# Patient Record
Sex: Female | Born: 1935 | Race: White | Hispanic: No | Smoking: Never smoker
Health system: Southern US, Community
[De-identification: ages and names within clinical notes are randomized; demographics above are authoritative.]

## PROBLEM LIST (undated history)

## (undated) DIAGNOSIS — E785 Hyperlipidemia, unspecified: Secondary | ICD-10-CM

## (undated) DIAGNOSIS — F319 Bipolar disorder, unspecified: Secondary | ICD-10-CM

## (undated) DIAGNOSIS — C50911 Malignant neoplasm of unspecified site of right female breast: Secondary | ICD-10-CM

## (undated) DIAGNOSIS — I480 Paroxysmal atrial fibrillation: Secondary | ICD-10-CM

## (undated) DIAGNOSIS — M545 Low back pain, unspecified: Secondary | ICD-10-CM

## (undated) DIAGNOSIS — G8929 Other chronic pain: Secondary | ICD-10-CM

## (undated) DIAGNOSIS — M1990 Unspecified osteoarthritis, unspecified site: Secondary | ICD-10-CM

## (undated) DIAGNOSIS — I10 Essential (primary) hypertension: Secondary | ICD-10-CM

## (undated) DIAGNOSIS — I509 Heart failure, unspecified: Secondary | ICD-10-CM

## (undated) HISTORY — PX: AUGMENTATION MAMMAPLASTY: SUR837

## (undated) HISTORY — PX: VAGINAL HYSTERECTOMY: SUR661

## (undated) HISTORY — PX: BREAST RECONSTRUCTION: SHX9

## (undated) HISTORY — PX: EXCISIONAL HEMORRHOIDECTOMY: SHX1541

## (undated) HISTORY — PX: BACK SURGERY: SHX140

## (undated) HISTORY — PX: CATARACT EXTRACTION W/ INTRAOCULAR LENS  IMPLANT, BILATERAL: SHX1307

## (undated) HISTORY — PX: MASTECTOMY: SHX3

## (undated) HISTORY — PX: BREAST BIOPSY: SHX20

## (undated) HISTORY — PX: LUMBAR DISC SURGERY: SHX700

## (undated) HISTORY — PX: TONSILLECTOMY: SUR1361

## (undated) HISTORY — PX: PLACEMENT OF BREAST IMPLANTS: SHX6334

---

## 2011-03-14 DIAGNOSIS — G319 Degenerative disease of nervous system, unspecified: Secondary | ICD-10-CM | POA: Diagnosis not present

## 2011-03-14 DIAGNOSIS — G4459 Other complicated headache syndrome: Secondary | ICD-10-CM | POA: Diagnosis not present

## 2011-03-18 DIAGNOSIS — I10 Essential (primary) hypertension: Secondary | ICD-10-CM | POA: Diagnosis not present

## 2011-03-18 DIAGNOSIS — G44009 Cluster headache syndrome, unspecified, not intractable: Secondary | ICD-10-CM | POA: Diagnosis not present

## 2011-03-18 DIAGNOSIS — G509 Disorder of trigeminal nerve, unspecified: Secondary | ICD-10-CM | POA: Diagnosis not present

## 2011-04-03 DIAGNOSIS — N3000 Acute cystitis without hematuria: Secondary | ICD-10-CM | POA: Diagnosis not present

## 2011-04-03 DIAGNOSIS — R1031 Right lower quadrant pain: Secondary | ICD-10-CM | POA: Diagnosis not present

## 2011-04-03 DIAGNOSIS — E0590 Thyrotoxicosis, unspecified without thyrotoxic crisis or storm: Secondary | ICD-10-CM | POA: Diagnosis not present

## 2011-04-03 DIAGNOSIS — N181 Chronic kidney disease, stage 1: Secondary | ICD-10-CM | POA: Diagnosis not present

## 2011-04-24 DIAGNOSIS — G509 Disorder of trigeminal nerve, unspecified: Secondary | ICD-10-CM | POA: Diagnosis not present

## 2011-04-24 DIAGNOSIS — G894 Chronic pain syndrome: Secondary | ICD-10-CM | POA: Diagnosis not present

## 2011-04-24 DIAGNOSIS — Z5181 Encounter for therapeutic drug level monitoring: Secondary | ICD-10-CM | POA: Diagnosis not present

## 2011-04-24 DIAGNOSIS — I10 Essential (primary) hypertension: Secondary | ICD-10-CM | POA: Diagnosis not present

## 2011-04-24 DIAGNOSIS — G44009 Cluster headache syndrome, unspecified, not intractable: Secondary | ICD-10-CM | POA: Diagnosis not present

## 2011-05-06 DIAGNOSIS — M48061 Spinal stenosis, lumbar region without neurogenic claudication: Secondary | ICD-10-CM | POA: Diagnosis not present

## 2011-05-06 DIAGNOSIS — M47817 Spondylosis without myelopathy or radiculopathy, lumbosacral region: Secondary | ICD-10-CM | POA: Diagnosis not present

## 2011-05-06 DIAGNOSIS — M4600 Spinal enthesopathy, site unspecified: Secondary | ICD-10-CM | POA: Diagnosis not present

## 2011-05-06 DIAGNOSIS — M533 Sacrococcygeal disorders, not elsewhere classified: Secondary | ICD-10-CM | POA: Diagnosis not present

## 2011-05-06 DIAGNOSIS — IMO0002 Reserved for concepts with insufficient information to code with codable children: Secondary | ICD-10-CM | POA: Diagnosis not present

## 2011-05-06 DIAGNOSIS — M961 Postlaminectomy syndrome, not elsewhere classified: Secondary | ICD-10-CM | POA: Diagnosis not present

## 2011-05-06 DIAGNOSIS — IMO0001 Reserved for inherently not codable concepts without codable children: Secondary | ICD-10-CM | POA: Diagnosis not present

## 2011-05-06 DIAGNOSIS — M62838 Other muscle spasm: Secondary | ICD-10-CM | POA: Diagnosis not present

## 2011-05-06 DIAGNOSIS — M259 Joint disorder, unspecified: Secondary | ICD-10-CM | POA: Diagnosis not present

## 2011-05-10 DIAGNOSIS — M5380 Other specified dorsopathies, site unspecified: Secondary | ICD-10-CM | POA: Diagnosis not present

## 2011-05-10 DIAGNOSIS — M5382 Other specified dorsopathies, cervical region: Secondary | ICD-10-CM | POA: Diagnosis not present

## 2011-05-10 DIAGNOSIS — M62838 Other muscle spasm: Secondary | ICD-10-CM | POA: Diagnosis not present

## 2011-05-10 DIAGNOSIS — M4600 Spinal enthesopathy, site unspecified: Secondary | ICD-10-CM | POA: Diagnosis not present

## 2011-05-10 DIAGNOSIS — M48061 Spinal stenosis, lumbar region without neurogenic claudication: Secondary | ICD-10-CM | POA: Diagnosis not present

## 2011-05-10 DIAGNOSIS — IMO0001 Reserved for inherently not codable concepts without codable children: Secondary | ICD-10-CM | POA: Diagnosis not present

## 2011-05-10 DIAGNOSIS — M47817 Spondylosis without myelopathy or radiculopathy, lumbosacral region: Secondary | ICD-10-CM | POA: Diagnosis not present

## 2011-05-10 DIAGNOSIS — M961 Postlaminectomy syndrome, not elsewhere classified: Secondary | ICD-10-CM | POA: Diagnosis not present

## 2011-05-22 DIAGNOSIS — M5137 Other intervertebral disc degeneration, lumbosacral region: Secondary | ICD-10-CM | POA: Diagnosis not present

## 2011-05-22 DIAGNOSIS — M519 Unspecified thoracic, thoracolumbar and lumbosacral intervertebral disc disorder: Secondary | ICD-10-CM | POA: Diagnosis not present

## 2011-05-22 DIAGNOSIS — M51379 Other intervertebral disc degeneration, lumbosacral region without mention of lumbar back pain or lower extremity pain: Secondary | ICD-10-CM | POA: Diagnosis not present

## 2011-05-22 DIAGNOSIS — IMO0002 Reserved for concepts with insufficient information to code with codable children: Secondary | ICD-10-CM | POA: Diagnosis not present

## 2011-05-22 DIAGNOSIS — M5430 Sciatica, unspecified side: Secondary | ICD-10-CM | POA: Diagnosis not present

## 2011-05-22 DIAGNOSIS — M47817 Spondylosis without myelopathy or radiculopathy, lumbosacral region: Secondary | ICD-10-CM | POA: Diagnosis not present

## 2011-05-22 DIAGNOSIS — M47814 Spondylosis without myelopathy or radiculopathy, thoracic region: Secondary | ICD-10-CM | POA: Diagnosis not present

## 2011-05-24 DIAGNOSIS — H35379 Puckering of macula, unspecified eye: Secondary | ICD-10-CM | POA: Diagnosis not present

## 2011-05-30 DIAGNOSIS — M5382 Other specified dorsopathies, cervical region: Secondary | ICD-10-CM | POA: Diagnosis not present

## 2011-05-30 DIAGNOSIS — M4802 Spinal stenosis, cervical region: Secondary | ICD-10-CM | POA: Diagnosis not present

## 2011-05-30 DIAGNOSIS — M961 Postlaminectomy syndrome, not elsewhere classified: Secondary | ICD-10-CM | POA: Diagnosis not present

## 2011-05-30 DIAGNOSIS — M47817 Spondylosis without myelopathy or radiculopathy, lumbosacral region: Secondary | ICD-10-CM | POA: Diagnosis not present

## 2011-06-01 DIAGNOSIS — J019 Acute sinusitis, unspecified: Secondary | ICD-10-CM | POA: Diagnosis not present

## 2011-06-12 DIAGNOSIS — M461 Sacroiliitis, not elsewhere classified: Secondary | ICD-10-CM | POA: Diagnosis not present

## 2011-06-12 DIAGNOSIS — M533 Sacrococcygeal disorders, not elsewhere classified: Secondary | ICD-10-CM | POA: Diagnosis not present

## 2011-06-12 DIAGNOSIS — M259 Joint disorder, unspecified: Secondary | ICD-10-CM | POA: Diagnosis not present

## 2011-06-12 DIAGNOSIS — M1610 Unilateral primary osteoarthritis, unspecified hip: Secondary | ICD-10-CM | POA: Diagnosis not present

## 2011-06-20 DIAGNOSIS — G519 Disorder of facial nerve, unspecified: Secondary | ICD-10-CM | POA: Diagnosis not present

## 2011-06-26 DIAGNOSIS — M259 Joint disorder, unspecified: Secondary | ICD-10-CM | POA: Diagnosis not present

## 2011-06-26 DIAGNOSIS — M48061 Spinal stenosis, lumbar region without neurogenic claudication: Secondary | ICD-10-CM | POA: Diagnosis not present

## 2011-06-26 DIAGNOSIS — M4802 Spinal stenosis, cervical region: Secondary | ICD-10-CM | POA: Diagnosis not present

## 2011-06-26 DIAGNOSIS — M4804 Spinal stenosis, thoracic region: Secondary | ICD-10-CM | POA: Diagnosis not present

## 2011-07-09 DIAGNOSIS — F331 Major depressive disorder, recurrent, moderate: Secondary | ICD-10-CM | POA: Diagnosis not present

## 2011-07-12 DIAGNOSIS — H526 Other disorders of refraction: Secondary | ICD-10-CM | POA: Diagnosis not present

## 2011-07-12 DIAGNOSIS — H532 Diplopia: Secondary | ICD-10-CM | POA: Diagnosis not present

## 2011-07-15 DIAGNOSIS — E782 Mixed hyperlipidemia: Secondary | ICD-10-CM | POA: Diagnosis not present

## 2011-07-15 DIAGNOSIS — J189 Pneumonia, unspecified organism: Secondary | ICD-10-CM | POA: Diagnosis not present

## 2011-07-15 DIAGNOSIS — I10 Essential (primary) hypertension: Secondary | ICD-10-CM | POA: Diagnosis not present

## 2011-07-15 DIAGNOSIS — F3011 Manic episode without psychotic symptoms, mild: Secondary | ICD-10-CM | POA: Diagnosis not present

## 2011-07-23 DIAGNOSIS — L03039 Cellulitis of unspecified toe: Secondary | ICD-10-CM | POA: Diagnosis not present

## 2011-08-13 DIAGNOSIS — H532 Diplopia: Secondary | ICD-10-CM | POA: Diagnosis not present

## 2011-08-14 DIAGNOSIS — J189 Pneumonia, unspecified organism: Secondary | ICD-10-CM | POA: Diagnosis not present

## 2011-08-14 DIAGNOSIS — I951 Orthostatic hypotension: Secondary | ICD-10-CM | POA: Diagnosis not present

## 2011-08-14 DIAGNOSIS — I10 Essential (primary) hypertension: Secondary | ICD-10-CM | POA: Diagnosis not present

## 2011-08-14 DIAGNOSIS — F319 Bipolar disorder, unspecified: Secondary | ICD-10-CM | POA: Diagnosis not present

## 2011-09-09 DIAGNOSIS — G509 Disorder of trigeminal nerve, unspecified: Secondary | ICD-10-CM | POA: Diagnosis not present

## 2011-09-09 DIAGNOSIS — G44009 Cluster headache syndrome, unspecified, not intractable: Secondary | ICD-10-CM | POA: Diagnosis not present

## 2011-09-09 DIAGNOSIS — R609 Edema, unspecified: Secondary | ICD-10-CM | POA: Diagnosis not present

## 2011-10-08 DIAGNOSIS — M47814 Spondylosis without myelopathy or radiculopathy, thoracic region: Secondary | ICD-10-CM | POA: Diagnosis not present

## 2011-10-08 DIAGNOSIS — T887XXA Unspecified adverse effect of drug or medicament, initial encounter: Secondary | ICD-10-CM | POA: Diagnosis not present

## 2011-10-08 DIAGNOSIS — R609 Edema, unspecified: Secondary | ICD-10-CM | POA: Diagnosis not present

## 2011-10-08 DIAGNOSIS — G20 Parkinson's disease: Secondary | ICD-10-CM | POA: Diagnosis not present

## 2011-10-29 DIAGNOSIS — L570 Actinic keratosis: Secondary | ICD-10-CM | POA: Diagnosis not present

## 2011-10-29 DIAGNOSIS — L821 Other seborrheic keratosis: Secondary | ICD-10-CM | POA: Diagnosis not present

## 2011-10-29 DIAGNOSIS — D485 Neoplasm of uncertain behavior of skin: Secondary | ICD-10-CM | POA: Diagnosis not present

## 2011-10-29 DIAGNOSIS — I10 Essential (primary) hypertension: Secondary | ICD-10-CM | POA: Diagnosis not present

## 2011-10-29 DIAGNOSIS — M47814 Spondylosis without myelopathy or radiculopathy, thoracic region: Secondary | ICD-10-CM | POA: Diagnosis not present

## 2011-10-29 DIAGNOSIS — G44009 Cluster headache syndrome, unspecified, not intractable: Secondary | ICD-10-CM | POA: Diagnosis not present

## 2011-11-08 DIAGNOSIS — I10 Essential (primary) hypertension: Secondary | ICD-10-CM | POA: Diagnosis not present

## 2011-11-08 DIAGNOSIS — I951 Orthostatic hypotension: Secondary | ICD-10-CM | POA: Diagnosis not present

## 2011-12-10 DIAGNOSIS — R269 Unspecified abnormalities of gait and mobility: Secondary | ICD-10-CM | POA: Diagnosis not present

## 2011-12-16 DIAGNOSIS — G44009 Cluster headache syndrome, unspecified, not intractable: Secondary | ICD-10-CM | POA: Diagnosis not present

## 2011-12-16 DIAGNOSIS — E782 Mixed hyperlipidemia: Secondary | ICD-10-CM | POA: Diagnosis not present

## 2011-12-16 DIAGNOSIS — I951 Orthostatic hypotension: Secondary | ICD-10-CM | POA: Diagnosis not present

## 2011-12-16 DIAGNOSIS — Z23 Encounter for immunization: Secondary | ICD-10-CM | POA: Diagnosis not present

## 2011-12-16 DIAGNOSIS — G509 Disorder of trigeminal nerve, unspecified: Secondary | ICD-10-CM | POA: Diagnosis not present

## 2011-12-20 DIAGNOSIS — G44009 Cluster headache syndrome, unspecified, not intractable: Secondary | ICD-10-CM | POA: Diagnosis not present

## 2011-12-20 DIAGNOSIS — R269 Unspecified abnormalities of gait and mobility: Secondary | ICD-10-CM | POA: Diagnosis not present

## 2011-12-20 DIAGNOSIS — M47814 Spondylosis without myelopathy or radiculopathy, thoracic region: Secondary | ICD-10-CM | POA: Diagnosis not present

## 2011-12-20 DIAGNOSIS — J309 Allergic rhinitis, unspecified: Secondary | ICD-10-CM | POA: Diagnosis not present

## 2011-12-20 DIAGNOSIS — M5126 Other intervertebral disc displacement, lumbar region: Secondary | ICD-10-CM | POA: Diagnosis not present

## 2011-12-25 DIAGNOSIS — Z87891 Personal history of nicotine dependence: Secondary | ICD-10-CM | POA: Diagnosis not present

## 2011-12-25 DIAGNOSIS — E876 Hypokalemia: Secondary | ICD-10-CM | POA: Diagnosis not present

## 2011-12-25 DIAGNOSIS — M4716 Other spondylosis with myelopathy, lumbar region: Secondary | ICD-10-CM | POA: Diagnosis not present

## 2011-12-25 DIAGNOSIS — IMO0002 Reserved for concepts with insufficient information to code with codable children: Secondary | ICD-10-CM | POA: Diagnosis not present

## 2011-12-25 DIAGNOSIS — I674 Hypertensive encephalopathy: Secondary | ICD-10-CM | POA: Diagnosis not present

## 2011-12-25 DIAGNOSIS — E785 Hyperlipidemia, unspecified: Secondary | ICD-10-CM | POA: Diagnosis present

## 2011-12-25 DIAGNOSIS — M810 Age-related osteoporosis without current pathological fracture: Secondary | ICD-10-CM | POA: Diagnosis not present

## 2011-12-25 DIAGNOSIS — G319 Degenerative disease of nervous system, unspecified: Secondary | ICD-10-CM | POA: Diagnosis not present

## 2011-12-25 DIAGNOSIS — Z853 Personal history of malignant neoplasm of breast: Secondary | ICD-10-CM | POA: Diagnosis not present

## 2011-12-25 DIAGNOSIS — F29 Unspecified psychosis not due to a substance or known physiological condition: Secondary | ICD-10-CM | POA: Diagnosis not present

## 2011-12-25 DIAGNOSIS — I10 Essential (primary) hypertension: Secondary | ICD-10-CM | POA: Diagnosis not present

## 2011-12-25 DIAGNOSIS — F0390 Unspecified dementia without behavioral disturbance: Secondary | ICD-10-CM | POA: Diagnosis not present

## 2011-12-25 DIAGNOSIS — F411 Generalized anxiety disorder: Secondary | ICD-10-CM | POA: Diagnosis present

## 2011-12-25 DIAGNOSIS — F319 Bipolar disorder, unspecified: Secondary | ICD-10-CM | POA: Diagnosis not present

## 2011-12-25 DIAGNOSIS — I129 Hypertensive chronic kidney disease with stage 1 through stage 4 chronic kidney disease, or unspecified chronic kidney disease: Secondary | ICD-10-CM | POA: Diagnosis not present

## 2011-12-29 DIAGNOSIS — I129 Hypertensive chronic kidney disease with stage 1 through stage 4 chronic kidney disease, or unspecified chronic kidney disease: Secondary | ICD-10-CM | POA: Diagnosis not present

## 2011-12-29 DIAGNOSIS — E876 Hypokalemia: Secondary | ICD-10-CM | POA: Diagnosis not present

## 2011-12-29 DIAGNOSIS — F0390 Unspecified dementia without behavioral disturbance: Secondary | ICD-10-CM | POA: Diagnosis not present

## 2011-12-29 DIAGNOSIS — M6281 Muscle weakness (generalized): Secondary | ICD-10-CM | POA: Diagnosis not present

## 2011-12-29 DIAGNOSIS — M4716 Other spondylosis with myelopathy, lumbar region: Secondary | ICD-10-CM | POA: Diagnosis not present

## 2011-12-30 DIAGNOSIS — M4716 Other spondylosis with myelopathy, lumbar region: Secondary | ICD-10-CM | POA: Diagnosis not present

## 2011-12-30 DIAGNOSIS — M6281 Muscle weakness (generalized): Secondary | ICD-10-CM | POA: Diagnosis not present

## 2011-12-30 DIAGNOSIS — I129 Hypertensive chronic kidney disease with stage 1 through stage 4 chronic kidney disease, or unspecified chronic kidney disease: Secondary | ICD-10-CM | POA: Diagnosis not present

## 2011-12-30 DIAGNOSIS — F0390 Unspecified dementia without behavioral disturbance: Secondary | ICD-10-CM | POA: Diagnosis not present

## 2011-12-30 DIAGNOSIS — E876 Hypokalemia: Secondary | ICD-10-CM | POA: Diagnosis not present

## 2011-12-31 DIAGNOSIS — M4716 Other spondylosis with myelopathy, lumbar region: Secondary | ICD-10-CM | POA: Diagnosis not present

## 2011-12-31 DIAGNOSIS — I129 Hypertensive chronic kidney disease with stage 1 through stage 4 chronic kidney disease, or unspecified chronic kidney disease: Secondary | ICD-10-CM | POA: Diagnosis not present

## 2011-12-31 DIAGNOSIS — M6281 Muscle weakness (generalized): Secondary | ICD-10-CM | POA: Diagnosis not present

## 2011-12-31 DIAGNOSIS — E876 Hypokalemia: Secondary | ICD-10-CM | POA: Diagnosis not present

## 2011-12-31 DIAGNOSIS — F0390 Unspecified dementia without behavioral disturbance: Secondary | ICD-10-CM | POA: Diagnosis not present

## 2012-01-01 DIAGNOSIS — M6281 Muscle weakness (generalized): Secondary | ICD-10-CM | POA: Diagnosis not present

## 2012-01-01 DIAGNOSIS — F0390 Unspecified dementia without behavioral disturbance: Secondary | ICD-10-CM | POA: Diagnosis not present

## 2012-01-01 DIAGNOSIS — I129 Hypertensive chronic kidney disease with stage 1 through stage 4 chronic kidney disease, or unspecified chronic kidney disease: Secondary | ICD-10-CM | POA: Diagnosis not present

## 2012-01-01 DIAGNOSIS — E876 Hypokalemia: Secondary | ICD-10-CM | POA: Diagnosis not present

## 2012-01-01 DIAGNOSIS — M4716 Other spondylosis with myelopathy, lumbar region: Secondary | ICD-10-CM | POA: Diagnosis not present

## 2012-01-02 DIAGNOSIS — M4716 Other spondylosis with myelopathy, lumbar region: Secondary | ICD-10-CM | POA: Diagnosis not present

## 2012-01-02 DIAGNOSIS — I129 Hypertensive chronic kidney disease with stage 1 through stage 4 chronic kidney disease, or unspecified chronic kidney disease: Secondary | ICD-10-CM | POA: Diagnosis not present

## 2012-01-02 DIAGNOSIS — F0390 Unspecified dementia without behavioral disturbance: Secondary | ICD-10-CM | POA: Diagnosis not present

## 2012-01-02 DIAGNOSIS — M6281 Muscle weakness (generalized): Secondary | ICD-10-CM | POA: Diagnosis not present

## 2012-01-02 DIAGNOSIS — E876 Hypokalemia: Secondary | ICD-10-CM | POA: Diagnosis not present

## 2012-01-06 DIAGNOSIS — I129 Hypertensive chronic kidney disease with stage 1 through stage 4 chronic kidney disease, or unspecified chronic kidney disease: Secondary | ICD-10-CM | POA: Diagnosis not present

## 2012-01-06 DIAGNOSIS — M6281 Muscle weakness (generalized): Secondary | ICD-10-CM | POA: Diagnosis not present

## 2012-01-06 DIAGNOSIS — F0390 Unspecified dementia without behavioral disturbance: Secondary | ICD-10-CM | POA: Diagnosis not present

## 2012-01-06 DIAGNOSIS — E876 Hypokalemia: Secondary | ICD-10-CM | POA: Diagnosis not present

## 2012-01-06 DIAGNOSIS — M4716 Other spondylosis with myelopathy, lumbar region: Secondary | ICD-10-CM | POA: Diagnosis not present

## 2012-01-07 DIAGNOSIS — E876 Hypokalemia: Secondary | ICD-10-CM | POA: Diagnosis not present

## 2012-01-07 DIAGNOSIS — M4716 Other spondylosis with myelopathy, lumbar region: Secondary | ICD-10-CM | POA: Diagnosis not present

## 2012-01-07 DIAGNOSIS — I10 Essential (primary) hypertension: Secondary | ICD-10-CM | POA: Diagnosis not present

## 2012-01-07 DIAGNOSIS — M6281 Muscle weakness (generalized): Secondary | ICD-10-CM | POA: Diagnosis not present

## 2012-01-07 DIAGNOSIS — R609 Edema, unspecified: Secondary | ICD-10-CM | POA: Diagnosis not present

## 2012-01-07 DIAGNOSIS — M47814 Spondylosis without myelopathy or radiculopathy, thoracic region: Secondary | ICD-10-CM | POA: Diagnosis not present

## 2012-01-07 DIAGNOSIS — G44009 Cluster headache syndrome, unspecified, not intractable: Secondary | ICD-10-CM | POA: Diagnosis not present

## 2012-01-07 DIAGNOSIS — M064 Inflammatory polyarthropathy: Secondary | ICD-10-CM | POA: Diagnosis not present

## 2012-01-07 DIAGNOSIS — F0390 Unspecified dementia without behavioral disturbance: Secondary | ICD-10-CM | POA: Diagnosis not present

## 2012-01-07 DIAGNOSIS — I129 Hypertensive chronic kidney disease with stage 1 through stage 4 chronic kidney disease, or unspecified chronic kidney disease: Secondary | ICD-10-CM | POA: Diagnosis not present

## 2012-01-08 DIAGNOSIS — E876 Hypokalemia: Secondary | ICD-10-CM | POA: Diagnosis not present

## 2012-01-08 DIAGNOSIS — M6281 Muscle weakness (generalized): Secondary | ICD-10-CM | POA: Diagnosis not present

## 2012-01-08 DIAGNOSIS — M4716 Other spondylosis with myelopathy, lumbar region: Secondary | ICD-10-CM | POA: Diagnosis not present

## 2012-01-08 DIAGNOSIS — I129 Hypertensive chronic kidney disease with stage 1 through stage 4 chronic kidney disease, or unspecified chronic kidney disease: Secondary | ICD-10-CM | POA: Diagnosis not present

## 2012-01-08 DIAGNOSIS — F0390 Unspecified dementia without behavioral disturbance: Secondary | ICD-10-CM | POA: Diagnosis not present

## 2012-01-09 DIAGNOSIS — M4716 Other spondylosis with myelopathy, lumbar region: Secondary | ICD-10-CM | POA: Diagnosis not present

## 2012-01-09 DIAGNOSIS — I129 Hypertensive chronic kidney disease with stage 1 through stage 4 chronic kidney disease, or unspecified chronic kidney disease: Secondary | ICD-10-CM | POA: Diagnosis not present

## 2012-01-09 DIAGNOSIS — E876 Hypokalemia: Secondary | ICD-10-CM | POA: Diagnosis not present

## 2012-01-09 DIAGNOSIS — M6281 Muscle weakness (generalized): Secondary | ICD-10-CM | POA: Diagnosis not present

## 2012-01-09 DIAGNOSIS — F0390 Unspecified dementia without behavioral disturbance: Secondary | ICD-10-CM | POA: Diagnosis not present

## 2012-01-10 DIAGNOSIS — E876 Hypokalemia: Secondary | ICD-10-CM | POA: Diagnosis not present

## 2012-01-10 DIAGNOSIS — M4716 Other spondylosis with myelopathy, lumbar region: Secondary | ICD-10-CM | POA: Diagnosis not present

## 2012-01-10 DIAGNOSIS — F0390 Unspecified dementia without behavioral disturbance: Secondary | ICD-10-CM | POA: Diagnosis not present

## 2012-01-10 DIAGNOSIS — I129 Hypertensive chronic kidney disease with stage 1 through stage 4 chronic kidney disease, or unspecified chronic kidney disease: Secondary | ICD-10-CM | POA: Diagnosis not present

## 2012-01-10 DIAGNOSIS — M6281 Muscle weakness (generalized): Secondary | ICD-10-CM | POA: Diagnosis not present

## 2012-01-13 DIAGNOSIS — E876 Hypokalemia: Secondary | ICD-10-CM | POA: Diagnosis not present

## 2012-01-13 DIAGNOSIS — M4716 Other spondylosis with myelopathy, lumbar region: Secondary | ICD-10-CM | POA: Diagnosis not present

## 2012-01-13 DIAGNOSIS — I129 Hypertensive chronic kidney disease with stage 1 through stage 4 chronic kidney disease, or unspecified chronic kidney disease: Secondary | ICD-10-CM | POA: Diagnosis not present

## 2012-01-13 DIAGNOSIS — M6281 Muscle weakness (generalized): Secondary | ICD-10-CM | POA: Diagnosis not present

## 2012-01-13 DIAGNOSIS — F0390 Unspecified dementia without behavioral disturbance: Secondary | ICD-10-CM | POA: Diagnosis not present

## 2012-01-17 DIAGNOSIS — M6281 Muscle weakness (generalized): Secondary | ICD-10-CM | POA: Diagnosis not present

## 2012-01-17 DIAGNOSIS — E876 Hypokalemia: Secondary | ICD-10-CM | POA: Diagnosis not present

## 2012-01-17 DIAGNOSIS — I129 Hypertensive chronic kidney disease with stage 1 through stage 4 chronic kidney disease, or unspecified chronic kidney disease: Secondary | ICD-10-CM | POA: Diagnosis not present

## 2012-01-17 DIAGNOSIS — M4716 Other spondylosis with myelopathy, lumbar region: Secondary | ICD-10-CM | POA: Diagnosis not present

## 2012-01-17 DIAGNOSIS — F0390 Unspecified dementia without behavioral disturbance: Secondary | ICD-10-CM | POA: Diagnosis not present

## 2012-01-20 DIAGNOSIS — M6281 Muscle weakness (generalized): Secondary | ICD-10-CM | POA: Diagnosis not present

## 2012-01-20 DIAGNOSIS — F0390 Unspecified dementia without behavioral disturbance: Secondary | ICD-10-CM | POA: Diagnosis not present

## 2012-01-20 DIAGNOSIS — E876 Hypokalemia: Secondary | ICD-10-CM | POA: Diagnosis not present

## 2012-01-20 DIAGNOSIS — M4716 Other spondylosis with myelopathy, lumbar region: Secondary | ICD-10-CM | POA: Diagnosis not present

## 2012-01-20 DIAGNOSIS — I129 Hypertensive chronic kidney disease with stage 1 through stage 4 chronic kidney disease, or unspecified chronic kidney disease: Secondary | ICD-10-CM | POA: Diagnosis not present

## 2012-01-23 DIAGNOSIS — I129 Hypertensive chronic kidney disease with stage 1 through stage 4 chronic kidney disease, or unspecified chronic kidney disease: Secondary | ICD-10-CM | POA: Diagnosis not present

## 2012-01-23 DIAGNOSIS — M6281 Muscle weakness (generalized): Secondary | ICD-10-CM | POA: Diagnosis not present

## 2012-01-23 DIAGNOSIS — M4716 Other spondylosis with myelopathy, lumbar region: Secondary | ICD-10-CM | POA: Diagnosis not present

## 2012-01-23 DIAGNOSIS — E876 Hypokalemia: Secondary | ICD-10-CM | POA: Diagnosis not present

## 2012-01-23 DIAGNOSIS — F0390 Unspecified dementia without behavioral disturbance: Secondary | ICD-10-CM | POA: Diagnosis not present

## 2012-04-10 DIAGNOSIS — M47814 Spondylosis without myelopathy or radiculopathy, thoracic region: Secondary | ICD-10-CM | POA: Diagnosis not present

## 2012-04-10 DIAGNOSIS — I10 Essential (primary) hypertension: Secondary | ICD-10-CM | POA: Diagnosis not present

## 2012-04-10 DIAGNOSIS — G509 Disorder of trigeminal nerve, unspecified: Secondary | ICD-10-CM | POA: Diagnosis not present

## 2012-04-10 DIAGNOSIS — M1710 Unilateral primary osteoarthritis, unspecified knee: Secondary | ICD-10-CM | POA: Diagnosis not present

## 2012-04-10 DIAGNOSIS — I951 Orthostatic hypotension: Secondary | ICD-10-CM | POA: Diagnosis not present

## 2012-04-21 DIAGNOSIS — R609 Edema, unspecified: Secondary | ICD-10-CM | POA: Diagnosis not present

## 2012-04-21 DIAGNOSIS — M47814 Spondylosis without myelopathy or radiculopathy, thoracic region: Secondary | ICD-10-CM | POA: Diagnosis not present

## 2012-04-21 DIAGNOSIS — R269 Unspecified abnormalities of gait and mobility: Secondary | ICD-10-CM | POA: Diagnosis not present

## 2012-04-21 DIAGNOSIS — M1710 Unilateral primary osteoarthritis, unspecified knee: Secondary | ICD-10-CM | POA: Diagnosis not present

## 2012-04-21 DIAGNOSIS — I951 Orthostatic hypotension: Secondary | ICD-10-CM | POA: Diagnosis not present

## 2012-06-03 DIAGNOSIS — M5126 Other intervertebral disc displacement, lumbar region: Secondary | ICD-10-CM | POA: Diagnosis not present

## 2012-06-03 DIAGNOSIS — Z111 Encounter for screening for respiratory tuberculosis: Secondary | ICD-10-CM | POA: Diagnosis not present

## 2012-06-03 DIAGNOSIS — M810 Age-related osteoporosis without current pathological fracture: Secondary | ICD-10-CM | POA: Diagnosis not present

## 2012-06-03 DIAGNOSIS — I10 Essential (primary) hypertension: Secondary | ICD-10-CM | POA: Diagnosis not present

## 2012-06-03 DIAGNOSIS — G44009 Cluster headache syndrome, unspecified, not intractable: Secondary | ICD-10-CM | POA: Diagnosis not present

## 2012-06-03 DIAGNOSIS — R609 Edema, unspecified: Secondary | ICD-10-CM | POA: Diagnosis not present

## 2012-06-03 DIAGNOSIS — R269 Unspecified abnormalities of gait and mobility: Secondary | ICD-10-CM | POA: Diagnosis not present

## 2012-06-05 DIAGNOSIS — H6120 Impacted cerumen, unspecified ear: Secondary | ICD-10-CM | POA: Diagnosis not present

## 2012-07-29 DIAGNOSIS — M7750 Other enthesopathy of unspecified foot: Secondary | ICD-10-CM | POA: Diagnosis not present

## 2012-07-29 DIAGNOSIS — M722 Plantar fascial fibromatosis: Secondary | ICD-10-CM | POA: Diagnosis not present

## 2012-08-05 DIAGNOSIS — M722 Plantar fascial fibromatosis: Secondary | ICD-10-CM | POA: Diagnosis not present

## 2012-08-05 DIAGNOSIS — L600 Ingrowing nail: Secondary | ICD-10-CM | POA: Diagnosis not present

## 2012-08-18 ENCOUNTER — Telehealth: Payer: Self-pay | Admitting: Internal Medicine

## 2012-08-18 NOTE — Telephone Encounter (Signed)
Ok but again would refer to [facility] or Dr Selena Batten if they have availability/interest

## 2012-08-18 NOTE — Telephone Encounter (Signed)
Pt is new to area, from [place], [place]. Just moved here 2 weeks ago.  Would like to know if you will accept as a new patient? Pt has [employer], supplemental [employer].

## 2012-08-28 ENCOUNTER — Ambulatory Visit: Payer: Medicare Other

## 2012-08-31 DIAGNOSIS — M722 Plantar fascial fibromatosis: Secondary | ICD-10-CM | POA: Diagnosis not present

## 2012-09-14 ENCOUNTER — Encounter: Payer: Self-pay | Admitting: Internal Medicine

## 2012-09-14 ENCOUNTER — Ambulatory Visit (INDEPENDENT_AMBULATORY_CARE_PROVIDER_SITE_OTHER): Payer: Medicare Other | Admitting: Internal Medicine

## 2012-09-14 VITALS — BP 130/70 | HR 61 | Temp 97.4°F | Resp 18 | Ht 65.5 in | Wt 156.0 lb

## 2012-09-14 DIAGNOSIS — Z0000 Encounter for general adult medical examination without abnormal findings: Secondary | ICD-10-CM | POA: Diagnosis not present

## 2012-09-14 DIAGNOSIS — F319 Bipolar disorder, unspecified: Secondary | ICD-10-CM

## 2012-09-14 DIAGNOSIS — M545 Low back pain, unspecified: Secondary | ICD-10-CM

## 2012-09-14 DIAGNOSIS — G8929 Other chronic pain: Secondary | ICD-10-CM | POA: Diagnosis not present

## 2012-09-14 DIAGNOSIS — M549 Dorsalgia, unspecified: Secondary | ICD-10-CM

## 2012-09-14 MED ORDER — FUROSEMIDE 40 MG PO TABS: 40.0000 mg | ORAL_TABLET | Freq: Every day | ORAL | Status: DC

## 2012-09-14 MED ORDER — GABAPENTIN 300 MG PO CAPS: 300.0000 mg | ORAL_CAPSULE | Freq: Two times a day (BID) | ORAL | Status: DC

## 2012-09-14 MED ORDER — ESCITALOPRAM OXALATE 10 MG PO TABS: 10.0000 mg | ORAL_TABLET | Freq: Every day | ORAL | Status: DC

## 2012-09-14 MED ORDER — NABUMETONE 750 MG PO TABS: 750.0000 mg | ORAL_TABLET | Freq: Two times a day (BID) | ORAL | Status: DC

## 2012-09-14 MED ORDER — TRAMADOL HCL 50 MG PO TABS: 100.0000 mg | ORAL_TABLET | Freq: Three times a day (TID) | ORAL | Status: DC | PRN

## 2012-09-14 MED ORDER — QUETIAPINE FUMARATE 25 MG PO TABS: 25.0000 mg | ORAL_TABLET | Freq: Three times a day (TID) | ORAL | Status: DC

## 2012-09-14 MED ORDER — TRAZODONE HCL 150 MG PO TABS: 150.0000 mg | ORAL_TABLET | Freq: Every day | ORAL | Status: DC

## 2012-09-14 MED ORDER — ESTRADIOL 1 MG PO TABS: 1.0000 mg | ORAL_TABLET | Freq: Every day | ORAL | Status: DC

## 2012-09-14 NOTE — Progress Notes (Signed)
Subjective:    Patient ID: Tracy Rodriguez, female    DOB: 12/06/1931, 79 y.o.   MRN: [id number]  HPI   79 year old patient who is seen today to set [place] with our practice. She is recently widowed about 4 months ago and has relocated to [place]  from [facility].  Past medical history  Remote breast cancer status post partial mastectomy 1981 Hysterectomy 1971 Cataract extraction surgery Chronic low back pain status post decompression 1972 1973, 1985  allergic rhinitis Bipolar disorder History of plantar fasciitis  Allergies sulfa and codeine  Social history widowed retired worked with the [employer] 2 daughters one grandson Relocated from [place] Nonsmoker  Family history father died at 47 [place] Mother died age 7 status post CVA history of breast cancer at 65 One brother died at 30 of leukemia One sister is well  1. Risk factors, based on past  M,S,F history- no significant cardiovascular risk factors except age   2.  Physical activities: Chronic low back pain but no activity restrictions. Walks unassisted  3.  Depression/mood: History bipolar depression  4.  Hearing: No deficits  5.  ADL's: Requires assistance in aspects of daily living. Presently resides at a rest home  6.  Fall risk: Low  7.  Home safety: No problems identified  8.  Height weight, and visual acuity; height and weight stable no change in visual acuity has had bilateral cataract extraction surgery  9.  Counseling: Heart healthy diet rectus eyes encouraged  10. Lab orders based on risk factors: Will review of prior medical records  11. Referral : Not appropriate at this time  12. Care plan: Continue present regimen reviewed the past medical records  13. Cognitive assessment: Alert in order with normal affect. No cognitive dysfunction     Review of Systems  Constitutional: Negative for fever, appetite change, fatigue and unexpected weight change.  HENT: Negative for  hearing loss, ear pain, nosebleeds, congestion, sore throat, mouth sores, trouble swallowing, neck stiffness, dental problem, voice change, sinus pressure and tinnitus.   Eyes: Negative for photophobia, pain, redness and visual disturbance.  Respiratory: Negative for cough, chest tightness and shortness of breath.   Cardiovascular: Negative for chest pain, palpitations and leg swelling.  Gastrointestinal: Negative for nausea, vomiting, abdominal pain, diarrhea, constipation, blood in stool, abdominal distention and rectal pain.  Genitourinary: Negative for dysuria, urgency, frequency, hematuria, flank pain, vaginal bleeding, vaginal discharge, difficulty urinating, genital sores, vaginal pain, menstrual problem and pelvic pain.  Musculoskeletal: Positive for back pain, arthralgias and gait problem.       Right plantar fasciitis  Skin: Negative for rash.  Neurological: Negative for dizziness, syncope, speech difficulty, weakness, light-headedness, numbness and headaches.  Hematological: Negative for adenopathy. Does not bruise/bleed easily.  Psychiatric/Behavioral: Negative for suicidal ideas, behavioral problems, self-injury, dysphoric mood and agitation. The patient is not nervous/anxious.        Objective:   Physical Exam  Constitutional: She is oriented to person, place, and time. She appears well-developed and well-nourished.  HENT:  Head: Normocephalic.  Right Ear: External ear normal.  Left Ear: External ear normal.  Mouth/Throat: Oropharynx is clear and moist.  Eyes: Conjunctivae and EOM are normal. Pupils are equal, round, and reactive to light.  Neck: Normal range of motion. Neck supple. No thyromegaly present.  Cardiovascular: Normal rate, regular rhythm, normal heart sounds and intact distal pulses.   Pulmonary/Chest: Effort normal and breath sounds normal.  Abdominal: Soft. Bowel sounds are normal. She exhibits no  mass. There is no tenderness.  Musculoskeletal: Normal range of  motion.  Lymphadenopathy:    She has no cervical adenopathy.  Neurological: She is alert and oriented to person, place, and time.  Skin: Skin is warm and dry. No rash noted.  Psychiatric: She has a normal mood and affect. Her behavior is normal.          Assessment & Plan:  Preventive health exam Bipolar disorder History menopausal syndrome. We'll attempt to taper and discontinue hormone replacement therapy Chronic low back pain. Medications refilled

## 2012-09-14 NOTE — Patient Instructions (Addendum)
Limit your sodium (Salt) intake  Taper and discontinue Estrace  It is important that you exercise regularly, at least 20 minutes 3 to 4 times per week.  If you develop chest pain or shortness of breath seek  medical attention.  Return in 6 months for follow-up

## 2012-09-24 DIAGNOSIS — M722 Plantar fascial fibromatosis: Secondary | ICD-10-CM | POA: Diagnosis not present

## 2012-10-23 ENCOUNTER — Ambulatory Visit (INDEPENDENT_AMBULATORY_CARE_PROVIDER_SITE_OTHER): Payer: Medicare Other | Admitting: Internal Medicine

## 2012-10-23 ENCOUNTER — Encounter: Payer: Self-pay | Admitting: Internal Medicine

## 2012-10-23 VITALS — BP 140/80 | HR 61 | Temp 97.7°F | Resp 20 | Wt 160.0 lb

## 2012-10-23 DIAGNOSIS — R197 Diarrhea, unspecified: Secondary | ICD-10-CM

## 2012-10-23 MED ORDER — DIPHENOXYLATE-ATROPINE 2.5-0.025 MG PO TABS: 1.0000 | ORAL_TABLET | Freq: Four times a day (QID) | ORAL | Status: DC | PRN

## 2012-10-23 NOTE — Progress Notes (Signed)
Subjective:    Patient ID: Tracy Rodriguez, female    DOB: 14-Jun-1931, 79 y.o.   MRN: [id number]  HPI  79 year old patient who is seen today with a chief complaint of diarrhea. The past 2 weeks she has had loose stools that occur about 4 times per day. No nausea vomiting abdominal pain anorexia fever or other constitutional complaints. There has been no weight loss. She describes the diarrhea as being a nuisance.   History reviewed. No pertinent past medical history.  History   Social History  . Marital Status: Widowed    Spouse Name: N/A    Number of Children: N/A  . Years of Education: N/A   Occupational History  . Not on file.   Social History Main Topics  . Smoking status: Never Smoker   . Smokeless tobacco: Never Used  . Alcohol Use: No  . Drug Use: No  . Sexual Activity: Not on file   Other Topics Concern  . Not on file   Social History Narrative  . No narrative on file    History reviewed. No pertinent past surgical history.  No family history on file.  Allergies  Allergen Reactions  . Codeine Anaphylaxis  . Sulfa Antibiotics Rash    Current Outpatient Prescriptions on File Prior to Visit  Medication Sig Dispense Refill  . escitalopram (LEXAPRO) 10 MG tablet Take 1 tablet (10 mg total) by mouth daily.  90 tablet  1  . estradiol (ESTRACE) 1 MG tablet Take 1 tablet (1 mg total) by mouth daily.  90 tablet  3  . furosemide (LASIX) 40 MG tablet Take 1 tablet (40 mg total) by mouth daily.  90 tablet  3  . gabapentin (NEURONTIN) 300 MG capsule Take 1 capsule (300 mg total) by mouth 2 (two) times daily.  180 capsule  3  . nabumetone (RELAFEN) 750 MG tablet Take 1 tablet (750 mg total) by mouth 2 (two) times daily.  180 tablet  3  . pseudoephedrine (SUDAFED) 30 MG tablet Take 30 mg by mouth as needed for congestion.      . QUEtiapine (SEROQUEL) 25 MG tablet Take 1 tablet (25 mg total) by mouth 3 (three) times daily.  270 tablet  3  . traMADol (ULTRAM) 50 MG tablet Take  2 tablets (100 mg total) by mouth every 8 (eight) hours as needed.  90 tablet  1  . traZODone (DESYREL) 150 MG tablet Take 1 tablet (150 mg total) by mouth at bedtime.  90 tablet  1   No current facility-administered medications on file prior to visit.    BP 140/80  Pulse 61  Temp(Src) 97.7 F (36.5 C) (Oral)  Resp 20  Wt 160 lb (72.576 kg)  BMI 26.21 kg/m2  SpO2 95%  LMP 10/10/1969     Wt Readings from Last 3 Encounters:  10/23/12 160 lb (72.576 kg)  09/14/12 156 lb (70.761 kg)    Review of Systems  Constitutional: Negative.   HENT: Negative for hearing loss, congestion, sore throat, rhinorrhea, dental problem, sinus pressure and tinnitus.   Eyes: Negative for pain, discharge and visual disturbance.  Respiratory: Negative for cough and shortness of breath.   Cardiovascular: Negative for chest pain, palpitations and leg swelling.  Gastrointestinal: Positive for diarrhea. Negative for nausea, vomiting, abdominal pain, constipation, blood in stool and abdominal distention.  Genitourinary: Negative for dysuria, urgency, frequency, hematuria, flank pain, vaginal bleeding, vaginal discharge, difficulty urinating, vaginal pain and pelvic pain.  Musculoskeletal: Negative for joint swelling, arthralgias  and gait problem.  Skin: Negative for rash.  Neurological: Negative for dizziness, syncope, speech difficulty, weakness, numbness and headaches.  Hematological: Negative for adenopathy.  Psychiatric/Behavioral: Negative for behavioral problems, dysphoric mood and agitation. The patient is not nervous/anxious.        Objective:   Physical Exam  Constitutional: She is oriented to person, place, and time. She appears well-developed and well-nourished.  HENT:  Head: Normocephalic.  Right Ear: External ear normal.  Left Ear: External ear normal.  Mouth/Throat: Oropharynx is clear and moist.  Eyes: Conjunctivae and EOM are normal. Pupils are equal, round, and reactive to light.   Neck: Normal range of motion. Neck supple. No thyromegaly present.  Cardiovascular: Normal rate, regular rhythm, normal heart sounds and intact distal pulses.   Pulmonary/Chest: Effort normal and breath sounds normal.  Abdominal: Soft. Bowel sounds are normal. She exhibits no distension and no mass. There is no tenderness. There is no rebound and no guarding.  Musculoskeletal: Normal range of motion.  Lymphadenopathy:    She has no cervical adenopathy.  Neurological: She is alert and oriented to person, place, and time.  Skin: Skin is warm and dry. No rash noted.  Psychiatric: She has a normal mood and affect. Her behavior is normal.          Assessment & Plan:  Diarrhea Normal clinical exam  We'll treat symptomatically with Lomotil. Will increase fiber in her diet and add a probiotic for one week. She'll call if unimproved

## 2012-10-23 NOTE — Patient Instructions (Signed)
Metamucil supplement twice daily  Align  one daily  Antidiarrheal as directed

## 2012-11-05 ENCOUNTER — Other Ambulatory Visit: Payer: Self-pay | Admitting: Internal Medicine

## 2012-11-20 ENCOUNTER — Ambulatory Visit (INDEPENDENT_AMBULATORY_CARE_PROVIDER_SITE_OTHER): Payer: Medicare Other | Admitting: Internal Medicine

## 2012-11-20 ENCOUNTER — Encounter: Payer: Self-pay | Admitting: Internal Medicine

## 2012-11-20 ENCOUNTER — Ambulatory Visit (INDEPENDENT_AMBULATORY_CARE_PROVIDER_SITE_OTHER)
Admission: RE | Admit: 2012-11-20 | Discharge: 2012-11-20 | Disposition: A | Discharge status: Home / Self Care | Payer: Medicare Other | Source: Ambulatory Visit | Attending: Internal Medicine | Admitting: Internal Medicine

## 2012-11-20 VITALS — BP 154/80 | HR 65 | Temp 97.3°F | Resp 20 | Wt 164.0 lb

## 2012-11-20 DIAGNOSIS — R609 Edema, unspecified: Secondary | ICD-10-CM

## 2012-11-20 DIAGNOSIS — R0602 Shortness of breath: Secondary | ICD-10-CM

## 2012-11-20 DIAGNOSIS — G8929 Other chronic pain: Secondary | ICD-10-CM | POA: Diagnosis not present

## 2012-11-20 DIAGNOSIS — R0989 Other specified symptoms and signs involving the circulatory and respiratory systems: Secondary | ICD-10-CM | POA: Diagnosis not present

## 2012-11-20 DIAGNOSIS — M549 Dorsalgia, unspecified: Secondary | ICD-10-CM | POA: Diagnosis not present

## 2012-11-20 LAB — CBC WITH DIFFERENTIAL/PLATELET
Basophils Absolute: 0 10*3/uL (ref 0.0–0.1)
Basophils Relative: 0.4 % (ref 0.0–3.0)
Eosinophils Absolute: 0.2 10*3/uL (ref 0.0–0.7)
Eosinophils Relative: 3.2 % (ref 0.0–5.0)
HCT: 34.7 % — ABNORMAL LOW (ref 36.0–46.0)
Hemoglobin: 11.5 g/dL — ABNORMAL LOW (ref 12.0–15.0)
Lymphocytes Relative: 18.1 % (ref 12.0–46.0)
Lymphs Abs: 1.1 10*3/uL (ref 0.7–4.0)
MCHC: 33.2 g/dL (ref 30.0–36.0)
MCV: 88.7 fl (ref 78.0–100.0)
Monocytes Absolute: 0.6 10*3/uL (ref 0.1–1.0)
Monocytes Relative: 9.7 % (ref 3.0–12.0)
Neutro Abs: 4.1 10*3/uL (ref 1.4–7.7)
Neutrophils Relative %: 68.6 % (ref 43.0–77.0)
Platelets: 185 10*3/uL (ref 150.0–400.0)
RBC: 3.91 Mil/uL (ref 3.87–5.11)
RDW: 14.6 % (ref 11.5–14.6)
WBC: 6 10*3/uL (ref 4.5–10.5)

## 2012-11-20 LAB — COMPREHENSIVE METABOLIC PANEL
ALT: 12 U/L (ref 0–35)
AST: 21 U/L (ref 0–37)
Albumin: 3.4 g/dL — ABNORMAL LOW (ref 3.5–5.2)
Alkaline Phosphatase: 72 U/L (ref 39–117)
BUN: 25 mg/dL — ABNORMAL HIGH (ref 6–23)
CO2: 29 mEq/L (ref 19–32)
Calcium: 9.1 mg/dL (ref 8.4–10.5)
Chloride: 106 mEq/L (ref 96–112)
Creatinine, Ser: 1.4 mg/dL — ABNORMAL HIGH (ref 0.4–1.2)
GFR: 37.87 mL/min — ABNORMAL LOW (ref >60.00)
Glucose, Bld: 73 mg/dL (ref 70–99)
Potassium: 3.6 mEq/L (ref 3.5–5.1)
Sodium: 140 mEq/L (ref 135–145)
Total Bilirubin: 1.2 mg/dL (ref 0.3–1.2)
Total Protein: 6.8 g/dL (ref 6.0–8.3)

## 2012-11-20 LAB — TSH: TSH: 2.55 u[IU]/mL (ref 0.35–5.50)

## 2012-11-20 LAB — BRAIN NATRIURETIC PEPTIDE: Pro B Natriuretic peptide (BNP): 416 pg/mL — ABNORMAL HIGH (ref 0.0–100.0)

## 2012-11-20 NOTE — Patient Instructions (Addendum)
Limit your sodium (Salt) intake  Call or return to clinic prn if these symptoms worsen or fail to improve as anticipated.   

## 2012-11-20 NOTE — Progress Notes (Signed)
Subjective:    Patient ID: Tracy Rodriguez, female    DOB: 04-29-1931, 79 y.o.   MRN: [id number]  HPI  79 year old patient who is seen today for followup. Past 4 weeks she has had some increasing intermittent pedal edema. She has been on chronic Lasix and this has also been a chronic problem. For the past 3 weeks there has been more shortness of breath. She states this occurs usually when she takes a nap and lies flat following lunch. She has a history chronic low back pain and no history of cardiac disease.  Wt Readings from Last 3 Encounters:  11/20/12 164 lb (74.39 kg)  10/23/12 160 lb (72.576 kg)  09/14/12 156 lb (70.761 kg)    History reviewed. No pertinent past medical history.  History   Social History  . Marital Status: Widowed    Spouse Name: N/A    Number of Children: N/A  . Years of Education: N/A   Occupational History  . Not on file.   Social History Main Topics  . Smoking status: Never Smoker   . Smokeless tobacco: Never Used  . Alcohol Use: No  . Drug Use: No  . Sexual Activity: Not on file   Other Topics Concern  . Not on file   Social History Narrative  . No narrative on file    History reviewed. No pertinent past surgical history.  No family history on file.  Allergies  Allergen Reactions  . Codeine Anaphylaxis  . Sulfa Antibiotics Rash    Current Outpatient Prescriptions on File Prior to Visit  Medication Sig Dispense Refill  . diphenoxylate-atropine (LOMOTIL) 2.5-0.025 MG per tablet Take 1 tablet by mouth 4 (four) times daily as needed for diarrhea or loose stools.  30 tablet  0  . escitalopram (LEXAPRO) 10 MG tablet Take 1 tablet (10 mg total) by mouth daily.  90 tablet  1  . furosemide (LASIX) 40 MG tablet Take 1 tablet (40 mg total) by mouth daily.  90 tablet  3  . gabapentin (NEURONTIN) 300 MG capsule Take 1 capsule (300 mg total) by mouth 2 (two) times daily.  180 capsule  3  . nabumetone (RELAFEN) 750 MG tablet Take 1 tablet (750 mg  total) by mouth 2 (two) times daily.  180 tablet  3  . pseudoephedrine (SUDAFED) 30 MG tablet Take 30 mg by mouth as needed for congestion.      . QUEtiapine (SEROQUEL) 25 MG tablet Take 1 tablet (25 mg total) by mouth 3 (three) times daily.  270 tablet  3  . traMADol (ULTRAM) 50 MG tablet TAKE 2 TABLETS BY MOUTH EVERY 8 HOURS AS NEEDED  90 tablet  1  . traZODone (DESYREL) 150 MG tablet Take 1 tablet (150 mg total) by mouth at bedtime.  90 tablet  1   No current facility-administered medications on file prior to visit.    BP 154/80  Pulse 65  Temp(Src) 97.3 F (36.3 C) (Oral)  Resp 20  Wt 164 lb (74.39 kg)  BMI 26.87 kg/m2  SpO2 97%  LMP 10/10/1969     Review of Systems  Constitutional: Negative.   HENT: Negative for hearing loss, congestion, sore throat, rhinorrhea, dental problem, sinus pressure and tinnitus.   Eyes: Negative for pain, discharge and visual disturbance.  Respiratory: Positive for shortness of breath. Negative for cough.   Cardiovascular: Positive for leg swelling. Negative for chest pain and palpitations.  Gastrointestinal: Negative for nausea, vomiting, abdominal pain, diarrhea, constipation, blood in stool  and abdominal distention.  Genitourinary: Negative for dysuria, urgency, frequency, hematuria, flank pain, vaginal bleeding, vaginal discharge, difficulty urinating, vaginal pain and pelvic pain.  Musculoskeletal: Negative for joint swelling, arthralgias and gait problem.  Skin: Negative for rash.  Neurological: Negative for dizziness, syncope, speech difficulty, weakness, numbness and headaches.  Hematological: Negative for adenopathy.  Psychiatric/Behavioral: Negative for behavioral problems, dysphoric mood and agitation. The patient is not nervous/anxious.        Objective:   Physical Exam  Constitutional: She is oriented to person, place, and time. She appears well-developed and well-nourished.  HENT:  Head: Normocephalic.  Right Ear: External ear  normal.  Left Ear: External ear normal.  Mouth/Throat: Oropharynx is clear and moist.  Eyes: Conjunctivae and EOM are normal. Pupils are equal, round, and reactive to light.  Neck: Normal range of motion. Neck supple. No thyromegaly present.  Cardiovascular: Normal rate, regular rhythm, normal heart sounds and intact distal pulses.   Pulse slow and regular No murmur or gallop  Pulmonary/Chest: Effort normal and breath sounds normal.  O2 saturation 97  Abdominal: Soft. Bowel sounds are normal. She exhibits no mass. There is no tenderness.  Musculoskeletal: Normal range of motion. She exhibits no edema.  No significant pedal edema at this late morning appointment  Lymphadenopathy:    She has no cervical adenopathy.  Neurological: She is alert and oriented to person, place, and time.  Skin: Skin is warm and dry. No rash noted.  Psychiatric: She has a normal mood and affect. Her behavior is normal.          Assessment & Plan:   History shortness of breath and worsening pedal edema Weight gain Nothing  to support a diagnosis of heart failure on clinical exam. We'll check screening labs chest x-ray and BNP. Low-salt diet recommended Chronic low back pain

## 2012-11-24 DIAGNOSIS — R279 Unspecified lack of coordination: Secondary | ICD-10-CM | POA: Diagnosis not present

## 2012-11-24 DIAGNOSIS — M6281 Muscle weakness (generalized): Secondary | ICD-10-CM | POA: Diagnosis not present

## 2012-11-26 DIAGNOSIS — R279 Unspecified lack of coordination: Secondary | ICD-10-CM | POA: Diagnosis not present

## 2012-11-26 DIAGNOSIS — M6281 Muscle weakness (generalized): Secondary | ICD-10-CM | POA: Diagnosis not present

## 2012-11-27 ENCOUNTER — Other Ambulatory Visit: Payer: Self-pay | Admitting: Internal Medicine

## 2012-11-30 DIAGNOSIS — M6281 Muscle weakness (generalized): Secondary | ICD-10-CM | POA: Diagnosis not present

## 2012-11-30 DIAGNOSIS — R279 Unspecified lack of coordination: Secondary | ICD-10-CM | POA: Diagnosis not present

## 2012-12-02 DIAGNOSIS — R279 Unspecified lack of coordination: Secondary | ICD-10-CM | POA: Diagnosis not present

## 2012-12-02 DIAGNOSIS — M6281 Muscle weakness (generalized): Secondary | ICD-10-CM | POA: Diagnosis not present

## 2012-12-04 DIAGNOSIS — R279 Unspecified lack of coordination: Secondary | ICD-10-CM | POA: Diagnosis not present

## 2012-12-04 DIAGNOSIS — M6281 Muscle weakness (generalized): Secondary | ICD-10-CM | POA: Diagnosis not present

## 2012-12-07 DIAGNOSIS — R279 Unspecified lack of coordination: Secondary | ICD-10-CM | POA: Diagnosis not present

## 2012-12-07 DIAGNOSIS — M6281 Muscle weakness (generalized): Secondary | ICD-10-CM | POA: Diagnosis not present

## 2012-12-08 DIAGNOSIS — R279 Unspecified lack of coordination: Secondary | ICD-10-CM | POA: Diagnosis not present

## 2012-12-08 DIAGNOSIS — M6281 Muscle weakness (generalized): Secondary | ICD-10-CM | POA: Diagnosis not present

## 2012-12-11 DIAGNOSIS — M6281 Muscle weakness (generalized): Secondary | ICD-10-CM | POA: Diagnosis not present

## 2012-12-11 DIAGNOSIS — Z23 Encounter for immunization: Secondary | ICD-10-CM | POA: Diagnosis not present

## 2012-12-11 DIAGNOSIS — R279 Unspecified lack of coordination: Secondary | ICD-10-CM | POA: Diagnosis not present

## 2012-12-15 DIAGNOSIS — M6281 Muscle weakness (generalized): Secondary | ICD-10-CM | POA: Diagnosis not present

## 2012-12-15 DIAGNOSIS — R279 Unspecified lack of coordination: Secondary | ICD-10-CM | POA: Diagnosis not present

## 2012-12-17 DIAGNOSIS — R279 Unspecified lack of coordination: Secondary | ICD-10-CM | POA: Diagnosis not present

## 2012-12-17 DIAGNOSIS — M6281 Muscle weakness (generalized): Secondary | ICD-10-CM | POA: Diagnosis not present

## 2012-12-18 DIAGNOSIS — M6281 Muscle weakness (generalized): Secondary | ICD-10-CM | POA: Diagnosis not present

## 2012-12-18 DIAGNOSIS — R279 Unspecified lack of coordination: Secondary | ICD-10-CM | POA: Diagnosis not present

## 2012-12-30 DIAGNOSIS — M6281 Muscle weakness (generalized): Secondary | ICD-10-CM | POA: Diagnosis not present

## 2012-12-30 DIAGNOSIS — R279 Unspecified lack of coordination: Secondary | ICD-10-CM | POA: Diagnosis not present

## 2013-01-01 DIAGNOSIS — R279 Unspecified lack of coordination: Secondary | ICD-10-CM | POA: Diagnosis not present

## 2013-01-01 DIAGNOSIS — M6281 Muscle weakness (generalized): Secondary | ICD-10-CM | POA: Diagnosis not present

## 2013-01-05 DIAGNOSIS — M6281 Muscle weakness (generalized): Secondary | ICD-10-CM | POA: Diagnosis not present

## 2013-01-05 DIAGNOSIS — R279 Unspecified lack of coordination: Secondary | ICD-10-CM | POA: Diagnosis not present

## 2013-01-07 DIAGNOSIS — R279 Unspecified lack of coordination: Secondary | ICD-10-CM | POA: Diagnosis not present

## 2013-01-07 DIAGNOSIS — M6281 Muscle weakness (generalized): Secondary | ICD-10-CM | POA: Diagnosis not present

## 2013-01-11 DIAGNOSIS — M6281 Muscle weakness (generalized): Secondary | ICD-10-CM | POA: Diagnosis not present

## 2013-01-11 DIAGNOSIS — R279 Unspecified lack of coordination: Secondary | ICD-10-CM | POA: Diagnosis not present

## 2013-01-11 DIAGNOSIS — Z1379 Encounter for other screening for genetic and chromosomal anomalies: Secondary | ICD-10-CM | POA: Diagnosis not present

## 2013-01-18 DIAGNOSIS — H1044 Vernal conjunctivitis: Secondary | ICD-10-CM | POA: Diagnosis not present

## 2013-02-09 DIAGNOSIS — M6281 Muscle weakness (generalized): Secondary | ICD-10-CM | POA: Diagnosis not present

## 2013-02-09 DIAGNOSIS — R279 Unspecified lack of coordination: Secondary | ICD-10-CM | POA: Diagnosis not present

## 2013-02-11 DIAGNOSIS — R279 Unspecified lack of coordination: Secondary | ICD-10-CM | POA: Diagnosis not present

## 2013-02-11 DIAGNOSIS — M6281 Muscle weakness (generalized): Secondary | ICD-10-CM | POA: Diagnosis not present

## 2013-03-17 ENCOUNTER — Encounter: Payer: Self-pay | Admitting: *Deleted

## 2013-03-18 ENCOUNTER — Ambulatory Visit: Payer: Medicare Other | Admitting: Internal Medicine

## 2013-03-29 ENCOUNTER — Other Ambulatory Visit: Payer: Self-pay | Admitting: Internal Medicine

## 2013-03-29 ENCOUNTER — Encounter: Payer: Self-pay | Admitting: Internal Medicine

## 2013-03-29 ENCOUNTER — Ambulatory Visit (INDEPENDENT_AMBULATORY_CARE_PROVIDER_SITE_OTHER): Payer: Medicare Other | Admitting: Internal Medicine

## 2013-03-29 VITALS — BP 140/80 | HR 73 | Temp 97.6°F | Resp 20 | Ht 65.5 in | Wt 171.0 lb

## 2013-03-29 DIAGNOSIS — Z23 Encounter for immunization: Secondary | ICD-10-CM

## 2013-03-29 DIAGNOSIS — R600 Localized edema: Secondary | ICD-10-CM

## 2013-03-29 DIAGNOSIS — R609 Edema, unspecified: Secondary | ICD-10-CM

## 2013-03-29 MED ORDER — TRAZODONE HCL 150 MG PO TABS: 150.0000 mg | ORAL_TABLET | Freq: Every day | ORAL | Status: DC

## 2013-03-29 NOTE — Patient Instructions (Addendum)
Limit your sodium (Salt) intake  Keep your legs elevated as much as possible  Increase furosemide to twice daily for the next 3 days and then resume once daily in the morning2 Gram Low Sodium Diet A 2 gram sodium diet restricts the amount of sodium in the diet to no more than 2 g or 2000 mg daily. Limiting the amount of sodium is often used to help lower blood pressure. It is important if you have heart, liver, or kidney problems. Many foods contain sodium for flavor and sometimes as a preservative. When the amount of sodium in a diet needs to be low, it is important to know what to look for when choosing foods and drinks. The following includes some information and guidelines to help make it easier for you to adapt to a low sodium diet. QUICK TIPS  Do not add salt to food.  Avoid convenience items and fast food.  Choose unsalted snack foods.  Buy lower sodium products, often labeled as "lower sodium" or "no salt added."  Check food labels to learn how much sodium is in 1 serving.  When eating at a restaurant, ask that your food be prepared with less salt or none, if possible. READING FOOD LABELS FOR SODIUM INFORMATION The nutrition facts label is a good place to find how much sodium is in foods. Look for products with no more than 500 to 600 mg of sodium per meal and no more than 150 mg per serving. Remember that 2 g = 2000 mg. The food label may also list foods as:  Sodium-free: Less than 5 mg in a serving.  Very low sodium: 35 mg or less in a serving.  Low-sodium: 140 mg or less in a serving.  Light in sodium: 50% less sodium in a serving. For example, if a food that usually has 300 mg of sodium is changed to become light in sodium, it will have 150 mg of sodium.  Reduced sodium: 25% less sodium in a serving. For example, if a food that usually has 400 mg of sodium is changed to reduced sodium, it will have 300 mg of sodium. CHOOSING FOODS Grains  Avoid: Salted crackers and  snack items. Some cereals, including instant hot cereals. Bread stuffing and biscuit mixes. Seasoned rice or pasta mixes.  Choose: Unsalted snack items. Low-sodium cereals, oats, puffed wheat and rice, shredded wheat. English muffins and bread. Pasta. Meats  Avoid: Salted, canned, smoked, spiced, pickled meats, including fish and poultry. Bacon, ham, sausage, cold cuts, hot dogs, anchovies.  Choose: Low-sodium canned tuna and salmon. Fresh or frozen meat, poultry, and fish. Dairy  Avoid: Processed cheese and spreads. Cottage cheese. Buttermilk and condensed milk. Regular cheese.  Choose: Milk. Low-sodium cottage cheese. Yogurt. Sour cream. Low-sodium cheese. Fruits and Vegetables  Avoid: Regular canned vegetables. Regular canned tomato sauce and paste. Frozen vegetables in sauces. Olives. Tracy Rodriguez. Relishes. Sauerkraut.  Choose: Low-sodium canned vegetables. Low-sodium tomato sauce and paste. Frozen or fresh vegetables. Fresh and frozen fruit. Condiments  Avoid: Canned and packaged gravies. Worcestershire sauce. Tartar sauce. Barbecue sauce. Soy sauce. Steak sauce. Ketchup. Onion, garlic, and table salt. Meat flavorings and tenderizers.  Choose: Fresh and dried herbs and spices. Low-sodium varieties of mustard and ketchup. Lemon juice. Tabasco sauce. Horseradish. SAMPLE 2 GRAM SODIUM MEAL PLAN Breakfast / Sodium (mg)  1 cup low-fat milk / [id number] mg  2 slices whole-wheat toast / 270 mg  1 tbs heart-healthy margarine / 153 mg  1 hard-boiled egg / 139  mg  1 small orange / 0 mg Lunch / Sodium (mg)  1 cup raw carrots / 76 mg   cup hummus / 298 mg  1 cup low-fat milk / 143 mg   cup red grapes / 2 mg  1 whole-wheat pita bread / 356 mg Dinner / Sodium (mg)  1 cup whole-wheat pasta / 2 mg  1 cup low-sodium tomato sauce / 73 mg  3 oz lean ground beef / 57 mg  1 small side salad (1 cup raw spinach leaves,  cup cucumber,  cup yellow bell pepper) with 1 tsp olive oil and 1  tsp red wine vinegar / 25 mg Snack / Sodium (mg)  1 container low-fat vanilla yogurt / 107 mg  3 graham cracker squares / 127 mg Nutrient Analysis  Calories: 2033  Protein: 77 g  Carbohydrate: 282 g  Fat: 72 g  Sodium: 1971 mg Document Released: 02/25/2005 Document Revised: 05/20/2011 Document Reviewed: 05/29/2009 [employer] Patient Information 2014 [place]. Seasoning Without Salt Seasoning without salt decreases the amount of sodium in the diet. This can be done by using less salt or no salt, and eating foods with less sodium. Lowering the amount of sodium is often used to help lower blood pressure, and is important for heart failure, liver and kidney problems. Many foods contain sodium for flavor and sometimes, it is added as a preservative. When the amount of sodium in a diet needs to be low, it is important to know what to look for when choosing foods and drinks.  TIPS TO LOWER THE SODIUM IN YOUR DIET  Do not add salt to food.  Check food labels to learn how much sodium is in one serving. Look for products with no more than 150 mg of sodium in one serving.  Avoid convenience items and fast food that are often high in sodium.  Choose unsalted snack foods.  Buy lower sodium products often labeled as "lower sodium" or "no salt added."  When eating at a restaurant, ask that your food be prepared with less salt, or none if possible.  Check medicines such as antacids for sodium content. Ask your pharmacist if you are not sure.  Use herbs, seasonings without salt, and spices as substitutes for salt in foods. SALT ALTERNATIVES Listed below are herbs, seasonings and spices that can be used as alternatives to salt for giving taste to foods. Next to their names are foods they can be used to flavor.  Herbs  Bay - Soups, meat and vegetable dishes and spaghetti sauce.  Basil - [employer], soups, pasta and fish dishes.  Cilantro - Meat, poultry and vegetable  dishes.  Chili Powder - Marinades and Mexican dishes.  Cumin - Mexican dishes, couscous and meat dishes.  Dill - Fish dishes, sauces, and salads.  Fennel - Meat and vegetable dishes, breads, cookies.  Marjoram - Soups, potato dishes and meat dishes.  Oregano - Pizza and spaghetti sauce.  Parsley - Salads, soups, pasta and meat dishes.  Rosemary - [place] dishes, salad dressings, soups, and red meats.  Saffron - Fish dishes, pasta, some poultry dishes.  Sage - Stuffings and sauces.  Tarragon - Fish and [employer].  Thyme - Stuffing, meat and fish dishes. Seasonings  Chives - Salad dressings and potato dishes.  Garlic (NOT garlic salt) - [place] dishes, meat dishes, salad dressings, and sauces.  Lemon juice - Fish dishes, poultry dishes, and salads.  Vinegar - Salad dressings, vegetables, and fish dishes. Spices  Cinnamon - Used in sweet dishes; cakes, cookies, and puddings.  Cloves - Gingerbread, puddings, and marinades for meats.  Curry - Vegetable dishes, fish and poultry dishes, and stir fry dishes.  Ginger - Vegetables dishes, fish dishes, and stir fry dishes.  Nutmeg - Pasta, vegetables, poultry, fish dishes, and custard. AVOIDING FOODS HIGH IN SODIUM Avoid the following foods and condiments, as they are high in sodium.  Barbecue sauce.  Horseradish.  Soy sauce.  Teriyaki sauce.  Steak sauce.  Chili sauce.  Cocktail sauce.  Tartar sauce.  Salsa.  Packaged gravies.  Tracy Rodriguez.  Olives.  Sauerkraut.  Salted nuts.  Canned soups. Document Released: 02/25/2005 Document Revised: 05/20/2011 Document Reviewed: 07/11/2008 [facility] Patient Information 2014 [place].

## 2013-03-29 NOTE — Progress Notes (Signed)
Subjective:    Patient ID: Tracy Rodriguez, female    DOB: 02-24-1932, 80 y.o.   MRN: [id number]  HPI  80 year old patient who is seen today for her by annual followup. She has a history of chronic low back pain but has no concerns today. She also has a history of intermittent lower extremity edema and has been on chronic furosemide 40 mg every morning. This was her chief complaints 6 months ago. Evaluation at that time revealed no clinical edema and laboratory screen fairly unremarkable. She states that she has had increase in swelling involving the feet and ankles for the past 4 weeks. She also complains of a weight gain since a resident of [place].  History reviewed. No pertinent past medical history.  History   Social History  . Marital Status: Widowed    Spouse Name: N/A    Number of Children: N/A  . Years of Education: N/A   Occupational History  . Not on file.   Social History Main Topics  . Smoking status: Never Smoker   . Smokeless tobacco: Never Used  . Alcohol Use: No  . Drug Use: No  . Sexual Activity: Not on file   Other Topics Concern  . Not on file   Social History Narrative  . No narrative on file    History reviewed. No pertinent past surgical history.  No family history on file.  Allergies  Allergen Reactions  . Codeine Anaphylaxis  . Sulfa Antibiotics Rash    Current Outpatient Prescriptions on File Prior to Visit  Medication Sig Dispense Refill  . escitalopram (LEXAPRO) 10 MG tablet Take 1 tablet (10 mg total) by mouth daily.  90 tablet  1  . furosemide (LASIX) 40 MG tablet Take 1 tablet (40 mg total) by mouth daily.  90 tablet  3  . gabapentin (NEURONTIN) 300 MG capsule Take 1 capsule (300 mg total) by mouth 2 (two) times daily.  180 capsule  3  . nabumetone (RELAFEN) 750 MG tablet Take 1 tablet (750 mg total) by mouth 2 (two) times daily.  180 tablet  3  . pseudoephedrine (SUDAFED) 30 MG tablet Take 30 mg by mouth as needed for congestion.       . QUEtiapine (SEROQUEL) 25 MG tablet Take 1 tablet (25 mg total) by mouth 3 (three) times daily.  270 tablet  3  . traMADol (ULTRAM) 50 MG tablet TAKE 2 TABLETS BY MOUTH EVERY 8 HOURS  180 tablet  2  . diphenoxylate-atropine (LOMOTIL) 2.5-0.025 MG per tablet Take 1 tablet by mouth 4 (four) times daily as needed for diarrhea or loose stools.  30 tablet  0   No current facility-administered medications on file prior to visit.    BP 140/80  Pulse 73  Temp(Src) 97.6 F (36.4 C) (Oral)  Resp 20  Ht 5' 5.5" (1.664 m)  Wt 171 lb (77.565 kg)  BMI 28.01 kg/m2  SpO2 97%  LMP 10/10/1969     Review of Systems  Constitutional: Negative.   HENT: Negative for congestion, dental problem, hearing loss, rhinorrhea, sinus pressure, sore throat and tinnitus.   Eyes: Negative for pain, discharge and visual disturbance.  Respiratory: Negative for cough and shortness of breath.   Cardiovascular: Positive for leg swelling. Negative for chest pain and palpitations.  Gastrointestinal: Negative for nausea, vomiting, abdominal pain, diarrhea, constipation, blood in stool and abdominal distention.  Genitourinary: Negative for dysuria, urgency, frequency, hematuria, flank pain, vaginal bleeding, vaginal discharge, difficulty urinating, vaginal pain and  pelvic pain.  Musculoskeletal: Negative for arthralgias, gait problem and joint swelling.  Skin: Negative for rash.  Neurological: Negative for dizziness, syncope, speech difficulty, weakness, numbness and headaches.  Hematological: Negative for adenopathy.  Psychiatric/Behavioral: Negative for behavioral problems, dysphoric mood and agitation. The patient is not nervous/anxious.        Objective:   Physical Exam  Constitutional: She is oriented to person, place, and time. She appears well-developed and well-nourished.  HENT:  Head: Normocephalic.  Right Ear: External ear normal.  Left Ear: External ear normal.  Mouth/Throat: Oropharynx is clear and  moist.  Eyes: Conjunctivae and EOM are normal. Pupils are equal, round, and reactive to light.  Neck: Normal range of motion. Neck supple. No thyromegaly present.  Cardiovascular: Normal rate, regular rhythm, normal heart sounds and intact distal pulses.   Pulmonary/Chest: Effort normal and breath sounds normal.  Abdominal: Soft. Bowel sounds are normal. She exhibits no mass. There is no tenderness.  Musculoskeletal: Normal range of motion.  Bilateral ankle and pedal edema  Lymphadenopathy:    She has no cervical adenopathy.  Neurological: She is alert and oriented to person, place, and time.  Skin: Skin is warm and dry. No rash noted.  Psychiatric: She has a normal mood and affect. Her behavior is normal.          Assessment & Plan:   Recurrent pedal edema. We'll attempt to tighten up on her salt intake. Information was dispensed. She is a resident at [facility] and probably has early control over her salt intake. We'll increase [place] to 40 mg twice a day for 3 days  CPX 6 months  History chronic low back pain. Asymptomatic today

## 2013-03-29 NOTE — Progress Notes (Signed)
Pre-visit discussion using our clinic review tool. No additional management support is needed unless otherwise documented below in the visit note.  

## 2013-04-02 ENCOUNTER — Other Ambulatory Visit: Payer: Self-pay | Admitting: Internal Medicine

## 2013-05-06 ENCOUNTER — Other Ambulatory Visit: Payer: Self-pay | Admitting: Internal Medicine

## 2013-05-07 ENCOUNTER — Telehealth: Payer: Self-pay | Admitting: Internal Medicine

## 2013-05-07 MED ORDER — TRAMADOL HCL 50 MG PO TABS: ORAL_TABLET | ORAL | Status: DC

## 2013-05-07 NOTE — Telephone Encounter (Signed)
Pt needs re-fill of traMADol (ULTRAM) 50 MG tablet

## 2013-05-07 NOTE — Telephone Encounter (Signed)
Rx called in 

## 2013-07-19 ENCOUNTER — Other Ambulatory Visit: Payer: Self-pay | Admitting: Internal Medicine

## 2013-07-20 ENCOUNTER — Telehealth: Payer: Self-pay | Admitting: Internal Medicine

## 2013-07-20 NOTE — Telephone Encounter (Signed)
Patient Information:  Caller Name: Tracy Rodriguez  Phone: [phone]  Patient: Tracy Rodriguez, Tracy Rodriguez  Gender: Female  DOB: 23-Apr-1931  Age: 80 Years  PCP: Bluford Kaufmann (Family Practice > 45yrs old)  Office Follow Up:  Does the office need to follow up with this patient?: No  Instructions For The Office: N/A  RN Note:  Patient was scheduled for appt tomorrow at 14:00 , 07/21/13.   Encouraged her to take afternoon Lasix dose as advised by physician, mointor salt intake.  Caller given home care advice and call back parameters. Understanding expressed.  Symptoms  Reason For Call & Symptoms: Patient states her legs and feet are swollen , ongoing x2 weeks.  Area from toes to knees but reports due to being overweight it is difficult tell.  Hands are slightly swollen.  She does NOT weigh.  She is currently on Lasix 40mg   for chronic Edema . Her lasix was increased to 40mg  bid  in January 2015 at her annual visit due to Edema. she states she sometimes forgets to take the afternoon dose. Last time she took prescribed dosage was "a few days ago">    The edema does go away at night and returns during the day.  Today, she has walked the hallway , to dining hall and rest of time she is reading with legs elevated on sofa.   She is a resident [facility] History In EMR: Yes  Reviewed Medications In EMR: Yes  Reviewed Allergies In EMR: Yes  Reviewed Surgeries / Procedures: Yes  Date of Onset of Symptoms: 07/06/2013  Guideline(s) Used:  Leg Swelling and Edema  Disposition Per Guideline:   See Today in Office  Reason For Disposition Reached:   Patient wants to be seen  Advice Given:  Varicose Veins - Treatment:  Try to rest and elevate your legs above your heart a couple times each day for 15 minutes.  Walking is good for your blood flow (Reason: helps pump the blood out of the veins).  Avoid prolonged standing in one place.  Support hose may be helpful. Put them on first thing in the  morning when the swelling is least.  Call Back If:  Swelling becomes worse  Swelling becomes red or painful to the touch  Calf pain occurs and becomes constant  You become worse.  Patient Will Follow Care Advice:  YES  Appointment Scheduled:  07/21/2013 14:00:00 Appointment Scheduled Provider:  Other

## 2013-07-21 ENCOUNTER — Ambulatory Visit (INDEPENDENT_AMBULATORY_CARE_PROVIDER_SITE_OTHER): Payer: Medicare Other | Admitting: Internal Medicine

## 2013-07-21 ENCOUNTER — Encounter: Payer: Self-pay | Admitting: Internal Medicine

## 2013-07-21 VITALS — BP 138/80 | HR 81 | Temp 97.9°F | Resp 20 | Ht 65.5 in | Wt 180.0 lb

## 2013-07-21 DIAGNOSIS — R609 Edema, unspecified: Secondary | ICD-10-CM | POA: Diagnosis not present

## 2013-07-21 DIAGNOSIS — R600 Localized edema: Secondary | ICD-10-CM

## 2013-07-21 NOTE — Progress Notes (Signed)
Pre-visit discussion using our clinic review tool. No additional management support is needed unless otherwise documented below in the visit note.  

## 2013-07-21 NOTE — Patient Instructions (Signed)
Limit your sodium (Salt) intake    It is important that you exercise regularly, at least 20 minutes 3 to 4 times per week.  If you develop chest pain or shortness of breath seek  medical attention.  You need to lose weight.  Consider a lower calorie diet and regular exercise.  Return in 6 months for follow-up   

## 2013-07-21 NOTE — Progress Notes (Signed)
Subjective:    Patient ID: Tracy Rodriguez, female    DOB: 1931/08/15, 80 y.o.   MRN: [id number]  HPI  80 year old patient who is seen today with a chief complaint of lower extremity edema of 2 weeks duration.  This has been a chronic intermittent compliance since she established with this office.  She is on daily, furosemide.  Since relocating to [place] she has had a steady, progressive weight gain and now at 180 pounds.  She states when she first arrived to [place].  Her weight was 130 pounds.  She consumes ice cream daily  History reviewed. No pertinent past medical history.  History   Social History  . Marital Status: Widowed    Spouse Name: N/A    Number of Children: N/A  . Years of Education: N/A   Occupational History  . Not on file.   Social History Main Topics  . Smoking status: Never Smoker   . Smokeless tobacco: Never Used  . Alcohol Use: No  . Drug Use: No  . Sexual Activity: Not on file   Other Topics Concern  . Not on file   Social History Narrative  . No narrative on file    History reviewed. No pertinent past surgical history.  No family history on file.  Allergies  Allergen Reactions  . Codeine Anaphylaxis  . Sulfa Antibiotics Rash    Current Outpatient Prescriptions on File Prior to Visit  Medication Sig Dispense Refill  . escitalopram (LEXAPRO) 10 MG tablet TAKE 1 TABLET BY MOUTH ONCE DAILY  90 tablet  0  . furosemide (LASIX) 40 MG tablet Take 1 tablet (40 mg total) by mouth daily.  90 tablet  3  . gabapentin (NEURONTIN) 300 MG capsule Take 1 capsule (300 mg total) by mouth 2 (two) times daily.  180 capsule  3  . nabumetone (RELAFEN) 750 MG tablet Take 1 tablet (750 mg total) by mouth 2 (two) times daily.  180 tablet  3  . pseudoephedrine (SUDAFED) 30 MG tablet Take 30 mg by mouth as needed for congestion.      . QUEtiapine (SEROQUEL) 25 MG tablet Take 1 tablet (25 mg total) by mouth 3 (three) times daily.  270 tablet  3  . traMADol  (ULTRAM) 50 MG tablet TAKE 2 TABLETS BY MOUTH EVERY 8 HOURS  180 tablet  0  . traZODone (DESYREL) 150 MG tablet Take 1 tablet (150 mg total) by mouth at bedtime.  90 tablet  1   No current facility-administered medications on file prior to visit.    BP 138/80  Pulse 81  Temp(Src) 97.9 F (36.6 C) (Oral)  Resp 20  Ht 5' 5.5" (1.664 m)  Wt 180 lb (81.647 kg)  BMI 29.49 kg/m2  SpO2 95%  LMP 10/10/1969     Wt Readings from Last 3 Encounters:  07/21/13 180 lb (81.647 kg)  03/29/13 171 lb (77.565 kg)  11/20/12 164 lb (74.39 kg)    Review of Systems  Constitutional: Negative.   HENT: Negative for congestion, dental problem, hearing loss, rhinorrhea, sinus pressure, sore throat and tinnitus.   Eyes: Negative for pain, discharge and visual disturbance.  Respiratory: Negative for cough and shortness of breath.   Cardiovascular: Positive for leg swelling. Negative for chest pain and palpitations.  Gastrointestinal: Negative for nausea, vomiting, abdominal pain, diarrhea, constipation, blood in stool and abdominal distention.  Genitourinary: Negative for dysuria, urgency, frequency, hematuria, flank pain, vaginal bleeding, vaginal discharge, difficulty urinating, vaginal pain and pelvic pain.  Musculoskeletal: Negative for arthralgias, gait problem and joint swelling.  Skin: Negative for rash.  Neurological: Negative for dizziness, syncope, speech difficulty, weakness, numbness and headaches.  Hematological: Negative for adenopathy.  Psychiatric/Behavioral: Negative for behavioral problems, dysphoric mood and agitation. The patient is not nervous/anxious.        Objective:   Physical Exam  Constitutional: She is oriented to person, place, and time. She appears well-developed and well-nourished.  HENT:  Head: Normocephalic.  Right Ear: External ear normal.  Left Ear: External ear normal.  Mouth/Throat: Oropharynx is clear and moist.  Eyes: Conjunctivae and EOM are normal. Pupils  are equal, round, and reactive to light.  Neck: Normal range of motion. Neck supple. No thyromegaly present.  Cardiovascular: Normal rate, regular rhythm, normal heart sounds and intact distal pulses.   Pulmonary/Chest: Effort normal and breath sounds normal.  Abdominal: Soft. Bowel sounds are normal. She exhibits no mass. There is no tenderness.  Musculoskeletal: Normal range of motion. She exhibits no edema.  No obvious lower extremity edema  Lymphadenopathy:    She has no cervical adenopathy.  Neurological: She is alert and oriented to person, place, and time.  Skin: Skin is warm and dry. No rash noted.  Psychiatric: She has a normal mood and affect. Her behavior is normal.          Assessment & Plan:   Weight gain History of pedal edema  Weight loss with more vigorous activity and walking, as well as calorie restriction discussed and encouraged Low-salt diet recommended

## 2013-08-02 ENCOUNTER — Other Ambulatory Visit: Payer: Self-pay | Admitting: Internal Medicine

## 2013-08-03 ENCOUNTER — Other Ambulatory Visit: Payer: Self-pay

## 2013-08-03 MED ORDER — TRAMADOL HCL 50 MG PO TABS: ORAL_TABLET | ORAL | Status: DC

## 2013-08-04 ENCOUNTER — Telehealth: Payer: Self-pay | Admitting: Internal Medicine

## 2013-08-04 MED ORDER — TRAMADOL HCL 50 MG PO TABS: 50.0000 mg | ORAL_TABLET | Freq: Three times a day (TID) | ORAL | Status: DC | PRN

## 2013-08-04 NOTE — Telephone Encounter (Signed)
Pt req rx on traMADol (ULTRAM) 50 MG tablet

## 2013-08-04 NOTE — Telephone Encounter (Signed)
Spoke to pt told her Rx called into pharmacy. Pt verbalized understanding.

## 2013-09-19 ENCOUNTER — Other Ambulatory Visit: Payer: Self-pay | Admitting: Internal Medicine

## 2013-09-24 ENCOUNTER — Telehealth: Payer: Self-pay | Admitting: Internal Medicine

## 2013-09-24 MED ORDER — TRAMADOL HCL 50 MG PO TABS: 50.0000 mg | ORAL_TABLET | Freq: Three times a day (TID) | ORAL | Status: DC | PRN

## 2013-09-24 NOTE — Telephone Encounter (Signed)
Spoke to pt told her will call Rx into pharmacy. Rx done

## 2013-09-24 NOTE — Telephone Encounter (Signed)
Pt requesting refill of traMADol (ULTRAM) 50 MG tablet # 30.  Pharmacy:  [facility]

## 2013-09-27 ENCOUNTER — Telehealth: Payer: Self-pay | Admitting: Internal Medicine

## 2013-09-27 ENCOUNTER — Encounter: Payer: Self-pay | Admitting: Internal Medicine

## 2013-09-27 ENCOUNTER — Ambulatory Visit (INDEPENDENT_AMBULATORY_CARE_PROVIDER_SITE_OTHER): Payer: Medicare Other | Admitting: Internal Medicine

## 2013-09-27 VITALS — BP 170/90 | HR 89 | Temp 97.4°F | Resp 20 | Ht 65.5 in | Wt 180.0 lb

## 2013-09-27 DIAGNOSIS — E669 Obesity, unspecified: Secondary | ICD-10-CM

## 2013-09-27 MED ORDER — TRAMADOL HCL 50 MG PO TABS: 50.0000 mg | ORAL_TABLET | Freq: Four times a day (QID) | ORAL | Status: DC

## 2013-09-27 NOTE — Telephone Encounter (Signed)
Pt has an appointment today will discuss at appointment.

## 2013-09-27 NOTE — Progress Notes (Signed)
Pre visit review using our clinic review tool, if applicable. No additional management support is needed unless otherwise documented below in the visit note. 

## 2013-09-27 NOTE — Patient Instructions (Signed)
Limit your sodium (Salt) intake    It is important that you exercise regularly, at least 20 minutes 3 to 4 times per week.  If you develop chest pain or shortness of breath seek  medical attention.  You need to lose weight.  Consider a lower calorie diet and regular exercise.  Return in 6 months for follow-up   

## 2013-09-27 NOTE — Progress Notes (Signed)
Subjective:    Patient ID: Tracy Rodriguez, female    DOB: 1931-12-14, 80 y.o.   MRN: [id number]  HPI  80 year old patient who is in today for followup.  She has a history of bipolar disorder, as well as chronic low back pain.  She has been out of tramadol since she has been taking 2 twice a day.  Her prescription has been written for one every 8 hours when necessary. Bipolar depression has been stable. Her weight also has been stable although still at 200 pounds.  She still consumes ice cream frequently, but has cut back a bit;  when she arrived at [facility].  Her weight was 130 pounds  History reviewed. No pertinent past medical history.  History   Social History  . Marital Status: Widowed    Spouse Name: N/A    Number of Children: N/A  . Years of Education: N/A   Occupational History  . Not on file.   Social History Main Topics  . Smoking status: Never Smoker   . Smokeless tobacco: Never Used  . Alcohol Use: No  . Drug Use: No  . Sexual Activity: Not on file   Other Topics Concern  . Not on file   Social History Narrative  . No narrative on file    History reviewed. No pertinent past surgical history.  No family history on file.  Allergies  Allergen Reactions  . Codeine Anaphylaxis  . Sulfa Antibiotics Rash    Current Outpatient Prescriptions on File Prior to Visit  Medication Sig Dispense Refill  . escitalopram (LEXAPRO) 10 MG tablet TAKE 1 TABLET BY MOUTH ONCE DAILY  90 tablet  0  . furosemide (LASIX) 40 MG tablet Take 1 tablet (40 mg total) by mouth daily.  90 tablet  3  . gabapentin (NEURONTIN) 300 MG capsule TAKE ONE CAPSULE BY MOUTH TWICE DAILY  180 capsule  3  . nabumetone (RELAFEN) 750 MG tablet Take 1 tablet (750 mg total) by mouth 2 (two) times daily.  180 tablet  3  . pseudoephedrine (SUDAFED) 30 MG tablet Take 30 mg by mouth as needed for congestion.      . QUEtiapine (SEROQUEL) 25 MG tablet Take 1 tablet (25 mg total) by mouth 3 (three) times  daily.  270 tablet  3  . traMADol (ULTRAM) 50 MG tablet Take 1 tablet (50 mg total) by mouth every 8 (eight) hours as needed for moderate pain.  90 tablet  2  . traZODone (DESYREL) 150 MG tablet Take 1 tablet (150 mg total) by mouth at bedtime.  90 tablet  1   No current facility-administered medications on file prior to visit.    BP 170/90  Pulse 89  Temp(Src) 97.4 F (36.3 C) (Oral)  Resp 20  Ht 5' 5.5" (1.664 m)  Wt 180 lb (81.647 kg)  BMI 29.49 kg/m2  SpO2 98%  LMP 10/10/1969    Review of Systems  Constitutional: Negative.   HENT: Negative for congestion, dental problem, hearing loss, rhinorrhea, sinus pressure, sore throat and tinnitus.   Eyes: Negative for pain, discharge and visual disturbance.  Respiratory: Negative for cough and shortness of breath.   Cardiovascular: Negative for chest pain, palpitations and leg swelling.  Gastrointestinal: Negative for nausea, vomiting, abdominal pain, diarrhea, constipation, blood in stool and abdominal distention.  Genitourinary: Negative for dysuria, urgency, frequency, hematuria, flank pain, vaginal bleeding, vaginal discharge, difficulty urinating, vaginal pain and pelvic pain.  Musculoskeletal: Positive for back pain. Negative for arthralgias, gait  problem and joint swelling.  Skin: Negative for rash.  Neurological: Negative for dizziness, syncope, speech difficulty, weakness, numbness and headaches.  Hematological: Negative for adenopathy.  Psychiatric/Behavioral: Negative for behavioral problems, dysphoric mood and agitation. The patient is not nervous/anxious.        Objective:   Physical Exam  Constitutional: She is oriented to person, place, and time. She appears well-developed and well-nourished.  Repeat blood pressure 150/84  HENT:  Head: Normocephalic.  Right Ear: External ear normal.  Left Ear: External ear normal.  Mouth/Throat: Oropharynx is clear and moist.  Eyes: Conjunctivae and EOM are normal. Pupils are  equal, round, and reactive to light.  Neck: Normal range of motion. Neck supple. No thyromegaly present.  Cardiovascular: Normal rate, regular rhythm, normal heart sounds and intact distal pulses.   Pulmonary/Chest: Effort normal and breath sounds normal.  Abdominal: Soft. Bowel sounds are normal. She exhibits no mass. There is no tenderness.  Musculoskeletal: Normal range of motion.  Lymphadenopathy:    She has no cervical adenopathy.  Neurological: She is alert and oriented to person, place, and time.  Skin: Skin is warm and dry. No rash noted.  Psychiatric: She has a normal mood and affect. Her behavior is normal.          Assessment & Plan:   Chronic low back pain.  Tramadol refilled Bipolar depression Obesity.  Weight loss encouraged  Recheck 6 months

## 2013-09-27 NOTE — Telephone Encounter (Signed)
Pt daughter called and said her Mom takes 4 traMADol (ULTRAM) 50 MG tablet every 24 hours instead of  3 and she need to make sure that this is how the rx is wrritten  . She has been doing this for years according to her daughter. Daughter sad pharmacy [facility] will need a written explanation about why the dosage is changing. Daughter said she is going to need to pick up this medicine once she leaves this office visit.

## 2013-10-09 ENCOUNTER — Other Ambulatory Visit: Payer: Self-pay | Admitting: Internal Medicine

## 2013-10-21 ENCOUNTER — Other Ambulatory Visit: Payer: Self-pay | Admitting: Internal Medicine

## 2013-10-22 ENCOUNTER — Other Ambulatory Visit: Payer: Self-pay | Admitting: Internal Medicine

## 2013-11-01 ENCOUNTER — Encounter: Payer: Self-pay | Admitting: Internal Medicine

## 2013-11-01 ENCOUNTER — Ambulatory Visit (INDEPENDENT_AMBULATORY_CARE_PROVIDER_SITE_OTHER): Payer: Medicare Other | Admitting: Internal Medicine

## 2013-11-01 VITALS — BP 142/90 | HR 91 | Temp 97.9°F | Resp 20 | Ht 65.5 in | Wt 184.0 lb

## 2013-11-01 DIAGNOSIS — B9789 Other viral agents as the cause of diseases classified elsewhere: Principal | ICD-10-CM

## 2013-11-01 DIAGNOSIS — J069 Acute upper respiratory infection, unspecified: Secondary | ICD-10-CM | POA: Diagnosis not present

## 2013-11-01 MED ORDER — BENZONATATE 100 MG PO CAPS: 100.0000 mg | ORAL_CAPSULE | Freq: Two times a day (BID) | ORAL | Status: DC | PRN

## 2013-11-01 NOTE — Progress Notes (Signed)
Subjective:    Patient ID: Tracy Rodriguez, female    DOB: [birth year], 80 y.o.   MRN: [id number]  HPI   80 year old patient who presents with a one-week history of cough, congestion, malaise.  No fever.  Cough has been minimally productive of yellow sputum.  No chest pain, wheezing, or shortness of breath.   History reviewed. No pertinent past medical history.  History   Social History  . Marital Status: Widowed    Spouse Name: N/A    Number of Children: N/A  . Years of Education: N/A   Occupational History  . Not on file.   Social History Main Topics  . Smoking status: Never Smoker   . Smokeless tobacco: Never Used  . Alcohol Use: No  . Drug Use: No  . Sexual Activity: Not on file   Other Topics Concern  . Not on file   Social History Narrative  . No narrative on file    History reviewed. No pertinent past surgical history.  No family history on file.  Allergies  Allergen Reactions  . Codeine Anaphylaxis  . Sulfa Antibiotics Rash    Current Outpatient Prescriptions on File Prior to Visit  Medication Sig Dispense Refill  . escitalopram (LEXAPRO) 10 MG tablet TAKE 1 TABLET BY MOUTH ONCE A DAY  90 tablet  0  . furosemide (LASIX) 40 MG tablet Take 1 tablet (40 mg total) by mouth daily.  90 tablet  3  . gabapentin (NEURONTIN) 300 MG capsule TAKE ONE CAPSULE BY MOUTH TWICE DAILY  180 capsule  3  . nabumetone (RELAFEN) 750 MG tablet Take 1 tablet (750 mg total) by mouth 2 (two) times daily.  180 tablet  3  . pseudoephedrine (SUDAFED) 30 MG tablet Take 30 mg by mouth as needed for congestion.      . QUEtiapine (SEROQUEL) 25 MG tablet TAKE 1 TABLET BY MOUTH THREE TIMES DAILY  270 tablet  0  . traMADol (ULTRAM) 50 MG tablet Take 1 tablet (50 mg total) by mouth 4 (four) times daily.  120 tablet  0  . traZODone (DESYREL) 150 MG tablet Take 1 tablet (150 mg total) by mouth at bedtime.  90 tablet  1   No current facility-administered medications on file prior to visit.     BP 142/90  Pulse 91  Temp(Src) 97.9 F (36.6 C) (Oral)  Resp 20  Ht 5' 5.5" (1.664 m)  Wt 184 lb (83.462 kg)  BMI 30.14 kg/m2  SpO2 95%  LMP 10/10/1969    Review of Systems  Constitutional: Positive for activity change, appetite change and fatigue.  HENT: Positive for congestion, postnasal drip, rhinorrhea and sinus pressure. Negative for dental problem, hearing loss, sore throat and tinnitus.   Eyes: Negative for pain, discharge and visual disturbance.  Respiratory: Positive for cough. Negative for shortness of breath.   Cardiovascular: Negative for chest pain, palpitations and leg swelling.  Gastrointestinal: Negative for nausea, vomiting, abdominal pain, diarrhea, constipation, blood in stool and abdominal distention.  Genitourinary: Negative for dysuria, urgency, frequency, hematuria, flank pain, vaginal bleeding, vaginal discharge, difficulty urinating, vaginal pain and pelvic pain.  Musculoskeletal: Negative for arthralgias, gait problem and joint swelling.  Skin: Negative for rash.  Neurological: Negative for dizziness, syncope, speech difficulty, weakness, numbness and headaches.  Hematological: Negative for adenopathy.  Psychiatric/Behavioral: Negative for behavioral problems, dysphoric mood and agitation. The patient is not nervous/anxious.        Objective:   Physical Exam  Constitutional: She is oriented  to person, place, and time. She appears well-developed and well-nourished.  HENT:  Head: Normocephalic.  Right Ear: External ear normal.  Left Ear: External ear normal.  Mouth/Throat: Oropharynx is clear and moist.  Eyes: Conjunctivae and EOM are normal. Pupils are equal, round, and reactive to light.  Neck: Normal range of motion. Neck supple. No thyromegaly present.  Cardiovascular: Normal rate, regular rhythm, normal heart sounds and intact distal pulses.   Pulmonary/Chest: Effort normal and breath sounds normal. No respiratory distress. She has no  wheezes. She has no rales.  Abdominal: Soft. Bowel sounds are normal. She exhibits no mass. There is no tenderness.  Musculoskeletal: Normal range of motion.  Lymphadenopathy:    She has no cervical adenopathy.  Neurological: She is alert and oriented to person, place, and time.  Skin: Skin is warm and dry. No rash noted.  Psychiatric: She has a normal mood and affect. Her behavior is normal.          Assessment & Plan:   Viral URI with cough.  Will treat symptomatically Chronic back pain stable

## 2013-11-01 NOTE — Patient Instructions (Addendum)
Acute bronchitis symptoms for less than 10 days are generally not helped by antibiotics.  Take over-the-counter expectorants and cough medications such as  Mucinex DM.  Call if there is no improvement in 5 to 7 days or if  you develop worsening cough, fever, or new symptoms, such as shortness of breath or chest pain.  Return in 6 months for follow-up

## 2013-11-01 NOTE — Progress Notes (Signed)
Pre visit review using our clinic review tool, if applicable. No additional management support is needed unless otherwise documented below in the visit note. 

## 2013-11-18 ENCOUNTER — Telehealth: Payer: Self-pay | Admitting: Internal Medicine

## 2013-11-18 MED ORDER — TRAMADOL HCL 50 MG PO TABS: 50.0000 mg | ORAL_TABLET | Freq: Four times a day (QID) | ORAL | Status: DC

## 2013-11-18 NOTE — Telephone Encounter (Signed)
Pt requesting refill of traMADol (ULTRAM) 50 MG tablet. States she is out of her medication, pt informed pcp is out of the office for the remainder of the week.

## 2013-11-18 NOTE — Telephone Encounter (Signed)
Pt notified Rx called into pharmacy 

## 2013-11-19 NOTE — Telephone Encounter (Signed)
Called pharmacy, they found Rx will fill right away. Called pt back told her pharmacy has the Rx and will have ready in 15-20 minutes for her. Pt verbalized understanding.

## 2013-11-19 NOTE — Telephone Encounter (Signed)
Pt states [facility] does not have the rx. Could you resend?

## 2013-11-24 ENCOUNTER — Other Ambulatory Visit: Payer: Self-pay | Admitting: Internal Medicine

## 2013-12-06 ENCOUNTER — Other Ambulatory Visit: Payer: Self-pay | Admitting: Internal Medicine

## 2013-12-14 DIAGNOSIS — Z23 Encounter for immunization: Secondary | ICD-10-CM | POA: Diagnosis not present

## 2013-12-18 ENCOUNTER — Other Ambulatory Visit: Payer: Self-pay | Admitting: Internal Medicine

## 2014-01-10 ENCOUNTER — Telehealth: Payer: Self-pay | Admitting: Internal Medicine

## 2014-01-11 ENCOUNTER — Other Ambulatory Visit: Payer: Self-pay | Admitting: *Deleted

## 2014-01-11 MED ORDER — NABUMETONE 750 MG PO TABS: ORAL_TABLET | ORAL | Status: DC

## 2014-01-11 NOTE — Telephone Encounter (Signed)
Please add nabumetone (RELAFEN) 750 MG tablet to the below re-fill request

## 2014-01-11 NOTE — Telephone Encounter (Signed)
Rx sent to pharmacy   

## 2014-01-20 ENCOUNTER — Other Ambulatory Visit: Payer: Self-pay | Admitting: *Deleted

## 2014-01-20 ENCOUNTER — Encounter: Payer: Self-pay | Admitting: Internal Medicine

## 2014-01-20 ENCOUNTER — Ambulatory Visit (INDEPENDENT_AMBULATORY_CARE_PROVIDER_SITE_OTHER): Payer: Medicare Other | Admitting: Internal Medicine

## 2014-01-20 ENCOUNTER — Encounter: Payer: Self-pay | Admitting: *Deleted

## 2014-01-20 VITALS — BP 140/76 | HR 72 | Temp 98.0°F | Resp 20 | Ht 65.5 in | Wt 186.0 lb

## 2014-01-20 DIAGNOSIS — Z0000 Encounter for general adult medical examination without abnormal findings: Secondary | ICD-10-CM

## 2014-01-20 DIAGNOSIS — F3110 Bipolar disorder, current episode manic without psychotic features, unspecified: Secondary | ICD-10-CM

## 2014-01-20 DIAGNOSIS — E669 Obesity, unspecified: Secondary | ICD-10-CM | POA: Diagnosis not present

## 2014-01-20 DIAGNOSIS — R635 Abnormal weight gain: Secondary | ICD-10-CM | POA: Diagnosis not present

## 2014-01-20 DIAGNOSIS — G8929 Other chronic pain: Secondary | ICD-10-CM

## 2014-01-20 DIAGNOSIS — E785 Hyperlipidemia, unspecified: Secondary | ICD-10-CM | POA: Diagnosis not present

## 2014-01-20 DIAGNOSIS — M549 Dorsalgia, unspecified: Secondary | ICD-10-CM

## 2014-01-20 LAB — CBC WITH DIFFERENTIAL/PLATELET
Basophils Absolute: 0 10*3/uL (ref 0.0–0.1)
Basophils Relative: 0.5 % (ref 0.0–3.0)
Eosinophils Absolute: 0.2 10*3/uL (ref 0.0–0.7)
Eosinophils Relative: 2.8 % (ref 0.0–5.0)
HCT: 39.4 % (ref 36.0–46.0)
Hemoglobin: 12.7 g/dL (ref 12.0–15.0)
Lymphocytes Relative: 21.8 % (ref 12.0–46.0)
Lymphs Abs: 1.2 10*3/uL (ref 0.7–4.0)
MCHC: 32.2 g/dL (ref 30.0–36.0)
MCV: 87.9 fl (ref 78.0–100.0)
Monocytes Absolute: 0.5 10*3/uL (ref 0.1–1.0)
Monocytes Relative: 9.3 % (ref 3.0–12.0)
Neutro Abs: 3.6 10*3/uL (ref 1.4–7.7)
Neutrophils Relative %: 65.6 % (ref 43.0–77.0)
Platelets: 216 10*3/uL (ref 150.0–400.0)
RBC: 4.49 Mil/uL (ref 3.87–5.11)
RDW: 15 % (ref 11.5–15.5)
WBC: 5.6 10*3/uL (ref 4.0–10.5)

## 2014-01-20 LAB — COMPREHENSIVE METABOLIC PANEL
ALT: 13 U/L (ref 0–35)
AST: 24 U/L (ref 0–37)
Albumin: 3.1 g/dL — ABNORMAL LOW (ref 3.5–5.2)
Alkaline Phosphatase: 90 U/L (ref 39–117)
BUN: 23 mg/dL (ref 6–23)
CO2: 28 mEq/L (ref 19–32)
Calcium: 9.3 mg/dL (ref 8.4–10.5)
Chloride: 104 mEq/L (ref 96–112)
Creatinine, Ser: 1.4 mg/dL — ABNORMAL HIGH (ref 0.4–1.2)
GFR: 38.07 mL/min — ABNORMAL LOW (ref >60.00)
Glucose, Bld: 86 mg/dL (ref 70–99)
Potassium: 3.2 mEq/L — ABNORMAL LOW (ref 3.5–5.1)
Sodium: 140 mEq/L (ref 135–145)
Total Bilirubin: 0.5 mg/dL (ref 0.2–1.2)
Total Protein: 7.3 g/dL (ref 6.0–8.3)

## 2014-01-20 LAB — LIPID PANEL
Cholesterol: 299 mg/dL — ABNORMAL HIGH (ref 0–200)
HDL: 51.7 mg/dL (ref >39.00)
LDL Cholesterol: 229 mg/dL — ABNORMAL HIGH (ref 0–99)
NonHDL: 247.3
Total CHOL/HDL Ratio: 6
Triglycerides: 92 mg/dL (ref 0.0–149.0)
VLDL: 18.4 mg/dL (ref 0.0–40.0)

## 2014-01-20 LAB — TSH: TSH: 2.92 u[IU]/mL (ref 0.35–4.50)

## 2014-01-20 MED ORDER — ATORVASTATIN CALCIUM 20 MG PO TABS: 20.0000 mg | ORAL_TABLET | Freq: Every day | ORAL | Status: DC

## 2014-01-20 MED ORDER — TRAMADOL HCL 50 MG PO TABS: 50.0000 mg | ORAL_TABLET | Freq: Four times a day (QID) | ORAL | Status: DC

## 2014-01-20 MED ORDER — ESCITALOPRAM OXALATE 10 MG PO TABS: ORAL_TABLET | ORAL | Status: DC

## 2014-01-20 MED ORDER — TRAZODONE HCL 150 MG PO TABS: ORAL_TABLET | ORAL | Status: DC

## 2014-01-20 NOTE — Patient Instructions (Signed)
Limit your sodium (Salt) intake  You need to lose weight.  Consider a lower calorie diet and regular exercise.    It is important that you exercise regularly, at least 20 minutes 3 to 4 times per week.  If you develop chest pain or shortness of breath seek  medical attention.  Health Maintenance Adopting a healthy lifestyle and getting preventive care can go a long way to promote health and wellness. Talk with your health care provider about what schedule of regular examinations is right for you. This is a good chance for you to check in with your provider about disease prevention and staying healthy. In between checkups, there are plenty of things you can do on your own. Experts have done a lot of research about which lifestyle changes and preventive measures are most likely to keep you healthy. Ask your health care provider for more information. WEIGHT AND DIET  Eat a healthy diet  Be sure to include plenty of vegetables, fruits, low-fat dairy products, and lean protein.  Do not eat a lot of foods high in solid fats, added sugars, or salt.  Get regular exercise. This is one of the most important things you can do for your health.  Most adults should exercise for at least 150 minutes each week. The exercise should increase your heart rate and make you sweat (moderate-intensity exercise).  Most adults should also do strengthening exercises at least twice a week. This is in addition to the moderate-intensity exercise.  Maintain a healthy weight  Body mass index (BMI) is a measurement that can be used to identify possible weight problems. It estimates body fat based on height and weight. Your health care provider can help determine your BMI and help you achieve or maintain a healthy weight.  For females 11 years of age and older:   A BMI below 18.5 is considered underweight.  A BMI of 18.5 to 24.9 is normal.  A BMI of 25 to 29.9 is considered overweight.  A BMI of 30 and above is  considered obese.  Watch levels of cholesterol and blood lipids  You should start having your blood tested for lipids and cholesterol at 78 years of age, then have this test every 5 years.  You may need to have your cholesterol levels checked more often if:  Your lipid or cholesterol levels are high.  You are older than 78 years of age.  You are at high risk for heart disease.  CANCER SCREENING   Lung Cancer  Lung cancer screening is recommended for adults 79-51 years old who are at high risk for lung cancer because of a history of smoking.  A yearly low-dose CT scan of the lungs is recommended for people who:  Currently smoke.  Have quit within the past 15 years.  Have at least a 30-pack-year history of smoking. A pack year is smoking an average of one pack of cigarettes a day for 1 year.  Yearly screening should continue until it has been 15 years since you quit.  Yearly screening should stop if you develop a health problem that would prevent you from having lung cancer treatment.  Breast Cancer  Practice breast self-awareness. This means understanding how your breasts normally appear and feel.  It also means doing regular breast self-exams. Let your health care provider know about any changes, no matter how small.  If you are in your 20s or 30s, you should have a clinical breast exam (CBE) by a health care  provider every 1-3 years as part of a regular health exam.  If you are 57 or older, have a CBE every year. Also consider having a breast X-ray (mammogram) every year.  If you have a family history of breast cancer, talk to your health care provider about genetic screening.  If you are at high risk for breast cancer, talk to your health care provider about having an MRI and a mammogram every year.  Breast cancer gene (BRCA) assessment is recommended for women who have family members with BRCA-related cancers. BRCA-related cancers  include:  Breast.  Ovarian.  Tubal.  Peritoneal cancers.  Results of the assessment will determine the need for genetic counseling and BRCA1 and BRCA2 testing. Cervical Cancer Routine pelvic examinations to screen for cervical cancer are no longer recommended for nonpregnant women who are considered low risk for cancer of the pelvic organs (ovaries, uterus, and vagina) and who do not have symptoms. A pelvic examination may be necessary if you have symptoms including those associated with pelvic infections. Ask your health care provider if a screening pelvic exam is right for you.   The Pap test is the screening test for cervical cancer for women who are considered at risk.  If you had a hysterectomy for a problem that was not cancer or a condition that could lead to cancer, then you no longer need Pap tests.  If you are older than 65 years, and you have had normal Pap tests for the past 10 years, you no longer need to have Pap tests.  If you have had past treatment for cervical cancer or a condition that could lead to cancer, you need Pap tests and screening for cancer for at least 20 years after your treatment.  If you no longer get a Pap test, assess your risk factors if they change (such as having a new sexual partner). This can affect whether you should start being screened again.  Some women have medical problems that increase their chance of getting cervical cancer. If this is the case for you, your health care provider may recommend more frequent screening and Pap tests.  The human papillomavirus (HPV) test is another test that may be used for cervical cancer screening. The HPV test looks for the virus that can cause cell changes in the cervix. The cells collected during the Pap test can be tested for HPV.  The HPV test can be used to screen women 24 years of age and older. Getting tested for HPV can extend the interval between normal Pap tests from three to five years.  An HPV  test also should be used to screen women of any age who have unclear Pap test results.  After 78 years of age, women should have HPV testing as often as Pap tests.  Colorectal Cancer  This type of cancer can be detected and often prevented.  Routine colorectal cancer screening usually begins at 78 years of age and continues through 78 years of age.  Your health care provider may recommend screening at an earlier age if you have risk factors for colon cancer.  Your health care provider may also recommend using home test kits to check for hidden blood in the stool.  A small camera at the end of a tube can be used to examine your colon directly (sigmoidoscopy or colonoscopy). This is done to check for the earliest forms of colorectal cancer.  Routine screening usually begins at age 22.  Direct examination of the colon  should be repeated every 5-10 years through 78 years of age. However, you may need to be screened more often if early forms of precancerous polyps or small growths are found. Skin Cancer  Check your skin from head to toe regularly.  Tell your health care provider about any new moles or changes in moles, especially if there is a change in a mole's shape or color.  Also tell your health care provider if you have a mole that is larger than the size of a pencil eraser.  Always use sunscreen. Apply sunscreen liberally and repeatedly throughout the day.  Protect yourself by wearing long sleeves, pants, a wide-brimmed hat, and sunglasses whenever you are outside. HEART DISEASE, DIABETES, AND HIGH BLOOD PRESSURE   Have your blood pressure checked at least every 1-2 years. High blood pressure causes heart disease and increases the risk of stroke.  If you are between 55 years and 79 years old, ask your health care provider if you should take aspirin to prevent strokes.  Have regular diabetes screenings. This involves taking a blood sample to check your fasting blood sugar  level.  If you are at a normal weight and have a low risk for diabetes, have this test once every three years after 78 years of age.  If you are overweight and have a high risk for diabetes, consider being tested at a younger age or more often. PREVENTING INFECTION  Hepatitis B  If you have a higher risk for hepatitis B, you should be screened for this virus. You are considered at high risk for hepatitis B if:  You were born in a country where hepatitis B is common. Ask your health care provider which countries are considered high risk.  Your parents were born in a high-risk country, and you have not been immunized against hepatitis B (hepatitis B vaccine).  You have HIV or AIDS.  You use needles to inject street drugs.  You live with someone who has hepatitis B.  You have had sex with someone who has hepatitis B.  You get hemodialysis treatment.  You take certain medicines for conditions, including cancer, organ transplantation, and autoimmune conditions. Hepatitis C  Blood testing is recommended for:  Everyone born from 1945 through 1965.  Anyone with known risk factors for hepatitis C. Sexually transmitted infections (STIs)  You should be screened for sexually transmitted infections (STIs) including gonorrhea and chlamydia if:  You are sexually active and are younger than 78 years of age.  You are older than 78 years of age and your health care provider tells you that you are at risk for this type of infection.  Your sexual activity has changed since you were last screened and you are at an increased risk for chlamydia or gonorrhea. Ask your health care provider if you are at risk.  If you do not have HIV, but are at risk, it may be recommended that you take a prescription medicine daily to prevent HIV infection. This is called pre-exposure prophylaxis (PrEP). You are considered at risk if:  You are sexually active and do not regularly use condoms or know the HIV status  of your partner(s).  You take drugs by injection.  You are sexually active with a partner who has HIV. Talk with your health care provider about whether you are at high risk of being infected with HIV. If you choose to begin PrEP, you should first be tested for HIV. You should then be tested every 3 months for   as long as you are taking PrEP.  PREGNANCY   If you are premenopausal and you may become pregnant, ask your health care provider about preconception counseling.  If you may become pregnant, take 400 to 800 micrograms (mcg) of folic acid every day.  If you want to prevent pregnancy, talk to your health care provider about birth control (contraception). OSTEOPOROSIS AND MENOPAUSE   Osteoporosis is a disease in which the bones lose minerals and strength with aging. This can result in serious bone fractures. Your risk for osteoporosis can be identified using a bone density scan.  If you are 65 years of age or older, or if you are at risk for osteoporosis and fractures, ask your health care provider if you should be screened.  Ask your health care provider whether you should take a calcium or vitamin D supplement to lower your risk for osteoporosis.  Menopause may have certain physical symptoms and risks.  Hormone replacement therapy may reduce some of these symptoms and risks. Talk to your health care provider about whether hormone replacement therapy is right for you.  HOME CARE INSTRUCTIONS   Schedule regular health, dental, and eye exams.  Stay current with your immunizations.   Do not use any tobacco products including cigarettes, chewing tobacco, or electronic cigarettes.  If you are pregnant, do not drink alcohol.  If you are breastfeeding, limit how much and how often you drink alcohol.  Limit alcohol intake to no more than 1 drink per day for nonpregnant women. One drink equals 12 ounces of beer, 5 ounces of wine, or 1 ounces of hard liquor.  Do not use street  drugs.  Do not share needles.  Ask your health care provider for help if you need support or information about quitting drugs.  Tell your health care provider if you often feel depressed.  Tell your health care provider if you have ever been abused or do not feel safe at home. Document Released: 09/10/2010 Document Revised: 07/12/2013 Document Reviewed: 01/27/2013 Exit[employer] Patient Information 2015 [employer]. This information is not intended to replace advice given to you by your health care provider. Make sure you discuss any questions you have with your health care provider.  

## 2014-01-20 NOTE — Progress Notes (Signed)
   Subjective:    Patient ID: Tracy Rodriguez, female    DOB: [birth year], 80 y.o.   MRN: [id number]  HPI    Review of Systems     Objective:   Physical Exam        Assessment & Plan:

## 2014-01-20 NOTE — Progress Notes (Signed)
Pre visit review using our clinic review tool, if applicable. No additional management support is needed unless otherwise documented below in the visit note. 

## 2014-01-20 NOTE — Progress Notes (Signed)
Subjective:    Patient ID: Tracy Rodriguez, female    DOB: 08/16/1931, 80 y.o.   MRN: [id number]  HPI 80 year-old patient who is seen today for an annual health assessment.  She is recently widowed about 24 months ago and has relocated to [place]  from [facility]. Her only complaint is weight gain  Wt Readings from Last 3 Encounters:  01/20/14 186 lb (84.369 kg)  11/01/13 184 lb (83.462 kg)  09/27/13 180 lb (81.647 kg)   Past medical history  Remote breast cancer status post partial mastectomy 1981 Hysterectomy 1971 Cataract extraction surgery Chronic low back pain status post decompression 1972 1973, 1985  allergic rhinitis Bipolar disorder History of plantar fasciitis  Allergies sulfa and codeine  Social history widowed retired worked with the [employer] 2 daughters one grandson Relocated from [place] Nonsmoker  Family history father died at 62 MI Mother died age 30 status post CVA history of breast cancer at 70 One brother died at 58 of leukemia One sister is well  1. Risk factors, based on past  M,S,F history- no significant cardiovascular risk factors except age   2.  Physical activities: Chronic low back pain but no activity restrictions. Walks unassisted  3.  Depression/mood: History bipolar depression  4.  Hearing: No deficits  5.  ADL's: Requires assistance in aspects of daily living. Presently resides at a rest home  6.  Fall risk: Low  7.  Home safety: No problems identified  8.  Height weight, and visual acuity; height and weight stable no change in visual acuity has had bilateral cataract extraction surgery  9.  Counseling: Heart healthy diet rectus eyes encouraged  10. Lab orders based on risk factors: Will review of prior medical records  11. Referral : Not appropriate at this time  12. Care plan: Continue present regimen reviewed the past medical records  13. Cognitive assessment: Alert in order with normal  affect. No cognitive dysfunction  14.  Preventive services will include an annual examination.  Annual mammograms.  Also encouraged.  Laboratory studies will also be reviewed annually. Patient was provided with a written and personalized care plan  15.   Provider list includes primary care ophthalmology and radiology.     Review of Systems  Constitutional: Negative for fever, appetite change, fatigue and unexpected weight change.  HENT: Negative for congestion, dental problem, ear pain, hearing loss, mouth sores, nosebleeds, sinus pressure, sore throat, tinnitus, trouble swallowing and voice change.   Eyes: Negative for photophobia, pain, redness and visual disturbance.  Respiratory: Negative for cough, chest tightness and shortness of breath.   Cardiovascular: Negative for chest pain, palpitations and leg swelling.  Gastrointestinal: Negative for nausea, vomiting, abdominal pain, diarrhea, constipation, blood in stool, abdominal distention and rectal pain.  Genitourinary: Negative for dysuria, urgency, frequency, hematuria, flank pain, vaginal bleeding, vaginal discharge, difficulty urinating, genital sores, vaginal pain, menstrual problem and pelvic pain.  Musculoskeletal: Positive for back pain, arthralgias and gait problem. Negative for neck stiffness.       Right plantar fasciitis  Skin: Negative for rash.  Neurological: Negative for dizziness, syncope, speech difficulty, weakness, light-headedness, numbness and headaches.  Hematological: Negative for adenopathy. Does not bruise/bleed easily.  Psychiatric/Behavioral: Negative for suicidal ideas, behavioral problems, self-injury, dysphoric mood and agitation. The patient is not nervous/anxious.        Objective:   Physical Exam  Constitutional: She is oriented to person, place, and time. She appears well-developed and well-nourished.  HENT:  Head: Normocephalic and atraumatic.  Right Ear: External ear normal.  Left Ear: External  ear normal.  Mouth/Throat: Oropharynx is clear and moist.  Low hanging soft palate  Eyes: Conjunctivae and EOM are normal.  Neck: Normal range of motion. Neck supple. No JVD present. No thyromegaly present.  Cardiovascular: Normal rate, regular rhythm, normal heart sounds and intact distal pulses.   No murmur heard. Pedal pulses faint  Pulmonary/Chest: Effort normal and breath sounds normal. She has no wheezes. She has no rales.  Bilateral mastectomies with implants  Abdominal: Soft. Bowel sounds are normal. She exhibits no distension and no mass. There is no tenderness. There is no rebound and no guarding.  Musculoskeletal: Normal range of motion. She exhibits no edema or tenderness.  Mild left ankle edema  Neurological: She is alert and oriented to person, place, and time. She has normal reflexes. No cranial nerve deficit. She exhibits normal muscle tone. Coordination normal.  Skin: Skin is warm and dry. No rash noted.  Psychiatric: She has a normal mood and affect. Her behavior is normal.          Assessment & Plan:  Preventive health exam Bipolar disorder History menopausal syndrome. We'll attempt to taper and discontinue hormone replacement therapy Chronic low back pain. Medications refilled Exogenous obesity.  Weight loss encouraged  Recheck 6 months

## 2014-01-21 ENCOUNTER — Encounter: Payer: Medicare Other | Admitting: Internal Medicine

## 2014-01-31 ENCOUNTER — Telehealth: Payer: Self-pay | Admitting: Internal Medicine

## 2014-01-31 NOTE — Telephone Encounter (Signed)
Patient Information:  Caller Name: Nayah  Phone: [phone]  Patient: Tracy Rodriguez, Tracy Rodriguez  Gender: Female  DOB: 27-Sep-1931  Age: 80 Years  PCP: Bluford Kaufmann (Family Practice > 7yrs old)  Office Follow Up:  Does the office need to follow up with this patient?: No  Instructions For The Office: N/A  RN Note:  Also triaged per Falls protocol with Disposition of See Within 4 hours. Advised proceed now to UC or ED for evaluation within 4 hours. Patient agreeable.  Symptoms  Reason For Call & Symptoms: Fall on 11/21 which patient describes as "tripping" on carpeting and falling face-forward though catching herself with her right knee and right elbow. Patient reports paramedics came and evaluated at that time and she refused transport to the hospital. Patient now reports knee is painful, swollen, and red. Patient can walk though it is painful.  Reviewed Health History In EMR: Yes  Reviewed Medications In EMR: Yes  Reviewed Allergies In EMR: Yes  Reviewed Surgeries / Procedures: Yes  Date of Onset of Symptoms: 01/29/2014  Guideline(s) Used:  Knee Injury  Disposition Per Guideline:   See Today in Office  Reason For Disposition Reached:   Severe pain (e.g., excruciating)  Advice Given:  N/A  Patient Will Follow Care Advice:  YES

## 2014-01-31 NOTE — Telephone Encounter (Signed)
Noted  

## 2014-02-07 ENCOUNTER — Telehealth: Payer: Self-pay | Admitting: Internal Medicine

## 2014-02-07 NOTE — Telephone Encounter (Signed)
Patient Information:  Caller Name: Lisa-Marie  Phone: [phone]  Patient: Tracy Rodriguez, Tracy Rodriguez  Gender: Female  DOB: 11-29-1931  Age: 80 Years  PCP: Bluford Kaufmann (Family Practice > 60yrs old)  Office Follow Up:  Does the office need to follow up with this patient?: No  Instructions For The Office: N/A  RN Note:  Pt. has to arrange transportation and declined an appt. on morning of 02/08/14. Scheduled for evaluation on 02/09/14 with Dr. Sherren Mocha.  Symptoms  Reason For Call & Symptoms: Pt. is having a lot of trouble walking due to Rt, foot pain. Pt has been having trouble x one week after falling. Pt. used ice and kept. it elevated.Has large amount of bruising. Minimal swelling.  Reviewed Health History In EMR: Yes  Reviewed Medications In EMR: Yes  Reviewed Allergies In EMR: Yes  Reviewed Surgeries / Procedures: Yes  Date of Onset of Symptoms: 01/29/2014  Treatments Tried: Ice  Treatments Tried Worked: Yes  Guideline(s) Used:  Leg Injury  Ankle and Foot Injury  Disposition Per Guideline:   See Today in Office  Reason For Disposition Reached:   Large swelling or bruise and size > palm of person's hand  Advice Given:  Call Back If:  Pain becomes severe  Pain or swelling lasts more than 2 weeks  You become worse.  Patient Will Follow Care Advice:  YES  Appointment Scheduled:  02/09/2014 10:00:00 Appointment Scheduled Provider:  Stevie Kern [facility])

## 2014-02-09 ENCOUNTER — Ambulatory Visit: Payer: Self-pay | Admitting: Family Medicine

## 2014-02-09 DIAGNOSIS — S8001XA Contusion of right knee, initial encounter: Secondary | ICD-10-CM | POA: Diagnosis not present

## 2014-03-02 DIAGNOSIS — S8001XD Contusion of right knee, subsequent encounter: Secondary | ICD-10-CM | POA: Diagnosis not present

## 2014-03-29 ENCOUNTER — Telehealth: Payer: Self-pay | Admitting: Internal Medicine

## 2014-03-29 NOTE — Telephone Encounter (Signed)
[facility] [facility] - Client [facility] Call [facility] Patient Name: [name] DOB: 1931-06-01 Initial Comment Caller states that she needs appointment due to constipation. Dr Raliegh Ip not listed Nurse Assessment Nurse: Markus Daft, RN, Sherre Poot Date/Time Eilene Ghazi Time): 03/29/2014 4:16:18 PM Confirm and document reason for call. If symptomatic, describe symptoms. ---Caller states that she needs appointment due to constipation. Last BM Sunday, but having to use laxatives over the last 3 wks. Has the patient traveled out of the country within the last 30 days? ---Not Applicable Does the patient require triage? ---Yes Related visit to physician within the last 2 weeks? ---No Does the PT have any chronic conditions? (i.e. diabetes, asthma, etc.) ---Yes List chronic conditions. ---Constipation one other time prior; back pain - on meds; Bipolar Guidelines Guideline Title Affirmed Question Affirmed Notes Constipation Unable to have a bowel movement (BM) without laxative or enema Final Disposition User See PCP When Office is Open (within 3 days) Markus Daft, Therapist, sports, [employer]

## 2014-03-29 NOTE — Telephone Encounter (Signed)
Pt has appt for 1.20.16.

## 2014-03-30 ENCOUNTER — Encounter: Payer: Self-pay | Admitting: Internal Medicine

## 2014-03-30 ENCOUNTER — Ambulatory Visit (INDEPENDENT_AMBULATORY_CARE_PROVIDER_SITE_OTHER): Payer: Medicare Other | Admitting: Internal Medicine

## 2014-03-30 VITALS — BP 152/80 | HR 79 | Temp 98.0°F | Resp 20 | Ht 65.5 in | Wt 183.0 lb

## 2014-03-30 DIAGNOSIS — K5900 Constipation, unspecified: Secondary | ICD-10-CM | POA: Diagnosis not present

## 2014-03-30 MED ORDER — POLYETHYLENE GLYCOL 3350 17 GM/SCOOP PO POWD: 17.0000 g | Freq: Two times a day (BID) | ORAL | Status: DC | PRN

## 2014-03-30 NOTE — Progress Notes (Signed)
Pre visit review using our clinic review tool, if applicable. No additional management support is needed unless otherwise documented below in the visit note. 

## 2014-03-30 NOTE — Patient Instructions (Addendum)
Constipation  Constipation is when a person has fewer than three bowel movements a week, has difficulty having a bowel movement, or has stools that are dry, hard, or larger than normal. As people grow older, constipation is more common. If you try to fix constipation with medicines that make you have a bowel movement (laxatives), the problem may get worse. Long-term laxative use may cause the muscles of the colon to become weak. A low-fiber diet, not taking in enough fluids, and taking certain medicines may make constipation worse.   CAUSES   · Certain medicines, such as antidepressants, pain medicine, iron supplements, antacids, and water pills.    · Certain diseases, such as diabetes, irritable bowel syndrome (IBS), thyroid disease, or depression.    · Not drinking enough water.    · Not eating enough fiber-rich foods.    · Stress or travel.    · Lack of physical activity or exercise.    · Ignoring the urge to have a bowel movement.    · Using laxatives too much.    SIGNS AND SYMPTOMS   · Having fewer than three bowel movements a week.    · Straining to have a bowel movement.    · Having stools that are hard, dry, or larger than normal.    · Feeling full or bloated.    · Pain in the lower abdomen.    · Not feeling relief after having a bowel movement.    DIAGNOSIS   Your health care provider will take a medical history and perform a physical exam. Further testing may be done for severe constipation. Some tests may include:  · A barium enema X-ray to examine your rectum, colon, and, sometimes, your small intestine.    · A sigmoidoscopy to examine your lower colon.    · A colonoscopy to examine your entire colon.  TREATMENT   Treatment will depend on the severity of your constipation and what is causing it. Some dietary treatments include drinking more fluids and eating more fiber-rich foods. Lifestyle treatments may include regular exercise. If these diet and lifestyle recommendations do not help, your health care  provider may recommend taking over-the-counter laxative medicines to help you have bowel movements. Prescription medicines may be prescribed if over-the-counter medicines do not work.   HOME CARE INSTRUCTIONS   · Eat foods that have a lot of fiber, such as fruits, vegetables, whole grains, and beans.  · Limit foods high in fat and processed sugars, such as french fries, hamburgers, cookies, candies, and soda.    · A fiber supplement may be added to your diet if you cannot get enough fiber from foods.    · Drink enough fluids to keep your urine clear or pale yellow.    · Exercise regularly or as directed by your health care provider.    · Go to the restroom when you have the urge to go. Do not hold it.    · Only take over-the-counter or prescription medicines as directed by your health care provider. Do not take other medicines for constipation without talking to your health care provider first.    SEEK IMMEDIATE MEDICAL CARE IF:   · You have bright red blood in your stool.    · Your constipation lasts for more than 4 days or gets worse.    · You have abdominal or rectal pain.    · You have thin, pencil-like stools.    · You have unexplained weight loss.  MAKE SURE YOU:   · Understand these instructions.  · Will watch your condition.  · Will get help right away if you are not   you have with your health care provider.   Call or return to clinic prn if these symptoms worsen or fail to improve as anticipated.

## 2014-03-30 NOTE — Progress Notes (Signed)
Subjective:    Patient ID: Tracy Rodriguez, female    DOB: [birth year], 81 y.o.   MRN: [id number]  HPI  81 year old patient who presents with a 3 week history of worsening constipation.  She has had intermittent issues in the past.  She has increased her fluid and fiber intake.  She has used laxatives and occasional enemas with nice benefit.  She states her last bowel movement was a week and a half ago following an enema.  No nausea, vomiting, abdominal pain, no new medications  No past medical history on file.  History   Social History  . Marital Status: Widowed    Spouse Name: N/A    Number of Children: N/A  . Years of Education: N/A   Occupational History  . Not on file.   Social History Main Topics  . Smoking status: Never Smoker   . Smokeless tobacco: Never Used  . Alcohol Use: No  . Drug Use: No  . Sexual Activity: Not on file   Other Topics Concern  . Not on file   Social History Narrative    No past surgical history on file.  No family history on file.  Allergies  Allergen Reactions  . Codeine Anaphylaxis  . Sulfa Antibiotics Rash    Current Outpatient Prescriptions on File Prior to Visit  Medication Sig Dispense Refill  . atorvastatin (LIPITOR) 20 MG tablet Take 1 tablet (20 mg total) by mouth daily. 90 tablet 1  . escitalopram (LEXAPRO) 10 MG tablet TAKE 1 TABLET BY MOUTH ONCE A DAY 90 tablet 3  . furosemide (LASIX) 40 MG tablet TAKE 1 TABLET BY MOUTH EVERY DAY 90 tablet 1  . gabapentin (NEURONTIN) 300 MG capsule TAKE ONE CAPSULE BY MOUTH TWICE DAILY 180 capsule 3  . nabumetone (RELAFEN) 750 MG tablet TAKE 1 TABLET BY MOUTH TWICE DAILY 180 tablet 1  . pseudoephedrine (SUDAFED) 30 MG tablet Take 30 mg by mouth as needed for congestion.    . QUEtiapine (SEROQUEL) 25 MG tablet TAKE 1 TABLET BY MOUTH THREE TIMES DAILY 270 tablet 1  . traMADol (ULTRAM) 50 MG tablet Take 1 tablet (50 mg total) by mouth 4 (four) times daily. 120 tablet 5  . traZODone (DESYREL)  150 MG tablet TAKE 1 TABLET BY MOUTH EVERY NIGHT AT BEDTIME 90 tablet 1   No current facility-administered medications on file prior to visit.    BP 152/80 mmHg  Pulse 79  Temp(Src) 98 F (36.7 C) (Oral)  Resp 20  Ht 5' 5.5" (1.664 m)  Wt 183 lb (83.008 kg)  BMI 29.98 kg/m2  SpO2 97%  LMP 10/10/1969     Review of Systems  Constitutional: Negative.   HENT: Negative for congestion, dental problem, hearing loss, rhinorrhea, sinus pressure, sore throat and tinnitus.   Eyes: Negative for pain, discharge and visual disturbance.  Respiratory: Negative for cough and shortness of breath.   Cardiovascular: Negative for chest pain, palpitations and leg swelling.  Gastrointestinal: Positive for constipation. Negative for nausea, vomiting, abdominal pain, diarrhea, blood in stool and abdominal distention.  Genitourinary: Negative for dysuria, urgency, frequency, hematuria, flank pain, vaginal bleeding, vaginal discharge, difficulty urinating, vaginal pain and pelvic pain.  Musculoskeletal: Negative for joint swelling, arthralgias and gait problem.  Skin: Negative for rash.  Neurological: Negative for dizziness, syncope, speech difficulty, weakness, numbness and headaches.  Hematological: Negative for adenopathy.  Psychiatric/Behavioral: Negative for behavioral problems, dysphoric mood and agitation. The patient is not nervous/anxious.  Objective:   Physical Exam  Constitutional: She is oriented to person, place, and time. She appears well-developed and well-nourished.  HENT:  Head: Normocephalic.  Right Ear: External ear normal.  Left Ear: External ear normal.  Mouth/Throat: Oropharynx is clear and moist.  Eyes: Conjunctivae and EOM are normal. Pupils are equal, round, and reactive to light.  Neck: Normal range of motion. Neck supple. No thyromegaly present.  Cardiovascular: Normal rate, regular rhythm, normal heart sounds and intact distal pulses.   Pulmonary/Chest: Effort  normal and breath sounds normal.  Abdominal: Soft. Bowel sounds are normal. She exhibits no distension and no mass. There is no tenderness. There is no rebound and no guarding.  Active bowel sounds.  No pain or distention, no mass  Musculoskeletal: Normal range of motion.  Lymphadenopathy:    She has no cervical adenopathy.  Neurological: She is alert and oriented to person, place, and time.  Skin: Skin is warm and dry. No rash noted.  Psychiatric: She has a normal mood and affect. Her behavior is normal.          Assessment & Plan:   Constipation.  Discussed at length and patient information dispensed.  Will start with MiraLAX twice a day and taper as necessary.  Will call if unimproved, will discontinue furosemide

## 2014-04-26 ENCOUNTER — Other Ambulatory Visit: Payer: Self-pay | Admitting: Internal Medicine

## 2014-05-16 ENCOUNTER — Ambulatory Visit (INDEPENDENT_AMBULATORY_CARE_PROVIDER_SITE_OTHER): Payer: Medicare Other | Admitting: Podiatry

## 2014-05-16 ENCOUNTER — Encounter: Payer: Self-pay | Admitting: Podiatry

## 2014-05-16 VITALS — BP 136/64 | HR 70 | Resp 12

## 2014-05-16 DIAGNOSIS — L600 Ingrowing nail: Secondary | ICD-10-CM

## 2014-05-16 NOTE — Patient Instructions (Signed)

## 2014-05-16 NOTE — Progress Notes (Signed)
   Subjective:    Patient ID: Tracy Rodriguez, female    DOB: [birth year], 81 y.o.   MRN: [id number]  HPI  PT STATED B/L GREAT TOENAIL AND RT FOOT 2ND TOENAIL BEEN SORE FOR 2 MONTHS. THE TOENAILS ARE GETTING WORSE AND GET AGGRAVATED BY WALKING OR WEARING CERTAIN TYPE OF SHOES. TRIED SOAK WITH EPSON  SALT AND IT HELP SOME.  Review of Systems  Skin: Positive for color change.       Objective:   Physical Exam        Assessment & Plan:

## 2014-05-17 NOTE — Progress Notes (Signed)
Subjective:     Patient ID: Tracy Rodriguez, female   DOB: 24-Aug-1931, 81 y.o.   MRN: [id number]  HPI patient presents stating she's getting painful ingrown toenail on her right big toe and that her left second nail is quite thick and she cannot cut and he gets painful on top. States that she's tried to trim them himself and soak without relief of symptoms   Review of Systems  All other systems reviewed and are negative.      Objective:   Physical Exam  Constitutional: She is oriented to person, place, and time.  Cardiovascular: Intact distal pulses.   Musculoskeletal: Normal range of motion.  Neurological: She is oriented to person, place, and time.  Skin: Skin is warm and dry.  Nursing note and vitals reviewed.  neurovascular status intact with muscle strength adequate and range of motion subtalar and midtarsal joint within normal limits. Patient's noted to have on the right big toe a incurvated lateral border and on the left second nail that's thick and painful when pressed and making it difficult to walk comfortably. Patient has good digital perfusion is well oriented 3 and has no equinus condition noted     Assessment:     Ingrown toenail deformity right hallux lateral border and damaged thickened second nail left over right that's painful when pressed    Plan:     H&P and conditions discussed. Reviewed different treatment options and I recommended nail correction and explained the procedure and risk. Patient wants surgery and today I went ahead and I infiltrated the right hallux 60 mg Xylocaine Marcaine mixture the left second toe 60 mg Xylocaine Marcaine mixture and remove the lateral border of the right hallux and the entire second nails exposing matrix and applying phenol 3 applications to the right for applications of left followed by alcohol lavage and sterile dressing. Gave instructions on soaks and reappoint

## 2014-07-07 ENCOUNTER — Ambulatory Visit (INDEPENDENT_AMBULATORY_CARE_PROVIDER_SITE_OTHER): Payer: Medicare Other | Admitting: Family Medicine

## 2014-07-07 ENCOUNTER — Encounter: Payer: Self-pay | Admitting: Family Medicine

## 2014-07-07 VITALS — BP 132/80 | HR 81 | Temp 97.5°F | Wt 185.0 lb

## 2014-07-07 DIAGNOSIS — R195 Other fecal abnormalities: Secondary | ICD-10-CM | POA: Diagnosis not present

## 2014-07-07 NOTE — Progress Notes (Signed)
Pre visit review using our clinic review tool, if applicable. No additional management support is needed unless otherwise documented below in the visit note. 

## 2014-07-07 NOTE — Patient Instructions (Signed)

## 2014-07-07 NOTE — Progress Notes (Signed)
   Subjective:    Patient ID: Tracy Rodriguez, female    DOB: [birth year], 81 y.o.   MRN: [id number]  HPI Patient is seen with 2 week history of change in stools. She describes having daily stools but states these are "pasty". She's not noted any bloody stools. No abdominal pain. No recent change of medications. No diarrhea. No abdominal cramping. No change in stool color. TSH in November was normal. She does not take any fiber supplements.  No past medical history on file. No past surgical history on file.  reports that she has never smoked. She has never used smokeless tobacco. She reports that she does not drink alcohol or use illicit drugs. family history is not on file. Allergies  Allergen Reactions  . Codeine Anaphylaxis  . Sulfa Antibiotics Rash      Review of Systems  Constitutional: Negative for appetite change and unexpected weight change.  Gastrointestinal: Negative for nausea, vomiting, abdominal pain, diarrhea, constipation and blood in stool.       Objective:   Physical Exam  Constitutional: She appears well-developed and well-nourished.  Cardiovascular: Normal rate and regular rhythm.   Pulmonary/Chest: Effort normal and breath sounds normal. No respiratory distress. She has no wheezes. She has no rales.  Abdominal: Soft. Bowel sounds are normal. She exhibits no distension and no mass. There is no tenderness. There is no rebound and no guarding.          Assessment & Plan:  Abnormal stools. She is not describing constipation but more change in consistency of stools. This may reflect inadequate fiber intake. Handout on fiber given. Recommend minimum of 25 g per day along with adequate fluids and walking as tolerated. She will consider supplement such as Metamucil or Citrucel

## 2014-07-13 ENCOUNTER — Telehealth: Payer: Self-pay | Admitting: Internal Medicine

## 2014-07-13 NOTE — Telephone Encounter (Signed)
Tried to contact pt unable to leave message mailbox full. Rx for Seroquel was filled on 2/16 and should have refills left on Tramadol.

## 2014-07-13 NOTE — Telephone Encounter (Signed)
Pt request refill QUEtiapine (SEROQUEL) 25 MG tablet  90 day 270 tabs traMADol (ULTRAM) 50 MG tablet  120 tab 30 day w/ refills  [facility]/ [address]

## 2014-07-14 MED ORDER — QUETIAPINE FUMARATE 25 MG PO TABS: 25.0000 mg | ORAL_TABLET | Freq: Three times a day (TID) | ORAL | Status: DC

## 2014-07-14 NOTE — Telephone Encounter (Signed)
Spoke to pt, told her I tried to call yesterday but mailbox was full. Told her Seroquel was filled on 2/16. Pt said yes but she needs a refill. Also asked pt if she had Rx for Tramadol that was given to her because it has 5 refills. Pt said yes she is good on that just needs Seroquel. Told pt okay will send Rx to pharmacy. Pt verbalized understanding. Rx sent.

## 2014-07-29 ENCOUNTER — Ambulatory Visit (INDEPENDENT_AMBULATORY_CARE_PROVIDER_SITE_OTHER): Payer: Medicare Other | Admitting: Internal Medicine

## 2014-07-29 ENCOUNTER — Encounter: Payer: Self-pay | Admitting: Internal Medicine

## 2014-07-29 VITALS — BP 150/80 | HR 69 | Temp 97.6°F | Resp 20 | Ht 65.5 in | Wt 187.0 lb

## 2014-07-29 DIAGNOSIS — R197 Diarrhea, unspecified: Secondary | ICD-10-CM | POA: Diagnosis not present

## 2014-07-29 NOTE — Progress Notes (Signed)
Pre visit review using our clinic review tool, if applicable. No additional management support is needed unless otherwise documented below in the visit note. 

## 2014-07-29 NOTE — Progress Notes (Signed)
Subjective:    Patient ID: Tracy Rodriguez, female    DOB: [birth year], 81 y.o.   MRN: [id number]  HPI 81 year old patient who is seen today with a chief complaint of change in her bowel habits.  This has been present now for about 6 weeks.  There is been no change in her medical regimen.  She states stools have been a bit softer and occur 2 times per day.  They have not improved with a high fiber diet.  No nocturnal symptoms.  Her appetite is well maintained.  No nausea, vomiting or abdominal pain.  Patient continues to take MiraLAX twice daily  Wt Readings from Last 3 Encounters:  07/29/14 187 lb (84.823 kg)  07/07/14 185 lb (83.915 kg)  03/30/14 183 lb (83.008 kg)    No past medical history on file.  History   Social History  . Marital Status: Widowed    Spouse Name: N/A  . Number of Children: N/A  . Years of Education: N/A   Occupational History  . Not on file.   Social History Main Topics  . Smoking status: Never Smoker   . Smokeless tobacco: Never Used  . Alcohol Use: No  . Drug Use: No  . Sexual Activity: Not on file   Other Topics Concern  . Not on file   Social History Narrative    No past surgical history on file.  No family history on file.  Allergies  Allergen Reactions  . Codeine Anaphylaxis  . Sulfa Antibiotics Rash    Current Outpatient Prescriptions on File Prior to Visit  Medication Sig Dispense Refill  . atorvastatin (LIPITOR) 20 MG tablet Take 1 tablet (20 mg total) by mouth daily. 90 tablet 1  . escitalopram (LEXAPRO) 10 MG tablet TAKE 1 TABLET BY MOUTH ONCE A DAY 90 tablet 3  . gabapentin (NEURONTIN) 300 MG capsule TAKE ONE CAPSULE BY MOUTH TWICE DAILY 180 capsule 3  . nabumetone (RELAFEN) 750 MG tablet TAKE 1 TABLET BY MOUTH TWICE DAILY 180 tablet 1  . polyethylene glycol powder (GLYCOLAX/MIRALAX) powder Take 17 g by mouth 2 (two) times daily as needed. 3350 g 4  . pseudoephedrine (SUDAFED) 30 MG tablet Take 30 mg by mouth as needed for  congestion.    . QUEtiapine (SEROQUEL) 25 MG tablet Take 1 tablet (25 mg total) by mouth 3 (three) times daily. 270 tablet 1  . traMADol (ULTRAM) 50 MG tablet Take 1 tablet (50 mg total) by mouth 4 (four) times daily. 120 tablet 5  . traZODone (DESYREL) 150 MG tablet TAKE 1 TABLET BY MOUTH EVERY NIGHT AT BEDTIME 90 tablet 1   No current facility-administered medications on file prior to visit.    BP 150/80 mmHg  Pulse 69  Temp(Src) 97.6 F (36.4 C) (Oral)  Resp 20  Ht 5' 5.5" (1.664 m)  Wt 187 lb (84.823 kg)  BMI 30.63 kg/m2  SpO2 97%  LMP 10/10/1969      Review of Systems  Constitutional: Negative.   HENT: Negative for congestion, dental problem, hearing loss, rhinorrhea, sinus pressure, sore throat and tinnitus.   Eyes: Negative for pain, discharge and visual disturbance.  Respiratory: Negative for cough and shortness of breath.   Cardiovascular: Negative for chest pain, palpitations and leg swelling.  Gastrointestinal: Positive for diarrhea. Negative for nausea, vomiting, abdominal pain, constipation, blood in stool and abdominal distention.  Genitourinary: Negative for dysuria, urgency, frequency, hematuria, flank pain, vaginal bleeding, vaginal discharge, difficulty urinating, vaginal pain and pelvic pain.  Musculoskeletal: Negative for joint swelling, arthralgias and gait problem.  Skin: Negative for rash.  Neurological: Negative for dizziness, syncope, speech difficulty, weakness, numbness and headaches.  Hematological: Negative for adenopathy.  Psychiatric/Behavioral: Negative for behavioral problems, dysphoric mood and agitation. The patient is not nervous/anxious.        Objective:   Physical Exam  Constitutional: She is oriented to person, place, and time. She appears well-developed and well-nourished.  HENT:  Head: Normocephalic.  Right Ear: External ear normal.  Left Ear: External ear normal.  Mouth/Throat: Oropharynx is clear and moist.  Eyes: Conjunctivae  and EOM are normal. Pupils are equal, round, and reactive to light.  Neck: Normal range of motion. Neck supple. No thyromegaly present.  Cardiovascular: Normal rate, regular rhythm, normal heart sounds and intact distal pulses.   Pulmonary/Chest: Effort normal and breath sounds normal.  Abdominal: Soft. Bowel sounds are normal. She exhibits no distension and no mass. There is no tenderness. There is no rebound.  Active bowel sounds  Musculoskeletal: Normal range of motion.  Lymphadenopathy:    She has no cervical adenopathy.  Neurological: She is alert and oriented to person, place, and time.  Skin: Skin is warm and dry. No rash noted.  Psychiatric: She has a normal mood and affect. Her behavior is normal.          Assessment & Plan:     Change in bowel habits.  Low suspicion for serious illness.  Will continue high-fiber diet and observe Dietary instructions dispensed

## 2014-07-29 NOTE — Patient Instructions (Addendum)
Continue high-fiber diet.  Discontinue MiraLAX  Food Choices to Help Relieve Diarrhea When you have diarrhea, the foods you eat and your eating habits are very important. Choosing the right foods and drinks can help relieve diarrhea. Also, because diarrhea can last up to 7 days, you need to replace lost fluids and electrolytes (such as sodium, potassium, and chloride) in order to help prevent dehydration.  WHAT GENERAL GUIDELINES DO I NEED TO FOLLOW?  Slowly drink 1 cup (8 oz) of fluid for each episode of diarrhea. If you are getting enough fluid, your urine will be clear or pale yellow.  Eat starchy foods. Some good choices include white rice, white toast, pasta, low-fiber cereal, baked potatoes (without the skin), saltine crackers, and bagels.  Avoid large servings of any cooked vegetables.  Limit fruit to two servings per day. A serving is  cup or 1 small piece.  Choose foods with less than 2 g of fiber per serving.  Limit fats to less than 8 tsp (38 g) per day.  Avoid fried foods.  Eat foods that have probiotics in them. Probiotics can be found in certain dairy products.  Avoid foods and beverages that may increase the speed at which food moves through the stomach and intestines (gastrointestinal tract). Things to avoid include:  High-fiber foods, such as dried fruit, raw fruits and vegetables, nuts, seeds, and whole grain foods.  Spicy foods and high-fat foods.  Foods and beverages sweetened with high-fructose corn syrup, honey, or sugar alcohols such as xylitol, sorbitol, and mannitol. WHAT FOODS ARE RECOMMENDED? Grains White rice. White, [place], or pita breads (fresh or toasted), including plain rolls, buns, or bagels. White pasta. Saltine, soda, or graham crackers. Pretzels. Low-fiber cereal. Cooked cereals made with water (such as cornmeal, farina, or cream cereals). Plain muffins. Matzo. Melba toast. Zwieback.  Vegetables Potatoes (without the skin). Strained tomato and  vegetable juices. Most well-cooked and canned vegetables without seeds. Tender lettuce. Fruits Cooked or canned applesauce, apricots, cherries, fruit cocktail, grapefruit, peaches, pears, or plums. Fresh bananas, apples without skin, cherries, grapes, cantaloupe, grapefruit, peaches, oranges, or plums.  Meat and Other Protein Products Baked or boiled chicken. Eggs. Tofu. Fish. Seafood. Smooth peanut butter. Ground or well-cooked tender beef, ham, veal, lamb, pork, or poultry.  Dairy Plain yogurt, kefir, and unsweetened liquid yogurt. Lactose-free milk, buttermilk, or soy milk. Plain hard cheese. Beverages Sport drinks. Clear broths. Diluted fruit juices (except prune). Regular, caffeine-free sodas such as ginger ale. Water. Decaffeinated teas. Oral rehydration solutions. Sugar-free beverages not sweetened with sugar alcohols. Other Bouillon, broth, or soups made from recommended foods.  The items listed above may not be a complete list of recommended foods or beverages. Contact your dietitian for more options. WHAT FOODS ARE NOT RECOMMENDED? Grains Whole grain, whole wheat, bran, or rye breads, rolls, pastas, crackers, and cereals. Wild or brown rice. Cereals that contain more than 2 g of fiber per serving. Corn tortillas or taco shells. Cooked or dry oatmeal. Granola. Popcorn. Vegetables Raw vegetables. Cabbage, broccoli, Brussels sprouts, artichokes, baked beans, beet greens, corn, kale, legumes, peas, sweet potatoes, and yams. Potato skins. Cooked spinach and cabbage. Fruits Dried fruit, including raisins and dates. Raw fruits. Stewed or dried prunes. Fresh apples with skin, apricots, mangoes, pears, raspberries, and strawberries.  Meat and Other Protein Products Chunky peanut butter. Nuts and seeds. Beans and lentils. Berniece Salines.  Dairy High-fat cheeses. Milk, chocolate milk, and beverages made with milk, such as milk shakes. Cream. Ice cream. Sweets and Desserts  Sweet rolls, doughnuts, and  sweet breads. Pancakes and waffles. Fats and Oils Butter. Cream sauces. Margarine. Salad oils. Plain salad dressings. Olives. Avocados.  Beverages Caffeinated beverages (such as coffee, tea, soda, or energy drinks). Alcoholic beverages. Fruit juices with pulp. Prune juice. Soft drinks sweetened with high-fructose corn syrup or sugar alcohols. Other Coconut. Hot sauce. Chili powder. Mayonnaise. Gravy. Cream-based or milk-based soups.  The items listed above may not be a complete list of foods and beverages to avoid. Contact your dietitian for more information. WHAT SHOULD I DO IF I BECOME DEHYDRATED? Diarrhea can sometimes lead to dehydration. Signs of dehydration include dark urine and dry mouth and skin. If you think you are dehydrated, you should rehydrate with an oral rehydration solution. These solutions can be purchased at pharmacies, retail stores, or online.  Drink -1 cup (120-240 mL) of oral rehydration solution each time you have an episode of diarrhea. If drinking this amount makes your diarrhea worse, try drinking smaller amounts more often. For example, drink 1-3 tsp (5-15 mL) every 5-10 minutes.  A general rule for staying hydrated is to drink 1-2 L of fluid per day. Talk to your health care provider about the specific amount you should be drinking each day. Drink enough fluids to keep your urine clear or pale yellow. Document Released: 05/18/2003 Document Revised: 03/02/2013 Document Reviewed: 01/18/2013 [facility] Patient Information 2015 [place], [place]. This information is not intended to replace advice given to you by your health care provider. Make sure you discuss any questions you have with your health care provider.

## 2014-08-01 ENCOUNTER — Ambulatory Visit: Payer: Medicare Other | Admitting: Internal Medicine

## 2014-08-03 ENCOUNTER — Telehealth: Payer: Self-pay | Admitting: Internal Medicine

## 2014-08-03 NOTE — Telephone Encounter (Signed)
Attempted to reach patient but mailbox was full. Tracy Rodriguez, please bring to [facility] attention and advise on plan of care.

## 2014-08-03 NOTE — Telephone Encounter (Signed)
[facility] - Client [place] Call Center Patient Name: Tracy Rodriguez DOB: [birth year] Initial Comment Caller states her mother has diarrhea and feels weak. Nurse Assessment Guidelines Guideline Title Affirmed Question Affirmed Notes Final Disposition User Clinical Call [place], RN, Felicia Comments actually mailbox was full - no message left. Dtr is at work - reached her could not reach patient - reached Dtr said she saw MD Friday about diarrhea - and saw her before that for alternating diarrhea and constipated. Diarrhea severe today. Dr. gave her laxative and Friday MD told her stop it. She is trying to follow a diet he gave her with lots of fruits and vegatables. Pt has memory issues. Dtr says that her voice is shakey. Since nurse could not reach her gave her some general infoP Told her if she is too weak to stand call 911 - if more confused than usual call 911 - told her if more than 6 diarrheal stools in 24 hours see MD in 4 hours. referenced diarrhea triage guide Unable to triage. Told her if she has slurred speech call 911 bc it is a lot easier to get dehydrated at this age. Told her if she has severe abdominal pain go to ER. Referenced diarrhea triage guide. Told her this is general advice that more specific advice could be givenif pt was present - told her call back when she gets there if she is not sure what to do - she is going to pts house. dtr also tried to reach pt unsucessfully while we were on the phoneCall Id: [id number]

## 2014-08-04 ENCOUNTER — Observation Stay (HOSPITAL_COMMUNITY)
Admission: EM | Admit: 2014-08-04 | Discharge: 2014-08-05 | Disposition: A | Discharge status: Home / Self Care | Payer: Medicare Other | Attending: Internal Medicine | Admitting: Internal Medicine

## 2014-08-04 ENCOUNTER — Encounter (HOSPITAL_COMMUNITY): Payer: Self-pay | Admitting: *Deleted

## 2014-08-04 ENCOUNTER — Observation Stay (HOSPITAL_COMMUNITY): Payer: Medicare Other

## 2014-08-04 ENCOUNTER — Emergency Department (HOSPITAL_COMMUNITY): Payer: Medicare Other

## 2014-08-04 DIAGNOSIS — I4891 Unspecified atrial fibrillation: Secondary | ICD-10-CM | POA: Diagnosis not present

## 2014-08-04 DIAGNOSIS — G8929 Other chronic pain: Secondary | ICD-10-CM | POA: Insufficient documentation

## 2014-08-04 DIAGNOSIS — R079 Chest pain, unspecified: Secondary | ICD-10-CM | POA: Diagnosis not present

## 2014-08-04 DIAGNOSIS — E669 Obesity, unspecified: Secondary | ICD-10-CM | POA: Diagnosis present

## 2014-08-04 DIAGNOSIS — F319 Bipolar disorder, unspecified: Secondary | ICD-10-CM | POA: Diagnosis not present

## 2014-08-04 DIAGNOSIS — Z885 Allergy status to narcotic agent status: Secondary | ICD-10-CM | POA: Diagnosis not present

## 2014-08-04 DIAGNOSIS — M549 Dorsalgia, unspecified: Secondary | ICD-10-CM | POA: Diagnosis not present

## 2014-08-04 DIAGNOSIS — R0789 Other chest pain: Secondary | ICD-10-CM | POA: Diagnosis not present

## 2014-08-04 DIAGNOSIS — Z882 Allergy status to sulfonamides status: Secondary | ICD-10-CM | POA: Insufficient documentation

## 2014-08-04 DIAGNOSIS — I160 Hypertensive urgency: Secondary | ICD-10-CM | POA: Insufficient documentation

## 2014-08-04 DIAGNOSIS — R002 Palpitations: Secondary | ICD-10-CM | POA: Diagnosis not present

## 2014-08-04 DIAGNOSIS — I10 Essential (primary) hypertension: Secondary | ICD-10-CM

## 2014-08-04 DIAGNOSIS — I480 Paroxysmal atrial fibrillation: Secondary | ICD-10-CM | POA: Diagnosis present

## 2014-08-04 HISTORY — DX: Essential (primary) hypertension: I10

## 2014-08-04 HISTORY — DX: Hyperlipidemia, unspecified: E78.5

## 2014-08-04 HISTORY — DX: Unspecified osteoarthritis, unspecified site: M19.90

## 2014-08-04 HISTORY — DX: Low back pain: M54.5

## 2014-08-04 HISTORY — DX: Malignant neoplasm of unspecified site of right female breast: C50.911

## 2014-08-04 HISTORY — DX: Other chronic pain: G89.29

## 2014-08-04 HISTORY — DX: Bipolar disorder, unspecified: F31.9

## 2014-08-04 HISTORY — DX: Low back pain, unspecified: M54.50

## 2014-08-04 LAB — CBC WITH DIFFERENTIAL/PLATELET
Basophils Absolute: 0 10*3/uL (ref 0.0–0.1)
Basophils Relative: 0 % (ref 0–1)
Eosinophils Absolute: 0.2 10*3/uL (ref 0.0–0.7)
Eosinophils Relative: 4 % (ref 0–5)
HCT: 37 % (ref 36.0–46.0)
Hemoglobin: 12 g/dL (ref 12.0–15.0)
Lymphocytes Relative: 25 % (ref 12–46)
Lymphs Abs: 1.3 10*3/uL (ref 0.7–4.0)
MCH: 28.6 pg (ref 26.0–34.0)
MCHC: 32.4 g/dL (ref 30.0–36.0)
MCV: 88.3 fL (ref 78.0–100.0)
Monocytes Absolute: 0.5 10*3/uL (ref 0.1–1.0)
Monocytes Relative: 9 % (ref 3–12)
Neutro Abs: 3.2 10*3/uL (ref 1.7–7.7)
Neutrophils Relative %: 62 % (ref 43–77)
Platelets: 197 10*3/uL (ref 150–400)
RBC: 4.19 MIL/uL (ref 3.87–5.11)
RDW: 16.3 % — ABNORMAL HIGH (ref 11.5–15.5)
WBC: 5.2 10*3/uL (ref 4.0–10.5)

## 2014-08-04 LAB — I-STAT CHEM 8, ED
BUN: 22 mg/dL — ABNORMAL HIGH (ref 6–20)
Calcium, Ion: 1.17 mmol/L (ref 1.13–1.30)
Chloride: 107 mmol/L (ref 101–111)
Creatinine, Ser: 1.3 mg/dL — ABNORMAL HIGH (ref 0.44–1.00)
Glucose, Bld: 119 mg/dL — ABNORMAL HIGH (ref 65–99)
HCT: 39 % (ref 36.0–46.0)
Hemoglobin: 13.3 g/dL (ref 12.0–15.0)
Potassium: 3.6 mmol/L (ref 3.5–5.1)
Sodium: 144 mmol/L (ref 135–145)
TCO2: 22 mmol/L (ref 0–100)

## 2014-08-04 LAB — URINALYSIS, ROUTINE W REFLEX MICROSCOPIC
Bilirubin Urine: NEGATIVE
Glucose, UA: NEGATIVE mg/dL
Hgb urine dipstick: NEGATIVE
Ketones, ur: NEGATIVE mg/dL
Nitrite: NEGATIVE
Protein, ur: NEGATIVE mg/dL
Specific Gravity, Urine: 1.007 (ref 1.005–1.030)
Urobilinogen, UA: 0.2 mg/dL (ref 0.0–1.0)
pH: 7 (ref 5.0–8.0)

## 2014-08-04 LAB — URINE MICROSCOPIC-ADD ON

## 2014-08-04 LAB — TROPONIN I
Troponin I: 0.03 ng/mL (ref <0.031)
Troponin I: 0.03 ng/mL (ref <0.031)
Troponin I: 0.04 ng/mL — ABNORMAL HIGH (ref <0.031)

## 2014-08-04 LAB — TSH: TSH: 2.774 u[IU]/mL (ref 0.350–4.500)

## 2014-08-04 LAB — BRAIN NATRIURETIC PEPTIDE: B Natriuretic Peptide: 262.1 pg/mL — ABNORMAL HIGH (ref 0.0–100.0)

## 2014-08-04 LAB — I-STAT TROPONIN, ED: Troponin i, poc: 0 ng/mL (ref 0.00–0.08)

## 2014-08-04 MED ORDER — PSEUDOEPHEDRINE HCL 30 MG PO TABS
30.0000 mg | ORAL_TABLET | Freq: Four times a day (QID) | ORAL | Status: DC | PRN
Start: 2014-08-04 — End: 2014-08-05
  Filled 2014-08-04: qty 1

## 2014-08-04 MED ORDER — ENOXAPARIN SODIUM 100 MG/ML ~~LOC~~ SOLN
85.0000 mg | Freq: Once | SUBCUTANEOUS | Status: AC
  Administered 2014-08-04: 85 mg via SUBCUTANEOUS
  Filled 2014-08-04 (×2): qty 1

## 2014-08-04 MED ORDER — ASPIRIN EC 325 MG PO TBEC
325.0000 mg | DELAYED_RELEASE_TABLET | Freq: Every day | ORAL | Status: DC
  Administered 2014-08-04 – 2014-08-05 (×2): 325 mg via ORAL
  Filled 2014-08-04 (×2): qty 1

## 2014-08-04 MED ORDER — QUETIAPINE FUMARATE 25 MG PO TABS
25.0000 mg | ORAL_TABLET | Freq: Three times a day (TID) | ORAL | Status: DC
  Administered 2014-08-04 – 2014-08-05 (×4): 25 mg via ORAL
  Filled 2014-08-04 (×6): qty 1

## 2014-08-04 MED ORDER — FUROSEMIDE 10 MG/ML IJ SOLN
20.0000 mg | Freq: Once | INTRAMUSCULAR | Status: AC
  Administered 2014-08-04: 20 mg via INTRAVENOUS
  Filled 2014-08-04: qty 2

## 2014-08-04 MED ORDER — GABAPENTIN 300 MG PO CAPS
300.0000 mg | ORAL_CAPSULE | Freq: Two times a day (BID) | ORAL | Status: DC
  Administered 2014-08-04 – 2014-08-05 (×3): 300 mg via ORAL
  Filled 2014-08-04 (×4): qty 1

## 2014-08-04 MED ORDER — DILTIAZEM HCL 60 MG PO TABS
60.0000 mg | ORAL_TABLET | Freq: Once | ORAL | Status: DC
  Filled 2014-08-04: qty 1

## 2014-08-04 MED ORDER — ATORVASTATIN CALCIUM 20 MG PO TABS
20.0000 mg | ORAL_TABLET | Freq: Every day | ORAL | Status: DC
  Administered 2014-08-04 – 2014-08-05 (×2): 20 mg via ORAL
  Filled 2014-08-04 (×2): qty 1

## 2014-08-04 MED ORDER — ASPIRIN 81 MG PO CHEW
324.0000 mg | CHEWABLE_TABLET | Freq: Once | ORAL | Status: AC
  Administered 2014-08-04: 324 mg via ORAL
  Filled 2014-08-04: qty 4

## 2014-08-04 MED ORDER — ENOXAPARIN SODIUM 100 MG/ML ~~LOC~~ SOLN
85.0000 mg | Freq: Two times a day (BID) | SUBCUTANEOUS | Status: DC
  Filled 2014-08-04 (×2): qty 1

## 2014-08-04 MED ORDER — DILTIAZEM HCL 25 MG/5ML IV SOLN
10.0000 mg | Freq: Once | INTRAVENOUS | Status: AC
  Administered 2014-08-04: 10 mg via INTRAVENOUS
  Filled 2014-08-04: qty 5

## 2014-08-04 MED ORDER — ONDANSETRON HCL 4 MG/2ML IJ SOLN: 4.0000 mg | Freq: Four times a day (QID) | INTRAMUSCULAR | Status: DC | PRN

## 2014-08-04 MED ORDER — TRAMADOL HCL 50 MG PO TABS
50.0000 mg | ORAL_TABLET | Freq: Four times a day (QID) | ORAL | Status: DC
  Administered 2014-08-04 – 2014-08-05 (×5): 50 mg via ORAL
  Filled 2014-08-04 (×5): qty 1

## 2014-08-04 MED ORDER — ACETAMINOPHEN 325 MG PO TABS: 650.0000 mg | ORAL_TABLET | ORAL | Status: DC | PRN

## 2014-08-04 MED ORDER — APIXABAN 5 MG PO TABS
5.0000 mg | ORAL_TABLET | Freq: Two times a day (BID) | ORAL | Status: DC
  Administered 2014-08-04 – 2014-08-05 (×2): 5 mg via ORAL
  Filled 2014-08-04 (×3): qty 1

## 2014-08-04 MED ORDER — DILTIAZEM HCL 60 MG PO TABS
60.0000 mg | ORAL_TABLET | Freq: Three times a day (TID) | ORAL | Status: DC
  Administered 2014-08-04 – 2014-08-05 (×3): 60 mg via ORAL
  Filled 2014-08-04 (×8): qty 1

## 2014-08-04 NOTE — H&P (Signed)
PCP:   [name], MD   Chief Complaint:  Chest pain  HPI: 81 yo female h/o htn, bipolar disorder, chronic back pain awoke from sleep tonight with sscp that radiated down her left arm.  No associated n/v.  No palpitations.  No sob.  No swelling in her legs.  Pt denies any recent illnesses.  No cough or fevers.  She called ems.  Found to be in afib w rvr.  Pain resolved spontaneously in the ED.  She was given in afib w rvr rate was in 140s given cardizem bolus and now rate in 60s but still in afib.  She is now cp free.  No prior h/o chf, CAD.  She ambulates with any assistance at her baseline.  No frequent falls.  Review of Systems:  Positive and negative as per HPI otherwise all other systems are negative  Past Medical History: Past Medical History  Diagnosis Date  . Hypertension   . Bipolar 1 disorder    Past Surgical History  Procedure Laterality Date  . Back surgery    . Cataract extraction      Medications: Prior to Admission medications   Medication Sig Start Date End Date Taking? Authorizing Provider  atorvastatin (LIPITOR) 20 MG tablet Take 1 tablet (20 mg total) by mouth daily. 01/20/14  Yes Marletta Lor, MD  escitalopram (LEXAPRO) 10 MG tablet TAKE 1 TABLET BY MOUTH ONCE A DAY 01/20/14  Yes Marletta Lor, MD  gabapentin (NEURONTIN) 300 MG capsule TAKE ONE CAPSULE BY MOUTH TWICE DAILY   Yes Marletta Lor, MD  nabumetone (RELAFEN) 750 MG tablet TAKE 1 TABLET BY MOUTH TWICE DAILY 01/11/14  Yes Marletta Lor, MD  pseudoephedrine (SUDAFED) 30 MG tablet Take 30 mg by mouth as needed for congestion.   Yes Historical Provider, MD  QUEtiapine (SEROQUEL) 25 MG tablet Take 1 tablet (25 mg total) by mouth 3 (three) times daily. 07/14/14  Yes Marletta Lor, MD  traMADol (ULTRAM) 50 MG tablet Take 1 tablet (50 mg total) by mouth 4 (four) times daily. 01/20/14  Yes Marletta Lor, MD  traZODone (DESYREL) 150 MG tablet TAKE 1 TABLET BY MOUTH  EVERY NIGHT AT BEDTIME 01/20/14  Yes Marletta Lor, MD    Allergies:   Allergies  Allergen Reactions  . Codeine Anaphylaxis  . Sulfa Antibiotics Rash    Social History:  reports that she has never smoked. She has never used smokeless tobacco. She reports that she does not drink alcohol or use illicit drugs.  Family History: History reviewed. No pertinent family history.  Physical Exam: Filed Vitals:   08/04/14 0353 08/04/14 0426 08/04/14 0430  BP: 152/104 140/61 136/76  Pulse: 104 79 64  Temp: 97.8 F (36.6 C)    Resp: 16 16 15   SpO2: 97% 96% 93%   General appearance: alert, cooperative and no distress Head: Normocephalic, without obvious abnormality, atraumatic Eyes: negative Nose: Nares normal. Septum midline. Mucosa normal. No drainage or sinus tenderness. Neck: no JVD and supple, symmetrical, trachea midline Lungs: clear to auscultation bilaterally Heart: regular rate and rhythm, S1, S2 normal, no murmur, click, rub or gallop Abdomen: soft, non-tender; bowel sounds normal; no masses,  no organomegaly Extremities: extremities normal, atraumatic, no cyanosis or edema Pulses: 2+ and symmetric Skin: Skin color, texture, turgor normal. No rashes or lesions Neurologic: Grossly normal  Labs on Admission:   Recent Labs  08/04/14 0359  NA 144  K 3.6  CL 107  GLUCOSE 119*  BUN 22*  CREATININE 1.30*    Recent Labs  08/04/14 0354 08/04/14 0359  WBC 5.2  --   NEUTROABS 3.2  --   HGB 12.0 13.3  HCT 37.0 39.0  MCV 88.3  --   PLT 197  --    Radiological Exams on Admission: No results found.  ekg and cxr reviewed by myself.     Assessment/Plan  81 yo female with sscp and new onset atrial fib with RVR  Principal Problem:   Chest pain at rest-  Initial trop normal, ekg afib w rvr no acute changes.  obs on tele floor.  Serial troponin.  Place on full dose lovenox.  Check 2 D echo in am.  Cardiology has been consulted for further evaluation.  She is  chest pain free now.  Monitor closely for any recurrence of chest pain or arrythmia.  Active Problems:   Chronic back pain- stable   Bipolar disorder-  stable   Obesity-  noted   Hypertension-  stable   Atrial fibrillation with RVR-  Rate now in the 60s.  Will orally load with cardizem po.  Long term anticoagulation to be addressed prior to discharge.  CHADS2 score is 2.  Check TSH.  Observe on telemetry bed.  FULL CODE.   Tracy Rodriguez,Tracy Rodriguez A 08/04/2014, 4:49 AM

## 2014-08-04 NOTE — ED Notes (Signed)
Pt to ED via [facility] from [place] c/o palpitations. Pt c/o central chest pain that woke her up from sleep; radiating down L arm and into back; associated with increased shortness of breath. Pt noted to be in afib; denies history of same

## 2014-08-04 NOTE — Discharge Instructions (Signed)

## 2014-08-04 NOTE — Telephone Encounter (Signed)
Please see message and advise if need to do anything else.

## 2014-08-04 NOTE — Progress Notes (Signed)
ANTICOAGULATION CONSULT NOTE - Initial Consult  Pharmacy Consult for Lovenox Indication: chest pain/ACS and atrial fibrillation  Allergies  Allergen Reactions  . Codeine Anaphylaxis  . Sulfa Antibiotics Rash    Patient Measurements: Height: 5' 5.5" (166.4 cm) Weight: 187 lb (84.823 kg) IBW/kg (Calculated) : 58.15  Vital Signs: Temp: 97.8 F (36.6 C) (05/26 0353) BP: 146/75 mmHg (05/26 0530) Pulse Rate: 59 (05/26 0530)  Labs:  Recent Labs  08/04/14 0354 08/04/14 0359  HGB 12.0 13.3  HCT 37.0 39.0  PLT 197  --   CREATININE  --  1.30*    Estimated Creatinine Clearance: 36.9 mL/min (by C-G formula based on Cr of 1.3).   Medical History: Past Medical History  Diagnosis Date  . Hypertension   . Bipolar 1 disorder     Medications:  Prescriptions prior to admission  Medication Sig Dispense Refill Last Dose  . atorvastatin (LIPITOR) 20 MG tablet Take 1 tablet (20 mg total) by mouth daily. 90 tablet 1 08/03/2014 at Unknown time  . escitalopram (LEXAPRO) 10 MG tablet TAKE 1 TABLET BY MOUTH ONCE A DAY 90 tablet 3 08/03/2014 at Unknown time  . gabapentin (NEURONTIN) 300 MG capsule TAKE ONE CAPSULE BY MOUTH TWICE DAILY 180 capsule 3 08/03/2014 at Unknown time  . nabumetone (RELAFEN) 750 MG tablet TAKE 1 TABLET BY MOUTH TWICE DAILY 180 tablet 1 08/03/2014 at Unknown time  . pseudoephedrine (SUDAFED) 30 MG tablet Take 30 mg by mouth as needed for congestion.   unk  . QUEtiapine (SEROQUEL) 25 MG tablet Take 1 tablet (25 mg total) by mouth 3 (three) times daily. 270 tablet 1 08/03/2014 at Unknown time  . traMADol (ULTRAM) 50 MG tablet Take 1 tablet (50 mg total) by mouth 4 (four) times daily. 120 tablet 5 08/03/2014 at Unknown time  . traZODone (DESYREL) 150 MG tablet TAKE 1 TABLET BY MOUTH EVERY NIGHT AT BEDTIME 90 tablet 1 08/03/2014 at Unknown time   Scheduled:  . aspirin EC  325 mg Oral Daily  . atorvastatin  20 mg Oral Daily  . diltiazem  60 mg Oral Once  . diltiazem  60 mg  Oral 3 times per day  . gabapentin  300 mg Oral BID  . QUEtiapine  25 mg Oral TID  . traMADol  50 mg Oral QID    Assessment: 81yo female c/o palpitations and radiating central CP awakening her from sleep, found in ED to be in Afib w/ RVR, to begin LMWH.  Goal of Therapy:  Anti-Xa level 0.6-1 units/ml 4hrs after LMWH dose given Monitor platelets by anticoagulation protocol: Yes   Plan:  Will begin Lovenox 85mg  SQ Q12H and monitor CBC; will f/u on long-term anticoag plans (CHADS2 score 2).  Wynona Neat, PharmD, BCPS  08/04/2014,5:55 AM

## 2014-08-04 NOTE — Progress Notes (Signed)
Patient converted to NSR/SB this evening. HR between 57 and 63, on call MD page to verify about giving her Cardizem, verbally responded to hold if HR is less than 60. Order carried out.

## 2014-08-04 NOTE — Care Management Note (Signed)
Case Management Note  Patient Details  Name: Tracy Rodriguez MRN: [id number] Date of Birth: [birth year]  Subjective/Objective:        Pt admitted with chest pain at rest            Action/Plan:  Pt has been placed on Eliquis.  CM will provide assistance with Eliquis.     Expected Discharge Date:                  Expected Discharge Plan:  Home/Self Care  In-House Referral:     Discharge planning Services  CM Consult, Medication Assistance  Post Acute Care Choice:    Choice offered to:     DME Arranged:    DME Agency:     HH Arranged:    [place] Agency:     Status of Service:  Completed, signed off  Medicare Important Message Given:   No Date Medicare IM Given:    Medicare IM give by:    Date Additional Medicare IM Given:    Additional Medicare Important Message give by:     If discussed at [place] of Stay Meetings, dates discussed:    Disposition Plan: Home/self care  Additional Comments: 08/04/14 Elenor Quinones, RN, BSN [phone]. CM was informed by Pharmacy that pt is new with Eliquis.  CM submitted benift check for Eliquis.  CM provided free 30 day card of Eliquis.  Pt informed nurse that [employer] on [address] [employer] contacted pharmacy and was informed that prescription 5mg  BID will be available for pick up.  CM informed pt that approximate copay for medication per month would be approximately $111.  Pt family at bedside; stated the copay is acceptable.  No additional CM needs at this time.    Maryclare Labrador, RN 08/04/2014, 4:41 PM

## 2014-08-04 NOTE — ED Provider Notes (Signed)
CSN: [id number]     Arrival date & time 08/04/14  0335 History   First MD Initiated Contact with Patient 08/04/14 [phone]     Chief Complaint  Patient presents with  . Atrial Fibrillation    (Consider location/radiation/quality/duration/timing/severity/associated sxs/prior Treatment) HPI Comments: Patient is an 81 year old female with a history of dyslipidemia, bipolar disorder, chronic back pain. She presents to the emergency department for further evaluation of chest pain. Patient states that chest pain woke her from sleep. She noticed the pain more when she got up to void. She reports that the pain is dull and aching. It radiated down her left arm and into her back. Pain originated in her left chest. Triage note reports associated increased shortness of breath; however, patient denies this during my encounter with her. She is noted to be in atrial fibrillation on arrival. She denies a history of this as well as a history of ACS. She denies ever being followed by a cardiologist. Patient is not currently on any blood thinners. She denies associated fever, diaphoresis, syncope or near-syncope, abdominal pain, nausea, vomiting, or dysuria. She currently lives in [place].   FHx - Father died at 15 of an MI; mother died at 39 s/p CVA, history of breast cancer at 77  Patient is a 81 y.o. female presenting with atrial fibrillation. The history is provided by the patient. No language interpreter was used.  Atrial Fibrillation Associated symptoms include chest pain. Pertinent negatives include no abdominal pain, fever, nausea or vomiting.    Past Medical History  Diagnosis Date  . Hypertension   . Bipolar 1 disorder    Past Surgical History  Procedure Laterality Date  . Back surgery    . Cataract extraction     History reviewed. No pertinent family history. History  Substance Use Topics  . Smoking status: Never Smoker   . Smokeless tobacco: Never Used  . Alcohol Use: No   OB History    No data available      Review of Systems  Constitutional: Negative for fever.  Respiratory: Positive for shortness of breath.   Cardiovascular: Positive for chest pain and palpitations.  Gastrointestinal: Negative for nausea, vomiting and abdominal pain.  Neurological: Negative for syncope and light-headedness.  All other systems reviewed and are negative.   Allergies  Codeine and Sulfa antibiotics  Home Medications   Prior to Admission medications   Medication Sig Start Date End Date Taking? Authorizing Provider  atorvastatin (LIPITOR) 20 MG tablet Take 1 tablet (20 mg total) by mouth daily. 01/20/14  Yes Marletta Lor, MD  escitalopram (LEXAPRO) 10 MG tablet TAKE 1 TABLET BY MOUTH ONCE A DAY 01/20/14  Yes Marletta Lor, MD  gabapentin (NEURONTIN) 300 MG capsule TAKE ONE CAPSULE BY MOUTH TWICE DAILY   Yes Marletta Lor, MD  nabumetone (RELAFEN) 750 MG tablet TAKE 1 TABLET BY MOUTH TWICE DAILY 01/11/14  Yes Marletta Lor, MD  pseudoephedrine (SUDAFED) 30 MG tablet Take 30 mg by mouth as needed for congestion.   Yes Historical Provider, MD  QUEtiapine (SEROQUEL) 25 MG tablet Take 1 tablet (25 mg total) by mouth 3 (three) times daily. 07/14/14  Yes Marletta Lor, MD  traMADol (ULTRAM) 50 MG tablet Take 1 tablet (50 mg total) by mouth 4 (four) times daily. 01/20/14  Yes Marletta Lor, MD  traZODone (DESYREL) 150 MG tablet TAKE 1 TABLET BY MOUTH EVERY NIGHT AT BEDTIME 01/20/14  Yes Marletta Lor, MD   BP 136/76  mmHg  Pulse 64  Temp(Src) 97.8 F (36.6 C)  Resp 15  SpO2 93%  LMP 10/10/1969   Physical Exam  Constitutional: She is oriented to person, place, and time. She appears well-developed and well-nourished. No distress.  Nontoxic/nonseptic appearing  HENT:  Head: Normocephalic and atraumatic.  Eyes: Conjunctivae and EOM are normal. No scleral icterus.  Neck: Normal range of motion.  Cardiovascular: An irregularly irregular rhythm  present. Tachycardia present.   Rate 104-130's  Pulmonary/Chest: Effort normal and breath sounds normal. No respiratory distress. She has no wheezes. She has no rales.  Respirations even and unlabored. Lungs clear.  Abdominal: Soft. She exhibits no distension. There is no tenderness. There is no rebound and no guarding.  Soft, nontender abdomen. No masses or peritoneal signs.  Musculoskeletal: Normal range of motion.  Neurological: She is alert and oriented to person, place, and time. She exhibits normal muscle tone. Coordination normal.  GCS 15. Patient moving extremities without ataxia. She answers questions appropriately and follows simple commands.  Skin: Skin is warm and dry. No rash noted. She is not diaphoretic. No erythema. No pallor.  Psychiatric: She has a normal mood and affect. Her behavior is normal.  Nursing note and vitals reviewed.   ED Course  Procedures (including critical care time) Labs Review Labs Reviewed  CBC WITH DIFFERENTIAL/PLATELET - Abnormal; Notable for the following:    RDW 16.3 (*)    All other components within normal limits  I-STAT CHEM 8, ED - Abnormal; Notable for the following:    BUN 22 (*)    Creatinine, Ser 1.30 (*)    Glucose, Bld 119 (*)    All other components within normal limits  BRAIN NATRIURETIC PEPTIDE  I-STAT TROPOININ, ED    Imaging Review No results found.   EKG Interpretation   Date/Time:  Thursday Aug 04 2014 04:03:52 EDT Ventricular Rate:  125 PR Interval:    QRS Duration: 85 QT Interval:  324 QTC Calculation: 467 R Axis:   -27 Text Interpretation:  Atrial fibrillation Borderline left axis deviation  Confirmed by Glynn Octave [phone]) on 08/04/2014 4:30:17 AM       CRITICAL CARE Performed by: Antonietta Breach   Total critical care time: 31  Critical care time was exclusive of separately billable procedures and treating other patients.  Critical care was necessary to treat or prevent imminent or  life-threatening deterioration.  Critical care was time spent personally by me on the following activities: development of treatment plan with patient and/or surrogate as well as nursing, discussions with consultants, evaluation of patient's response to treatment, examination of patient, obtaining history from patient or surrogate, ordering and performing treatments and interventions, ordering and review of laboratory studies, ordering and review of radiographic studies, pulse oximetry and re-evaluation of patient's condition.   Medications  diltiazem (CARDIZEM) tablet 60 mg (not administered)  aspirin chewable tablet 324 mg (not administered)  diltiazem (CARDIZEM) injection 10 mg (10 mg Intravenous Given 08/04/14 0422)    MDM   Final diagnoses:  Palpitations  New onset atrial fibrillation  Chest pain, unspecified chest pain type    81 year old female presents to the emergency department for further evaluation of chest pain radiating to her back and left arm. Chest pain has resolved. Patient found to be in atrial fibrillation on EMS arrival with a rate of 103 to 130's. Tachycardic rate improved with Cardizem given in ED. Initial troponin negative. EKG is nonischemic. Patient resting comfortably. Will admit to [facility] for further management.  Dr. Claiborne Billings of cardiology also consulted. He will come to assess the patient in consult. Dr. Shanon Brow of [facility] requested 324mg  ASA and additional 60mg  PO Cardizem be ordered. Patient was NOT given ASA by EMS prior to arrival, per nursing report.   Filed Vitals:   08/04/14 0353 08/04/14 0426 08/04/14 0430  BP: 152/104 140/61 136/76  Pulse: 104 79 64  Temp: 97.8 F (36.6 C)    Resp: 16 16 15   SpO2: 97% 96% 93%     Antonietta Breach, PA-C 08/04/14 2458  Everlene Balls, MD 08/04/14 [phone]

## 2014-08-04 NOTE — Consult Note (Signed)
Reason for Consult: new onset atrial fibrillation Primary Cardiologist: new Referring Physician: Dr. Harriet Pho is an 81 y.o. female.  HPI: Tracy Rodriguez is an 81 yo woman with PMH of bipolar disorder and chronic back pain who woke up with chest discomfort tonight. She says the pain radiated down her left arm. She called 911 and in the ER she was found to have atrial fibrillation with RVR. The pain resolved in the ER after receiving cardizem gtt. She has no history of CAD, no know family history and no recent infectious symptoms such as fever/chlls/nausea/vomiting. She tells me she may have been having leg swelling for the last few weeks with some slightly increased abdominal fullness and shortness of breath.    Past Medical History  Diagnosis Date  . Hypertension   . Bipolar 1 disorder     Past Surgical History  Procedure Laterality Date  . Back surgery    . Cataract extraction      History reviewed. No pertinent family history.  Social History:  reports that she has never smoked. She has never used smokeless tobacco. She reports that she does not drink alcohol or use illicit drugs.  Allergies:  Allergies  Allergen Reactions  . Codeine Anaphylaxis  . Sulfa Antibiotics Rash    Medications:  I have reviewed the patient's current medications. Prior to Admission:  Prescriptions prior to admission  Medication Sig Dispense Refill Last Dose  . atorvastatin (LIPITOR) 20 MG tablet Take 1 tablet (20 mg total) by mouth daily. 90 tablet 1 08/03/2014 at Unknown time  . escitalopram (LEXAPRO) 10 MG tablet TAKE 1 TABLET BY MOUTH ONCE A DAY 90 tablet 3 08/03/2014 at Unknown time  . gabapentin (NEURONTIN) 300 MG capsule TAKE ONE CAPSULE BY MOUTH TWICE DAILY 180 capsule 3 08/03/2014 at Unknown time  . nabumetone (RELAFEN) 750 MG tablet TAKE 1 TABLET BY MOUTH TWICE DAILY 180 tablet 1 08/03/2014 at Unknown time  . pseudoephedrine (SUDAFED) 30 MG tablet Take 30 mg by mouth as needed  for congestion.   unk  . QUEtiapine (SEROQUEL) 25 MG tablet Take 1 tablet (25 mg total) by mouth 3 (three) times daily. 270 tablet 1 08/03/2014 at Unknown time  . traMADol (ULTRAM) 50 MG tablet Take 1 tablet (50 mg total) by mouth 4 (four) times daily. 120 tablet 5 08/03/2014 at Unknown time  . traZODone (DESYREL) 150 MG tablet TAKE 1 TABLET BY MOUTH EVERY NIGHT AT BEDTIME 90 tablet 1 08/03/2014 at Unknown time   Scheduled: . aspirin EC  325 mg Oral Daily  . atorvastatin  20 mg Oral Daily  . diltiazem  60 mg Oral Once  . diltiazem  60 mg Oral 3 times per day  . gabapentin  300 mg Oral BID  . QUEtiapine  25 mg Oral TID  . traMADol  50 mg Oral QID    Results for orders placed or performed during the hospital encounter of 08/04/14 (from the past 48 hour(s))  CBC with Differential     Status: Abnormal   Collection Time: 08/04/14  3:54 AM  Result Value Ref Range   WBC 5.2 4.0 - 10.5 K/uL   RBC 4.19 3.87 - 5.11 MIL/uL   Hemoglobin 12.0 12.0 - 15.0 g/dL   HCT 37.0 36.0 - 46.0 %   MCV 88.3 78.0 - 100.0 fL   MCH 28.6 26.0 - 34.0 pg   MCHC 32.4 30.0 - 36.0 g/dL   RDW 16.3 (H) 11.5 - 15.5 %  Platelets 197 150 - 400 K/uL   Neutrophils Relative % 62 43 - 77 %   Neutro Abs 3.2 1.7 - 7.7 K/uL   Lymphocytes Relative 25 12 - 46 %   Lymphs Abs 1.3 0.7 - 4.0 K/uL   Monocytes Relative 9 3 - 12 %   Monocytes Absolute 0.5 0.1 - 1.0 K/uL   Eosinophils Relative 4 0 - 5 %   Eosinophils Absolute 0.2 0.0 - 0.7 K/uL   Basophils Relative 0 0 - 1 %   Basophils Absolute 0.0 0.0 - 0.1 K/uL  Brain natriuretic peptide     Status: Abnormal   Collection Time: 08/04/14  3:54 AM  Result Value Ref Range   B Natriuretic Peptide 262.1 (H) 0.0 - 100.0 pg/mL  I-stat troponin, ED     Status: None   Collection Time: 08/04/14  3:57 AM  Result Value Ref Range   Troponin i, poc 0.00 0.00 - 0.08 ng/mL   Comment 3            Comment: Due to the release kinetics of cTnI, a negative result within the first hours of the  onset of symptoms does not rule out myocardial infarction with certainty. If myocardial infarction is still suspected, repeat the test at appropriate intervals.   I-stat chem 8, ed     Status: Abnormal   Collection Time: 08/04/14  3:59 AM  Result Value Ref Range   Sodium 144 135 - 145 mmol/L   Potassium 3.6 3.5 - 5.1 mmol/L   Chloride 107 101 - 111 mmol/L   BUN 22 (H) 6 - 20 mg/dL   Creatinine, Ser 1.30 (H) 0.44 - 1.00 mg/dL   Glucose, Bld 119 (H) 65 - 99 mg/dL   Calcium, Ion 1.17 1.13 - 1.30 mmol/L   TCO2 22 0 - 100 mmol/L   Hemoglobin 13.3 12.0 - 15.0 g/dL   HCT 39.0 36.0 - 46.0 %    Dg Chest Port 1 View  08/04/2014   CLINICAL DATA:  Palpitations and chest pain  EXAM: PORTABLE CHEST - 1 VIEW  COMPARISON:  11/20/2012  FINDINGS: Blunting of the lateral left costophrenic sulcus, stable and consistent with scarring. Interstitial coarsening at the bases, similar to previous. Chronic cardiomegaly. Aortic and hilar contours are negative. Emphysematous changes likely. No acute osseous findings.  IMPRESSION: 1. Interstitial coarsening, favored bronchitic over congestive. 2. Chronic cardiomegaly. 3. Chronic left lower pleural scarring.   Electronically Signed   By: Monte Fantasia M.D.   On: 08/04/2014 05:42    Review of Systems  Constitutional: Positive for malaise/fatigue. Negative for fever and chills.  HENT: Negative for ear pain.   Eyes: Negative for blurred vision and double vision.  Respiratory: Positive for shortness of breath. Negative for cough and hemoptysis.   Cardiovascular: Positive for chest pain, palpitations, orthopnea and leg swelling. Negative for PND.  Gastrointestinal: Positive for diarrhea. Negative for nausea and vomiting.  Genitourinary: Negative for dysuria and hematuria.  Musculoskeletal: Negative for myalgias and neck pain.  Neurological: Negative for dizziness, tingling, speech change and headaches.  Endo/Heme/Allergies: Negative for polydipsia.   Psychiatric/Behavioral: Negative for depression, suicidal ideas and substance abuse.   Blood pressure 146/75, pulse 59, temperature 97.8 F (36.6 C), resp. rate 19, last menstrual period 10/10/1969, SpO2 93 %. Physical Exam  Nursing note and vitals reviewed. Constitutional: She is oriented to person, place, and time. She appears well-developed and well-nourished. No distress.  HENT:  Head: Normocephalic and atraumatic.  Nose: Nose normal.  Mouth/Throat: Oropharynx is clear and moist. No oropharyngeal exudate.  Eyes: Conjunctivae and EOM are normal. Pupils are equal, round, and reactive to light. No scleral icterus.  Neck: Normal range of motion. Neck supple. JVD present.  JVP midneck with slight HJR  Cardiovascular:  Irregularly irregular, systolic murmur  Respiratory: Effort normal and breath sounds normal. No respiratory distress. She has no wheezes. She has no rales.  GI: Soft. Bowel sounds are normal. She exhibits no distension. There is no tenderness. There is no rebound.  Musculoskeletal: Normal range of motion. She exhibits edema and tenderness.  Neurological: She is alert and oriented to person, place, and time. No cranial nerve deficit.  Skin: Skin is warm and dry. No rash noted. She is not diaphoretic. No erythema.  Psychiatric: She has a normal mood and affect. Her behavior is normal. Thought content normal.   Labs reviewed; bun/cr22/1.3, Trop negative EKG atrial fibrillation 120s Chest x-ray: interstitial coarsening vs. Mild edema  Assessment/Plan: 81 yo woman with atrial fibrillation - new onset with symptom of chest discomfort on admission.  Atrial fibrillation + RVR: differential is idiopathic, ischemia, heart failure, drugs, thyroid disease, infection among other etiologies. For now, evaluate for triggers, diltiazem gtt, transition to oral observe on telemetry, start anticoagulation with lovenox for potential DCCV in AM - would favor TEE/DCCV. May be related to heart  failure given increased JVP, mildly elevated BNP.  - telemetry, trend cardiac markers, oral diltiazem - echocardiogram in AM - hba1c, troponins, lipid panel - urinalysis, TSH - IV lasix 20 mg x1, reasess volume status - evaluate systolic murmur with echo  Zackariah Vanderpol 08/04/2014, 5:48 AM

## 2014-08-04 NOTE — Progress Notes (Signed)
Eliquis benefits check completed: [facility]. Talked to CSR Peterson Ao covered as a TIER 3 Medication. PRIOR AUTHORIZATION is REQUIRED. Prior Authorization phone line is [phone]. Patient's retail pharmacy co-payment will be 20% to 25% of total costs. Some preferred pharmacies for [facility], [place], [facility], [facility], etc

## 2014-08-04 NOTE — Progress Notes (Signed)
ANTICOAGULATION CONSULT NOTE - Follow Up Consult  Pharmacy Consult for lovenox>> apixiban Indication: atrial fibrillation  Allergies  Allergen Reactions  . Codeine Anaphylaxis  . Sulfa Antibiotics Rash    Patient Measurements: Height: 5' 5.5" (166.4 cm) Weight: 181 lb 3.2 oz (82.192 kg) IBW/kg (Calculated) : 58.15  Vital Signs: Temp: 98 F (36.7 C) (05/26 0557) Temp Source: Oral (05/26 0557) BP: 133/94 mmHg (05/26 0557) Pulse Rate: 107 (05/26 0557)  Labs:  Recent Labs  08/04/14 0354 08/04/14 0359 08/04/14 0630  HGB 12.0 13.3  --   HCT 37.0 39.0  --   PLT 197  --   --   CREATININE  --  1.30*  --   TROPONINI  --   --  0.03    Estimated Creatinine Clearance: 36.3 mL/min (by C-G formula based on Cr of 1.3).   Medications:  Scheduled:  . aspirin EC  325 mg Oral Daily  . atorvastatin  20 mg Oral Daily  . diltiazem  60 mg Oral 3 times per day  . gabapentin  300 mg Oral BID  . QUEtiapine  25 mg Oral TID  . traMADol  50 mg Oral QID    Assessment: 81 yo female with afib on lovenox and pharmacy has been consulted to dose apixiban. SCr= 1.3 (last SCr was 1.4 01/2014), CrCl ~ 35, Wt= 82kg. She meets criteria for apixiban 5mg  po bid. She is noted for cardioversion in about one month.  -last lovenox dose was 85mg  at about 8:30am today  Goal of Therapy:  Monitor platelets by anticoagulation protocol: Yes   Plan:  -Apixiban 5mg  po bid (will begin tonight) -Will provide patient education -Consider discontinuing aspirin or decrease to 81mg /day?  Thank you, Hildred Laser, Pharm D 08/04/2014 10:29 AM

## 2014-08-04 NOTE — Progress Notes (Signed)
CSW received consult that patient is from [place]- no CSW involvement for DC back to [facility].  CSW signing off.  Domenica Reamer, [place] Social Worker [phone]

## 2014-08-04 NOTE — ED Notes (Signed)
Pt c/o palpitations on arrival; rate noted to range 100-130; denies chest pain at this time. Pt reports a "fluttering feeling in my chest."

## 2014-08-04 NOTE — ED Notes (Signed)
Pt reports improvement with shortness of breath. Rate maintained from 70-80s. Pt resting at this time

## 2014-08-04 NOTE — Progress Notes (Signed)
*  PRELIMINARY RESULTS* Echocardiogram 2D Echocardiogram has been performed.  Tracy Rodriguez 08/04/2014, 10:44 AM

## 2014-08-04 NOTE — Progress Notes (Signed)
PROGRESS NOTE  Subjective:   81 yo admitted with rapid atrial fibrillation. Was stated on Lovenox. Will DC Lovenox and start Eliquis.  No complaints today  Getting echo    Objective:    Vital Signs:   Temp:  [97.8 F (36.6 C)-98 F (36.7 C)] 98 F (36.7 C) (05/26 0557) Pulse Rate:  [59-107] 107 (05/26 0557) Resp:  [10-19] 18 (05/26 0557) BP: (133-158)/(61-104) 133/94 mmHg (05/26 0557) SpO2:  [91 %-99 %] 99 % (05/26 0557) Weight:  [82.192 kg (181 lb 3.2 oz)-84.823 kg (187 lb)] 82.192 kg (181 lb 3.2 oz) (05/26 0557)  Last BM Date: 08/03/14   24-hour weight change: Weight change:   Weight trends: Filed Weights   08/04/14 0530 08/04/14 0557  Weight: 84.823 kg (187 lb) 82.192 kg (181 lb 3.2 oz)    Intake/Output:  05/25 0701 - 05/26 0700 In: -  Out: 500 [Urine:500] Total I/O In: 360 [P.O.:360] Out: 450 [Urine:450]   Physical Exam: BP 133/94 mmHg  Pulse 107  Temp(Src) 98 F (36.7 C) (Oral)  Resp 18  Ht 5' 5.5" (1.664 m)  Wt 82.192 kg (181 lb 3.2 oz)  BMI 29.68 kg/m2  SpO2 99%  LMP 10/10/1969  Wt Readings from Last 3 Encounters:  08/04/14 82.192 kg (181 lb 3.2 oz)  07/29/14 84.823 kg (187 lb)  07/07/14 83.915 kg (185 lb)    General: Vital signs reviewed and noted.   Head: Normocephalic, atraumatic.  Eyes: conjunctivae/corneas clear.  EOM's intact.   Throat: normal  Neck:  normal   Lungs:    clear   Heart:  irreg. Ireg.   Abdomen:  Soft, non-tender, non-distended    Extremities: 1+ edema, legs are tender    Neurologic: A&O X3, CN II - XII are grossly intact.   Psych: Normal     Labs: BMET:  Recent Labs  08/04/14 0359  NA 144  K 3.6  CL 107  GLUCOSE 119*  BUN 22*  CREATININE 1.30*    Liver function tests: No results for input(s): AST, ALT, ALKPHOS, BILITOT, PROT, ALBUMIN in the last 72 hours. No results for input(s): LIPASE, AMYLASE in the last 72 hours.  CBC:  Recent Labs  08/04/14 0354 08/04/14 0359  WBC 5.2  --     NEUTROABS 3.2  --   HGB 12.0 13.3  HCT 37.0 39.0  MCV 88.3  --   PLT 197  --     Cardiac Enzymes:  Recent Labs  08/04/14 0630  TROPONINI 0.03    Coagulation Studies: No results for input(s): LABPROT, INR in the last 72 hours.  Other: Invalid input(s): POCBNP No results for input(s): DDIMER in the last 72 hours. No results for input(s): HGBA1C in the last 72 hours. No results for input(s): CHOL, HDL, LDLCALC, TRIG, CHOLHDL in the last 72 hours.  Recent Labs  08/04/14 0630  TSH 2.774   No results for input(s): VITAMINB12, FOLATE, FERRITIN, TIBC, IRON, RETICCTPCT in the last 72 hours.   Other results:  Tele  ( personally reviewed )  -atrial fib with controlled ventricular response  Medications:    Infusions:    Scheduled Medications: . aspirin EC  325 mg Oral Daily  . atorvastatin  20 mg Oral Daily  . diltiazem  60 mg Oral 3 times per day  . gabapentin  300 mg Oral BID  . QUEtiapine  25 mg Oral TID  . traMADol  50 mg Oral QID    Assessment/ Plan:  Principal Problem:   Chest pain at rest Active Problems:   Chronic back pain   Bipolar disorder   Obesity   Hypertension   Atrial fibrillation with RVR   New onset atrial fibrillation  1. Newly diagnosed atrial fib. I do not agree with cardioversion this am. We do not have any idea how long she has been in atrial fib. In addition, I would favor several doses of Eliquis before cardioversion.   She is completely asymptomatic at this point and there is no urgent need to do a cardioversion Echo is being done now.  Have started a heart healthy diet.   If her LV function is well preserved, we can continue diltiazem ,  If the LV function is reduced, she should be changed to beta blocker / ACE I . My plan would be to DC her to home once she is on NOAC and the appropriate rate controlling agent. My NP Truitt Merle) can see her in a month and set her up for elective cardioversion at that point.   2. Essential  HTN:  Continue meds. May make some changes depending on LV function ( see above)   Disposition:  Length of Stay:   Ramond Dial., MD, [facility] 08/04/2014, 10:06 AM Office [phone] Pager [phone]

## 2014-08-05 ENCOUNTER — Other Ambulatory Visit: Payer: Self-pay | Admitting: Internal Medicine

## 2014-08-05 DIAGNOSIS — I4891 Unspecified atrial fibrillation: Secondary | ICD-10-CM

## 2014-08-05 DIAGNOSIS — R079 Chest pain, unspecified: Secondary | ICD-10-CM

## 2014-08-05 LAB — CBC
HCT: 35.4 % — ABNORMAL LOW (ref 36.0–46.0)
Hemoglobin: 11.2 g/dL — ABNORMAL LOW (ref 12.0–15.0)
MCH: 27.8 pg (ref 26.0–34.0)
MCHC: 31.6 g/dL (ref 30.0–36.0)
MCV: 87.8 fL (ref 78.0–100.0)
Platelets: 187 10*3/uL (ref 150–400)
RBC: 4.03 MIL/uL (ref 3.87–5.11)
RDW: 16.3 % — ABNORMAL HIGH (ref 11.5–15.5)
WBC: 4.8 10*3/uL (ref 4.0–10.5)

## 2014-08-05 MED ORDER — APIXABAN 5 MG PO TABS: 5.0000 mg | ORAL_TABLET | Freq: Two times a day (BID) | ORAL | Status: DC

## 2014-08-05 MED ORDER — DILTIAZEM HCL 60 MG PO TABS: 60.0000 mg | ORAL_TABLET | Freq: Three times a day (TID) | ORAL | Status: DC

## 2014-08-05 NOTE — Discharge Summary (Signed)
Physician Discharge Summary  Tracy Rodriguez MEQ:[id number] DOB: [birth year] DOA: 08/04/2014  PCP: [name], MD  Admit date: 08/04/2014 Discharge date: 08/05/2014  Time spent: 25 minutes  Recommendations for Outpatient Follow-up:  1. Follow-up with Dr. [name] for possible cardioversion.    Discharge Diagnoses:  Principal Problem:   Chest pain at rest Active Problems:   Chronic back pain   Bipolar disorder   Obesity   Hypertension   Atrial fibrillation with RVR   New onset atrial fibrillation   Discharge Condition: stabl  Diet recommendation: heart healthy  Filed Weights   08/04/14 0530 08/04/14 0557  Weight: 84.823 kg (187 lb) 82.192 kg (181 lb 3.2 oz)    History of present illness:  81 yo female h/o htn, bipolar disorder, chronic back pain awoke from sleep tonight with sscp that radiated down her left arm. No associated n/v. No palpitations. No sob. No swelling in her legs. Pt denies any recent illnesses. No cough or fevers. She called ems. Found to be in afib w rvr. Pain resolved spontaneously in the ED. She was given in afib w rvr rate was in 140s given cardizem bolus and now rate in 60s but still in afib. She is now cp free. No prior h/o chf, CAD. She ambulates with any assistance at her baseline. No frequent falls.  Hospital Course:  Afib with RVR: - Started on IV diltiazem and then transitioned, her heart rate was less than 90 - She was started on Eluquis we she will continue as an outpatient. Cardiology was consulted who recommended Eliquis before cardioversion.  - She is completely asymptomatic at this point and there is no urgent need to do a cardioversion Echo does show no acute abnormalities. Have started a heart healthy diet. Home on oral anticoagulation follow-up with cardiology.  Essential hypertension: No change from atrial medication. She'll continue Eluquis. Procedures:  Echo: EF  50%  Consultations:  cardiology  Discharge Exam: Filed Vitals:   08/05/14 0454  BP: 137/67  Pulse: 54  Temp: 97.6 F (36.4 C)  Resp: 18    General: A&O x3 Cardiovascular: RRR Respiratory: good air movement CTA B/L  Discharge Instructions   Discharge Instructions    Diet - low sodium heart healthy    Complete by:  As directed      Increase activity slowly    Complete by:  As directed           Current Discharge Medication List    START taking these medications   Details  apixaban (ELIQUIS) 5 MG TABS tablet Take 1 tablet (5 mg total) by mouth 2 (two) times daily. Qty: 60 tablet, Refills: 2    diltiazem (CARDIZEM) 60 MG tablet Take 1 tablet (60 mg total) by mouth every 8 (eight) hours. Qty: 90 tablet, Refills: 2      CONTINUE these medications which have NOT CHANGED   Details  atorvastatin (LIPITOR) 20 MG tablet Take 1 tablet (20 mg total) by mouth daily. Qty: 90 tablet, Refills: 1    escitalopram (LEXAPRO) 10 MG tablet TAKE 1 TABLET BY MOUTH ONCE A DAY Qty: 90 tablet, Refills: 3    gabapentin (NEURONTIN) 300 MG capsule TAKE ONE CAPSULE BY MOUTH TWICE DAILY Qty: 180 capsule, Refills: 3    nabumetone (RELAFEN) 750 MG tablet TAKE 1 TABLET BY MOUTH TWICE DAILY Qty: 180 tablet, Refills: 1    pseudoephedrine (SUDAFED) 30 MG tablet Take 30 mg by mouth as needed for congestion.    QUEtiapine (SEROQUEL) 25 MG  tablet Take 1 tablet (25 mg total) by mouth 3 (three) times daily. Qty: 270 tablet, Refills: 1    traMADol (ULTRAM) 50 MG tablet Take 1 tablet (50 mg total) by mouth 4 (four) times daily. Qty: 120 tablet, Refills: 5    traZODone (DESYREL) 150 MG tablet TAKE 1 TABLET BY MOUTH EVERY NIGHT AT BEDTIME Qty: 90 tablet, Refills: 1       Allergies  Allergen Reactions  . Codeine Anaphylaxis  . Sulfa Antibiotics Rash      The results of significant diagnostics from this hospitalization (including imaging, microbiology, ancillary and laboratory) are listed  below for reference.    Significant Diagnostic Studies: Dg Chest Port 1 View  08/04/2014   CLINICAL DATA:  Palpitations and chest pain  EXAM: PORTABLE CHEST - 1 VIEW  COMPARISON:  11/20/2012  FINDINGS: Blunting of the lateral left costophrenic sulcus, stable and consistent with scarring. Interstitial coarsening at the bases, similar to previous. Chronic cardiomegaly. Aortic and hilar contours are negative. Emphysematous changes likely. No acute osseous findings.  IMPRESSION: 1. Interstitial coarsening, favored bronchitic over congestive. 2. Chronic cardiomegaly. 3. Chronic left lower pleural scarring.   Electronically Signed   By: Monte Fantasia M.D.   On: 08/04/2014 05:42    Microbiology: No results found for this or any previous visit (from the past 240 hour(s)).   Labs: Basic Metabolic Panel:  Recent Labs Lab 08/04/14 0359  NA 144  K 3.6  CL 107  GLUCOSE 119*  BUN 22*  CREATININE 1.30*   Liver Function Tests: No results for input(s): AST, ALT, ALKPHOS, BILITOT, PROT, ALBUMIN in the last 168 hours. No results for input(s): LIPASE, AMYLASE in the last 168 hours. No results for input(s): AMMONIA in the last 168 hours. CBC:  Recent Labs Lab 08/04/14 0354 08/04/14 0359 08/05/14 0451  WBC 5.2  --  4.8  NEUTROABS 3.2  --   --   HGB 12.0 13.3 11.2*  HCT 37.0 39.0 35.4*  MCV 88.3  --  87.8  PLT 197  --  187   Cardiac Enzymes:  Recent Labs Lab 08/04/14 0630 08/04/14 1325 08/04/14 1929  TROPONINI 0.03 0.03 0.04*   BNP: BNP (last 3 results)  Recent Labs  08/04/14 0354  BNP 262.1*    ProBNP (last 3 results) No results for input(s): PROBNP in the last 8760 hours.  CBG: No results for input(s): GLUCAP in the last 168 hours.     Signed:  Charlynne Cousins  [facility] 08/05/2014, 9:46 AM

## 2014-08-05 NOTE — Progress Notes (Signed)
Held Cardizem H.R. 54-58 See previous note for holding Cardizem for H.R. Less than 60. Charge R.N. Aware.

## 2014-08-09 ENCOUNTER — Other Ambulatory Visit: Payer: Self-pay | Admitting: Internal Medicine

## 2014-08-29 ENCOUNTER — Encounter: Payer: Medicare Other | Admitting: Podiatry

## 2014-08-29 NOTE — Patient Instructions (Signed)

## 2014-08-30 ENCOUNTER — Encounter: Payer: Self-pay | Admitting: Cardiovascular Disease

## 2014-08-30 ENCOUNTER — Ambulatory Visit (INDEPENDENT_AMBULATORY_CARE_PROVIDER_SITE_OTHER): Payer: Medicare Other | Admitting: Cardiovascular Disease

## 2014-08-30 VITALS — BP 110/80 | HR 66 | Ht 66.5 in | Wt 190.2 lb

## 2014-08-30 DIAGNOSIS — I4891 Unspecified atrial fibrillation: Secondary | ICD-10-CM

## 2014-08-30 DIAGNOSIS — I480 Paroxysmal atrial fibrillation: Secondary | ICD-10-CM

## 2014-08-30 NOTE — Patient Instructions (Signed)
Medication Instructions:  Your physician recommends that you continue on your current medications as directed. Please refer to the Current Medication list given to you today.   Labwork: None Ordered   Testing/Procedures: None Ordered   Follow-Up: Your physician wants you to follow-up in: 6 months with Dr. Nahser.  You will receive a reminder letter in the mail two months in advance. If you don't receive a letter, please call our office to schedule the follow-up appointment.     

## 2014-08-30 NOTE — Progress Notes (Signed)
Cardiology Office Note   Date:  08/30/2014   ID:  Tracy Rodriguez, DOB [birth year], MRN [id number]  PCP:  [name], MD  Cardiologist:   Thayer Headings, MD   Chief Complaint  Patient presents with  . Atrial Fibrillation   Problem List 1. Severe chest pain  2. Atrial fibrillation- she spontaneously converted to sinus rhythm   History of Present Illness: Tracy Rodriguez is a 81 y.o. female who presents for follow-up of a hospitalization recently for chest pain and atrial fibrillation. Her troponin levels were negative. She had an echocardiogram which revealed:  - Left ventricle: The cavity size was normal. Wall thickness was increased in a pattern of mild LVH. Systolic function was normal. The estimated ejection fraction was in the range of 55% to 60%. Wall motion was normal; there were no regional wall motion abnormalities. - Mitral valve: Calcified annulus. - Right atrium: The atrium was mildly dilated.  She is back doing her normal activities.  She is on eliquis.  No bleeding complications   Past Medical History  Diagnosis Date  . Hypertension   . Bipolar 1 disorder   . Cancer of right breast   . Hyperlipemia   . Arthritis     "joints" (08/04/2014)  . Chronic lower back pain     Past Surgical History  Procedure Laterality Date  . Cataract extraction w/ intraocular lens  implant, bilateral Bilateral   . Tonsillectomy    . Excisional hemorrhoidectomy    . Breast biopsy Right   . Mastectomy Right     cancer  . Breast reconstruction Right   . Placement of breast implants Bilateral     "had to take tissure out of left"  . Lumbar disc surgery  1972; 1973; 1985    "ruptured discs each time"  . Back surgery    . Vaginal hysterectomy       Current Outpatient Prescriptions  Medication Sig Dispense Refill  . apixaban (ELIQUIS) 5 MG TABS tablet Take 1 tablet (5 mg total) by mouth 2 (two) times daily. 60 tablet 2  . atorvastatin (LIPITOR) 20 MG  tablet TAKE 1 TABLET BY MOUTH DAILY 90 tablet 1  . diltiazem (CARDIZEM) 60 MG tablet Take 1 tablet (60 mg total) by mouth every 8 (eight) hours. 90 tablet 2  . escitalopram (LEXAPRO) 10 MG tablet TAKE 1 TABLET BY MOUTH ONCE A DAY 90 tablet 3  . gabapentin (NEURONTIN) 300 MG capsule TAKE ONE CAPSULE BY MOUTH TWICE DAILY 180 capsule 3  . nabumetone (RELAFEN) 750 MG tablet TAKE 1 TABLET BY MOUTH TWICE DAILY 180 tablet 1  . pseudoephedrine (SUDAFED) 30 MG tablet Take 30 mg by mouth as needed for congestion.    . QUEtiapine (SEROQUEL) 25 MG tablet Take 1 tablet (25 mg total) by mouth 3 (three) times daily. 270 tablet 1  . traMADol (ULTRAM) 50 MG tablet TAKE 1 TABLET BY MOUTH FOUR TIMES DAILY 120 tablet 2  . traZODone (DESYREL) 150 MG tablet TAKE 1 TABLET BY MOUTH EVERY NIGHT AT BEDTIME 90 tablet 1   No current facility-administered medications for this visit.    Allergies:   Codeine and Sulfa antibiotics    Social History:  The patient  reports that she has never smoked. She has never used smokeless tobacco. She reports that she does not drink alcohol or use illicit drugs.   Family History:  The patient's family history includes Alzheimer's disease in her sister; Arthritis in her maternal grandmother; Heart disease in  her father; Leukemia in her brother; Stroke in her mother.    ROS:  Please see the history of present illness.    Review of Systems: Constitutional:  denies fever, chills, diaphoresis, appetite change and fatigue.  HEENT: denies photophobia, eye pain, redness, hearing loss, ear pain, congestion, sore throat, rhinorrhea, sneezing, neck pain, neck stiffness and tinnitus.  Respiratory: denies SOB, DOE, cough, chest tightness, and wheezing.  Cardiovascular: denies chest pain, palpitations and leg swelling.  Gastrointestinal: denies nausea, vomiting, abdominal pain, diarrhea, constipation, blood in stool.  Genitourinary: denies dysuria, urgency, frequency, hematuria, flank pain and  difficulty urinating.  Musculoskeletal: denies  myalgias, back pain, joint swelling, arthralgias and gait problem.   Skin: denies pallor, rash and wound.  Neurological: denies dizziness, seizures, syncope, weakness, light-headedness, numbness and headaches.   Hematological: denies adenopathy, easy bruising, personal or family bleeding history.  Psychiatric/ Behavioral: denies suicidal ideation, mood changes, confusion, nervousness, sleep disturbance and agitation.       All other systems are reviewed and negative.    PHYSICAL EXAM: VS:  BP 110/80 mmHg  Pulse 66  Ht 5' 6.5" (1.689 m)  Wt 86.274 kg (190 lb 3.2 oz)  BMI 30.24 kg/m2  LMP 10/10/1969 , BMI Body mass index is 30.24 kg/(m^2). GEN: Well nourished, well developed, in no acute distress HEENT: normal Neck: no JVD, carotid bruits, or masses Cardiac: RRR; no murmurs, rubs, or gallops,no edema  Respiratory:  clear to auscultation bilaterally, normal work of breathing GI: soft, nontender, nondistended, + BS MS: no deformity or atrophy Skin: warm and dry, no rash Neuro:  Strength and sensation are intact Psych: normal   EKG:  EKG is ordered today. The ekg ordered today demonstrates :  NSR at 66, NS ST abn.     Recent Labs: 01/20/2014: ALT 13 08/04/2014: B Natriuretic Peptide 262.1*; BUN 22*; Creatinine, Ser 1.30*; Potassium 3.6; Sodium 144; TSH 2.774 08/05/2014: Hemoglobin 11.2*; Platelets 187    Lipid Panel    Component Value Date/Time   CHOL 299* 01/20/2014 1123   TRIG 92.0 01/20/2014 1123   HDL 51.70 01/20/2014 1123   CHOLHDL 6 01/20/2014 1123   VLDL 18.4 01/20/2014 1123   LDLCALC 229* 01/20/2014 1123      Wt Readings from Last 3 Encounters:  08/30/14 86.274 kg (190 lb 3.2 oz)  08/04/14 82.192 kg (181 lb 3.2 oz)  07/29/14 84.823 kg (187 lb)      Other studies Reviewed: Additional studies/ records that were reviewed today include: . Review of the above records demonstrates:    ASSESSMENT AND  PLAN:  1. Severe chest pain -  - ruled out for MI , no further work up indicated at this time.   2. Atrial fibrillation- she spontaneously converted to sinus rhythm. Continue Eliquis.  Will try to get samples.    Current medicines are reviewed at length with the patient today.  The patient does not have concerns regarding medicines.  The following changes have been made:  no change  Labs/ tests ordered today include:  No orders of the defined types were placed in this encounter.     Disposition:   FU with me in 6 months       Ala Kratz, Wonda Cheng, MD  08/30/2014 8:47 AM    [facility] [place], [place], [place]  [address] Phone: [phone]; Fax: [phone]   [facility]  [address] [place], [place]  [address] [phone]  Fax [phone]

## 2014-10-31 ENCOUNTER — Other Ambulatory Visit: Payer: Self-pay | Admitting: Internal Medicine

## 2014-11-08 ENCOUNTER — Telehealth: Payer: Self-pay | Admitting: Internal Medicine

## 2014-11-08 NOTE — Telephone Encounter (Signed)
Spoke to [employer] with [employer], verbal order given for Physical therapy for muscle weakness and ankle therapy, Okay per Dr.K. Told her I believe I saw the order and he signed it already. If you do not receive it in a few days please fax again. [name] verbalized understanding.

## 2014-11-08 NOTE — Telephone Encounter (Signed)
[name] w/ [facility] needs verbal for Physical therapy for muscle weakness and ankle therapy Sent fax last week, has not received anything back.   pls call w/ verbal   If you have signed copy, pls fax to:  [phone]

## 2014-11-09 DIAGNOSIS — M6281 Muscle weakness (generalized): Secondary | ICD-10-CM | POA: Diagnosis not present

## 2014-11-09 DIAGNOSIS — M25371 Other instability, right ankle: Secondary | ICD-10-CM | POA: Diagnosis not present

## 2014-11-09 NOTE — Progress Notes (Signed)
This encounter was created in error - please disregard.

## 2014-11-10 DIAGNOSIS — M25371 Other instability, right ankle: Secondary | ICD-10-CM | POA: Diagnosis not present

## 2014-11-10 DIAGNOSIS — M6281 Muscle weakness (generalized): Secondary | ICD-10-CM | POA: Diagnosis not present

## 2014-11-15 ENCOUNTER — Other Ambulatory Visit: Payer: Self-pay | Admitting: Internal Medicine

## 2014-11-15 DIAGNOSIS — M6281 Muscle weakness (generalized): Secondary | ICD-10-CM | POA: Diagnosis not present

## 2014-11-15 DIAGNOSIS — M25371 Other instability, right ankle: Secondary | ICD-10-CM | POA: Diagnosis not present

## 2014-11-16 DIAGNOSIS — M6281 Muscle weakness (generalized): Secondary | ICD-10-CM | POA: Diagnosis not present

## 2014-11-16 DIAGNOSIS — M25371 Other instability, right ankle: Secondary | ICD-10-CM | POA: Diagnosis not present

## 2014-11-17 DIAGNOSIS — M25371 Other instability, right ankle: Secondary | ICD-10-CM | POA: Diagnosis not present

## 2014-11-17 DIAGNOSIS — M6281 Muscle weakness (generalized): Secondary | ICD-10-CM | POA: Diagnosis not present

## 2014-11-21 DIAGNOSIS — M25371 Other instability, right ankle: Secondary | ICD-10-CM | POA: Diagnosis not present

## 2014-11-21 DIAGNOSIS — M6281 Muscle weakness (generalized): Secondary | ICD-10-CM | POA: Diagnosis not present

## 2014-11-22 ENCOUNTER — Telehealth: Payer: Self-pay | Admitting: Internal Medicine

## 2014-11-22 DIAGNOSIS — M6281 Muscle weakness (generalized): Secondary | ICD-10-CM | POA: Diagnosis not present

## 2014-11-22 DIAGNOSIS — M25371 Other instability, right ankle: Secondary | ICD-10-CM | POA: Diagnosis not present

## 2014-11-22 NOTE — Telephone Encounter (Signed)
Please see message and advise 

## 2014-11-22 NOTE — Telephone Encounter (Signed)
[name]  Physical therapist from [facility] is calling report pt has low extremities swelling and pain. No warmth and 2 to 3 pitting edema. Pt has fine red rash on her shin. Pt has discomfort when she walks. Pt weight 191.2. Pt does not know her basic line of weight. Please advise

## 2014-11-23 NOTE — Telephone Encounter (Signed)
Pt has been sch. Pt unable to come in until 11-25-14

## 2014-11-23 NOTE — Telephone Encounter (Signed)
Please call pt and schedule appointment to see Dr.K per him, this week.

## 2014-11-23 NOTE — Telephone Encounter (Signed)
Please schedule return office visit 

## 2014-11-25 ENCOUNTER — Ambulatory Visit (INDEPENDENT_AMBULATORY_CARE_PROVIDER_SITE_OTHER): Payer: Medicare Other | Admitting: Internal Medicine

## 2014-11-25 ENCOUNTER — Encounter: Payer: Self-pay | Admitting: Internal Medicine

## 2014-11-25 VITALS — BP 150/80 | HR 67 | Temp 98.1°F | Resp 20 | Ht 66.5 in | Wt 190.0 lb

## 2014-11-25 DIAGNOSIS — I4891 Unspecified atrial fibrillation: Secondary | ICD-10-CM

## 2014-11-25 DIAGNOSIS — M6281 Muscle weakness (generalized): Secondary | ICD-10-CM | POA: Diagnosis not present

## 2014-11-25 DIAGNOSIS — Z23 Encounter for immunization: Secondary | ICD-10-CM

## 2014-11-25 DIAGNOSIS — I10 Essential (primary) hypertension: Secondary | ICD-10-CM

## 2014-11-25 DIAGNOSIS — M25371 Other instability, right ankle: Secondary | ICD-10-CM | POA: Diagnosis not present

## 2014-11-25 NOTE — Progress Notes (Signed)
   Subjective:    Patient ID: Tracy Rodriguez, female    DOB: October 15, 1931, 81 y.o.   MRN: [id number]  HPI  Wt Readings from Last 3 Encounters:  11/25/14 190 lb (86.183 kg)  08/30/14 190 lb 3.2 oz (86.274 kg)  08/04/14 181 lb 3.2 oz (82.192 kg)    Review of Systems     Objective:   Physical Exam        Assessment & Plan:

## 2014-11-25 NOTE — Progress Notes (Signed)
Pre visit review using our clinic review tool, if applicable. No additional management support is needed unless otherwise documented below in the visit note. 

## 2014-11-25 NOTE — Patient Instructions (Addendum)
Limit your sodium (Salt) intake  Peripheral Edema You have swelling in your legs (peripheral edema). This swelling is due to excess accumulation of salt and water in your body. Edema may be a sign of heart, kidney or liver disease, or a side effect of a medication. It may also be due to problems in the leg veins. Elevating your legs and using special support stockings may be very helpful, if the cause of the swelling is due to poor venous circulation. Avoid long periods of standing, whatever the cause. Treatment of edema depends on identifying the cause. Chips, pretzels, pickles and other salty foods should be avoided. Restricting salt in your diet is almost always needed. Water pills (diuretics) are often used to remove the excess salt and water from your body via urine. These medicines prevent the kidney from reabsorbing sodium. This increases urine flow. Diuretic treatment may also result in lowering of potassium levels in your body. Potassium supplements may be needed if you have to use diuretics daily. Daily weights can help you keep track of your progress in clearing your edema. You should call your caregiver for follow up care as recommended. SEEK IMMEDIATE MEDICAL CARE IF:   You have increased swelling, pain, redness, or heat in your legs.  You develop shortness of breath, especially when lying down.  You develop chest or abdominal pain, weakness, or fainting.  You have a fever. Document Released: 04/04/2004 Document Revised: 05/20/2011 Document Reviewed: 03/15/2009 [facility] Patient Information 2015 [place], [place]. This information is not intended to replace advice given to you by your health care provider. Make sure you discuss any questions you have with your health care provider.  Furosemide use twice daily for the next 3 days and then resume once daily in the morning

## 2014-11-25 NOTE — Progress Notes (Signed)
Subjective:    Patient ID: Tracy Rodriguez, female    DOB: 1931-10-11, 81 y.o.   MRN: [id number]  HPI  81 year old patient who has a history of paroxysmal atrial fibrillation.  She is on Cardizem, but no longer is on anticoagulation.  She has been seen by cardiology.  She has been receiving physical therapy at [facility], and her therapist was concerned about pedal edema.  The patient is unclear whether the swelling has really intensified.  There has been no weight gain.  She denies any shortness of breath.  Past Medical History  Diagnosis Date  . Hypertension   . Bipolar 1 disorder   . Cancer of right breast   . Hyperlipemia   . Arthritis     "joints" (08/04/2014)  . Chronic lower back pain     Social History   Social History  . Marital Status: Widowed    Spouse Name: N/A  . Number of Children: N/A  . Years of Education: N/A   Occupational History  . Not on file.   Social History Main Topics  . Smoking status: Never Smoker   . Smokeless tobacco: Never Used  . Alcohol Use: No  . Drug Use: No  . Sexual Activity: No   Other Topics Concern  . Not on file   Social History Narrative    Past Surgical History  Procedure Laterality Date  . Cataract extraction w/ intraocular lens  implant, bilateral Bilateral   . Tonsillectomy    . Excisional hemorrhoidectomy    . Breast biopsy Right   . Mastectomy Right     cancer  . Breast reconstruction Right   . Placement of breast implants Bilateral     "had to take tissure out of left"  . Lumbar disc surgery  1972; 1973; 1985    "ruptured discs each time"  . Back surgery    . Vaginal hysterectomy      Family History  Problem Relation Age of Onset  . Stroke Mother   . Heart disease Father   . Alzheimer's disease Sister   . Leukemia Brother   . Arthritis Maternal Grandmother     Allergies  Allergen Reactions  . Codeine Anaphylaxis  . Sulfa Antibiotics Rash    Current Outpatient Prescriptions on File Prior to Visit    Medication Sig Dispense Refill  . atorvastatin (LIPITOR) 20 MG tablet TAKE 1 TABLET BY MOUTH DAILY 90 tablet 1  . diltiazem (CARDIZEM) 60 MG tablet Take 1 tablet (60 mg total) by mouth every 8 (eight) hours. 90 tablet 2  . escitalopram (LEXAPRO) 10 MG tablet TAKE 1 TABLET BY MOUTH ONCE A DAY 90 tablet 3  . furosemide (LASIX) 40 MG tablet TAKE 1 TABLET BY MOUTH EVERY DAY 90 tablet 0  . gabapentin (NEURONTIN) 300 MG capsule TAKE ONE CAPSULE BY MOUTH TWICE DAILY 180 capsule 3  . nabumetone (RELAFEN) 750 MG tablet TAKE 1 TABLET BY MOUTH TWICE DAILY 180 tablet 0  . pseudoephedrine (SUDAFED) 30 MG tablet Take 30 mg by mouth as needed for congestion.    . QUEtiapine (SEROQUEL) 25 MG tablet Take 1 tablet (25 mg total) by mouth 3 (three) times daily. 270 tablet 1  . traMADol (ULTRAM) 50 MG tablet TAKE 1 TABLET BY MOUTH FOUR TIMES DAILY 120 tablet 2  . traZODone (DESYREL) 150 MG tablet TAKE 1 TABLET BY MOUTH EVERY NIGHT AT BEDTIME 90 tablet 1   No current facility-administered medications on file prior to visit.    BP  150/80 mmHg  Pulse 67  Temp(Src) 98.1 F (36.7 C) (Oral)  Resp 20  Ht 5' 6.5" (1.689 m)  Wt 190 lb (86.183 kg)  BMI 30.21 kg/m2  SpO2 97%  LMP 10/10/1969     Review of Systems  Constitutional: Negative.   HENT: Negative for congestion, dental problem, hearing loss, rhinorrhea, sinus pressure, sore throat and tinnitus.   Eyes: Negative for pain, discharge and visual disturbance.  Respiratory: Negative for cough and shortness of breath.   Cardiovascular: Positive for leg swelling. Negative for chest pain and palpitations.  Gastrointestinal: Negative for nausea, vomiting, abdominal pain, diarrhea, constipation, blood in stool and abdominal distention.  Genitourinary: Negative for dysuria, urgency, frequency, hematuria, flank pain, vaginal bleeding, vaginal discharge, difficulty urinating, vaginal pain and pelvic pain.  Musculoskeletal: Negative for joint swelling, arthralgias  and gait problem.  Skin: Negative for rash.  Neurological: Negative for dizziness, syncope, speech difficulty, weakness, numbness and headaches.  Hematological: Negative for adenopathy.  Psychiatric/Behavioral: Negative for behavioral problems, dysphoric mood and agitation. The patient is not nervous/anxious.        Objective:   Physical Exam  Constitutional: She is oriented to person, place, and time. She appears well-developed and well-nourished.  Blood pressure 134/78  HENT:  Head: Normocephalic.  Right Ear: External ear normal.  Left Ear: External ear normal.  Mouth/Throat: Oropharynx is clear and moist.  Eyes: Conjunctivae and EOM are normal. Pupils are equal, round, and reactive to light.  Neck: Normal range of motion. Neck supple. No thyromegaly present.  Cardiovascular: Normal rate, regular rhythm, normal heart sounds and intact distal pulses.   Pulmonary/Chest: Effort normal and breath sounds normal. No respiratory distress. She has no wheezes. She has no rales.  Abdominal: Soft. Bowel sounds are normal. She exhibits no mass. There is no tenderness.  Musculoskeletal: Normal range of motion.  Plus 1 edema  Lymphadenopathy:    She has no cervical adenopathy.  Neurological: She is alert and oriented to person, place, and time.  Skin: Skin is warm and dry. No rash noted.  Psychiatric: She has a normal mood and affect. Her behavior is normal.          Assessment & Plan:   Paroxysmal atrial fibrillation Hypertension, stable Mild pedal edema.  Low-salt diet.  Encouraged.  Will increase furosemide to a twice a day regimen for 3 days only, then resume once daily  Recheck 3 months

## 2014-11-28 DIAGNOSIS — M25371 Other instability, right ankle: Secondary | ICD-10-CM | POA: Diagnosis not present

## 2014-11-28 DIAGNOSIS — M6281 Muscle weakness (generalized): Secondary | ICD-10-CM | POA: Diagnosis not present

## 2014-11-30 DIAGNOSIS — M6281 Muscle weakness (generalized): Secondary | ICD-10-CM | POA: Diagnosis not present

## 2014-11-30 DIAGNOSIS — M25371 Other instability, right ankle: Secondary | ICD-10-CM | POA: Diagnosis not present

## 2014-12-01 DIAGNOSIS — M6281 Muscle weakness (generalized): Secondary | ICD-10-CM | POA: Diagnosis not present

## 2014-12-01 DIAGNOSIS — M25371 Other instability, right ankle: Secondary | ICD-10-CM | POA: Diagnosis not present

## 2014-12-05 ENCOUNTER — Telehealth: Payer: Self-pay | Admitting: Internal Medicine

## 2014-12-05 DIAGNOSIS — M6281 Muscle weakness (generalized): Secondary | ICD-10-CM | POA: Diagnosis not present

## 2014-12-05 DIAGNOSIS — M25371 Other instability, right ankle: Secondary | ICD-10-CM | POA: Diagnosis not present

## 2014-12-05 NOTE — Telephone Encounter (Signed)
Spoke to [name], told her Dr.K said support stocking are okay, return office visit if any worsening. [name] verbalized understanding and will let pt know. Told her okay, thanks.

## 2014-12-05 NOTE — Telephone Encounter (Signed)
Please see message and advise 

## 2014-12-05 NOTE — Telephone Encounter (Signed)
[name] w/ [facility] called to report for pt: Lower extremity edema in both legs, tender discomfort when touching. Starts above knee and all the way down Pt has wt gain up to 194.2 lbs   [name] asked could pt  benefit from support stocking or teds? Vitals: bp150/80    p 64    O2 95%  Pt also states some difficulty while laying down at night.  [name] is going to leave in a minute. pls call pt w/ fu advice

## 2014-12-05 NOTE — Telephone Encounter (Signed)
Support stockings okay Return office visit.  If any worsening

## 2014-12-06 DIAGNOSIS — M6281 Muscle weakness (generalized): Secondary | ICD-10-CM | POA: Diagnosis not present

## 2014-12-06 DIAGNOSIS — M25371 Other instability, right ankle: Secondary | ICD-10-CM | POA: Diagnosis not present

## 2014-12-07 DIAGNOSIS — M25371 Other instability, right ankle: Secondary | ICD-10-CM | POA: Diagnosis not present

## 2014-12-07 DIAGNOSIS — M6281 Muscle weakness (generalized): Secondary | ICD-10-CM | POA: Diagnosis not present

## 2014-12-08 ENCOUNTER — Telehealth: Payer: Self-pay | Admitting: Internal Medicine

## 2014-12-08 DIAGNOSIS — M25371 Other instability, right ankle: Secondary | ICD-10-CM | POA: Diagnosis not present

## 2014-12-08 DIAGNOSIS — M6281 Muscle weakness (generalized): Secondary | ICD-10-CM | POA: Diagnosis not present

## 2014-12-08 MED ORDER — DILTIAZEM HCL ER 180 MG PO CP24: 180.0000 mg | ORAL_CAPSULE | Freq: Every day | ORAL | Status: DC

## 2014-12-08 NOTE — Telephone Encounter (Signed)
[name] from [facility] would like to know if patient should take medication mid day or wait til next dose

## 2014-12-08 NOTE — Telephone Encounter (Signed)
Spoke to pt, asked her if she took her Cardizem today at all? Pt said no and forgot to take it last night and has only been taking it at night. Told pt she is suppose to be taking it every 8 hours which is 3 times a day. Pt said she did not realize that and has only been taking one at night. Told pt let me clarify directions with Dr.K and I will call her back. Pt verbalized understanding.

## 2014-12-08 NOTE — Telephone Encounter (Signed)
Change Cardizem to diltiazem extended release 180 mg 1 daily #90

## 2014-12-08 NOTE — Telephone Encounter (Signed)
Dr. Raliegh Ip, please clarify how many times a day pt is to take Cardizem 60 mg?

## 2014-12-08 NOTE — Telephone Encounter (Signed)
Spoke to pt told her Dr.K is changing her Rx to Diltiazem ER 180 mg one capsule daily. Do not take other medication, pickup medication tonight and start in the morning and monitor blood pressure. Pt verbalized understanding. Rx sent to pharmacy.

## 2014-12-08 NOTE — Telephone Encounter (Signed)
[name] physical therapist is calling to report patient  bp 170/100 then after resting for 5 min 160/84. Pt forget to take a dose of her bp med last night. After the patient was walking in therapy her bp was 170/98

## 2014-12-11 ENCOUNTER — Other Ambulatory Visit: Payer: Self-pay | Admitting: Internal Medicine

## 2014-12-12 DIAGNOSIS — M25371 Other instability, right ankle: Secondary | ICD-10-CM | POA: Diagnosis not present

## 2014-12-12 DIAGNOSIS — M6281 Muscle weakness (generalized): Secondary | ICD-10-CM | POA: Diagnosis not present

## 2014-12-14 ENCOUNTER — Telehealth: Payer: Self-pay | Admitting: Internal Medicine

## 2014-12-14 DIAGNOSIS — M25371 Other instability, right ankle: Secondary | ICD-10-CM | POA: Diagnosis not present

## 2014-12-14 DIAGNOSIS — M6281 Muscle weakness (generalized): Secondary | ICD-10-CM | POA: Diagnosis not present

## 2014-12-14 NOTE — Telephone Encounter (Signed)
Tracy Rodriguez from [facility] call to say that pt still has leg swelling and is inquiring if pt will benefit from skill nursing service thru home health so that they can manage her home health more closely .   [phone]

## 2014-12-15 DIAGNOSIS — M25371 Other instability, right ankle: Secondary | ICD-10-CM | POA: Diagnosis not present

## 2014-12-15 DIAGNOSIS — M6281 Muscle weakness (generalized): Secondary | ICD-10-CM | POA: Diagnosis not present

## 2014-12-15 NOTE — Telephone Encounter (Signed)
Left detailed message on personal voicemail of Beth's pt's daughter. Need to call office and schedule an appt for your mom. Dr.K would like to see her due to swelling in legs not any better. He does have openings tomorrow or Monday. Tried to contact your mom but her voicemail box was full. Please call office and schedule appt at [phone].

## 2014-12-15 NOTE — Telephone Encounter (Signed)
Tried to contact pt unable to leave message voicemail box full.  Called and spoke to [place], told her tried to contact pt and she needs to be seen due to swelling worse. Asked her if pt get ted stockings? [name], said yes but still having swelling. [name] said to try her daughter Eustaquio Maize. Told her okay I will and we will get pt in to be seen. [name] verbalized understanding.

## 2014-12-16 NOTE — Telephone Encounter (Signed)
Left a vm for pt's daughterAndrey Farmer) on [place], also attempted to call pt and vm is full.

## 2014-12-19 ENCOUNTER — Ambulatory Visit (INDEPENDENT_AMBULATORY_CARE_PROVIDER_SITE_OTHER): Payer: Medicare Other | Admitting: Internal Medicine

## 2014-12-19 ENCOUNTER — Encounter: Payer: Self-pay | Admitting: Internal Medicine

## 2014-12-19 VITALS — BP 140/80 | HR 65 | Temp 97.9°F | Resp 18 | Ht 66.5 in | Wt 193.0 lb

## 2014-12-19 DIAGNOSIS — R600 Localized edema: Secondary | ICD-10-CM

## 2014-12-19 DIAGNOSIS — Z7901 Long term (current) use of anticoagulants: Secondary | ICD-10-CM

## 2014-12-19 DIAGNOSIS — I4891 Unspecified atrial fibrillation: Secondary | ICD-10-CM | POA: Diagnosis not present

## 2014-12-19 DIAGNOSIS — I10 Essential (primary) hypertension: Secondary | ICD-10-CM | POA: Diagnosis not present

## 2014-12-19 DIAGNOSIS — M6281 Muscle weakness (generalized): Secondary | ICD-10-CM | POA: Diagnosis not present

## 2014-12-19 DIAGNOSIS — M25371 Other instability, right ankle: Secondary | ICD-10-CM | POA: Diagnosis not present

## 2014-12-19 MED ORDER — WARFARIN SODIUM 5 MG PO TABS: 5.0000 mg | ORAL_TABLET | Freq: Every day | ORAL | Status: DC

## 2014-12-19 MED ORDER — DILTIAZEM HCL ER 120 MG PO CP24: 120.0000 mg | ORAL_CAPSULE | Freq: Every day | ORAL | Status: DC

## 2014-12-19 NOTE — Progress Notes (Signed)
Subjective:    Patient ID: Tracy Rodriguez, female    DOB: 1932/01/18, 81 y.o.   MRN: [id number]  HPI  Wt Readings from Last 3 Encounters:  12/19/14 193 lb (87.544 kg)  11/25/14 190 lb (86.183 kg)  08/30/14 190 lb 3.2 oz (86.65 kg)   81 year old patient who has a history of paroxysmal atrial fibrillation.  2-D echocardiogram in the past has revealed normal LV function.  She has been on furosemide due to lower extremity edema.  There has been some recent concerns of worsening that seems to become more problematic later in the day.  She is a resident at [facility] and receives physical therapy.  Apparently her physical therapist is concerned about the edema.  She has been prescribed support hose and has been attempting to adhere to a low-salt diet.  Medical regimen includes Relafen She has been on Eliquis in the past, but this was self discontinued due to cost concerns.  Past Medical History  Diagnosis Date  . Hypertension   . Bipolar 1 disorder ([place])   . Cancer of right breast ([place])   . Hyperlipemia   . Arthritis     "joints" (08/04/2014)  . Chronic lower back pain     Social History   Social History  . Marital Status: Widowed    Spouse Name: N/A  . Number of Children: N/A  . Years of Education: N/A   Occupational History  . Not on file.   Social History Main Topics  . Smoking status: Never Smoker   . Smokeless tobacco: Never Used  . Alcohol Use: No  . Drug Use: No  . Sexual Activity: No   Other Topics Concern  . Not on file   Social History Narrative    Past Surgical History  Procedure Laterality Date  . Cataract extraction w/ intraocular lens  implant, bilateral Bilateral   . Tonsillectomy    . Excisional hemorrhoidectomy    . Breast biopsy Right   . Mastectomy Right     cancer  . Breast reconstruction Right   . Placement of breast implants Bilateral     "had to take tissure out of left"  . Lumbar disc surgery  1972; 1973; 1985    "ruptured discs each  time"  . Back surgery    . Vaginal hysterectomy      Family History  Problem Relation Age of Onset  . Stroke Mother   . Heart disease Father   . Alzheimer's disease Sister   . Leukemia Brother   . Arthritis Maternal Grandmother     Allergies  Allergen Reactions  . Codeine Anaphylaxis  . Sulfa Antibiotics Rash    Current Outpatient Prescriptions on File Prior to Visit  Medication Sig Dispense Refill  . atorvastatin (LIPITOR) 20 MG tablet TAKE 1 TABLET BY MOUTH DAILY 90 tablet 1  . diltiazem (DILACOR XR) 180 MG 24 hr capsule Take 1 capsule (180 mg total) by mouth daily. 90 capsule 1  . escitalopram (LEXAPRO) 10 MG tablet TAKE 1 TABLET BY MOUTH ONCE A DAY 90 tablet 3  . furosemide (LASIX) 40 MG tablet TAKE 1 TABLET BY MOUTH EVERY DAY 90 tablet 0  . gabapentin (NEURONTIN) 300 MG capsule TAKE ONE CAPSULE BY MOUTH TWICE DAILY 180 capsule 3  . nabumetone (RELAFEN) 750 MG tablet TAKE 1 TABLET BY MOUTH TWICE DAILY 180 tablet 0  . pseudoephedrine (SUDAFED) 30 MG tablet Take 30 mg by mouth as needed for congestion.    . QUEtiapine (SEROQUEL)  25 MG tablet Take 1 tablet (25 mg total) by mouth 3 (three) times daily. 270 tablet 1  . traMADol (ULTRAM) 50 MG tablet TAKE 1 TABLET BY MOUTH FOUR TIMES DAILY 120 tablet 2  . traZODone (DESYREL) 150 MG tablet TAKE 1 TABLET BY MOUTH EVERY NIGHT AT BEDTIME 90 tablet 1   No current facility-administered medications on file prior to visit.    BP 140/80 mmHg  Pulse 65  Temp(Src) 97.9 F (36.6 C) (Oral)  Resp 18  Ht 5' 6.5" (1.689 m)  Wt 193 lb (87.544 kg)  BMI 30.69 kg/m2  SpO2 96%  LMP 10/10/1969      Review of Systems  Constitutional: Negative.   HENT: Negative for congestion, dental problem, hearing loss, rhinorrhea, sinus pressure, sore throat and tinnitus.   Eyes: Negative for pain, discharge and visual disturbance.  Respiratory: Negative for cough and shortness of breath.   Cardiovascular: Positive for leg swelling. Negative for  chest pain and palpitations.  Gastrointestinal: Negative for nausea, vomiting, abdominal pain, diarrhea, constipation, blood in stool and abdominal distention.  Genitourinary: Negative for dysuria, urgency, frequency, hematuria, flank pain, vaginal bleeding, vaginal discharge, difficulty urinating, vaginal pain and pelvic pain.  Musculoskeletal: Negative for joint swelling, arthralgias and gait problem.  Skin: Negative for rash.  Neurological: Negative for dizziness, syncope, speech difficulty, weakness, numbness and headaches.  Hematological: Negative for adenopathy.  Psychiatric/Behavioral: Negative for behavioral problems, dysphoric mood and agitation. The patient is not nervous/anxious.        Objective:   Physical Exam  Constitutional: She is oriented to person, place, and time. She appears well-developed and well-nourished.  HENT:  Head: Normocephalic.  Right Ear: External ear normal.  Left Ear: External ear normal.  Mouth/Throat: Oropharynx is clear and moist.  Eyes: Conjunctivae and EOM are normal. Pupils are equal, round, and reactive to light.  Neck: Normal range of motion. Neck supple. No thyromegaly present.  Cardiovascular: Normal rate, regular rhythm, normal heart sounds and intact distal pulses.   Pulmonary/Chest: Effort normal and breath sounds normal. No respiratory distress. She has no wheezes. She has no rales.  Abdominal: Soft. Bowel sounds are normal. She exhibits no mass. There is no tenderness.  Musculoskeletal: Normal range of motion. She exhibits edema.  Support stockings in place Very mild left ankle edema only No pedal edema  Lymphadenopathy:    She has no cervical adenopathy.  Neurological: She is alert and oriented to person, place, and time.  Skin: Skin is warm and dry. No rash noted.  Psychiatric: She has a normal mood and affect. Her behavior is normal.          Assessment & Plan:   Hypertension.  Lopressor, well-controlled.  Repeat BP 120 over  70.  Due to her peripheral edema we'll decrease diltiazem to 120 daily Lower extremity edema.  This is chronic and appears very mild.  We'll stress a low-salt diet.  Discontinue Relafen and decrease diltiazem Paroxysmal atrial fibrillation.  Patient has self discontinued Eliquis; will start Coumadin anticoagulation  PT/INR at Coumadin clinic in 3 days

## 2014-12-19 NOTE — Telephone Encounter (Signed)
Pt scheduled to be seen today

## 2014-12-19 NOTE — Progress Notes (Signed)
Pre visit review using our clinic review tool, if applicable. No additional management support is needed unless otherwise documented below in the visit note. 

## 2014-12-19 NOTE — Patient Instructions (Signed)
Limit your sodium (Salt) intake  Discontinue Relafen  Start Coumadin anticoagulation  Return in 3 days to check lab  Decrease diltiazem to 120 mg daily  Return in one month for follow-up  Warfarin: What You Need to Know Warfarin is an anticoagulant. Anticoagulants help prevent the formation of blood clots. They also help stop the growth of blood clots. Warfarin is sometimes referred to as a "blood thinner."  Normally, when body tissues are cut or damaged, the blood clots in order to prevent blood loss. Sometimes clots form inside your blood vessels and obstruct the flow of blood through your circulatory system (thrombosis). These clots may travel through your bloodstream and become lodged in smaller blood vessels in your brain, which can cause a stroke, or in your lungs (pulmonary embolism). WHO SHOULD USE WARFARIN? Warfarin is prescribed for people at risk of developing harmful blood clots:  People with surgically implanted mechanical heart valves, irregular heart rhythms called atrial fibrillation, and certain clotting disorders.  People who have developed harmful blood clotting in the past, including those who have had a stroke or a pulmonary embolism, or thrombosis in their legs (deep vein thrombosis [DVT]).  People with an existing blood clot, such as a pulmonary embolism. WARFARIN DOSING Warfarin tablets come in different strengths. Each tablet strength is a different color, with the amount of warfarin (in milligrams) clearly printed on the tablet. If the color of your tablet is different than usual when you receive a new prescription, report it immediately to your pharmacist or health care provider. WARFARIN MONITORING The goal of warfarin therapy is to lessen the clotting tendency of blood but not prevent clotting completely. Your health care provider will monitor the anticoagulation effect of warfarin closely and adjust your dose as needed. For your safety, blood tests called  prothrombin time (PT) or international normalized ratio (INR) are used to measure the effects of warfarin. Both of these tests can be done with a finger stick or a blood draw. The longer it takes the blood to clot, the higher the PT or INR. Your health care provider will inform you of your "target" PT or INR range. If, at any time, your PT or INR is above the target range, there is a risk of bleeding. If your PT or INR is below the target range, there is a risk of clotting. Whether you are started on warfarin while you are in the hospital or in your health care provider's office, you will need to have your PT or INR checked within one week of starting the medicine. Initially, some people are asked to have their PT or INR checked as much as twice a week. Once you are on a stable maintenance dose, the PT or INR is checked less often, usually once every 2 to 4 weeks. The warfarin dose may be adjusted if the PT or INR is not within the target range. It is important to keep all laboratory and health care provider follow-up appointments. Not keeping appointments could result in a chronic or permanent injury, pain, or disability because warfarin is a medicine that requires close monitoring. WHAT ARE THE SIDE EFFECTS OF WARFARIN?  Too much warfarin can cause bleeding (hemorrhage) from any part of the body. This may include bleeding from the gums, blood in the urine, bloody or dark stools, a nosebleed that is not easily stopped, coughing up blood, or vomiting blood.  Too little warfarin can increase the risk of blood clots.  Too little or too much  warfarin can also increase the risk of a stroke.  Warfarin use may cause a skin rash or irritation, an unusual fever, continual nausea or stomach upset, or severe pain in your joints or back. SPECIAL PRECAUTIONS WHILE TAKING WARFARIN Warfarin should be taken exactly as directed. It is very important to take warfarin as directed since bleeding or blood clots could  result in chronic or permanent injury, pain, or disability.  Take your medicine at the same time every day. If you forget to take your dose, you can take it if it is within 6 hours of when it was due.  Do not change the dose of warfarin on your own to make up for missed or extra doses.  If you miss more than 2 doses in a row, you should contact your health care provider for advice. Avoid situations that cause bleeding. You may have a tendency to bleed more easily than usual while taking warfarin. The following actions can limit bleeding:  Using a softer toothbrush.  Flossing with waxed floss rather than unwaxed floss.  Shaving with an Copy rather than a blade.  Limiting the use of sharp objects.  Avoiding potentially harmful activities, such as contact sports. Warfarin and Pregnancy or Breastfeeding  Warfarin is not advised during the first trimester of pregnancy due to an increased risk of birth defects. In certain situations, a woman may take warfarin after her first trimester of pregnancy. A woman who becomes pregnant or plans to become pregnant while taking warfarin should notify her health care provider immediately.  Although warfarin does not pass into breast milk, a woman who wishes to breastfeed while taking warfarin should also consult with her health care provider. Alcohol, Smoking, and Illicit Drug Use  Alcohol affects how warfarin works in the body. It is best to avoid alcoholic drinks or consume very small amounts while taking warfarin. In general, alcohol intake should be limited to 1 oz (30 mL) of liquor, 6 oz (180 mL) of wine, or 12 oz (360 mL) of beer each day. Notify your health care provider if you change your alcohol intake.  Smoking affects how warfarin works. It is best to avoid smoking while taking warfarin. Notify your health care provider if you change your smoking habits.  It is best to avoid all illicit drugs while taking warfarin since there are few  studies that show how warfarin interacts with these drugs. Other Medicines and Dietary Supplements Many prescription and over-the-counter medicines can interfere with warfarin. Be sure all of your health care providers know you are taking warfarin. Notify your health care provider who prescribed warfarin for you or your pharmacist before starting or stopping any new medicines, including over-the-counter vitamins, dietary supplements, and pain medicines. Your warfarin dose may need to be adjusted. Some common over-the-counter medicines that may increase the risk of bleeding while taking warfarin include:   Acetaminophen.  Aspirin.  Nonsteroidal anti-inflammatory medicines (NSAIDs), such as ibuprofen or naproxen.  Vitamin E. Dietary Considerations  Foods that have moderate or high amounts of vitamin K can interfere with warfarin. Avoid major changes in your diet or notify your health care provider before changing your diet. Eat a consistent amount of foods that have moderate or high amounts of vitamin K. Eating less foods containing vitamin K can increase the risk of bleeding. Eating more foods containing vitamin K can increase the risk of blood clots. Additional questions about dietary considerations can be discussed with a dietitian. Foods that are very high in  vitamin K:  Greens, such as Swiss chard and beet, collard, mustard, or turnip greens (fresh or frozen, cooked).  Kale (fresh or frozen, cooked).  Parsley (raw).  Spinach (cooked). Foods that are high in vitamin K:  Asparagus (frozen, cooked).  Broccoli.  Bok choy (cooked).  Brussels sprouts (fresh or frozen, cooked).  Cabbage (cooked).   Coleslaw. Foods that are moderately high in vitamin K:  Blueberries.  Black-eyed peas.  Endive (raw).  Green leaf lettuce (raw).  Green scallions (raw).  Kale (raw).  Okra (frozen, cooked).  Plantains (fried).  Romaine lettuce (raw).  Sauerkraut (canned).  Spinach  (raw). CALL YOUR CLINIC OR HEALTH CARE PROVIDER IF YOU:  Plan to have any surgery or procedure.  Feel sick, especially if you have diarrhea or vomiting.  Experience or anticipate any major changes in your diet.  Start or stop a prescription or over-the-counter medicine.  Become, plan to become, or think you may be pregnant.  Are having heavier than usual menstrual periods.  Have had a fall, accident, or any symptoms of bleeding or unusual bruising.  Develop an unusual fever. CALL 911 IN THE [place] OR GO TO THE EMERGENCY DEPARTMENT IF YOU:   Think you may be having an allergic reaction to warfarin. The signs of an allergic reaction could include itching, rash, hives, swelling, chest tightness, or trouble breathing.  See signs of blood in your urine. The signs could include reddish, pinkish, or tea-colored urine.  See signs of blood in your stools. The signs could include bright red or black stools.  Vomit or cough up blood. In these instances, the blood could have either a bright red or a "coffee-grounds" appearance.  Have bleeding that will not stop after applying pressure for 30 minutes such as cuts, nosebleeds, or other injuries.  Have severe pain in your joints or back.  Have a new and severe headache.  Have sudden weakness or numbness of your face, arm, or leg, especially on one side of your body.  Have sudden confusion or trouble understanding.  Have sudden trouble seeing in one or both eyes.  Have sudden trouble walking, dizziness, loss of balance, or coordination.  Have trouble speaking or understanding (aphasia).   This information is not intended to replace advice given to you by your health care provider. Make sure you discuss any questions you have with your health care provider.   Document Released: 02/25/2005 Document Revised: 03/18/2014 Document Reviewed: 08/21/2012 Elsevier Interactive Patient Education 2016 Elsevier Inc. Low-Sodium Eating Plan Sodium  raises blood pressure and causes water to be held in the body. Getting less sodium from food will help lower your blood pressure, reduce any swelling, and protect your heart, liver, and kidneys. We get sodium by adding salt (sodium chloride) to food. Most of our sodium comes from canned, boxed, and frozen foods. Restaurant foods, fast foods, and pizza are also very high in sodium. Even if you take medicine to lower your blood pressure or to reduce fluid in your body, getting less sodium from your food is important. WHAT IS MY PLAN? Most people should limit their sodium intake to 2,300 mg a day. Your health care provider recommends that you limit your sodium intake to __________ a day.  WHAT DO I NEED TO KNOW ABOUT THIS EATING PLAN? For the low-sodium eating plan, you will follow these general guidelines:  Choose foods with a % Daily Value for sodium of less than 5% (as listed on the food label).   Use salt-free  seasonings or herbs instead of table salt or sea salt.   Check with your health care provider or pharmacist before using salt substitutes.   Eat fresh foods.  Eat more vegetables and fruits.  Limit canned vegetables. If you do use them, rinse them well to decrease the sodium.   Limit cheese to 1 oz (28 g) per day.   Eat lower-sodium products, often labeled as "lower sodium" or "no salt added."  Avoid foods that contain monosodium glutamate (MSG). MSG is sometimes added to [place] food and some canned foods.  Check food labels (Nutrition Facts labels) on foods to learn how much sodium is in one serving.  Eat more home-cooked food and less restaurant, buffet, and fast food.  When eating at a restaurant, ask that your food be prepared with less salt, or no salt if possible.  HOW DO I READ FOOD LABELS FOR SODIUM INFORMATION? The Nutrition Facts label lists the amount of sodium in one serving of the food. If you eat more than one serving, you must multiply the listed amount  of sodium by the number of servings. Food labels may also identify foods as:  Sodium free--Less than 5 mg in a serving.  Very low sodium--35 mg or less in a serving.  Low sodium--140 mg or less in a serving.  Light in sodium--50% less sodium in a serving. For example, if a food that usually has 300 mg of sodium is changed to become light in sodium, it will have 150 mg of sodium.  Reduced sodium--25% less sodium in a serving. For example, if a food that usually has 400 mg of sodium is changed to reduced sodium, it will have 300 mg of sodium. WHAT FOODS CAN I EAT? Grains Low-sodium cereals, including oats, puffed wheat and rice, and shredded wheat cereals. Low-sodium crackers. Unsalted rice and pasta. Lower-sodium bread.  Vegetables Frozen or fresh vegetables. Low-sodium or reduced-sodium canned vegetables. Low-sodium or reduced-sodium tomato sauce and paste. Low-sodium or reduced-sodium tomato and vegetable juices.  Fruits Fresh, frozen, and canned fruit. Fruit juice.  Meat and Other Protein Products Low-sodium canned tuna and salmon. Fresh or frozen meat, poultry, seafood, and fish. Lamb. Unsalted nuts. Dried beans, peas, and lentils without added salt. Unsalted canned beans. Homemade soups without salt. Eggs.  Dairy Milk. Soy milk. Ricotta cheese. Low-sodium or reduced-sodium cheeses. Yogurt.  Condiments Fresh and dried herbs and spices. Salt-free seasonings. Onion and garlic powders. Low-sodium varieties of mustard and ketchup. Fresh or refrigerated horseradish. Lemon juice.  Fats and Oils Reduced-sodium salad dressings. Unsalted butter.  Other Unsalted popcorn and pretzels.  The items listed above may not be a complete list of recommended foods or beverages. Contact your dietitian for more options. WHAT FOODS ARE NOT RECOMMENDED? Grains Instant hot cereals. Bread stuffing, pancake, and biscuit mixes. Croutons. Seasoned rice or pasta mixes. Noodle soup cups. Boxed or  frozen macaroni and cheese. Self-rising flour. Regular salted crackers. Vegetables Regular canned vegetables. Regular canned tomato sauce and paste. Regular tomato and vegetable juices. Frozen vegetables in sauces. Salted [place] fries. Olives. Angie Fava. Relishes. Sauerkraut. Salsa. Meat and Other Protein Products Salted, canned, smoked, spiced, or pickled meats, seafood, or fish. Bacon, ham, sausage, hot dogs, corned beef, chipped beef, and packaged luncheon meats. Salt pork. Jerky. Pickled herring. Anchovies, regular canned tuna, and sardines. Salted nuts. Dairy Processed cheese and cheese spreads. Cheese curds. Blue cheese and cottage cheese. Buttermilk.  Condiments Onion and garlic salt, seasoned salt, table salt, and sea salt. Canned and packaged  gravies. Worcestershire sauce. Tartar sauce. Barbecue sauce. Teriyaki sauce. Soy sauce, including reduced sodium. Steak sauce. Fish sauce. Oyster sauce. Cocktail sauce. Horseradish that you find on the shelf. Regular ketchup and mustard. Meat flavorings and tenderizers. Bouillon cubes. Hot sauce. Tabasco sauce. Marinades. Taco seasonings. Relishes. Fats and Oils Regular salad dressings. Salted butter. Margarine. Ghee. Bacon fat.  Other Potato and tortilla chips. Corn chips and puffs. Salted popcorn and pretzels. Canned or dried soups. Pizza. Frozen entrees and pot pies.  The items listed above may not be a complete list of foods and beverages to avoid. Contact your dietitian for more information.   This information is not intended to replace advice given to you by your health care provider. Make sure you discuss any questions you have with your health care provider.   Document Released: 08/17/2001 Document Revised: 03/18/2014 Document Reviewed: 12/30/2012 Elsevier Interactive Patient Education [employer].

## 2014-12-21 DIAGNOSIS — M6281 Muscle weakness (generalized): Secondary | ICD-10-CM | POA: Diagnosis not present

## 2014-12-21 DIAGNOSIS — M25371 Other instability, right ankle: Secondary | ICD-10-CM | POA: Diagnosis not present

## 2014-12-22 ENCOUNTER — Ambulatory Visit (INDEPENDENT_AMBULATORY_CARE_PROVIDER_SITE_OTHER): Payer: Medicare Other | Admitting: General Practice

## 2014-12-22 DIAGNOSIS — Z5181 Encounter for therapeutic drug level monitoring: Secondary | ICD-10-CM | POA: Diagnosis not present

## 2014-12-22 DIAGNOSIS — I4891 Unspecified atrial fibrillation: Secondary | ICD-10-CM

## 2014-12-22 LAB — POCT INR: INR: 1.5

## 2014-12-22 NOTE — Patient Instructions (Signed)
A full discussion of the nature of anticoagulants has been carried out.  A benefit risk analysis has been presented to the patient, so that they understand the justification for choosing anticoagulation at this time. The need for frequent and regular monitoring, precise dosage adjustment and compliance is stressed.  Side effects of potential bleeding are discussed.  The patient should avoid any OTC items containing aspirin or ibuprofen, and should avoid great swings in general diet.

## 2014-12-22 NOTE — Progress Notes (Signed)
Pre visit review using our clinic review tool, if applicable. No additional management support is needed unless otherwise documented below in the visit note. 

## 2014-12-26 ENCOUNTER — Ambulatory Visit (INDEPENDENT_AMBULATORY_CARE_PROVIDER_SITE_OTHER): Payer: Medicare Other | Admitting: General Practice

## 2014-12-26 DIAGNOSIS — Z5181 Encounter for therapeutic drug level monitoring: Secondary | ICD-10-CM

## 2014-12-26 DIAGNOSIS — I4891 Unspecified atrial fibrillation: Secondary | ICD-10-CM | POA: Diagnosis not present

## 2014-12-26 LAB — POCT INR: INR: 3.2

## 2014-12-26 NOTE — Progress Notes (Signed)
Pre visit review using our clinic review tool, if applicable. No additional management support is needed unless otherwise documented below in the visit note. 

## 2014-12-28 DIAGNOSIS — M25371 Other instability, right ankle: Secondary | ICD-10-CM | POA: Diagnosis not present

## 2014-12-28 DIAGNOSIS — M6281 Muscle weakness (generalized): Secondary | ICD-10-CM | POA: Diagnosis not present

## 2015-01-02 ENCOUNTER — Ambulatory Visit (INDEPENDENT_AMBULATORY_CARE_PROVIDER_SITE_OTHER): Payer: Medicare Other | Admitting: General Practice

## 2015-01-02 DIAGNOSIS — Z5181 Encounter for therapeutic drug level monitoring: Secondary | ICD-10-CM | POA: Diagnosis not present

## 2015-01-02 DIAGNOSIS — I4891 Unspecified atrial fibrillation: Secondary | ICD-10-CM

## 2015-01-02 LAB — POCT INR: INR: 5.8

## 2015-01-02 NOTE — Progress Notes (Signed)
Pre visit review using our clinic review tool, if applicable. No additional management support is needed unless otherwise documented below in the visit note. 

## 2015-01-03 DIAGNOSIS — M25371 Other instability, right ankle: Secondary | ICD-10-CM | POA: Diagnosis not present

## 2015-01-03 DIAGNOSIS — M6281 Muscle weakness (generalized): Secondary | ICD-10-CM | POA: Diagnosis not present

## 2015-01-05 DIAGNOSIS — M25371 Other instability, right ankle: Secondary | ICD-10-CM | POA: Diagnosis not present

## 2015-01-05 DIAGNOSIS — M6281 Muscle weakness (generalized): Secondary | ICD-10-CM | POA: Diagnosis not present

## 2015-01-09 ENCOUNTER — Ambulatory Visit (INDEPENDENT_AMBULATORY_CARE_PROVIDER_SITE_OTHER): Payer: Medicare Other | Admitting: General Practice

## 2015-01-09 DIAGNOSIS — Z5181 Encounter for therapeutic drug level monitoring: Secondary | ICD-10-CM | POA: Diagnosis not present

## 2015-01-09 DIAGNOSIS — I4891 Unspecified atrial fibrillation: Secondary | ICD-10-CM

## 2015-01-09 LAB — POCT INR: INR: 2.9

## 2015-01-09 NOTE — Progress Notes (Signed)
Pre visit review using our clinic review tool, if applicable. No additional management support is needed unless otherwise documented below in the visit note. 

## 2015-01-10 ENCOUNTER — Telehealth: Payer: Self-pay | Admitting: Internal Medicine

## 2015-01-10 DIAGNOSIS — M25371 Other instability, right ankle: Secondary | ICD-10-CM | POA: Diagnosis not present

## 2015-01-10 DIAGNOSIS — M6281 Muscle weakness (generalized): Secondary | ICD-10-CM | POA: Diagnosis not present

## 2015-01-10 DIAGNOSIS — R0602 Shortness of breath: Secondary | ICD-10-CM

## 2015-01-10 DIAGNOSIS — I4891 Unspecified atrial fibrillation: Secondary | ICD-10-CM

## 2015-01-10 NOTE — Telephone Encounter (Signed)
Tracy Rodriguez PT is concerning about pt weight gain , sob with minimum walking and leg swelling.  Please advise. Tracy Rodriguez is inquiring about if md will approve home health nursing

## 2015-01-10 NOTE — Telephone Encounter (Signed)
Okay for home health nursing Schedule chest x-ray and BNP

## 2015-01-10 NOTE — Telephone Encounter (Signed)
Please see message and advise if you want to see pt sooner. Is scheduled to see you Nov 28 th. Also wanting [place], okay for orders?

## 2015-01-11 NOTE — Telephone Encounter (Signed)
Left message on voicemail to call office.  

## 2015-01-12 NOTE — Telephone Encounter (Signed)
Spoke to pt, told her received call from Therapist [name] about her SOB and swelling in legs. Told her Dr.K would like her to have a Chest x-ray done and lab work. Pt verbalized understanding. Told pt she can go to [facility] for x-ray and lab work and does not need an appt can just go there, gave pt address. Pt verbalized understanding and stated she will try to go today if not tomorrow. Asked pt how her breathing is today. Pt said it is okay. Told her all right as soon as we get the results I will call you. Pt verbalized understanding.

## 2015-01-16 ENCOUNTER — Other Ambulatory Visit (INDEPENDENT_AMBULATORY_CARE_PROVIDER_SITE_OTHER): Payer: Medicare Other

## 2015-01-16 ENCOUNTER — Ambulatory Visit (INDEPENDENT_AMBULATORY_CARE_PROVIDER_SITE_OTHER)
Admission: RE | Admit: 2015-01-16 | Discharge: 2015-01-16 | Disposition: A | Discharge status: Home / Self Care | Payer: Medicare Other | Source: Ambulatory Visit | Attending: Internal Medicine | Admitting: Internal Medicine

## 2015-01-16 ENCOUNTER — Ambulatory Visit: Payer: Medicare Other

## 2015-01-16 DIAGNOSIS — I4891 Unspecified atrial fibrillation: Secondary | ICD-10-CM | POA: Diagnosis not present

## 2015-01-16 DIAGNOSIS — R05 Cough: Secondary | ICD-10-CM | POA: Diagnosis not present

## 2015-01-16 DIAGNOSIS — R0602 Shortness of breath: Secondary | ICD-10-CM | POA: Diagnosis not present

## 2015-01-16 LAB — BRAIN NATRIURETIC PEPTIDE: Pro B Natriuretic peptide (BNP): 276 pg/mL — ABNORMAL HIGH (ref 0.0–100.0)

## 2015-01-16 NOTE — Telephone Encounter (Signed)
Left message on voicemail to call office.  

## 2015-01-17 NOTE — Telephone Encounter (Signed)
Spoke to pt, told her Dr.Hunter reveiwed Chest x-ray and blood work and it was okay. Need to monitor weight gain, swelling and SOB if increases needs to be evaluated. Pt verbalized understanding and stated she is having a cough and expectorating green sputum, has had cough for weeks. Asked pt if she could come in this week to see a provider? Pt said yes. Told her okay I am going to transfer you to scheduling. Pt verbalized understanding. Pt transferred to [facility] to schedule appt.

## 2015-01-17 NOTE — Telephone Encounter (Signed)
Dr. Yong Channel, can you please review chest x-ray and BNP done on pt for Dr.K. Thanks

## 2015-01-17 NOTE — Telephone Encounter (Signed)
CXR is read as no acute changes or extra fluid.  Considering patient age, pro BNP is not elevated to suggest heart failure.  Recently had an echocardiogram which would not suggest source of heart failure  If patient continues to gain weight, have swelling, have shortness of breath- needs to be evaluated sooner

## 2015-01-18 ENCOUNTER — Ambulatory Visit: Payer: Medicare Other | Admitting: Adult Health

## 2015-01-19 ENCOUNTER — Ambulatory Visit: Payer: Medicare Other | Admitting: Adult Health

## 2015-01-23 ENCOUNTER — Other Ambulatory Visit: Payer: Medicare Other

## 2015-01-25 ENCOUNTER — Telehealth: Payer: Self-pay | Admitting: Internal Medicine

## 2015-01-25 NOTE — Telephone Encounter (Signed)
FYI, see message. 

## 2015-01-25 NOTE — Telephone Encounter (Signed)
RN with [facility] called to let you know that Ms Tracy Rodriguez declined all services from [facility]. PT, OT and skilled nursing. Pt said that she does not need those services.

## 2015-01-26 ENCOUNTER — Ambulatory Visit (INDEPENDENT_AMBULATORY_CARE_PROVIDER_SITE_OTHER): Payer: Medicare Other | Admitting: General Practice

## 2015-01-26 ENCOUNTER — Other Ambulatory Visit: Payer: Self-pay | Admitting: Internal Medicine

## 2015-01-26 DIAGNOSIS — Z5181 Encounter for therapeutic drug level monitoring: Secondary | ICD-10-CM | POA: Diagnosis not present

## 2015-01-26 DIAGNOSIS — I4891 Unspecified atrial fibrillation: Secondary | ICD-10-CM

## 2015-01-26 LAB — POCT INR: INR: 1.7

## 2015-01-26 NOTE — Progress Notes (Signed)
Pre visit review using our clinic review tool, if applicable. No additional management support is needed unless otherwise documented below in the visit note. 

## 2015-01-26 NOTE — Progress Notes (Signed)
Ok with this plan 

## 2015-01-30 ENCOUNTER — Telehealth: Payer: Self-pay | Admitting: Internal Medicine

## 2015-01-30 MED ORDER — WARFARIN SODIUM 5 MG PO TABS: 5.0000 mg | ORAL_TABLET | Freq: Every day | ORAL | Status: DC

## 2015-01-30 NOTE — Telephone Encounter (Signed)
Rx sent to pharmacy   

## 2015-01-30 NOTE — Telephone Encounter (Signed)
[facility] said the patient is requesting a refill on her Warfarin 41ml tablets,

## 2015-02-01 ENCOUNTER — Emergency Department (HOSPITAL_COMMUNITY)
Admission: EM | Admit: 2015-02-01 | Discharge: 2015-02-02 | Disposition: A | Discharge status: Home / Self Care | Payer: Medicare Other | Attending: Emergency Medicine | Admitting: Emergency Medicine

## 2015-02-01 DIAGNOSIS — R040 Epistaxis: Secondary | ICD-10-CM

## 2015-02-01 DIAGNOSIS — G8929 Other chronic pain: Secondary | ICD-10-CM | POA: Insufficient documentation

## 2015-02-01 DIAGNOSIS — R030 Elevated blood-pressure reading, without diagnosis of hypertension: Secondary | ICD-10-CM | POA: Diagnosis not present

## 2015-02-01 DIAGNOSIS — I10 Essential (primary) hypertension: Secondary | ICD-10-CM | POA: Insufficient documentation

## 2015-02-01 DIAGNOSIS — Z79899 Other long term (current) drug therapy: Secondary | ICD-10-CM | POA: Insufficient documentation

## 2015-02-01 DIAGNOSIS — Z853 Personal history of malignant neoplasm of breast: Secondary | ICD-10-CM | POA: Diagnosis not present

## 2015-02-01 DIAGNOSIS — E785 Hyperlipidemia, unspecified: Secondary | ICD-10-CM | POA: Insufficient documentation

## 2015-02-01 DIAGNOSIS — M1990 Unspecified osteoarthritis, unspecified site: Secondary | ICD-10-CM | POA: Insufficient documentation

## 2015-02-01 DIAGNOSIS — F319 Bipolar disorder, unspecified: Secondary | ICD-10-CM | POA: Diagnosis not present

## 2015-02-01 LAB — CBC WITH DIFFERENTIAL/PLATELET
Basophils Absolute: 0 10*3/uL (ref 0.0–0.1)
Basophils Relative: 0 %
Eosinophils Absolute: 0.1 10*3/uL (ref 0.0–0.7)
Eosinophils Relative: 2 %
HCT: 37.8 % (ref 36.0–46.0)
Hemoglobin: 12 g/dL (ref 12.0–15.0)
Lymphocytes Relative: 24 %
Lymphs Abs: 1.6 10*3/uL (ref 0.7–4.0)
MCH: 28.4 pg (ref 26.0–34.0)
MCHC: 31.7 g/dL (ref 30.0–36.0)
MCV: 89.6 fL (ref 78.0–100.0)
Monocytes Absolute: 0.5 10*3/uL (ref 0.1–1.0)
Monocytes Relative: 7 %
Neutro Abs: 4.4 10*3/uL (ref 1.7–7.7)
Neutrophils Relative %: 67 %
Platelets: 223 10*3/uL (ref 150–400)
RBC: 4.22 MIL/uL (ref 3.87–5.11)
RDW: 14.6 % (ref 11.5–15.5)
WBC: 6.7 10*3/uL (ref 4.0–10.5)

## 2015-02-01 LAB — I-STAT CHEM 8, ED
BUN: 18 mg/dL (ref 6–20)
Calcium, Ion: 1.09 mmol/L — ABNORMAL LOW (ref 1.13–1.30)
Chloride: 105 mmol/L (ref 101–111)
Creatinine, Ser: 1.3 mg/dL — ABNORMAL HIGH (ref 0.44–1.00)
Glucose, Bld: 99 mg/dL (ref 65–99)
HCT: 41 % (ref 36.0–46.0)
Hemoglobin: 13.9 g/dL (ref 12.0–15.0)
Potassium: 3.7 mmol/L (ref 3.5–5.1)
Sodium: 141 mmol/L (ref 135–145)
TCO2: 22 mmol/L (ref 0–100)

## 2015-02-01 LAB — PROTIME-INR
INR: 2.05 — ABNORMAL HIGH (ref 0.00–1.49)
Prothrombin Time: 23 seconds — ABNORMAL HIGH (ref 11.6–15.2)

## 2015-02-01 MED ORDER — COCAINE HCL 4 % EX SOLN
4.0000 mL | Freq: Once | CUTANEOUS | Status: AC
  Administered 2015-02-01: 4 mL via TOPICAL
  Filled 2015-02-01: qty 4

## 2015-02-01 MED ORDER — OXYMETAZOLINE HCL 0.05 % NA SOLN
1.0000 | Freq: Once | NASAL | Status: AC
  Administered 2015-02-01: 1 via NASAL
  Filled 2015-02-01: qty 15

## 2015-02-01 NOTE — Discharge Instructions (Signed)
Nosebleed Nosebleeds are common. A nosebleed can be caused by many things, including:  Getting hit hard in the nose.  Infections.  Dryness in your nose.  A dry climate.  Medicines.  Picking your nose.  Your home heating and cooling systems. HOME CARE   Try controlling your nosebleed by pinching your nostrils gently. Do this for at least 10 minutes.  Avoid blowing or sniffing your nose for a number of hours after having a nosebleed.  Do not put gauze inside of your nose yourself. If your nose was packed by your doctor, try to keep the pack inside of your nose until your doctor removes it.  If a gauze pack was used and it starts to fall out, gently replace it or cut off the end of it.  If a balloon catheter was used to pack your nose, do not cut or remove it unless told by your doctor.  Avoid lying down while you are having a nosebleed. Sit up and lean forward.  Use a nasal spray decongestant to help with a nosebleed as told by your doctor.  Do not use petroleum jelly or mineral oil in your nose. These can drip into your lungs.  Keep your house humid by using:  Less air conditioning.  A humidifier.  Aspirin and blood thinners make bleeding more likely. If you are prescribed these medicines and you have nosebleeds, ask your doctor if you should stop taking the medicines or adjust the dose. Do not stop medicines unless told by your doctor.  Resume your normal activities as you are able. Avoid straining, lifting, or bending at your waist for several days.  If your nosebleed was caused by dryness in your nose, use over-the-counter saline nasal spray or gel. If you must use a lubricant:  Choose one that is water-soluble.  Use it only as needed.  Do not use it within several hours of lying down.  Keep all follow-up visits as told by your doctor. This is important. GET HELP IF:  You have a fever.  You get frequent nosebleeds.  You are getting nosebleeds more  often. GET HELP RIGHT AWAY IF:  Your nosebleed lasts longer than 20 minutes.  Your nosebleed occurs after an injury to your face, and your nose looks crooked or broken.  You have unusual bleeding from other parts of your body.  You have unusual bruising on other parts of your body.  You feel light-headed or dizzy.  You become sweaty.  You throw up (vomit) blood.  You have a nosebleed after a head injury.   This information is not intended to replace advice given to you by your health care provider. Make sure you discuss any questions you have with your health care provider.   Document Released: 12/05/2007 Document Revised: 03/18/2014 Document Reviewed: 10/11/2013 Elsevier Interactive Patient Education [employer]. If you have a return of bleeding please hold pressure just below the bones in your nose for a full ten minutes if still bleeding after that please return to the ED for further treatment

## 2015-02-01 NOTE — ED Provider Notes (Addendum)
CSN: [id number]     Arrival date & time 02/01/15  2211 History   First MD Initiated Contact with Patient 02/01/15 2214     Chief Complaint  Patient presents with  . Epistaxis     (Consider location/radiation/quality/duration/timing/severity/associated sxs/prior Treatment) HPI Comments: Bleed from L nare spontaneous  The history is provided by the patient.    Past Medical History  Diagnosis Date  . Hypertension   . Bipolar 1 disorder ([place])   . Cancer of right breast ([place])   . Hyperlipemia   . Arthritis     "joints" (08/04/2014)  . Chronic lower back pain    Past Surgical History  Procedure Laterality Date  . Cataract extraction w/ intraocular lens  implant, bilateral Bilateral   . Tonsillectomy    . Excisional hemorrhoidectomy    . Breast biopsy Right   . Mastectomy Right     cancer  . Breast reconstruction Right   . Placement of breast implants Bilateral     "had to take tissure out of left"  . Lumbar disc surgery  1972; 1973; 1985    "ruptured discs each time"  . Back surgery    . Vaginal hysterectomy     Family History  Problem Relation Age of Onset  . Stroke Mother   . Heart disease Father   . Alzheimer's disease Sister   . Leukemia Brother   . Arthritis Maternal Grandmother    Social History  Substance Use Topics  . Smoking status: Never Smoker   . Smokeless tobacco: Never Used  . Alcohol Use: No   OB History    No data available     Review of Systems  Constitutional: Negative for fever.  HENT: Positive for nosebleeds.   Neurological: Negative for dizziness and headaches.  All other systems reviewed and are negative.     Allergies  Codeine and Sulfa antibiotics  Home Medications   Prior to Admission medications   Medication Sig Start Date End Date Taking? Authorizing Provider  atorvastatin (LIPITOR) 20 MG tablet TAKE 1 TABLET BY MOUTH DAILY 01/27/15  Yes Marletta Lor, MD  diltiazem (DILACOR XR) 120 MG 24 hr capsule Take 1 capsule  (120 mg total) by mouth daily. 12/19/14  Yes Marletta Lor, MD  escitalopram (LEXAPRO) 10 MG tablet TAKE 1 TABLET BY MOUTH ONCE A DAY 01/20/14  Yes Marletta Lor, MD  gabapentin (NEURONTIN) 300 MG capsule TAKE ONE CAPSULE BY MOUTH TWICE DAILY 12/12/14  Yes Marletta Lor, MD  loperamide (IMODIUM A-D) 2 MG tablet Take 2-4 mg by mouth 4 (four) times daily as needed for diarrhea or loose stools.   Yes Historical Provider, MD  pseudoephedrine (SUDAFED) 30 MG tablet Take 30 mg by mouth as needed for congestion.   Yes Historical Provider, MD  QUEtiapine (SEROQUEL) 25 MG tablet Take 1 tablet (25 mg total) by mouth 3 (three) times daily. Patient taking differently: Take 25-50 mg by mouth 2 (two) times daily. Take 25mg  in the morning and 50mg  in the evening 07/14/14  Yes Marletta Lor, MD  traMADol (ULTRAM) 50 MG tablet TAKE 1 TABLET BY MOUTH FOUR TIMES DAILY Patient taking differently: Take 100mg  twice daily. 11/17/14  Yes Marletta Lor, MD  traZODone (DESYREL) 150 MG tablet TAKE 1 TABLET BY MOUTH EVERY NIGHT AT BEDTIME Patient taking differently: Take 75 mg by mouth at bedtime.  01/20/14  Yes Marletta Lor, MD  warfarin (COUMADIN) 5 MG tablet Take 1 tablet (5 mg total)  by mouth daily. Patient taking differently: Take 2.5-5 mg by mouth daily. Take 2.5mg  everyday except Sunday take 5mg . 01/30/15  Yes Marletta Lor, MD   BP 202/85 mmHg  Pulse 66  Resp 18  SpO2 96%  LMP 10/10/1969 Physical Exam  Constitutional: She appears well-developed.  HENT:  Head: Normocephalic.  Nose: Epistaxis is observed.  Eyes: Pupils are equal, round, and reactive to light.  Neck: Normal range of motion.  Cardiovascular: Normal rate.   Pulmonary/Chest: Effort normal.  Abdominal: Soft.  Musculoskeletal: Normal range of motion.  Neurological: She is alert.  Skin: Skin is warm.  Vitals reviewed.   ED Course  Procedures (including critical care time) Labs Review Labs Reviewed   PROTIME-INR - Abnormal; Notable for the following:    Prothrombin Time 23.0 (*)    INR 2.05 (*)    All other components within normal limits  I-STAT CHEM 8, ED - Abnormal; Notable for the following:    Creatinine, Ser 1.30 (*)    Calcium, Ion 1.09 (*)    All other components within normal limits  CBC WITH DIFFERENTIAL/PLATELET    Imaging Review No results found. I have personally reviewed and evaluated these images and lab results as part of my medical decision-making.   EKG Interpretation None     Held pressure fro 10 minutes than Afrin bleeding stopped for about 10 minutes than a slow ooze returned  Cocaine applied via Qtip with resolution of bleeding' INR CBC an dI-stat checked with normal results Patient give return instructions if needed MDM   Final diagnoses:  Epistaxis         Junius Creamer, NP 02/01/15 [place], MD 02/02/15 0021  Junius Creamer, NP 02/15/15 2011  Sherwood Gambler, MD 02/19/15 [phone]

## 2015-02-01 NOTE — ED Notes (Addendum)
Patient went to wipe her nose and noted it was bleeding. Started coumadin 1 month ago.  Denies trauma.  Patient given 2 sprays of afrin by EMS.

## 2015-02-03 ENCOUNTER — Other Ambulatory Visit: Payer: Self-pay | Admitting: Internal Medicine

## 2015-02-06 ENCOUNTER — Ambulatory Visit (INDEPENDENT_AMBULATORY_CARE_PROVIDER_SITE_OTHER): Payer: Medicare Other | Admitting: Internal Medicine

## 2015-02-06 ENCOUNTER — Encounter: Payer: Self-pay | Admitting: Internal Medicine

## 2015-02-06 VITALS — BP 140/70 | HR 77 | Temp 98.7°F | Resp 20 | Ht 66.5 in | Wt 193.0 lb

## 2015-02-06 DIAGNOSIS — I10 Essential (primary) hypertension: Secondary | ICD-10-CM | POA: Diagnosis not present

## 2015-02-06 DIAGNOSIS — Z5181 Encounter for therapeutic drug level monitoring: Secondary | ICD-10-CM

## 2015-02-06 DIAGNOSIS — I4891 Unspecified atrial fibrillation: Secondary | ICD-10-CM | POA: Diagnosis not present

## 2015-02-06 MED ORDER — FUROSEMIDE 40 MG PO TABS: 40.0000 mg | ORAL_TABLET | Freq: Every day | ORAL | Status: DC

## 2015-02-06 NOTE — Patient Instructions (Signed)
Limit your sodium (Salt) intake  Please check your blood pressure on a regular basis.  If it is consistently greater than 150/90, please make an office appointment.  Return in 3 months for follow-up  

## 2015-02-06 NOTE — Progress Notes (Signed)
Pre visit review using our clinic review tool, if applicable. No additional management support is needed unless otherwise documented below in the visit note. 

## 2015-02-06 NOTE — Progress Notes (Signed)
Subjective:    Patient ID: Tracy Rodriguez, female    DOB: [birth year], 82 y.o.   MRN: [id number]  HPI  81 year old patient who is seen today in follow-up.  She has a history of paroxysmal atrial fibrillation and Coumadin anticoagulation has been resumed. She does have a history of lower extremity edema.  Apparently she no longer is on furosemide.  She is in the ED recently with epistaxis.  INR was therapeutic at 2.  She has essential hypertension which has been stable in spite of slight down titration of diltiazem due to the peripheral edema.  No new concerns or complaints.  Weight is unchanged  Wt Readings from Last 3 Encounters:  02/06/15 193 lb (87.544 kg)  12/19/14 193 lb (87.544 kg)  11/25/14 190 lb (86.183 kg)    Past Medical History  Diagnosis Date  . Hypertension   . Bipolar 1 disorder ([place])   . Cancer of right breast ([place])   . Hyperlipemia   . Arthritis     "joints" (08/04/2014)  . Chronic lower back pain     Social History   Social History  . Marital Status: Widowed    Spouse Name: N/A  . Number of Children: N/A  . Years of Education: N/A   Occupational History  . Not on file.   Social History Main Topics  . Smoking status: Never Smoker   . Smokeless tobacco: Never Used  . Alcohol Use: No  . Drug Use: No  . Sexual Activity: No   Other Topics Concern  . Not on file   Social History Narrative    Past Surgical History  Procedure Laterality Date  . Cataract extraction w/ intraocular lens  implant, bilateral Bilateral   . Tonsillectomy    . Excisional hemorrhoidectomy    . Breast biopsy Right   . Mastectomy Right     cancer  . Breast reconstruction Right   . Placement of breast implants Bilateral     "had to take tissure out of left"  . Lumbar disc surgery  1972; 1973; 1985    "ruptured discs each time"  . Back surgery    . Vaginal hysterectomy      Family History  Problem Relation Age of Onset  . Stroke Mother   . Heart disease Father   .  Alzheimer's disease Sister   . Leukemia Brother   . Arthritis Maternal Grandmother     Allergies  Allergen Reactions  . Codeine Anaphylaxis  . Sulfa Antibiotics Rash    Current Outpatient Prescriptions on File Prior to Visit  Medication Sig Dispense Refill  . atorvastatin (LIPITOR) 20 MG tablet TAKE 1 TABLET BY MOUTH DAILY 90 tablet 1  . diltiazem (DILACOR XR) 120 MG 24 hr capsule Take 1 capsule (120 mg total) by mouth daily. 90 capsule 2  . escitalopram (LEXAPRO) 10 MG tablet TAKE 1 TABLET BY MOUTH ONCE DAILY 90 tablet 1  . gabapentin (NEURONTIN) 300 MG capsule TAKE ONE CAPSULE BY MOUTH TWICE DAILY 180 capsule 3  . loperamide (IMODIUM A-D) 2 MG tablet Take 2-4 mg by mouth 4 (four) times daily as needed for diarrhea or loose stools.    . pseudoephedrine (SUDAFED) 30 MG tablet Take 30 mg by mouth as needed for congestion.    . QUEtiapine (SEROQUEL) 25 MG tablet Take 1 tablet (25 mg total) by mouth 3 (three) times daily. (Patient taking differently: Take 25-50 mg by mouth 2 (two) times daily. Take 25mg  in the morning and 50mg   in the evening) 270 tablet 1  . traMADol (ULTRAM) 50 MG tablet TAKE 1 TABLET BY MOUTH FOUR TIMES DAILY (Patient taking differently: Take 100mg  twice daily.) 120 tablet 2  . traZODone (DESYREL) 150 MG tablet TAKE 1 TABLET BY MOUTH EVERY NIGHT AT BEDTIME (Patient taking differently: Take 75 mg by mouth at bedtime. ) 90 tablet 1  . warfarin (COUMADIN) 5 MG tablet Take 1 tablet (5 mg total) by mouth daily. (Patient taking differently: Take 2.5-5 mg by mouth daily. Take 2.5mg  everyday except Sunday take 5mg .) 30 tablet 2   No current facility-administered medications on file prior to visit.    BP 140/70 mmHg  Pulse 77  Temp(Src) 98.7 F (37.1 C) (Oral)  Resp 20  Ht 5' 6.5" (1.689 m)  Wt 193 lb (87.544 kg)  BMI 30.69 kg/m2  SpO2 97%  LMP 10/10/1969    Review of Systems  Constitutional: Negative.   HENT: Positive for nosebleeds. Negative for congestion, dental  problem, hearing loss, rhinorrhea, sinus pressure, sore throat and tinnitus.   Eyes: Negative for pain, discharge and visual disturbance.  Respiratory: Positive for shortness of breath. Negative for cough.   Cardiovascular: Positive for leg swelling. Negative for chest pain and palpitations.  Gastrointestinal: Negative for nausea, vomiting, abdominal pain, diarrhea, constipation, blood in stool and abdominal distention.  Genitourinary: Negative for dysuria, urgency, frequency, hematuria, flank pain, vaginal bleeding, vaginal discharge, difficulty urinating, vaginal pain and pelvic pain.  Musculoskeletal: Negative for joint swelling, arthralgias and gait problem.  Skin: Negative for rash.  Neurological: Negative for dizziness, syncope, speech difficulty, weakness, numbness and headaches.  Hematological: Negative for adenopathy.  Psychiatric/Behavioral: Negative for behavioral problems, dysphoric mood and agitation. The patient is not nervous/anxious.        Objective:   Physical Exam  Constitutional: She is oriented to person, place, and time. She appears well-developed and well-nourished.  HENT:  Head: Normocephalic.  Right Ear: External ear normal.  Left Ear: External ear normal.  Mouth/Throat: Oropharynx is clear and moist.  Eyes: Conjunctivae and EOM are normal. Pupils are equal, round, and reactive to light.  Neck: Normal range of motion. Neck supple. No thyromegaly present.  Cardiovascular: Normal rate, regular rhythm, normal heart sounds and intact distal pulses.   Pulmonary/Chest: Effort normal and breath sounds normal. No respiratory distress. She has no wheezes. She has no rales.  Abdominal: Soft. Bowel sounds are normal. She exhibits no mass. There is no tenderness.  Musculoskeletal: Normal range of motion. She exhibits edema.  Support hose in place Plus 1 edema  Lymphadenopathy:    She has no cervical adenopathy.  Neurological: She is alert and oriented to person, place,  and time.  Skin: Skin is warm and dry. No rash noted.  Psychiatric: She has a normal mood and affect. Her behavior is normal.          Assessment & Plan:   Hypertension Mild lower extremity edema.  Will resume furosemide Paroxysmal atrial fibrillation.  We'll continue Coumadin anticoagulation  Recheck 3 months

## 2015-02-09 ENCOUNTER — Ambulatory Visit: Payer: Medicare Other

## 2015-02-15 ENCOUNTER — Telehealth: Payer: Self-pay | Admitting: Internal Medicine

## 2015-02-15 MED ORDER — TRAMADOL HCL 50 MG PO TABS: 50.0000 mg | ORAL_TABLET | Freq: Four times a day (QID) | ORAL | Status: DC

## 2015-02-15 NOTE — Telephone Encounter (Signed)
Tried to contact pt no answer voicemail not set up. Rx called into pharmacy.

## 2015-02-15 NOTE — Telephone Encounter (Signed)
Tracy Rodriguez called saying she needs a refill of Tramadol sent to her pharmacy.   Pt ph# [phone] Thank you.

## 2015-02-21 ENCOUNTER — Ambulatory Visit: Payer: Medicare Other | Admitting: Internal Medicine

## 2015-02-27 ENCOUNTER — Ambulatory Visit (INDEPENDENT_AMBULATORY_CARE_PROVIDER_SITE_OTHER): Payer: Medicare Other | Admitting: General Practice

## 2015-02-27 DIAGNOSIS — I4891 Unspecified atrial fibrillation: Secondary | ICD-10-CM | POA: Diagnosis not present

## 2015-02-27 DIAGNOSIS — Z5181 Encounter for therapeutic drug level monitoring: Secondary | ICD-10-CM | POA: Diagnosis not present

## 2015-02-27 LAB — POCT INR: INR: 1.7

## 2015-02-27 NOTE — Progress Notes (Signed)
Pre visit review using our clinic review tool, if applicable. No additional management support is needed unless otherwise documented below in the visit note. 

## 2015-03-16 ENCOUNTER — Ambulatory Visit: Payer: Medicare Other

## 2015-03-20 ENCOUNTER — Ambulatory Visit: Payer: Medicare Other

## 2015-03-23 ENCOUNTER — Ambulatory Visit (INDEPENDENT_AMBULATORY_CARE_PROVIDER_SITE_OTHER): Payer: Medicare Other | Admitting: General Practice

## 2015-03-23 DIAGNOSIS — I4891 Unspecified atrial fibrillation: Secondary | ICD-10-CM

## 2015-03-23 DIAGNOSIS — Z5181 Encounter for therapeutic drug level monitoring: Secondary | ICD-10-CM

## 2015-03-23 LAB — POCT INR: INR: 2

## 2015-03-23 NOTE — Progress Notes (Signed)
Pre visit review using our clinic review tool, if applicable. No additional management support is needed unless otherwise documented below in the visit note. 

## 2015-03-31 ENCOUNTER — Ambulatory Visit (INDEPENDENT_AMBULATORY_CARE_PROVIDER_SITE_OTHER): Payer: Medicare Other | Admitting: Podiatry

## 2015-03-31 ENCOUNTER — Encounter: Payer: Self-pay | Admitting: Podiatry

## 2015-03-31 VITALS — BP 145/78 | HR 75 | Resp 16

## 2015-03-31 DIAGNOSIS — L600 Ingrowing nail: Secondary | ICD-10-CM | POA: Diagnosis not present

## 2015-03-31 NOTE — Patient Instructions (Signed)

## 2015-03-31 NOTE — Progress Notes (Signed)
Subjective:     Patient ID: Tracy Rodriguez, female   DOB: 02-16-1932, 82 y.o.   MRN: [id number]  HPI patient states the second toes of both feet are sore and I've tried to trim them without relief   Review of Systems     Objective:   Physical Exam Neurovascular status intact no other health history changes good digital perfusion with incurvated nailbed second medial borders bilateral with pain    Assessment:     Ingrown toenail deformity second bilateral with pain    Plan:     Reviewed condition and recommended removal of the corners and explained procedures and risk. Infiltrated each toe 60 mg like Marcaine mixture remove the medial borders exposed matrix and applied phenol 3 applications 30 seconds followed by alcohol lavage and sterile dressing. Gave instructions on soaks and reappoint

## 2015-04-11 ENCOUNTER — Telehealth: Payer: Self-pay | Admitting: *Deleted

## 2015-04-11 NOTE — Telephone Encounter (Signed)
Tried to call patient at [phone] (Home #) to check how they were doing from their ingrown toenail procedure that was performed on Friday, March 31, 2015. I could not leave a message because their voicemail box was not set up.

## 2015-04-13 ENCOUNTER — Other Ambulatory Visit: Payer: Self-pay | Admitting: Internal Medicine

## 2015-04-19 ENCOUNTER — Ambulatory Visit: Payer: Medicare Other

## 2015-04-24 ENCOUNTER — Ambulatory Visit (INDEPENDENT_AMBULATORY_CARE_PROVIDER_SITE_OTHER): Payer: Medicare Other | Admitting: General Practice

## 2015-04-24 DIAGNOSIS — Z5181 Encounter for therapeutic drug level monitoring: Secondary | ICD-10-CM

## 2015-04-24 DIAGNOSIS — I4891 Unspecified atrial fibrillation: Secondary | ICD-10-CM

## 2015-04-24 LAB — POCT INR: INR: 1.5

## 2015-04-24 NOTE — Progress Notes (Signed)
Pre visit review using our clinic review tool, if applicable. No additional management support is needed unless otherwise documented below in the visit note. INR is low today.  Patient denies missed doses or changes in diet.  Patient boosted for 2 days and then increased dosage overall by 10 percent.  Encouraged patient to purchase and use pill box to help manage medication and to keep diet steady.  Pt verbalized understanding.

## 2015-05-09 ENCOUNTER — Encounter: Payer: Medicare Other | Admitting: Internal Medicine

## 2015-05-09 ENCOUNTER — Other Ambulatory Visit: Payer: Medicare Other

## 2015-05-15 ENCOUNTER — Ambulatory Visit (INDEPENDENT_AMBULATORY_CARE_PROVIDER_SITE_OTHER): Payer: Medicare Other | Admitting: Internal Medicine

## 2015-05-15 ENCOUNTER — Ambulatory Visit (INDEPENDENT_AMBULATORY_CARE_PROVIDER_SITE_OTHER): Payer: Medicare Other | Admitting: General Practice

## 2015-05-15 ENCOUNTER — Encounter: Payer: Self-pay | Admitting: Internal Medicine

## 2015-05-15 ENCOUNTER — Ambulatory Visit: Payer: Medicare Other

## 2015-05-15 VITALS — BP 150/90 | HR 75 | Temp 98.1°F | Resp 20 | Ht 65.5 in | Wt 191.0 lb

## 2015-05-15 DIAGNOSIS — I10 Essential (primary) hypertension: Secondary | ICD-10-CM

## 2015-05-15 DIAGNOSIS — G8929 Other chronic pain: Secondary | ICD-10-CM

## 2015-05-15 DIAGNOSIS — Z0000 Encounter for general adult medical examination without abnormal findings: Secondary | ICD-10-CM | POA: Diagnosis not present

## 2015-05-15 DIAGNOSIS — E669 Obesity, unspecified: Secondary | ICD-10-CM | POA: Diagnosis not present

## 2015-05-15 DIAGNOSIS — I4891 Unspecified atrial fibrillation: Secondary | ICD-10-CM

## 2015-05-15 DIAGNOSIS — Z5181 Encounter for therapeutic drug level monitoring: Secondary | ICD-10-CM | POA: Diagnosis not present

## 2015-05-15 DIAGNOSIS — M549 Dorsalgia, unspecified: Secondary | ICD-10-CM | POA: Diagnosis not present

## 2015-05-15 LAB — CBC WITH DIFFERENTIAL/PLATELET
Basophils Absolute: 0 10*3/uL (ref 0.0–0.1)
Basophils Relative: 0.3 % (ref 0.0–3.0)
Eosinophils Absolute: 0.1 10*3/uL (ref 0.0–0.7)
Eosinophils Relative: 1.8 % (ref 0.0–5.0)
HCT: 37.2 % (ref 36.0–46.0)
Hemoglobin: 12.3 g/dL (ref 12.0–15.0)
Lymphocytes Relative: 19.2 % (ref 12.0–46.0)
Lymphs Abs: 1.1 10*3/uL (ref 0.7–4.0)
MCHC: 33 g/dL (ref 30.0–36.0)
MCV: 82.9 fl (ref 78.0–100.0)
Monocytes Absolute: 0.5 10*3/uL (ref 0.1–1.0)
Monocytes Relative: 8.3 % (ref 3.0–12.0)
Neutro Abs: 3.9 10*3/uL (ref 1.4–7.7)
Neutrophils Relative %: 70.4 % (ref 43.0–77.0)
Platelets: 235 10*3/uL (ref 150.0–400.0)
RBC: 4.49 Mil/uL (ref 3.87–5.11)
RDW: 17.1 % — ABNORMAL HIGH (ref 11.5–15.5)
WBC: 5.6 10*3/uL (ref 4.0–10.5)

## 2015-05-15 LAB — COMPREHENSIVE METABOLIC PANEL
ALT: 11 U/L (ref 0–35)
AST: 20 U/L (ref 0–37)
Albumin: 4.2 g/dL (ref 3.5–5.2)
Alkaline Phosphatase: 87 U/L (ref 39–117)
BUN: 15 mg/dL (ref 6–23)
CO2: 25 mEq/L (ref 19–32)
Calcium: 9.1 mg/dL (ref 8.4–10.5)
Chloride: 105 mEq/L (ref 96–112)
Creatinine, Ser: 1.12 mg/dL (ref 0.40–1.20)
GFR: 49.49 mL/min — ABNORMAL LOW (ref >60.00)
Glucose, Bld: 90 mg/dL (ref 70–99)
Potassium: 3.9 mEq/L (ref 3.5–5.1)
Sodium: 140 mEq/L (ref 135–145)
Total Bilirubin: 0.6 mg/dL (ref 0.2–1.2)
Total Protein: 7 g/dL (ref 6.0–8.3)

## 2015-05-15 LAB — LIPID PANEL
Cholesterol: 174 mg/dL (ref 0–200)
HDL: 61.8 mg/dL (ref >39.00)
LDL Cholesterol: 95 mg/dL (ref 0–99)
NonHDL: 112.52
Total CHOL/HDL Ratio: 3
Triglycerides: 89 mg/dL (ref 0.0–149.0)
VLDL: 17.8 mg/dL (ref 0.0–40.0)

## 2015-05-15 LAB — TSH: TSH: 2.17 u[IU]/mL (ref 0.35–4.50)

## 2015-05-15 LAB — POCT INR: INR: 2.2

## 2015-05-15 MED ORDER — QUETIAPINE FUMARATE 25 MG PO TABS: 25.0000 mg | ORAL_TABLET | Freq: Three times a day (TID) | ORAL | Status: DC

## 2015-05-15 MED ORDER — GABAPENTIN 300 MG PO CAPS: 300.0000 mg | ORAL_CAPSULE | Freq: Two times a day (BID) | ORAL | Status: DC

## 2015-05-15 MED ORDER — ATORVASTATIN CALCIUM 20 MG PO TABS: 20.0000 mg | ORAL_TABLET | Freq: Every day | ORAL | Status: DC

## 2015-05-15 MED ORDER — ESCITALOPRAM OXALATE 10 MG PO TABS: 10.0000 mg | ORAL_TABLET | Freq: Every day | ORAL | Status: DC

## 2015-05-15 MED ORDER — DILTIAZEM HCL ER 120 MG PO CP24: 120.0000 mg | ORAL_CAPSULE | Freq: Every day | ORAL | Status: DC

## 2015-05-15 MED ORDER — FUROSEMIDE 40 MG PO TABS: 40.0000 mg | ORAL_TABLET | Freq: Every day | ORAL | Status: DC

## 2015-05-15 NOTE — Progress Notes (Signed)
Subjective:    Patient ID: Tracy Rodriguez, female    DOB: March 08, 1932, 82 y.o.   MRN: [id number]  HPI 82  year-old patient who is seen today for an annual health assessment.  She has been widowed for about 4 years  and has relocated to [place]  from [facility].  She was hospitalized in May 2016 for new onset atrial fibrillation.  She has been on chronic anticoagulation since that time.  Her only complaint today is alert.  Extremities edema.  She has been off her furosemide for some time.  She states that she ran out of this medication.  Denies any cardiopulmonary complaints    Wt Readings from Last 3 Encounters:  02/06/15 193 lb (87.544 kg)  12/19/14 193 lb (87.544 kg)  11/25/14 190 lb (86.183 kg)   Past medical history  Remote breast cancer status post partial mastectomy 1981 Hysterectomy 1971 Cataract extraction surgery Chronic low back pain status post decompression 1972 1973, 1985  allergic rhinitis Bipolar disorder History of plantar fasciitis  Essential hypertension Atrial fibrillation Chronic anticoagulation Obesity   Allergies sulfa and codeine  Social history widowed retired worked with the [employer] 2 daughters one grandson Relocated from [place] Nonsmoker  Family history father died at 46 MI Mother died age 52 status post CVA history of breast cancer at 61 One brother died at 48 of leukemia One sister is well  1. Risk factors, based on past  M,S,F history-she has a history of essential hypertension as well as atrial fibrillation   2.  Physical activities: Chronic low back pain but no activity restrictions. Walks unassisted  3.  Depression/mood: History bipolar depression  4.  Hearing: No deficits  5.  ADL's: Requires assistance in aspects of daily living. Presently resides at a rest home  6.  Fall risk: Moderate due to unsteady gait and obesity  7.  Home safety: No problems identified  8.  Height weight, and visual  acuity; height and weight stable no change in visual acuity has had bilateral cataract extraction surgery  9.  Counseling: Heart healthy diet rectus eyes encouraged low-salt diet recommended  10. Lab orders based on risk factors: Will review of prior medical records  11. Referral : Not appropriate at this time  12. Care plan: Continue present regimen reviewed the past medical records  13. Cognitive assessment: Alert in order with normal affect. No cognitive dysfunction  14.  Preventive services will include an annual examination.  Annual mammograms.  Also encouraged.  Laboratory studies will also be reviewed annually. Patient was provided with a written and personalized care plan  15.   Provider list includes primary care ophthalmology and radiology.     Review of Systems  Constitutional: Negative for fever, appetite change, fatigue and unexpected weight change.  HENT: Negative for congestion, dental problem, ear pain, hearing loss, mouth sores, nosebleeds, sinus pressure, sore throat, tinnitus, trouble swallowing and voice change.   Eyes: Negative for photophobia, pain, redness and visual disturbance.  Respiratory: Negative for cough, chest tightness and shortness of breath.   Cardiovascular: Negative for chest pain, palpitations and leg swelling.  Gastrointestinal: Negative for nausea, vomiting, abdominal pain, diarrhea, constipation, blood in stool, abdominal distention and rectal pain.  Genitourinary: Negative for dysuria, urgency, frequency, hematuria, flank pain, vaginal bleeding, vaginal discharge, difficulty urinating, genital sores, vaginal pain, menstrual problem and pelvic pain.  Musculoskeletal: Positive for back pain, arthralgias and gait problem. Negative for neck stiffness.  Skin: Negative for rash.  Neurological:  Negative for dizziness, syncope, speech difficulty, weakness, light-headedness, numbness and headaches.  Hematological: Negative for adenopathy. Does not  bruise/bleed easily.  Psychiatric/Behavioral: Negative for suicidal ideas, behavioral problems, self-injury, dysphoric mood and agitation. The patient is not nervous/anxious.        Objective:   Physical Exam  Constitutional: She is oriented to person, place, and time. She appears well-developed and well-nourished.  HENT:  Head: Normocephalic and atraumatic.  Right Ear: External ear normal.  Left Ear: External ear normal.  Mouth/Throat: Oropharynx is clear and moist.  Low hanging soft palate Wax in both canals  Eyes: Conjunctivae and EOM are normal.  Neck: Normal range of motion. Neck supple. No JVD present. No thyromegaly present.  Cardiovascular: Normal rate, regular rhythm, normal heart sounds and intact distal pulses.   No murmur heard. Pedal pulses not easily palpable  Pulmonary/Chest: Effort normal and breath sounds normal. She has no wheezes. She has no rales.  Bilateral mastectomies with implants  Abdominal: Soft. Bowel sounds are normal. She exhibits no distension and no mass. There is no tenderness. There is no rebound and no guarding.  Musculoskeletal: Normal range of motion. She exhibits edema. She exhibits no tenderness.  Plus 2 lower extremity edema  Neurological: She is alert and oriented to person, place, and time. She has normal reflexes. No cranial nerve deficit. She exhibits normal muscle tone. Coordination normal.  Skin: Skin is warm and dry. No rash noted.  Psychiatric: She has a normal mood and affect. Her behavior is normal.          Assessment & Plan:  Preventive health exam Bipolar disorder Chronic atrial fibrillation  Essential hypertension  Chronic low back pain. Medications refilled Exogenous obesity.  Weight loss encouraged Chronic anticoagulation  Lower extremity edema.  Will resume furosemide  Recheck 6 months

## 2015-05-15 NOTE — Progress Notes (Signed)
Pre visit review using our clinic review tool, if applicable. No additional management support is needed unless otherwise documented below in the visit note. 

## 2015-05-15 NOTE — Addendum Note (Signed)
Addended by: Gari Crown D on: 05/15/2015 10:16 AM   Modules accepted: Orders

## 2015-05-15 NOTE — Patient Instructions (Addendum)
Limit your sodium (Salt) intake  Please check your blood pressure on a regular basis.  If it is consistently greater than 150/90, please make an office appointment.    It is important that you exercise regularly, at least 20 minutes 3 to 4 times per week.  If you develop chest pain or shortness of breath seek  medical attention.  You need to lose weight.  Consider a lower calorie diet and regular exercise.  Take a calcium supplement, plus [phone] units of vitamin D  Return in 3 months for follow-up

## 2015-05-18 ENCOUNTER — Telehealth: Payer: Self-pay | Admitting: Internal Medicine

## 2015-05-18 MED ORDER — TRAMADOL HCL 50 MG PO TABS: 50.0000 mg | ORAL_TABLET | Freq: Four times a day (QID) | ORAL | Status: DC

## 2015-05-18 NOTE — Telephone Encounter (Signed)
Pt has run out of  traMADol (ULTRAM) 50 MG tablet  Pt was seen 05/15/15  , all other meds were filled. Daughter states they called pharmacy yesterday, and is hoping to get today.  [facility] / [facility]

## 2015-05-18 NOTE — Telephone Encounter (Signed)
Beth notified Rx called into pharmacy for pt. Beth verbalized understanding.

## 2015-05-20 ENCOUNTER — Other Ambulatory Visit: Payer: Self-pay | Admitting: Internal Medicine

## 2015-06-05 ENCOUNTER — Ambulatory Visit (INDEPENDENT_AMBULATORY_CARE_PROVIDER_SITE_OTHER): Payer: Medicare Other | Admitting: Podiatry

## 2015-06-05 ENCOUNTER — Encounter: Payer: Self-pay | Admitting: Podiatry

## 2015-06-05 VITALS — BP 149/68 | HR 66 | Resp 16

## 2015-06-05 DIAGNOSIS — L600 Ingrowing nail: Secondary | ICD-10-CM

## 2015-06-07 NOTE — Progress Notes (Signed)
Subjective:     Patient ID: Christanna Hopewell, female   DOB: 20-Sep-1931, 82 y.o.   MRN: [id number]  HPI patient states that his left second nail is bothering me again and it's getting ingrown in the corner and I do think that I need more nail removed   Review of Systems     Objective:   Physical Exam Neurovascular status intact with incurvated left second nail medial border where I had taken nail loss but apparently I was not able to get enough of it off in order to reduce the discomfort    Assessment:     Ingrown toenail deformity left second toe medial border that requires more nail removal    Plan:     H&P conditions reviewed with patient and explained treatment. Patient wants this fixed and I recommended more nail removal and explained procedure and risk and she wants surgery and today I infiltrated the left second toe 60 mg like Marcaine mixture removed more nail second toe exposed the matrix and applied phenol 3 applications 30 seconds followed by alcohol lavage and sterile dressing. Gave instructions on soaks and reappoint

## 2015-06-12 ENCOUNTER — Telehealth: Payer: Self-pay | Admitting: *Deleted

## 2015-06-12 ENCOUNTER — Ambulatory Visit (INDEPENDENT_AMBULATORY_CARE_PROVIDER_SITE_OTHER): Payer: Medicare Other | Admitting: General Practice

## 2015-06-12 DIAGNOSIS — Z5181 Encounter for therapeutic drug level monitoring: Secondary | ICD-10-CM

## 2015-06-12 DIAGNOSIS — I4891 Unspecified atrial fibrillation: Secondary | ICD-10-CM | POA: Diagnosis not present

## 2015-06-12 LAB — POCT INR: INR: 1.7

## 2015-06-12 NOTE — Progress Notes (Signed)
Pre visit review using our clinic review tool, if applicable. No additional management support is needed unless otherwise documented below in the visit note. 

## 2015-06-12 NOTE — Telephone Encounter (Signed)
Left message for patient at [phone] (Home #) to check to see how they were doing from their ingrown toenail procedure that was performed on Monday, June 05, 2015. Waiting for a response.

## 2015-06-15 ENCOUNTER — Telehealth: Payer: Self-pay | Admitting: Internal Medicine

## 2015-06-15 ENCOUNTER — Ambulatory Visit (INDEPENDENT_AMBULATORY_CARE_PROVIDER_SITE_OTHER): Payer: Medicare Other | Admitting: Family Medicine

## 2015-06-15 ENCOUNTER — Ambulatory Visit: Payer: Medicare Other | Admitting: Family Medicine

## 2015-06-15 ENCOUNTER — Encounter: Payer: Self-pay | Admitting: Family Medicine

## 2015-06-15 VITALS — BP 180/100 | HR 82 | Temp 97.7°F | Wt 184.0 lb

## 2015-06-15 DIAGNOSIS — R1030 Lower abdominal pain, unspecified: Secondary | ICD-10-CM

## 2015-06-15 DIAGNOSIS — R109 Unspecified abdominal pain: Secondary | ICD-10-CM | POA: Diagnosis not present

## 2015-06-15 DIAGNOSIS — I10 Essential (primary) hypertension: Secondary | ICD-10-CM

## 2015-06-15 DIAGNOSIS — R3915 Urgency of urination: Secondary | ICD-10-CM

## 2015-06-15 LAB — POC URINALSYSI DIPSTICK (AUTOMATED)
Bilirubin, UA: NEGATIVE
Blood, UA: NEGATIVE
Glucose, UA: NEGATIVE
Ketones, UA: NEGATIVE
Leukocytes, UA: NEGATIVE
Nitrite, UA: NEGATIVE
Protein, UA: NEGATIVE
Spec Grav, UA: 1.02
Urobilinogen, UA: NEGATIVE
pH, UA: 6

## 2015-06-15 LAB — CBC WITH DIFFERENTIAL/PLATELET
Basophils Absolute: 0 10*3/uL (ref 0.0–0.1)
Basophils Relative: 0 % (ref 0.0–3.0)
Eosinophils Absolute: 0.1 10*3/uL (ref 0.0–0.7)
Eosinophils Relative: 1.1 % (ref 0.0–5.0)
HCT: 37.3 % (ref 36.0–46.0)
Hemoglobin: 12.1 g/dL (ref 12.0–15.0)
Lymphocytes Relative: 19.5 % (ref 12.0–46.0)
Lymphs Abs: 1.3 10*3/uL (ref 0.7–4.0)
MCHC: 32.5 g/dL (ref 30.0–36.0)
MCV: 83.7 fl (ref 78.0–100.0)
Monocytes Absolute: 0.6 10*3/uL (ref 0.1–1.0)
Monocytes Relative: 8.8 % (ref 3.0–12.0)
Neutro Abs: 4.6 10*3/uL (ref 1.4–7.7)
Neutrophils Relative %: 70.6 % (ref 43.0–77.0)
Platelets: 227 10*3/uL (ref 150.0–400.0)
RBC: 4.46 Mil/uL (ref 3.87–5.11)
RDW: 17.3 % — ABNORMAL HIGH (ref 11.5–15.5)
WBC: 6.6 10*3/uL (ref 4.0–10.5)

## 2015-06-15 MED ORDER — NITROFURANTOIN MONOHYD MACRO 100 MG PO CAPS: 100.0000 mg | ORAL_CAPSULE | Freq: Two times a day (BID) | ORAL | Status: DC

## 2015-06-15 NOTE — Progress Notes (Addendum)
Subjective:   Ms Zoila Wagenman is a 82 y.o. female who complains of hours of lower abdominal pain, which started in the middle of the night. She remembers having similar pain about a year ago. Last bowel movement today, "normal" Denies dysuria,urinary frequency, gross hematuria, cloudy or odorous urine. No nausea or vomiting Negative for vaginal bleeding or discharge + mild urinary urgency No Hx of UTI.  She has not tried OTC medication.  + lower abdominal pain, constant, dull, 4/10 but when started it was 10/10, no radiated. No sick contact or recent travel.    HTN: Blood pressure is elevated today, she doesn't check her blood pressure at home.  Currently she is on Diltiazem and Furosemide. Hx of atrial fibrillation and on Coumadin. She denies any severe/frequent headache, chest pain, exertional dyspnea, worsening LE edema, or focal weakness. It has been elevated during prior visits as well.   Review of Systems CONSTITUTIONAL: Negative for fever, chills, abnormal wt loss ,appetite changes,or fatigue.  CV: Denies chest pain, exertional dyspnea, or diaphoresis.  RESPIRATORY:Negative for cough, dyspnea, or wheezing.  ABDOMEN:Negative for nausea, vomiting,changes in bowel habits, or blood in stool.+ Lower abdominal pain.  [id number] gross hematuria, dysuria, or frequency. Positive: urinary urgency.  No vaginal bleeding or discharge.  [id number] for associated myalgias, arthralgias, joint edema, or back pain.  SKIN: Negative for erythematous rash or skin lesions/changes.  NEURO: Negative for headache, focal neurologic changes, or confusion.    Current Outpatient Prescriptions on File Prior to Visit  Medication Sig Dispense Refill  . atorvastatin (LIPITOR) 20 MG tablet Take 1 tablet (20 mg total) by mouth daily. 90 tablet 3  . diltiazem (DILACOR XR) 120 MG 24 hr capsule Take 1 capsule (120 mg total) by mouth daily. 90 capsule 3  . escitalopram (LEXAPRO) 10 MG  tablet Take 1 tablet (10 mg total) by mouth daily. 90 tablet 1  . furosemide (LASIX) 40 MG tablet Take 1 tablet (40 mg total) by mouth daily. 90 tablet 3  . gabapentin (NEURONTIN) 300 MG capsule Take 1 capsule (300 mg total) by mouth 2 (two) times daily. 180 capsule 3  . QUEtiapine (SEROQUEL) 25 MG tablet Take 1 tablet (25 mg total) by mouth 3 (three) times daily. 270 tablet 1  . traMADol (ULTRAM) 50 MG tablet Take 1 tablet (50 mg total) by mouth 4 (four) times daily. 120 tablet 2  . traZODone (DESYREL) 150 MG tablet TAKE 1 TABLET BY MOUTH EVERY NIGHT AT BEDTIME 90 tablet 0  . warfarin (COUMADIN) 5 MG tablet Take 1 tablet (5 mg total) by mouth daily. (Patient taking differently: Take 2.5-5 mg by mouth daily. Take 2.5mg  everyday except Sunday take 5mg .) 30 tablet 2   No current facility-administered medications on file prior to visit.     Past Medical History  Diagnosis Date  . Hypertension   . Bipolar 1 disorder ([place])   . Cancer of right breast ([place])   . Hyperlipemia   . Arthritis     "joints" (08/04/2014)  . Chronic lower back pain     Social History   Social History  . Marital Status: Widowed    Spouse Name: N/A  . Number of Children: N/A  . Years of Education: N/A   Social History Main Topics  . Smoking status: Never Smoker   . Smokeless tobacco: Never Used  . Alcohol Use: No  . Drug Use: No  . Sexual Activity: No   Other Topics Concern  . None  Social History Narrative       Objective:   Filed Vitals:   06/15/15 1140  BP: 180/100  Pulse: 82  Temp: 97.7 F (36.5 C)   Body mass index is 30.14 kg/(m^2).    GENERAL: Well developed,seems well hydrated and no in acute distress.  HEENT:Conjunctivae clear bilateral, moist oral mucosa, no oral lesions appreciated.  NECK: No lymphadenopathy or masses appreciated.  CV: Regular rhytth and rate today, ? Soft SEM RUSB. DP pulses 2+ bilateral.  RESPIRATORY: Clear to auscultation bilateral with no rhonchi, rales,  or wheezing.  ABDOMEN:Soft,no masses or megaly appreciated. No CVA tenderness bilateral. Mild tenderness upon palpation of suprapubic area, no guarded or rebound present. BS mildly hyperactive.  EXTREMITIES: 1+ pitting LE edema bilateral.  NEURO: Oriented and alert, no focal deficit appreciated, normal gait.  PSYCH: Oriented, appropriate mood and affect.   Laboratory:  Urine dip in house otherwise negative.    Assessment and plan:   Abdominal pain, lower   Possible causes discussed. Instructed about warning signs. For now I will treat and cystitis/UTI, even thought urine dip is negative.  - Plan: CBC With Differential, Culture, Urine, nitrofurantoin, macrocrystal-monohydrate, (MACROBID) 100 MG capsule  Urinary urgency  Possible causes of urine urgency discussed.? Mild cystitis. I am not convened it is a UTI but she just started with symptom, more urinary symptom may present today or tomorrow. Because allergic to Sulfas and Hx of atrial fib + on Seroquel and Trazodone, I chose Macrobid on Cipro. Ucx will be ordered. Abx treatment will be tailor according to Ucx results and susceptibility report. Clearly instructed about warning signs.   - Plan: POCT Urinalysis Dipstick (Automated), nitrofurantoin, macrocrystal-monohydrate, (MACROBID) 100 MG capsule  Essential hypertension  BP rechecked 160/90. She was instructed to check BP at home. Her BP has been elevated during prior OV's. No changes today.       Betty G. Martinique, MD  [facility]. [place] office.

## 2015-06-15 NOTE — Patient Instructions (Signed)
We discussed: ER evaluation if any of warning signs discussed. Vit C or cranberry pills/jiuce. Adequate hydration. Monitor for new symptoms. Follow with your doctor in a week, before if needed.  BP re-checked 160/90, elevated. Blood pressure goal < 140/90. No changes today, please check blood pressure at home.

## 2015-06-15 NOTE — Telephone Encounter (Signed)
Spoke to pt, told her I do not have that she is on Lidocaine patches or that Dr.K had ordered them, who ordered them? Pt said it has been a long time it was when she was in [place]. Asked pt if taking Tramadol for pain. Pt said yes and that helps. Told her okay.

## 2015-06-15 NOTE — Progress Notes (Signed)
Pre visit review using our clinic review tool, if applicable. No additional management support is needed unless otherwise documented below in the visit note. 

## 2015-06-15 NOTE — Telephone Encounter (Signed)
Pt needs new rx lidocaine patches 5% for bursitis

## 2015-06-16 ENCOUNTER — Encounter: Payer: Self-pay | Admitting: Family Medicine

## 2015-06-17 LAB — URINE CULTURE: Colony Count: 10000

## 2015-06-19 ENCOUNTER — Encounter: Payer: Self-pay | Admitting: Family Medicine

## 2015-06-19 MED ORDER — LIDOCAINE 5 % EX PTCH: 1.0000 | MEDICATED_PATCH | CUTANEOUS | Status: DC

## 2015-06-19 NOTE — Telephone Encounter (Signed)
Spoke to pt, told her Dr.K said he would order Lidocaine patches for you. Rx sent to pharmacy. Pt verbalized understanding.

## 2015-06-19 NOTE — Telephone Encounter (Signed)
Dr.K, pt would like a Rx for Lidocaine patches 5 % for her Bursitis. Okay to order?

## 2015-06-19 NOTE — Telephone Encounter (Signed)
ok 

## 2015-06-21 ENCOUNTER — Telehealth: Payer: Self-pay | Admitting: General Practice

## 2015-06-21 NOTE — Telephone Encounter (Signed)
Received a PA request from [place] in regards to patients medication. Called Patient to find out who her prescription benefits are through to that PA can be started in [facility]). Patient advised that she would have to call back later to give me that information. PA cannot be initiated without the RX Bin # and the pt ID and the name of prescription benefits company.

## 2015-06-27 ENCOUNTER — Telehealth: Payer: Self-pay | Admitting: Internal Medicine

## 2015-06-27 NOTE — Telephone Encounter (Signed)
Tracy Rodriguez, can you see if the PA was ever done for pt's Lidocaine patches. Thanks

## 2015-06-27 NOTE — Telephone Encounter (Signed)
Left message on voicemail to call office.  

## 2015-06-27 NOTE — Telephone Encounter (Signed)
PA submitted, please allow 3-5 business days for response from insurance.

## 2015-06-27 NOTE — Telephone Encounter (Signed)
Pt need refill on Rx of Lidocaine patches.  Pharm:  [facility].

## 2015-06-29 NOTE — Telephone Encounter (Signed)
Spoke to pt, told her PA was submitted for Lidocaine patches and we are just waiting to hear back from insurance whether approved or not. Pt verbalized understanding and said she thinks she needs to be seen. Pt c/o getting up every hour at night to urinate. Asked pt if having any burning or pain with urination? Pt said no. Appt scheduled for Monday at 2:30 with Dr.K. Pt verbalized understanding.

## 2015-07-03 ENCOUNTER — Ambulatory Visit (INDEPENDENT_AMBULATORY_CARE_PROVIDER_SITE_OTHER): Payer: Medicare Other | Admitting: Internal Medicine

## 2015-07-03 ENCOUNTER — Encounter: Payer: Self-pay | Admitting: Internal Medicine

## 2015-07-03 ENCOUNTER — Telehealth: Payer: Self-pay | Admitting: *Deleted

## 2015-07-03 VITALS — BP 160/80 | HR 68 | Temp 98.3°F | Resp 20 | Ht 65.5 in | Wt 183.0 lb

## 2015-07-03 DIAGNOSIS — R351 Nocturia: Secondary | ICD-10-CM

## 2015-07-03 DIAGNOSIS — I10 Essential (primary) hypertension: Secondary | ICD-10-CM | POA: Diagnosis not present

## 2015-07-03 DIAGNOSIS — I4891 Unspecified atrial fibrillation: Secondary | ICD-10-CM | POA: Diagnosis not present

## 2015-07-03 LAB — POC URINALSYSI DIPSTICK (AUTOMATED)
Bilirubin, UA: NEGATIVE
Glucose, UA: NEGATIVE
Ketones, UA: NEGATIVE
Nitrite, UA: NEGATIVE
Protein, UA: NEGATIVE
Spec Grav, UA: 1.015
Urobilinogen, UA: 1
pH, UA: 6

## 2015-07-03 MED ORDER — MIRABEGRON ER 25 MG PO TB24: 25.0000 mg | ORAL_TABLET | Freq: Every day | ORAL | Status: DC

## 2015-07-03 NOTE — Progress Notes (Signed)
Pre visit review using our clinic review tool, if applicable. No additional management support is needed unless otherwise documented below in the visit note. 

## 2015-07-03 NOTE — Progress Notes (Signed)
Subjective:    Patient ID: Tracy Rodriguez, female    DOB: 1931/11/16, 83 y.o.   MRN: [id number]  HPI 82 year old patient who was seen 3 weeks ago with urinary frequency.  She also has some associated abdominal pain and was treated with antibiotic therapy.  Urine culture revealed insignificant growth of Escherichia coli.  She seemed to improve but over the past 2 weeks has noted frequent nocturia.  She states that when she first retires for the evening.  She has nocturia every hour initially and then things settle down the last half of her sleep.  Denies any daytime symptoms.  No dysuria She's had a remote history of a bladder tack performed in [place].  This apparently was performed due to a cystocele associated with pain  Past Medical History  Diagnosis Date  . Hypertension   . Bipolar 1 disorder ([place])   . Cancer of right breast ([place])   . Hyperlipemia   . Arthritis     "joints" (08/04/2014)  . Chronic lower back pain      Social History   Social History  . Marital Status: Widowed    Spouse Name: N/A  . Number of Children: N/A  . Years of Education: N/A   Occupational History  . Not on file.   Social History Main Topics  . Smoking status: Never Smoker   . Smokeless tobacco: Never Used  . Alcohol Use: No  . Drug Use: No  . Sexual Activity: No   Other Topics Concern  . Not on file   Social History Narrative    Past Surgical History  Procedure Laterality Date  . Cataract extraction w/ intraocular lens  implant, bilateral Bilateral   . Tonsillectomy    . Excisional hemorrhoidectomy    . Breast biopsy Right   . Mastectomy Right     cancer  . Breast reconstruction Right   . Placement of breast implants Bilateral     "had to take tissure out of left"  . Lumbar disc surgery  1972; 1973; 1985    "ruptured discs each time"  . Back surgery    . Vaginal hysterectomy      Family History  Problem Relation Age of Onset  . Stroke Mother   . Heart disease Father   .  Alzheimer's disease Sister   . Leukemia Brother   . Arthritis Maternal Grandmother     Allergies  Allergen Reactions  . Codeine Anaphylaxis  . Sulfa Antibiotics Rash    Current Outpatient Prescriptions on File Prior to Visit  Medication Sig Dispense Refill  . atorvastatin (LIPITOR) 20 MG tablet Take 1 tablet (20 mg total) by mouth daily. 90 tablet 3  . diltiazem (DILACOR XR) 120 MG 24 hr capsule Take 1 capsule (120 mg total) by mouth daily. 90 capsule 3  . escitalopram (LEXAPRO) 10 MG tablet Take 1 tablet (10 mg total) by mouth daily. 90 tablet 1  . furosemide (LASIX) 40 MG tablet Take 1 tablet (40 mg total) by mouth daily. 90 tablet 3  . gabapentin (NEURONTIN) 300 MG capsule Take 1 capsule (300 mg total) by mouth 2 (two) times daily. 180 capsule 3  . QUEtiapine (SEROQUEL) 25 MG tablet Take 1 tablet (25 mg total) by mouth 3 (three) times daily. 270 tablet 1  . traMADol (ULTRAM) 50 MG tablet Take 1 tablet (50 mg total) by mouth 4 (four) times daily. 120 tablet 2  . traZODone (DESYREL) 150 MG tablet TAKE 1 TABLET BY MOUTH EVERY  NIGHT AT BEDTIME 90 tablet 0  . warfarin (COUMADIN) 5 MG tablet Take 1 tablet (5 mg total) by mouth daily. (Patient taking differently: Take 2.5-5 mg by mouth daily. Take 2.5mg  everyday except Sunday take 5mg .) 30 tablet 2  . lidocaine (LIDODERM) 5 % Place 1 patch onto the skin daily. Remove & Discard patch within 12 hours or as directed by MD (Patient not taking: Reported on 07/03/2015) 30 patch 2   No current facility-administered medications on file prior to visit.    BP 160/80 mmHg  Pulse 68  Temp(Src) 98.3 F (36.8 C) (Oral)  Resp 20  Ht 5' 5.5" (1.664 m)  Wt 183 lb (83.008 kg)  BMI 29.98 kg/m2  SpO2 97%  LMP 10/10/1969      Review of Systems  Constitutional: Negative.   HENT: Negative for congestion, dental problem, hearing loss, rhinorrhea, sinus pressure, sore throat and tinnitus.   Eyes: Negative for pain, discharge and visual disturbance.    Respiratory: Negative for cough and shortness of breath.   Cardiovascular: Negative for chest pain, palpitations and leg swelling.  Gastrointestinal: Negative for nausea, vomiting, abdominal pain, diarrhea, constipation, blood in stool and abdominal distention.  Genitourinary: Positive for frequency. Negative for dysuria, urgency, hematuria, flank pain, vaginal bleeding, vaginal discharge, difficulty urinating, vaginal pain and pelvic pain.  Musculoskeletal: Negative for joint swelling, arthralgias and gait problem.  Skin: Negative for rash.  Neurological: Negative for dizziness, syncope, speech difficulty, weakness, numbness and headaches.  Hematological: Negative for adenopathy.  Psychiatric/Behavioral: Negative for behavioral problems, dysphoric mood and agitation. The patient is not nervous/anxious.        Objective:   Physical Exam  Constitutional: She is oriented to person, place, and time. She appears well-developed and well-nourished. No distress.  Repeat blood pressure 135/65  HENT:  Head: Normocephalic.  Right Ear: External ear normal.  Left Ear: External ear normal.  Mouth/Throat: Oropharynx is clear and moist.  Eyes: Conjunctivae and EOM are normal. Pupils are equal, round, and reactive to light.  Neck: Normal range of motion. Neck supple. No thyromegaly present.  Cardiovascular: Normal rate, regular rhythm, normal heart sounds and intact distal pulses.   Pulmonary/Chest: Effort normal and breath sounds normal.  Abdominal: Soft. Bowel sounds are normal. She exhibits no distension and no mass. There is no tenderness. There is no rebound and no guarding.  Musculoskeletal: Normal range of motion.  Lymphadenopathy:    She has no cervical adenopathy.  Neurological: She is alert and oriented to person, place, and time.  Skin: Skin is warm and dry. No rash noted.  Psychiatric: She has a normal mood and affect. Her behavior is normal.          Assessment & Plan:    Nocturia.  This is present only the first half of her rest.  And then seems to resolve.  No daytime symptoms.  Urinalysis revealed only trace pyuria. We'll will treat as OAB and observe symptoms.  Will consider treatment if these measures are not helpful Essential hypertension, stable.  Repeat blood pressure 135/65

## 2015-07-03 NOTE — Patient Instructions (Signed)
Overactive Bladder, Adult Overactive bladder is a group of urinary symptoms. With overactive bladder, you may suddenly feel the need to pass urine (urinate) right away. After feeling this sudden urge, you might also leak urine if you cannot get to the bathroom fast enough (urinary incontinence). These symptoms might interfere with your daily work or social activities. Overactive bladder symptoms may also wake you up at night. Overactive bladder affects the nerve signals between your bladder and your brain. Your bladder may get the signal to empty before it is full. Very sensitive muscles can also make your bladder squeeze too soon. CAUSES Many things can cause an overactive bladder. Possible causes include:  Urinary tract infection.  Infection of nearby tissues, such as the prostate.  Prostate enlargement.  Being pregnant with twins or more (multiples).  Surgery on the uterus or urethra.  Bladder stones, inflammation, or tumors.  Drinking too much caffeine or alcohol.  Certain medicines, especially those that you take to help your body get rid of extra fluid (diuretics) by increasing urine production.  Muscle or nerve weakness, especially from:  A spinal cord injury.  Stroke.  Multiple sclerosis.  Parkinson disease.  Diabetes. This can cause a high urine volume that fills the bladder so quickly that the normal urge to urinate is triggered very strongly.  Constipation. A buildup of too much stool can put pressure on your bladder. RISK FACTORS You may be at greater risk for overactive bladder if you:  Are an older adult.  Smoke.  Are going through menopause.  Have prostate problems.  Have a neurological disease, such as stroke, dementia, Parkinson disease, or multiple sclerosis (MS).  Eat or drink things that irritate the bladder. These include alcohol, spicy food, and caffeine.  Are overweight or obese. SIGNS AND SYMPTOMS  The signs and symptoms of an overactive  bladder include:  Sudden, strong urges to urinate.  Leaking urine.  Urinating eight or more times per day.  Waking up to urinate two or more times per night. DIAGNOSIS Your health care provider may suspect overactive bladder based on your symptoms. The health care provider will do a physical exam and take your medical history. Blood or urine tests may also be done. For example, you might need to have a bladder function test to check how well you can hold your urine. You might also need to see a health care provider who specializes in the urinary tract (urologist). TREATMENT Treatment for overactive bladder depends on the cause of your condition and whether it is mild or severe. Certain treatments can be done in your health care provider's office or clinic. You can also make lifestyle changes at home. Options include: Behavioral Treatments  Biofeedback. A specialist uses sensors to help you become aware of your body's signals.  Keeping a daily log of when you need to urinate and what happens after the urge. This may help you manage your condition.  Bladder training. This helps you learn to control the urge to urinate by following a schedule that directs you to urinate at regular intervals (timed voiding). At first, you might have to wait a few minutes after feeling the urge. In time, you should be able to schedule bathroom visits an hour or more apart.  Kegel exercises. These are exercises to strengthen the pelvic floor muscles, which support the bladder. Toning these muscles can help you control urination, even if your bladder muscles are overactive. A specialist will teach you how to do these exercises correctly. They   require daily practice.  Weight loss. If you are obese or overweight, losing weight might relieve your symptoms of overactive bladder. Talk to your health care provider about losing weight and whether there is a specific program or method that would work best for you.  Diet  change. This might help if constipation is making your overactive bladder worse. Your health care provider or a dietitian can explain ways to change what you eat to ease constipation. You might also need to consume less alcohol and caffeine or drink other fluids at different times of the day.  Stopping smoking.  Wearing pads to absorb leakage while you wait for other treatments to take effect. Physical Treatments  Electrical stimulation. Electrodes send gentle pulses of electricity to strengthen the nerves or muscles that help to control the bladder. Sometimes, the electrodes are placed outside of the body. In other cases, they might be placed inside the body (implanted). This treatment can take several months to have an effect.  Supportive devices. Women may need a plastic device that fits into the vagina and supports the bladder (pessary). Medicines Several medicines can help treat overactive bladder and are usually used along with other treatments. Some are injected into the muscles involved in urination. Others come in pill form. Your health care provider may prescribe:  Antispasmodics. These medicines block the signals that the nerves send to the bladder. This keeps the bladder from releasing urine at the wrong time.  Tricyclic antidepressants. These types of antidepressants also relax bladder muscles. Surgery  You may have a device implanted to help manage the nerve signals that indicate when you need to urinate.  You may have surgery to implant electrodes for electrical stimulation.  Sometimes, very severe cases of overactive bladder require surgery to change the shape of the bladder. HOME CARE INSTRUCTIONS   Take medicines only as directed by your health care provider.  Use any implants or a pessary as directed by your health care provider.  Make any diet or lifestyle changes that are recommended by your health care provider. These might include:  Drinking less fluid or  drinking at different times of the day. If you need to urinate often during the night, you may need to stop drinking fluids early in the evening.  Cutting down on caffeine or alcohol. Both can make an overactive bladder worse. Caffeine is found in coffee, tea, and sodas.  Doing Kegel exercises to strengthen muscles.  Losing weight if you need to.  Eating a healthy and balanced diet to prevent constipation.  Keep a journal or log to track how much and when you drink and also when you feel the need to urinate. This will help your health care provider to monitor your condition. SEEK MEDICAL CARE IF:  Your symptoms do not get better after treatment.  Your pain and discomfort are getting worse.  You have more frequent urges to urinate.  You have a fever. SEEK IMMEDIATE MEDICAL CARE IF: You are not able to control your bladder at all.   This information is not intended to replace advice given to you by your health care provider. Make sure you discuss any questions you have with your health care provider.   Document Released: 12/22/2008 Document Revised: 03/18/2014 Document Reviewed: 07/21/2013 Elsevier Interactive Patient Education 2016 [employer] Inc[employer]  

## 2015-07-03 NOTE — Telephone Encounter (Signed)
Prior Authorization request for Lidocaine 5% topical patch has been denied.  Reason for denial: this medication is not medically accepted use for Bursitis of shoulder.

## 2015-07-09 ENCOUNTER — Other Ambulatory Visit: Payer: Self-pay | Admitting: Internal Medicine

## 2015-07-10 ENCOUNTER — Ambulatory Visit: Payer: Medicare Other

## 2015-07-13 ENCOUNTER — Telehealth: Payer: Self-pay | Admitting: Internal Medicine

## 2015-07-13 NOTE — Telephone Encounter (Signed)
Need to know the Dx for the lidocaine patches. (PA stated that it is for neuropathic need clarification).  May call back and give verbal or lmom.

## 2015-07-13 NOTE — Telephone Encounter (Signed)
Left detailed message on personal voicemail of Pam, that pt is using Lidocaine patches for Bursitis in her hips. Any questions please call office.

## 2015-07-17 ENCOUNTER — Other Ambulatory Visit: Payer: Self-pay | Admitting: Internal Medicine

## 2015-07-17 NOTE — Telephone Encounter (Signed)
Pam with [facility] would like a call back to discuss the Dx for pt needing the lidocaine patches.  Pls call and [phone]

## 2015-07-18 NOTE — Telephone Encounter (Signed)
Discontinue Lidoderm There are no alternative medications

## 2015-07-18 NOTE — Telephone Encounter (Signed)
Please see message and advise if has any of the reasons listed and if not what medication do you want to order?

## 2015-07-18 NOTE — Telephone Encounter (Addendum)
Tracy Rodriguez with [facility] states the only way the lidocaine (LIDODERM) 5 % patches will be approved is if pt has any of these 3: Post herpetic neuralgia Diabetic peripheiarl neuralgia Neuropathic cancer pain  Also, pt must have tried and failed lyrica or dulextine if any other pain associated with above.  Please contact by end of day or case will be closed

## 2015-07-19 ENCOUNTER — Telehealth: Payer: Self-pay | Admitting: Internal Medicine

## 2015-07-19 NOTE — Telephone Encounter (Signed)
Spoke to pt, told her PA was denied for Lidocaine Patch and Dr.K said to discontinue it and that there are no alternatives for the patch. Pt verbalized understanding. Asked pt is she has something else to take for pain? Pt said yes, she is taking Tramadol and it is helping. Told her okay.

## 2015-07-19 NOTE — Telephone Encounter (Signed)
Pt aware PA denied and Dr.K discontinued medication.

## 2015-07-19 NOTE — Telephone Encounter (Signed)
Tracy Rodriguez from [facility] call to say that the diag is not approval and is asking for other diagno Would like a call back . She said this is the last day for approval on this .    [phone]

## 2015-07-21 ENCOUNTER — Telehealth: Payer: Self-pay | Admitting: Internal Medicine

## 2015-07-21 DIAGNOSIS — M545 Low back pain, unspecified: Secondary | ICD-10-CM

## 2015-07-21 NOTE — Telephone Encounter (Signed)
Spoke to pt, told her order for referral to Ortho was done and someone will be contacting you to schedule an appt. Pt verbalized understanding.

## 2015-07-21 NOTE — Telephone Encounter (Signed)
Okay 

## 2015-07-21 NOTE — Telephone Encounter (Signed)
Okay to send referral to Ortho?

## 2015-07-21 NOTE — Telephone Encounter (Signed)
Pt is having lower back pain and would like referral to ortho . Pt has medicare

## 2015-07-26 DIAGNOSIS — M25552 Pain in left hip: Secondary | ICD-10-CM | POA: Diagnosis not present

## 2015-07-26 DIAGNOSIS — M545 Low back pain: Secondary | ICD-10-CM | POA: Diagnosis not present

## 2015-07-27 ENCOUNTER — Telehealth: Payer: Self-pay | Admitting: Internal Medicine

## 2015-07-27 NOTE — Telephone Encounter (Signed)
Medication was discontinued and pt is aware.

## 2015-07-27 NOTE — Telephone Encounter (Signed)
Appeal for lidocaine patched is denied because dx provided are not medically accepted indications.

## 2015-08-10 ENCOUNTER — Other Ambulatory Visit: Payer: Self-pay | Admitting: Internal Medicine

## 2015-08-12 ENCOUNTER — Encounter (HOSPITAL_COMMUNITY): Payer: Self-pay | Admitting: Emergency Medicine

## 2015-08-12 ENCOUNTER — Emergency Department (HOSPITAL_COMMUNITY)
Admission: EM | Admit: 2015-08-12 | Discharge: 2015-08-13 | Disposition: A | Discharge status: Home / Self Care | Payer: Medicare Other | Attending: Emergency Medicine | Admitting: Emergency Medicine

## 2015-08-12 DIAGNOSIS — S3991XA Unspecified injury of abdomen, initial encounter: Secondary | ICD-10-CM | POA: Insufficient documentation

## 2015-08-12 DIAGNOSIS — I10 Essential (primary) hypertension: Secondary | ICD-10-CM | POA: Insufficient documentation

## 2015-08-12 DIAGNOSIS — D649 Anemia, unspecified: Secondary | ICD-10-CM | POA: Insufficient documentation

## 2015-08-12 DIAGNOSIS — Z79899 Other long term (current) drug therapy: Secondary | ICD-10-CM | POA: Diagnosis not present

## 2015-08-12 DIAGNOSIS — Y9289 Other specified places as the place of occurrence of the external cause: Secondary | ICD-10-CM | POA: Insufficient documentation

## 2015-08-12 DIAGNOSIS — W182XXA Fall in (into) shower or empty bathtub, initial encounter: Secondary | ICD-10-CM | POA: Insufficient documentation

## 2015-08-12 DIAGNOSIS — Y9389 Activity, other specified: Secondary | ICD-10-CM | POA: Diagnosis not present

## 2015-08-12 DIAGNOSIS — Z853 Personal history of malignant neoplasm of breast: Secondary | ICD-10-CM | POA: Insufficient documentation

## 2015-08-12 DIAGNOSIS — Z9889 Other specified postprocedural states: Secondary | ICD-10-CM | POA: Diagnosis not present

## 2015-08-12 DIAGNOSIS — S3993XA Unspecified injury of pelvis, initial encounter: Secondary | ICD-10-CM | POA: Diagnosis not present

## 2015-08-12 DIAGNOSIS — E785 Hyperlipidemia, unspecified: Secondary | ICD-10-CM | POA: Insufficient documentation

## 2015-08-12 DIAGNOSIS — Y998 Other external cause status: Secondary | ICD-10-CM | POA: Diagnosis not present

## 2015-08-12 DIAGNOSIS — R109 Unspecified abdominal pain: Secondary | ICD-10-CM

## 2015-08-12 DIAGNOSIS — M5126 Other intervertebral disc displacement, lumbar region: Secondary | ICD-10-CM | POA: Diagnosis not present

## 2015-08-12 DIAGNOSIS — S29001A Unspecified injury of muscle and tendon of front wall of thorax, initial encounter: Secondary | ICD-10-CM | POA: Insufficient documentation

## 2015-08-12 DIAGNOSIS — M5441 Lumbago with sciatica, right side: Secondary | ICD-10-CM | POA: Diagnosis not present

## 2015-08-12 DIAGNOSIS — R1030 Lower abdominal pain, unspecified: Secondary | ICD-10-CM | POA: Diagnosis not present

## 2015-08-12 DIAGNOSIS — R791 Abnormal coagulation profile: Secondary | ICD-10-CM | POA: Diagnosis not present

## 2015-08-12 DIAGNOSIS — S3992XA Unspecified injury of lower back, initial encounter: Secondary | ICD-10-CM | POA: Diagnosis present

## 2015-08-12 DIAGNOSIS — D6489 Other specified anemias: Secondary | ICD-10-CM | POA: Diagnosis not present

## 2015-08-12 DIAGNOSIS — J449 Chronic obstructive pulmonary disease, unspecified: Secondary | ICD-10-CM | POA: Diagnosis not present

## 2015-08-12 DIAGNOSIS — M546 Pain in thoracic spine: Secondary | ICD-10-CM | POA: Diagnosis not present

## 2015-08-12 DIAGNOSIS — G8929 Other chronic pain: Secondary | ICD-10-CM | POA: Insufficient documentation

## 2015-08-12 DIAGNOSIS — Z7901 Long term (current) use of anticoagulants: Secondary | ICD-10-CM | POA: Insufficient documentation

## 2015-08-12 DIAGNOSIS — F319 Bipolar disorder, unspecified: Secondary | ICD-10-CM | POA: Insufficient documentation

## 2015-08-12 NOTE — ED Notes (Signed)
Pt to the ED via [facility] with c/o lower back pain,  St's it has been hurting for 3 days,  EMS st's pt reported to them that she is out of her pain meds.  Pt st's she has appt with her MD on Monday

## 2015-08-13 ENCOUNTER — Emergency Department (HOSPITAL_COMMUNITY): Payer: Medicare Other

## 2015-08-13 DIAGNOSIS — M5126 Other intervertebral disc displacement, lumbar region: Secondary | ICD-10-CM | POA: Diagnosis not present

## 2015-08-13 DIAGNOSIS — R1030 Lower abdominal pain, unspecified: Secondary | ICD-10-CM | POA: Diagnosis not present

## 2015-08-13 DIAGNOSIS — J449 Chronic obstructive pulmonary disease, unspecified: Secondary | ICD-10-CM | POA: Diagnosis not present

## 2015-08-13 DIAGNOSIS — M5441 Lumbago with sciatica, right side: Secondary | ICD-10-CM | POA: Diagnosis not present

## 2015-08-13 LAB — PROTIME-INR
INR: 3.82 — ABNORMAL HIGH (ref 0.00–1.49)
Prothrombin Time: 36.7 seconds — ABNORMAL HIGH (ref 11.6–15.2)

## 2015-08-13 LAB — COMPREHENSIVE METABOLIC PANEL
ALT: 13 U/L — ABNORMAL LOW (ref 14–54)
AST: 17 U/L (ref 15–41)
Albumin: 2.9 g/dL — ABNORMAL LOW (ref 3.5–5.0)
Alkaline Phosphatase: 56 U/L (ref 38–126)
Anion gap: 10 (ref 5–15)
BUN: 43 mg/dL — ABNORMAL HIGH (ref 6–20)
CO2: 23 mmol/L (ref 22–32)
Calcium: 8.1 mg/dL — ABNORMAL LOW (ref 8.9–10.3)
Chloride: 101 mmol/L (ref 101–111)
Creatinine, Ser: 1.46 mg/dL — ABNORMAL HIGH (ref 0.44–1.00)
GFR calc Af Amer: 37 mL/min — ABNORMAL LOW (ref >60)
GFR calc non Af Amer: 32 mL/min — ABNORMAL LOW (ref >60)
Glucose, Bld: 115 mg/dL — ABNORMAL HIGH (ref 65–99)
Potassium: 3.9 mmol/L (ref 3.5–5.1)
Sodium: 134 mmol/L — ABNORMAL LOW (ref 135–145)
Total Bilirubin: 0.5 mg/dL (ref 0.3–1.2)
Total Protein: 5.3 g/dL — ABNORMAL LOW (ref 6.5–8.1)

## 2015-08-13 LAB — CBC WITH DIFFERENTIAL/PLATELET
Basophils Absolute: 0 10*3/uL (ref 0.0–0.1)
Basophils Relative: 0 %
Eosinophils Absolute: 0.1 10*3/uL (ref 0.0–0.7)
Eosinophils Relative: 1 %
HCT: 29.8 % — ABNORMAL LOW (ref 36.0–46.0)
Hemoglobin: 9.3 g/dL — ABNORMAL LOW (ref 12.0–15.0)
Lymphocytes Relative: 9 %
Lymphs Abs: 1.1 10*3/uL (ref 0.7–4.0)
MCH: 26.6 pg (ref 26.0–34.0)
MCHC: 31.2 g/dL (ref 30.0–36.0)
MCV: 85.4 fL (ref 78.0–100.0)
Monocytes Absolute: 0.6 10*3/uL (ref 0.1–1.0)
Monocytes Relative: 5 %
Neutro Abs: 10.9 10*3/uL — ABNORMAL HIGH (ref 1.7–7.7)
Neutrophils Relative %: 85 %
Platelets: 188 10*3/uL (ref 150–400)
RBC: 3.49 MIL/uL — ABNORMAL LOW (ref 3.87–5.11)
RDW: 16.5 % — ABNORMAL HIGH (ref 11.5–15.5)
WBC: 12.6 10*3/uL — ABNORMAL HIGH (ref 4.0–10.5)

## 2015-08-13 LAB — URINALYSIS, ROUTINE W REFLEX MICROSCOPIC
Bilirubin Urine: NEGATIVE
Glucose, UA: NEGATIVE mg/dL
Hgb urine dipstick: NEGATIVE
Ketones, ur: NEGATIVE mg/dL
Nitrite: NEGATIVE
Protein, ur: NEGATIVE mg/dL
Specific Gravity, Urine: 1.015 (ref 1.005–1.030)
pH: 5.5 (ref 5.0–8.0)

## 2015-08-13 LAB — POC OCCULT BLOOD, ED: Fecal Occult Bld: NEGATIVE

## 2015-08-13 LAB — URINE MICROSCOPIC-ADD ON

## 2015-08-13 MED ORDER — MORPHINE SULFATE (PF) 4 MG/ML IV SOLN
4.0000 mg | Freq: Once | INTRAVENOUS | Status: AC
  Administered 2015-08-13: 4 mg via INTRAVENOUS
  Filled 2015-08-13: qty 1

## 2015-08-13 MED ORDER — HYDROCODONE-ACETAMINOPHEN 5-325 MG PO TABS: 1.0000 | ORAL_TABLET | Freq: Four times a day (QID) | ORAL | Status: DC | PRN

## 2015-08-13 MED ORDER — SODIUM CHLORIDE 0.9 % IV BOLUS (SEPSIS)
1000.0000 mL | Freq: Once | INTRAVENOUS | Status: AC
  Administered 2015-08-13: 1000 mL via INTRAVENOUS

## 2015-08-13 NOTE — Discharge Instructions (Signed)
Take vicodin as needed for pain.  Hold coumadin for 2 days then restart. Recheck INR in 5 days.   See your doctor.   You are anemic and will need repeat blood count with the INR check.  See spine doctor for your back  Return to ER if you have severe back pain, trouble walking, numbness, trouble urinating

## 2015-08-13 NOTE — ED Notes (Signed)
EDP at bedside  

## 2015-08-13 NOTE — ED Provider Notes (Signed)
CSN: [id number]     Arrival date & time 08/12/15  2330 History  By signing my name below, I, Rowan Blase, attest that this documentation has been prepared under the direction and in the presence of Wandra Arthurs, MD . Electronically Signed: Rowan Blase, Scribe. 08/13/2015. 12:12 AM.    Chief Complaint  Patient presents with  . Back Pain    The history is provided by the patient and a relative. No language interpreter was used.   HPI Comments:  Tracy Rodriguez is a 82 y.o. female with PMHx of back surgery who presents to the Emergency Department via EMS complaining of lower back pain for the past few months. Pt states her pain radiates into her vagina. Pt ran out of Tramadol after taking 2 this morning; she states it had not been providing relief. She has been taking more pain medication recently due to increased pain. Family reports pt fell landing on her bottom in the bathtub  About 1 week ago. Daughter states pt has been complaining of rib pain since the fall; pt states her ribs hurt in the back. Denies head injury with the fall. Pt has a doctors appointment Monday. She takes Coumadin for A fib. Denies syncope, dysuria, abdominal pain, or chest pain.   Past Medical History  Diagnosis Date  . Hypertension   . Bipolar 1 disorder ([place])   . Cancer of right breast ([place])   . Hyperlipemia   . Arthritis     "joints" (08/04/2014)  . Chronic lower back pain    Past Surgical History  Procedure Laterality Date  . Cataract extraction w/ intraocular lens  implant, bilateral Bilateral   . Tonsillectomy    . Excisional hemorrhoidectomy    . Breast biopsy Right   . Mastectomy Right     cancer  . Breast reconstruction Right   . Placement of breast implants Bilateral     "had to take tissure out of left"  . Lumbar disc surgery  1972; 1973; 1985    "ruptured discs each time"  . Back surgery    . Vaginal hysterectomy     Family History  Problem Relation Age of Onset  . Stroke Mother   .  Heart disease Father   . Alzheimer's disease Sister   . Leukemia Brother   . Arthritis Maternal Grandmother    Social History  Substance Use Topics  . Smoking status: Never Smoker   . Smokeless tobacco: Never Used  . Alcohol Use: No   OB History    No data available     Review of Systems  Genitourinary: Positive for flank pain and vaginal pain.  Musculoskeletal: Positive for arthralgias.  All other systems reviewed and are negative.   Allergies  Codeine and Sulfa antibiotics  Home Medications   Prior to Admission medications   Medication Sig Start Date End Date Taking? Authorizing Provider  atorvastatin (LIPITOR) 20 MG tablet Take 1 tablet (20 mg total) by mouth daily. 05/15/15   Marletta Lor, MD  diltiazem (DILACOR XR) 120 MG 24 hr capsule Take 1 capsule (120 mg total) by mouth daily. 05/15/15   Marletta Lor, MD  escitalopram (LEXAPRO) 10 MG tablet Take 1 tablet (10 mg total) by mouth daily. 05/15/15   Marletta Lor, MD  furosemide (LASIX) 40 MG tablet Take 1 tablet (40 mg total) by mouth daily. 05/15/15   Marletta Lor, MD  furosemide (LASIX) 40 MG tablet TAKE 1 TABLET BY MOUTH EVERY DAY 07/10/15  Marletta Lor, MD  gabapentin (NEURONTIN) 300 MG capsule Take 1 capsule (300 mg total) by mouth 2 (two) times daily. 05/15/15   Marletta Lor, MD  lidocaine (LIDODERM) 5 % Place 1 patch onto the skin daily. Remove & Discard patch within 12 hours or as directed by MD Patient not taking: Reported on 07/03/2015 06/19/15   Marletta Lor, MD  mirabegron ER (MYRBETRIQ) 25 MG TB24 tablet Take 1 tablet (25 mg total) by mouth daily. 07/03/15   Marletta Lor, MD  QUEtiapine (SEROQUEL) 25 MG tablet Take 1 tablet (25 mg total) by mouth 3 (three) times daily. 05/15/15   Marletta Lor, MD  traMADol Veatrice Bourbon) 50 MG tablet TAKE 1 TABLET BY MOUTH FOUR TIMES DAILY 08/10/15   Marletta Lor, MD  traZODone (DESYREL) 150 MG tablet TAKE 1 TABLET BY MOUTH EVERY  NIGHT AT BEDTIME 05/22/15   Marletta Lor, MD  warfarin (COUMADIN) 5 MG tablet Take as directed by anticoagulation clinic 07/17/15   Marletta Lor, MD   BP 138/74 mmHg  Pulse 65  Temp(Src) 97.7 F (36.5 C) (Oral)  Resp 16  SpO2 96%  LMP 10/10/1969   Physical Exam  Constitutional: She appears well-developed and well-nourished. No distress.  HENT:  Head: Normocephalic and atraumatic.  Mouth/Throat: Oropharynx is clear and moist. No oropharyngeal exudate.  Eyes: Conjunctivae and EOM are normal. Pupils are equal, round, and reactive to light. Right eye exhibits no discharge. Left eye exhibits no discharge. No scleral icterus.  Neck: Normal range of motion. Neck supple. No JVD present. No thyromegaly present.  Cardiovascular: Normal rate, regular rhythm, normal heart sounds and intact distal pulses.  Exam reveals no gallop and no friction rub.   No murmur heard. Pulmonary/Chest: Effort normal and breath sounds normal. No respiratory distress. She has no wheezes. She has no rales.  Abdominal: Soft. Bowel sounds are normal. She exhibits no distension and no mass. There is tenderness.  Mild RLQ tenderness   Musculoskeletal: Normal range of motion. She exhibits no edema.  Right CVA tenderness; previous laminectomy scars, healing well; mild diffuse tenderness surrounding;  normal ROM in hip  Lymphadenopathy:    She has no cervical adenopathy.  Neurological: She is alert. Coordination normal.  No saddle anesthesia; negative straight leg raise  Skin: Skin is warm and dry. No rash noted. No erythema.  Psychiatric: She has a normal mood and affect. Her behavior is normal.  Nursing note and vitals reviewed.   ED Course  Procedures  DIAGNOSTIC STUDIES:  Oxygen Saturation is 97% on RA, normal by my interpretation.    COORDINATION OF CARE:  12:07 AM Will order UA, CMP, CBC, chest x-ray, CT renal stone study and CT lumbar spine. Discussed treatment plan with pt at bedside and pt  agreed to plan.  Labs Review Labs Reviewed  CBC WITH DIFFERENTIAL/PLATELET - Abnormal; Notable for the following:    WBC 12.6 (*)    RBC 3.49 (*)    Hemoglobin 9.3 (*)    HCT 29.8 (*)    RDW 16.5 (*)    Neutro Abs 10.9 (*)    All other components within normal limits  PROTIME-INR - Abnormal; Notable for the following:    Prothrombin Time 36.7 (*)    INR 3.82 (*)    All other components within normal limits  COMPREHENSIVE METABOLIC PANEL - Abnormal; Notable for the following:    Sodium 134 (*)    Glucose, Bld 115 (*)    BUN  43 (*)    Creatinine, Ser 1.46 (*)    Calcium 8.1 (*)    Total Protein 5.3 (*)    Albumin 2.9 (*)    ALT 13 (*)    GFR calc non Af Amer 32 (*)    GFR calc Af Amer 37 (*)    All other components within normal limits  URINALYSIS, ROUTINE W REFLEX MICROSCOPIC (NOT AT [facility]) - Abnormal; Notable for the following:    APPearance CLOUDY (*)    Leukocytes, UA TRACE (*)    All other components within normal limits  URINE MICROSCOPIC-ADD ON - Abnormal; Notable for the following:    Squamous Epithelial / LPF 0-5 (*)    Bacteria, UA RARE (*)    All other components within normal limits  POC OCCULT BLOOD, ED    Imaging Review Dg Chest 2 View  08/13/2015  CLINICAL DATA:  Acute onset of lower back pain.  Initial encounter. EXAM: CHEST  2 VIEW COMPARISON:  Chest radiograph performed 01/16/2015 FINDINGS: The lungs are hyperexpanded, with flattening of the hemidiaphragms, compatible with COPD. Peribronchial thickening is seen. There is no evidence of focal opacification, pleural effusion or pneumothorax. The heart is normal in size; the mediastinal contour is within normal limits. No acute osseous abnormalities are seen. IMPRESSION: Findings of COPD.  Peribronchial thickening noted. Electronically Signed   By: Garald Balding M.D.   On: 08/13/2015 01:56   Ct L-spine No Charge  08/13/2015  CLINICAL DATA:  Lower bilateral flank pain for several days. Remote lumbar disc surgery.  EXAM: CT LUMBAR SPINE WITHOUT CONTRAST TECHNIQUE: Multidetector CT imaging of the lumbar spine was performed without intravenous contrast administration. Multiplanar CT image reconstructions were also generated. COMPARISON:  None. FINDINGS: No evidence of acute fracture or subluxation. There is mild levoscoliosis with asymmetric right disc narrowing at L4-5. No signs of bone infection or aggressive lesion. Renal cortical scarring bilaterally. No acute retroperitoneal finding. Notable levels: L3-4: Facet arthropathy and ligamentum flavum thickening. Mild triangular narrowing of the thecal sac. Mild disc narrowing with annulus calcification. No signs of impingement. L4-5: Advanced disc narrowing preferentially to the right. Disc bulging. Facet arthropathy with bony and ligamentous overgrowth. There has been previous laminotomy with greater exposure on the left. No evidence of impingement. L5-S1: Mild disc narrowing and bulging. Mild marginal spurring. No evidence of impingement. IMPRESSION: 1. No acute finding. 2. L4-5 predominant disc and facet degeneration with mild levoscoliosis. No high-grade stenosis or definitive impingement. Electronically Signed   By: Monte Fantasia M.D.   On: 08/13/2015 01:43   Ct Renal Stone Study  08/13/2015  CLINICAL DATA:  Acute onset of lower bilateral flank pain. Initial encounter. EXAM: CT ABDOMEN AND PELVIS WITHOUT CONTRAST TECHNIQUE: Multidetector CT imaging of the abdomen and pelvis was performed following the standard protocol without IV contrast. COMPARISON:  None. FINDINGS: Minimal bibasilar scarring is noted. Bilateral breast implants are partially imaged. The liver and spleen are unremarkable in appearance. The gallbladder is within normal limits. The pancreas and adrenal glands are unremarkable. Bilateral renal fetal lobulations are noted, with mild bilateral scarring. There is no evidence of hydronephrosis. No renal or ureteral stones are seen. No perinephric stranding is  appreciated. No free fluid is identified. The small bowel is unremarkable in appearance. The stomach is within normal limits. No acute vascular abnormalities are seen. Scattered calcification is noted along the abdominal aorta and its branches. The appendix is normal in caliber, without evidence of appendicitis. The colon is partially filled with  stool and is grossly unremarkable in appearance. A few contrast filled diverticula are noted. The bladder is mildly distended and grossly unremarkable. The patient is status post hysterectomy. The ovaries are relatively symmetric. No suspicious adnexal masses are seen. No inguinal lymphadenopathy is seen. No acute osseous abnormalities are identified. Vacuum phenomenon is noted at L4-L5, with associated endplate sclerotic change. IMPRESSION: 1. No acute abnormality seen to explain the patient's symptoms. 2. Mild bilateral renal scarring. 3. Scattered calcification along the abdominal aorta and its branches. Electronically Signed   By: Garald Balding M.D.   On: 08/13/2015 01:48   I have personally reviewed and evaluated these images and lab results as part of my medical decision-making.   EKG Interpretation None      MDM   Final diagnoses:  Flank pain   Jannetta Woulard is a 82 y.o. female here with back pain, flank pain after fall. She is on coumadin. She has no head or neck injury. She has nl neuro exam and able to ambulate but just has more pain. Also has R CVAT. Will get CT renal stone, CT lumbar spine, labs, UA.   3:16 AM Cr. 1.4, slightly elevated compared to baseline. Hg 9 and was 13 before. Occ negative and denies black stools. INR 3.8. CT showed L4-5 prominent disc but no obvious high grade stenosis. Neurovascular intact so doesn't need emergent MRI. Recommend stopping coumadin for 2 days and recheck CBC and INR in 5 days with PCP. She has PCP follow up in 2 days. Will give short course of vicodin for pain.    I personally performed the services  described in this documentation, which was scribed in my presence. The recorded information has been reviewed and is accurate.    Wandra Arthurs, MD 08/13/15 [phone]

## 2015-08-14 ENCOUNTER — Other Ambulatory Visit: Payer: Self-pay | Admitting: Internal Medicine

## 2015-08-14 DIAGNOSIS — M545 Low back pain: Secondary | ICD-10-CM | POA: Diagnosis not present

## 2015-08-17 ENCOUNTER — Telehealth: Payer: Self-pay | Admitting: Internal Medicine

## 2015-08-17 ENCOUNTER — Other Ambulatory Visit: Payer: Self-pay | Admitting: General Practice

## 2015-08-17 ENCOUNTER — Other Ambulatory Visit: Payer: Self-pay | Admitting: Internal Medicine

## 2015-08-17 MED ORDER — QUETIAPINE FUMARATE 25 MG PO TABS: 25.0000 mg | ORAL_TABLET | Freq: Three times a day (TID) | ORAL | Status: DC

## 2015-08-17 NOTE — Telephone Encounter (Signed)
Pt request refill of the following:    QUEtiapine (SEROQUEL) 25 MG tablet   Pt daughter call to say pt insurance will only cover 2 a day so they need a new rx for 2 a day     Phamacy: [facility]

## 2015-08-17 NOTE — Telephone Encounter (Signed)
Dr.K, please see message and advise if okay to change dosage?

## 2015-08-18 MED ORDER — QUETIAPINE FUMARATE 25 MG PO TABS: 25.0000 mg | ORAL_TABLET | Freq: Two times a day (BID) | ORAL | Status: DC

## 2015-08-18 NOTE — Telephone Encounter (Signed)
Spoke to [place], told her Dr.K said it was okay to change Seroquel to twice a day and new Rx sent to [facility] as requested. Beth verbalized understanding.

## 2015-08-18 NOTE — Telephone Encounter (Signed)
Okay to change to 2 daily

## 2015-08-19 DIAGNOSIS — M545 Low back pain: Secondary | ICD-10-CM | POA: Diagnosis not present

## 2015-08-21 DIAGNOSIS — S335XXA Sprain of ligaments of lumbar spine, initial encounter: Secondary | ICD-10-CM | POA: Diagnosis not present

## 2015-08-21 DIAGNOSIS — M545 Low back pain: Secondary | ICD-10-CM | POA: Diagnosis not present

## 2015-08-25 ENCOUNTER — Emergency Department (HOSPITAL_COMMUNITY)
Admission: EM | Admit: 2015-08-25 | Discharge: 2015-08-25 | Disposition: A | Discharge status: Home / Self Care | Payer: Medicare Other | Attending: Emergency Medicine | Admitting: Emergency Medicine

## 2015-08-25 ENCOUNTER — Encounter (HOSPITAL_COMMUNITY): Payer: Self-pay | Admitting: Emergency Medicine

## 2015-08-25 DIAGNOSIS — R42 Dizziness and giddiness: Secondary | ICD-10-CM

## 2015-08-25 DIAGNOSIS — Z79899 Other long term (current) drug therapy: Secondary | ICD-10-CM | POA: Insufficient documentation

## 2015-08-25 DIAGNOSIS — E785 Hyperlipidemia, unspecified: Secondary | ICD-10-CM | POA: Diagnosis not present

## 2015-08-25 DIAGNOSIS — Z7901 Long term (current) use of anticoagulants: Secondary | ICD-10-CM | POA: Insufficient documentation

## 2015-08-25 DIAGNOSIS — I10 Essential (primary) hypertension: Secondary | ICD-10-CM | POA: Insufficient documentation

## 2015-08-25 DIAGNOSIS — Z853 Personal history of malignant neoplasm of breast: Secondary | ICD-10-CM | POA: Insufficient documentation

## 2015-08-25 DIAGNOSIS — R404 Transient alteration of awareness: Secondary | ICD-10-CM | POA: Diagnosis not present

## 2015-08-25 NOTE — Discharge Instructions (Signed)

## 2015-08-25 NOTE — ED Notes (Signed)
Pt's daughter here.  Talking with pt.  Pt st's she did not need to be here,. Pt  St's facility staff got excited because she told them she felt a little dizzy,   Dr. Eulis Foster made aware and screened pt.  Pt's daughter also st's she does not think her mother needs to be here.  Pt discharged home with family

## 2015-08-25 NOTE — ED Provider Notes (Signed)
MSE was initiated and I personally evaluated the patient and placed orders (if any) at  6:59 PM on August 25, 2015.  Tracy Rodriguez is a 82 y.o. female who presents for evaluation of a feeling of dizziness after eating. He is recovering from hip injury and taking Vicodin. Patient feels that her baseline now. She states that she didn't want to be transferred here, but people at her care facility transfer her anyway. The patient's daughter is now here and agrees with this statement. The daughter feels that she is at her baseline. Patient denies pain, weakness, dizziness or nausea at this time. She is alert, lucid and cooperative. There is no respiratory distress.  The patient appears stable. She and her daughter, decline further ED evaluation. She lives in a nursing care facility where she can be monitored.  Medications - No data to display  Patient Vitals for the past 24 hrs:  BP Temp Temp src Pulse Resp SpO2  08/25/15 1832 133/61 mmHg 98 F (36.7 C) Oral 60 14 97 %     I advise continue usual treatment and return here as needed for problems.  Daleen Bo, MD 08/25/15 2312

## 2015-08-25 NOTE — ED Notes (Signed)
Pt to ED via [facility] from [place] after reported feeling dizzy after eating supper.  Pt denies any complaints at this time.  Pt alert and oriented, skin warm and dry, color appropriate

## 2015-09-05 DIAGNOSIS — R42 Dizziness and giddiness: Secondary | ICD-10-CM | POA: Diagnosis not present

## 2015-09-05 DIAGNOSIS — R404 Transient alteration of awareness: Secondary | ICD-10-CM | POA: Diagnosis not present

## 2015-09-08 ENCOUNTER — Other Ambulatory Visit: Payer: Self-pay | Admitting: Internal Medicine

## 2015-09-10 DIAGNOSIS — R531 Weakness: Secondary | ICD-10-CM | POA: Diagnosis not present

## 2015-09-10 DIAGNOSIS — R404 Transient alteration of awareness: Secondary | ICD-10-CM | POA: Diagnosis not present

## 2015-09-18 DIAGNOSIS — Z6827 Body mass index (BMI) 27.0-27.9, adult: Secondary | ICD-10-CM | POA: Diagnosis not present

## 2015-09-18 DIAGNOSIS — I10 Essential (primary) hypertension: Secondary | ICD-10-CM | POA: Diagnosis not present

## 2015-09-18 DIAGNOSIS — M5440 Lumbago with sciatica, unspecified side: Secondary | ICD-10-CM | POA: Diagnosis not present

## 2015-10-09 ENCOUNTER — Ambulatory Visit (INDEPENDENT_AMBULATORY_CARE_PROVIDER_SITE_OTHER): Payer: Medicare Other

## 2015-10-09 DIAGNOSIS — Z5181 Encounter for therapeutic drug level monitoring: Secondary | ICD-10-CM | POA: Diagnosis not present

## 2015-10-09 DIAGNOSIS — I4891 Unspecified atrial fibrillation: Secondary | ICD-10-CM | POA: Diagnosis not present

## 2015-10-09 LAB — POCT INR: INR: 1.1

## 2015-10-10 DIAGNOSIS — I4891 Unspecified atrial fibrillation: Secondary | ICD-10-CM | POA: Diagnosis not present

## 2015-10-10 DIAGNOSIS — Z6828 Body mass index (BMI) 28.0-28.9, adult: Secondary | ICD-10-CM | POA: Diagnosis not present

## 2015-10-10 DIAGNOSIS — I4892 Unspecified atrial flutter: Secondary | ICD-10-CM | POA: Diagnosis not present

## 2015-10-10 DIAGNOSIS — M4806 Spinal stenosis, lumbar region: Secondary | ICD-10-CM | POA: Diagnosis not present

## 2015-10-10 DIAGNOSIS — M5416 Radiculopathy, lumbar region: Secondary | ICD-10-CM | POA: Diagnosis not present

## 2015-10-11 ENCOUNTER — Ambulatory Visit (INDEPENDENT_AMBULATORY_CARE_PROVIDER_SITE_OTHER): Payer: Medicare Other | Admitting: Internal Medicine

## 2015-10-11 ENCOUNTER — Encounter: Payer: Self-pay | Admitting: Internal Medicine

## 2015-10-11 ENCOUNTER — Other Ambulatory Visit: Payer: Self-pay

## 2015-10-11 VITALS — BP 124/68 | HR 69 | Temp 97.7°F | Wt 177.2 lb

## 2015-10-11 DIAGNOSIS — G8929 Other chronic pain: Secondary | ICD-10-CM | POA: Diagnosis not present

## 2015-10-11 DIAGNOSIS — R2681 Unsteadiness on feet: Secondary | ICD-10-CM

## 2015-10-11 DIAGNOSIS — I10 Essential (primary) hypertension: Secondary | ICD-10-CM

## 2015-10-11 DIAGNOSIS — M549 Dorsalgia, unspecified: Secondary | ICD-10-CM

## 2015-10-11 DIAGNOSIS — I4891 Unspecified atrial fibrillation: Secondary | ICD-10-CM

## 2015-10-11 DIAGNOSIS — R5381 Other malaise: Secondary | ICD-10-CM

## 2015-10-11 DIAGNOSIS — D500 Iron deficiency anemia secondary to blood loss (chronic): Secondary | ICD-10-CM

## 2015-10-11 LAB — CBC WITH DIFFERENTIAL/PLATELET
Basophils Absolute: 0 10*3/uL (ref 0.0–0.1)
Basophils Relative: 0.2 % (ref 0.0–3.0)
Eosinophils Absolute: 0.1 10*3/uL (ref 0.0–0.7)
Eosinophils Relative: 1.1 % (ref 0.0–5.0)
HCT: 28 % — ABNORMAL LOW (ref 36.0–46.0)
Hemoglobin: 9 g/dL — ABNORMAL LOW (ref 12.0–15.0)
Lymphocytes Relative: 17.8 % (ref 12.0–46.0)
Lymphs Abs: 1.2 10*3/uL (ref 0.7–4.0)
MCHC: 32.2 g/dL (ref 30.0–36.0)
MCV: 80.4 fl (ref 78.0–100.0)
Monocytes Absolute: 0.6 10*3/uL (ref 0.1–1.0)
Monocytes Relative: 9.4 % (ref 3.0–12.0)
Neutro Abs: 4.7 10*3/uL (ref 1.4–7.7)
Neutrophils Relative %: 71.5 % (ref 43.0–77.0)
Platelets: 270 10*3/uL (ref 150.0–400.0)
RBC: 3.48 Mil/uL — ABNORMAL LOW (ref 3.87–5.11)
RDW: 17 % — ABNORMAL HIGH (ref 11.5–15.5)
WBC: 6.5 10*3/uL (ref 4.0–10.5)

## 2015-10-11 LAB — COMPREHENSIVE METABOLIC PANEL
ALT: 7 U/L (ref 0–35)
AST: 15 U/L (ref 0–37)
Albumin: 4 g/dL (ref 3.5–5.2)
Alkaline Phosphatase: 68 U/L (ref 39–117)
BUN: 18 mg/dL (ref 6–23)
CO2: 28 mEq/L (ref 19–32)
Calcium: 9.3 mg/dL (ref 8.4–10.5)
Chloride: 104 mEq/L (ref 96–112)
Creatinine, Ser: 1.14 mg/dL (ref 0.40–1.20)
GFR: 48.44 mL/min — ABNORMAL LOW (ref >60.00)
Glucose, Bld: 80 mg/dL (ref 70–99)
Potassium: 4 mEq/L (ref 3.5–5.1)
Sodium: 141 mEq/L (ref 135–145)
Total Bilirubin: 0.4 mg/dL (ref 0.2–1.2)
Total Protein: 6.8 g/dL (ref 6.0–8.3)

## 2015-10-11 LAB — FERRITIN: Ferritin: 8.9 ng/mL — ABNORMAL LOW (ref 10.0–291.0)

## 2015-10-11 MED ORDER — TRAMADOL HCL 50 MG PO TABS: 50.0000 mg | ORAL_TABLET | Freq: Four times a day (QID) | ORAL | 2 refills | Status: DC

## 2015-10-11 MED ORDER — MIRABEGRON ER 25 MG PO TB24: 25.0000 mg | ORAL_TABLET | Freq: Every day | ORAL | 3 refills | Status: DC

## 2015-10-11 NOTE — Progress Notes (Signed)
Subjective:    Patient ID: Tracy Rodriguez, female    DOB: 04/28/1931, 82 y.o.   MRN: [id number]  HPI 82 year old patient who is seen today accompanied by 2 daughters.  She is a resident of [facility]. She has chronic low back pain and has been referred to the pain clinic and did have a epidural yesterday.  She also complains of left knee and rib pain. She has had 3 falls since her last visit here to requiring an ER visit  Laboratory studies were reviewed and revealed significant anemia in June.  Stool for occult blood was negative at that encounter.  Patient denies any major change in her bowel habits but perhaps her stool has been a bit darker.  Her daughter was concerned about anemia since the patient has been experiencing pica  Requesting a refill on tramadol  Due to back pain.  Patient has become quite deconditioned and spends much time in bed.  Past Medical History:  Diagnosis Date  . Arthritis    "joints" (08/04/2014)  . Bipolar 1 disorder ([place])   . Cancer of right breast ([place])   . Chronic lower back pain   . Hyperlipemia   . Hypertension      Social History   Social History  . Marital status: Widowed    Spouse name: N/A  . Number of children: N/A  . Years of education: N/A   Occupational History  . Not on file.   Social History Main Topics  . Smoking status: Never Smoker  . Smokeless tobacco: Never Used  . Alcohol use No  . Drug use: No  . Sexual activity: No   Other Topics Concern  . Not on file   Social History Narrative  . No narrative on file    Past Surgical History:  Procedure Laterality Date  . BACK SURGERY    . BREAST BIOPSY Right   . BREAST RECONSTRUCTION Right   . CATARACT EXTRACTION W/ INTRAOCULAR LENS  IMPLANT, BILATERAL Bilateral   . EXCISIONAL HEMORRHOIDECTOMY    . [place]; 1973; 1985   "ruptured discs each time"  . MASTECTOMY Right    cancer  . PLACEMENT OF BREAST IMPLANTS Bilateral    "had to take tissure out of  left"  . TONSILLECTOMY    . VAGINAL HYSTERECTOMY      Family History  Problem Relation Age of Onset  . Stroke Mother   . Heart disease Father   . Alzheimer's disease Sister   . Leukemia Brother   . Arthritis Maternal Grandmother     Allergies  Allergen Reactions  . Codeine Anaphylaxis  . Sulfa Antibiotics Rash    Current Outpatient Prescriptions on File Prior to Visit  Medication Sig Dispense Refill  . atorvastatin (LIPITOR) 20 MG tablet Take 1 tablet (20 mg total) by mouth daily. 90 tablet 3  . diltiazem (DILACOR XR) 120 MG 24 hr capsule Take 1 capsule (120 mg total) by mouth daily. 90 capsule 3  . escitalopram (LEXAPRO) 10 MG tablet Take 1 tablet (10 mg total) by mouth daily. 90 tablet 1  . furosemide (LASIX) 40 MG tablet TAKE 1 TABLET BY MOUTH EVERY DAY 90 tablet 1  . gabapentin (NEURONTIN) 300 MG capsule Take 1 capsule (300 mg total) by mouth 2 (two) times daily. 180 capsule 3  . QUEtiapine (SEROQUEL) 25 MG tablet Take 1 tablet (25 mg total) by mouth 2 (two) times daily. 180 tablet 1  . traZODone (DESYREL) 150 MG tablet  TAKE 1 TABLET BY MOUTH EVERY NIGHT AT BEDTIME 90 tablet 2  . warfarin (COUMADIN) 5 MG tablet Take as directed by anticoagulation clinic 30 tablet 3   No current facility-administered medications on file prior to visit.     BP 124/68 (BP Location: Left Arm, Patient Position: Sitting, Cuff Size: Normal)   Pulse 69   Temp 97.7 F (36.5 C) (Oral)   Wt 177 lb 3.2 oz (80.4 kg)   LMP 10/10/1969   SpO2 93%   BMI 29.04 kg/m       Review of Systems  Constitutional: Positive for activity change, appetite change and fatigue.  HENT: Negative for congestion, dental problem, hearing loss, rhinorrhea, sinus pressure, sore throat and tinnitus.   Eyes: Negative for pain, discharge and visual disturbance.  Respiratory: Negative for cough and shortness of breath.   Cardiovascular: Negative for chest pain, palpitations and leg swelling.  Gastrointestinal: Negative  for abdominal distention, abdominal pain, blood in stool, constipation, diarrhea, nausea and vomiting.  Genitourinary: Negative for difficulty urinating, dysuria, flank pain, frequency, hematuria, pelvic pain, urgency, vaginal bleeding, vaginal discharge and vaginal pain.  Musculoskeletal: Positive for arthralgias, back pain and gait problem. Negative for joint swelling.  Skin: Negative for rash.  Neurological: Positive for weakness. Negative for dizziness, syncope, speech difficulty, numbness and headaches.  Hematological: Negative for adenopathy.  Psychiatric/Behavioral: Positive for confusion and decreased concentration. Negative for agitation, behavioral problems and dysphoric mood. The patient is not nervous/anxious.        Objective:   Physical Exam  Constitutional: She is oriented to person, place, and time. She appears well-developed and well-nourished.  Appears weak and deconditioned Requires assistance with transfers  HENT:  Head: Normocephalic.  Right Ear: External ear normal.  Left Ear: External ear normal.  Mouth/Throat: Oropharynx is clear and moist.  Eyes: Conjunctivae and EOM are normal. Pupils are equal, round, and reactive to light.  Neck: Normal range of motion. Neck supple. No thyromegaly present.  Cardiovascular: Normal rate, regular rhythm, normal heart sounds and intact distal pulses.   Pulmonary/Chest: Effort normal and breath sounds normal.  Abdominal: Soft. Bowel sounds are normal. She exhibits no mass. There is no tenderness.  Musculoskeletal: Normal range of motion.  Lymphadenopathy:    She has no cervical adenopathy.  Neurological: She is alert and oriented to person, place, and time.  Skin: Skin is warm and dry. No rash noted.  Psychiatric: She has a normal mood and affect. Her behavior is normal.  Some cognitive impairment          Assessment & Plan:  Deconditioning/frequent falls.  Prescription for 4-point walker given.  Will set up for physical  therapy History of anemia.  Will check stool for occult blood and check a CBC and ferritin level today.  Patient has had colonoscopies in the past, but none recent Chronic low back pain Chronic Coumadin anticoagulation due to atrial fibrillation. May need to withhold anticoagulation if GI bleeding.  Documented and anemia has worsened  Recheck 2 weeks.  Possibly earlier depending on results of her lab.  May need inpatient or outpatient GI evaluation  [name], MD

## 2015-10-11 NOTE — Patient Instructions (Signed)
Physical therapy consultation as discussed  Return slides to check for hidden blood  Return in 2 weeks for follow-up

## 2015-10-11 NOTE — Progress Notes (Signed)
Pre visit review using our clinic review tool, if applicable. No additional management support is needed unless otherwise documented below in the visit note. 

## 2015-10-13 ENCOUNTER — Other Ambulatory Visit: Payer: Self-pay | Admitting: Emergency Medicine

## 2015-10-13 DIAGNOSIS — D509 Iron deficiency anemia, unspecified: Secondary | ICD-10-CM

## 2015-10-16 ENCOUNTER — Encounter: Payer: Self-pay | Admitting: Gastroenterology

## 2015-10-22 ENCOUNTER — Emergency Department (HOSPITAL_COMMUNITY): Payer: Medicare Other

## 2015-10-22 ENCOUNTER — Encounter (HOSPITAL_COMMUNITY): Payer: Self-pay

## 2015-10-22 ENCOUNTER — Emergency Department (HOSPITAL_COMMUNITY)
Admission: EM | Admit: 2015-10-22 | Discharge: 2015-10-22 | Disposition: A | Discharge status: Home / Self Care | Payer: Medicare Other | Attending: Emergency Medicine | Admitting: Emergency Medicine

## 2015-10-22 DIAGNOSIS — R0602 Shortness of breath: Secondary | ICD-10-CM | POA: Diagnosis not present

## 2015-10-22 DIAGNOSIS — Z7901 Long term (current) use of anticoagulants: Secondary | ICD-10-CM | POA: Diagnosis not present

## 2015-10-22 DIAGNOSIS — D649 Anemia, unspecified: Secondary | ICD-10-CM | POA: Insufficient documentation

## 2015-10-22 DIAGNOSIS — Z79899 Other long term (current) drug therapy: Secondary | ICD-10-CM | POA: Insufficient documentation

## 2015-10-22 DIAGNOSIS — Z853 Personal history of malignant neoplasm of breast: Secondary | ICD-10-CM | POA: Insufficient documentation

## 2015-10-22 DIAGNOSIS — I10 Essential (primary) hypertension: Secondary | ICD-10-CM | POA: Diagnosis not present

## 2015-10-22 DIAGNOSIS — K5900 Constipation, unspecified: Secondary | ICD-10-CM | POA: Insufficient documentation

## 2015-10-22 DIAGNOSIS — R069 Unspecified abnormalities of breathing: Secondary | ICD-10-CM | POA: Diagnosis not present

## 2015-10-22 DIAGNOSIS — R0600 Dyspnea, unspecified: Secondary | ICD-10-CM

## 2015-10-22 LAB — I-STAT TROPONIN, ED: Troponin i, poc: 0.03 ng/mL (ref 0.00–0.08)

## 2015-10-22 LAB — CBC WITH DIFFERENTIAL/PLATELET
Basophils Absolute: 0 10*3/uL (ref 0.0–0.1)
Basophils Relative: 0 %
Eosinophils Absolute: 0.1 10*3/uL (ref 0.0–0.7)
Eosinophils Relative: 1 %
HCT: 35.3 % — ABNORMAL LOW (ref 36.0–46.0)
Hemoglobin: 10.5 g/dL — ABNORMAL LOW (ref 12.0–15.0)
Lymphocytes Relative: 20 %
Lymphs Abs: 1.5 10*3/uL (ref 0.7–4.0)
MCH: 24.6 pg — ABNORMAL LOW (ref 26.0–34.0)
MCHC: 29.7 g/dL — ABNORMAL LOW (ref 30.0–36.0)
MCV: 82.7 fL (ref 78.0–100.0)
Monocytes Absolute: 0.5 10*3/uL (ref 0.1–1.0)
Monocytes Relative: 7 %
Neutro Abs: 5.7 10*3/uL (ref 1.7–7.7)
Neutrophils Relative %: 72 %
Platelets: 281 10*3/uL (ref 150–400)
RBC: 4.27 MIL/uL (ref 3.87–5.11)
RDW: 15.7 % — ABNORMAL HIGH (ref 11.5–15.5)
WBC: 7.9 10*3/uL (ref 4.0–10.5)

## 2015-10-22 LAB — COMPREHENSIVE METABOLIC PANEL
ALT: 7 U/L — ABNORMAL LOW (ref 14–54)
AST: 16 U/L (ref 15–41)
Albumin: 3.9 g/dL (ref 3.5–5.0)
Alkaline Phosphatase: 67 U/L (ref 38–126)
Anion gap: 10 (ref 5–15)
BUN: 17 mg/dL (ref 6–20)
CO2: 25 mmol/L (ref 22–32)
Calcium: 9.2 mg/dL (ref 8.9–10.3)
Chloride: 103 mmol/L (ref 101–111)
Creatinine, Ser: 1.18 mg/dL — ABNORMAL HIGH (ref 0.44–1.00)
GFR calc Af Amer: 48 mL/min — ABNORMAL LOW (ref >60)
GFR calc non Af Amer: 42 mL/min — ABNORMAL LOW (ref >60)
Glucose, Bld: 106 mg/dL — ABNORMAL HIGH (ref 65–99)
Potassium: 3.8 mmol/L (ref 3.5–5.1)
Sodium: 138 mmol/L (ref 135–145)
Total Bilirubin: 0.4 mg/dL (ref 0.3–1.2)
Total Protein: 7.2 g/dL (ref 6.5–8.1)

## 2015-10-22 LAB — BRAIN NATRIURETIC PEPTIDE: B Natriuretic Peptide: 118.7 pg/mL — ABNORMAL HIGH (ref 0.0–100.0)

## 2015-10-22 LAB — POC OCCULT BLOOD, ED: Fecal Occult Bld: POSITIVE — AB

## 2015-10-22 NOTE — ED Provider Notes (Signed)
[facility] DEPT Provider Note   CSN: [id number] Arrival date & time: 10/22/15  1731  First Provider Contact:  First MD Initiated Contact with Patient 10/22/15 1746        History   Chief Complaint Chief Complaint  Patient presents with  . Shortness of Breath    HPI Tracy Rodriguez is a 82 y.o. female.  68-year-old Caucasian female past history significant for atrial fibrilation and hypertension presents from and assisted living by EMS to the ED this evening with trouble catching her breath for the last 1-2 weeks. Patient states that the dyspnea is constant and has gotten progressively worse. She also has associated weakness. She has been lying in bed for the past week. Nothing makes better or worse.She denies any cp. Dyspnea is not associated with exertion. She denies any orthopnea or nocturnal dyspnea. Patient states the swelling in her legs is not getting worse. Patient has been "sick" for the past week. She denies any uri symptoms except for cough, palpitations, n/v/d, abd pain, urinary symptoms, change in bowel habits, hematochezia, or melena.  She state she has not taken her bp medicine today because she forgot.  Daughter at bedside states that her PCP is following her for anemia. She has been providing hemoccult to PCP. They recently worked the patient up for iron deficiency anemia. Hgb in April 2017 was 12. Most recent Hgb 10/11/15 was 9. Daughter has a hemoccult card in the room and would like it tested. Per daughter patient has been more labile. She has been lying in bed most days for the past 1-2 weeks.   The history is provided by the patient.  Shortness of Breath  Associated symptoms include cough and leg swelling. Pertinent negatives include no fever, no headaches, no rhinorrhea, no sore throat, no wheezing, no chest pain, no vomiting and no abdominal pain.    Past Medical History:  Diagnosis Date  . Arthritis    "joints" (08/04/2014)  . Bipolar 1 disorder ([place])   .  Cancer of right breast ([place])   . Chronic lower back pain   . Hyperlipemia   . Hypertension     Patient Active Problem List   Diagnosis Date Noted  . Encounter for therapeutic drug monitoring 12/22/2014  . Chest pain at rest 08/04/2014  . Atrial fibrillation with RVR ([place]) 08/04/2014  . New onset atrial fibrillation ([place]) 08/04/2014  . Essential hypertension   . Obesity 09/27/2013  . Chronic back pain 09/14/2012  . Bipolar disorder ([place]) 09/14/2012    Past Surgical History:  Procedure Laterality Date  . BACK SURGERY    . BREAST BIOPSY Right   . BREAST RECONSTRUCTION Right   . CATARACT EXTRACTION W/ INTRAOCULAR LENS  IMPLANT, BILATERAL Bilateral   . EXCISIONAL HEMORRHOIDECTOMY    . [place]; 1973; 1985   "ruptured discs each time"  . MASTECTOMY Right    cancer  . PLACEMENT OF BREAST IMPLANTS Bilateral    "had to take tissure out of left"  . TONSILLECTOMY    . VAGINAL HYSTERECTOMY      OB History    No data available       Home Medications    Prior to Admission medications   Medication Sig Start Date End Date Taking? Authorizing Provider  atorvastatin (LIPITOR) 20 MG tablet Take 1 tablet (20 mg total) by mouth daily. 05/15/15   Marletta Lor, MD  diltiazem (DILACOR XR) 120 MG 24 hr capsule Take 1 capsule (120 mg total)  by mouth daily. 05/15/15   Marletta Lor, MD  escitalopram (LEXAPRO) 10 MG tablet Take 1 tablet (10 mg total) by mouth daily. 05/15/15   Marletta Lor, MD  furosemide (LASIX) 40 MG tablet TAKE 1 TABLET BY MOUTH EVERY DAY 07/10/15   Marletta Lor, MD  gabapentin (NEURONTIN) 300 MG capsule Take 1 capsule (300 mg total) by mouth 2 (two) times daily. 05/15/15   Marletta Lor, MD  mirabegron ER (MYRBETRIQ) 25 MG TB24 tablet Take 1 tablet (25 mg total) by mouth daily. 10/11/15   Marletta Lor, MD  QUEtiapine (SEROQUEL) 25 MG tablet Take 1 tablet (25 mg total) by mouth 2 (two) times daily. 08/18/15   Marletta Lor,  MD  traMADol (ULTRAM) 50 MG tablet Take 1 tablet (50 mg total) by mouth 4 (four) times daily. 10/11/15   Marletta Lor, MD  traMADol (ULTRAM) 50 MG tablet Take 1 tablet (50 mg total) by mouth 4 (four) times daily. 10/11/15   Marletta Lor, MD  traZODone (DESYREL) 150 MG tablet TAKE 1 TABLET BY MOUTH EVERY NIGHT AT BEDTIME 08/15/15   Marletta Lor, MD  warfarin (COUMADIN) 5 MG tablet Take as directed by anticoagulation clinic 07/17/15   Marletta Lor, MD    Family History Family History  Problem Relation Age of Onset  . Stroke Mother   . Heart disease Father   . Alzheimer's disease Sister   . Leukemia Brother   . Arthritis Maternal Grandmother     Social History Social History  Substance Use Topics  . Smoking status: Never Smoker  . Smokeless tobacco: Never Used  . Alcohol use No     Allergies   Codeine and Sulfa antibiotics   Review of Systems Review of Systems  Constitutional: Positive for activity change. Negative for appetite change, chills, diaphoresis, fatigue and fever.  HENT: Negative for congestion, rhinorrhea, sinus pressure and sore throat.   Eyes: Negative.   Respiratory: Positive for cough and shortness of breath. Negative for wheezing.   Cardiovascular: Positive for leg swelling. Negative for chest pain and palpitations.  Gastrointestinal: Positive for constipation. Negative for abdominal pain, blood in stool, diarrhea, nausea and vomiting.  Genitourinary: Negative for dysuria, flank pain, frequency, hematuria and urgency.  Musculoskeletal: Negative for back pain.  Skin: Negative.   Neurological: Positive for dizziness, weakness and light-headedness. Negative for syncope and headaches.     Physical Exam Updated Vital Signs LMP 10/10/1969   Physical Exam  Constitutional: She is oriented to person, place, and time. She appears well-developed and well-nourished. No distress.  HENT:  Head: Normocephalic and atraumatic.  Mouth/Throat:  Oropharynx is clear and moist.  Eyes: Conjunctivae are normal. Pupils are equal, round, and reactive to light.  Neck: Normal range of motion. Neck supple. No thyromegaly present.  Cardiovascular: Normal rate, regular rhythm, normal heart sounds and intact distal pulses.   Pulmonary/Chest: Effort normal and breath sounds normal. No respiratory distress. She has no wheezes. She has no rales.  Abdominal: Soft. Bowel sounds are normal. She exhibits no distension. There is no tenderness. There is no rebound and no guarding.  Lymphadenopathy:    She has no cervical adenopathy.  Neurological: She is alert and oriented to person, place, and time.  Skin: Skin is warm and dry. Capillary refill takes less than 2 seconds. She is not diaphoretic.  1+ pitting edema noted to lower extremities bilaterally up to the level of the shin, no calf tenderness  ED Treatments / Results  Labs (all labs ordered are listed, but only abnormal results are displayed) Labs Reviewed  COMPREHENSIVE METABOLIC PANEL - Abnormal; Notable for the following:       Result Value   Glucose, Bld 106 (*)    Creatinine, Ser 1.18 (*)    ALT 7 (*)    GFR calc non Af Amer 42 (*)    GFR calc Af Amer 48 (*)    All other components within normal limits  CBC WITH DIFFERENTIAL/PLATELET - Abnormal; Notable for the following:    Hemoglobin 10.5 (*)    HCT 35.3 (*)    MCH 24.6 (*)    MCHC 29.7 (*)    RDW 15.7 (*)    All other components within normal limits  BRAIN NATRIURETIC PEPTIDE - Abnormal; Notable for the following:    B Natriuretic Peptide 118.7 (*)    All other components within normal limits  POC OCCULT BLOOD, ED - Abnormal; Notable for the following:    Fecal Occult Bld POSITIVE (*)    All other components within normal limits  URINALYSIS, ROUTINE W REFLEX MICROSCOPIC (NOT AT [facility])  I-STAT TROPOININ, ED    EKG  EKG Interpretation  Date/Time:  Sunday October 22 2015 17:42:53 EDT Ventricular Rate:  66 PR  Interval:    QRS Duration: 95 QT Interval:  426 QTC Calculation: 447 R Axis:   -4 Text Interpretation:  Sinus rhythm Low voltage, precordial leads last ECG showed afib =- now normal sinus rhythm Nonspecific T wave abnormality Abnormal ekg Confirmed by Sabra Heck  MD, BRIAN ([id number]) on 10/22/2015 6:09:40 PM       Radiology Dg Chest 2 View  Result Date: 10/22/2015 CLINICAL DATA:  Shortness of breath.  Dyspnea for 2 weeks. EXAM: CHEST  2 VIEW COMPARISON:  08/13/2015 FINDINGS: Mild upper thoracic spondylosis. Large lung volumes, but no overt airway thickening. Heart size within normal EM limits for projection. Mild subsegmental atelectasis along the right major fissure. Mild aortic atherosclerotic calcification. IMPRESSION: 1. Subsegmental atelectasis along the right major fissure. 2. Large lung volumes but no overt airway thickening. 3. Thoracic spondylosis. Electronically Signed   By: Van Clines M.D.   On: 10/22/2015 18:47    Procedures Procedures (including critical care time)  Medications Ordered in ED Medications - No data to display   Initial Impression / Assessment and Plan / ED Course  I have reviewed the triage vital signs and the nursing notes.  Pertinent labs & imaging results that were available during my care of the patient were reviewed by me and considered in my medical decision making (see chart for details).  Clinical Course  Value Comment By Time   The patient is an 82 year old female, she has complained of shortness of breath ongoing over the last 2 weeks, the daughters who accompany the patient to the hospital given additional information stating that she has had chronic back pain, she was seen by her family doctor then referred to a orthopedist who then referred her to a neurosurgeon whose office was able to perform some steroid injections in her back. This was done within the last 2 weeks, it did not give any relief. The patient does not complain of back pain to me  however she does complain of some difficulty breathing which is been mild, persistent, not positional. She is unsure whether it is exertional and she is fairly labile in her daily activities living at an assisted care facility. The daughters note that she has  had some fatigue, some lightheadedness and according to their report their family doctor has been looking into a prior history of anemia. Review of the medical record shows that in June her hemoglobin had dropped from 12-9, this was repeated on August 2 and again the number was 9 hemoglobin. She denies blood in her stools, they have been giving stool cards to their doctor, they have brought 1 today and request that we tested here.  Mild edeam, soft non tender abdomen - clear lungs, no distress, no hypoxia or tachycardia Noemi Chapel, MD 08/13 1857  Total Protein: 7.2 (Reviewed) Doristine Devoid, PA-C 08/13 2029   Patient CXR unremarkable except for mild atelectasis. Lab remarkable for mild increased creatine and decreased GFR. Consistent with previous results. Patient to follow up with PCP. Patient anemic. Hgb 10.5 up from 9.0 2 weeks ago. Hemoccult positive. Patient needs to follow up with PCP. Reassuring that Hgb has increased in 2 weeks. Patient encouraged to follow up with PCP concerning mildly BNP. Low suspicion for CHF. Normal echo in March 2016 with normal EF of 55% and normal wall motion. No hypoxia or tachycardia noted. Troponin normal. Denies any cp. EKG normal. SOB and weakness likely due to anemia and being less active at home. Patient encouraged to increase daily activity. Patient verbalized understanding. Discussed plan of care with Dr. Sabra Heck. Patient in NAD and ready for discharge. Final Clinical Impressions(s) / ED Diagnoses   Final diagnoses:  Dyspnea  Anemia, unspecified anemia type    New Prescriptions New Prescriptions   No medications on file     Doristine Devoid, PA-C 10/22/15 2347    Noemi Chapel, MD 10/25/15  2142

## 2015-10-22 NOTE — ED Triage Notes (Signed)
Per EMS: Pt complaining of SOB x 1 weeks. Pt complaining of fatigue. Pt denies any chest pain. Pt states "I just dont feel right."

## 2015-10-22 NOTE — Discharge Instructions (Signed)
Please follow up with your PCP to have your anemia and kidney function rechecked within the next 1 week. Ambulate as much as possible.

## 2015-10-27 ENCOUNTER — Encounter: Payer: Self-pay | Admitting: Internal Medicine

## 2015-10-27 ENCOUNTER — Ambulatory Visit (INDEPENDENT_AMBULATORY_CARE_PROVIDER_SITE_OTHER): Payer: Medicare Other | Admitting: Internal Medicine

## 2015-10-27 VITALS — BP 130/70 | HR 68 | Temp 98.7°F | Ht 65.5 in | Wt 169.0 lb

## 2015-10-27 DIAGNOSIS — R5381 Other malaise: Secondary | ICD-10-CM | POA: Diagnosis not present

## 2015-10-27 DIAGNOSIS — F3110 Bipolar disorder, current episode manic without psychotic features, unspecified: Secondary | ICD-10-CM

## 2015-10-27 DIAGNOSIS — F319 Bipolar disorder, unspecified: Secondary | ICD-10-CM | POA: Diagnosis not present

## 2015-10-27 DIAGNOSIS — M549 Dorsalgia, unspecified: Secondary | ICD-10-CM | POA: Diagnosis not present

## 2015-10-27 DIAGNOSIS — D500 Iron deficiency anemia secondary to blood loss (chronic): Secondary | ICD-10-CM

## 2015-10-27 DIAGNOSIS — I10 Essential (primary) hypertension: Secondary | ICD-10-CM

## 2015-10-27 DIAGNOSIS — R195 Other fecal abnormalities: Secondary | ICD-10-CM

## 2015-10-27 DIAGNOSIS — G8929 Other chronic pain: Secondary | ICD-10-CM

## 2015-10-27 MED ORDER — TRAMADOL HCL 50 MG PO TABS: 50.0000 mg | ORAL_TABLET | Freq: Four times a day (QID) | ORAL | 2 refills | Status: DC | PRN

## 2015-10-27 NOTE — Patient Instructions (Signed)
GI consultation.  Next week as scheduled  Physical therapy  Follow-up psychiatry

## 2015-10-27 NOTE — Progress Notes (Signed)
Subjective:    Patient ID: Tracy Rodriguez, female    DOB: 06-03-1931, 82 y.o.   MRN: [id number]  HPI 82 year old patient who is seen today following a recent emergency room visit.  She has had the weakness and decreased energy level for several weeks.  She is scheduled to see GI next week due to new onset anemia.  The patient was seen in the emergency department 5 days ago and hematest positive stool.  Documented. Due to poor energy level.  She has become much less active.  She has been set up for physical therapy, but the patient felt this was to be outpatient physical therapy and initially declined.  She is agreeable to in-home PT She is accompanied by her daughter today who is concerned about worsening bipolar depression.  She has been seen by psychiatry in [place] in the past and has been on Seroquel. She remains on tramadol for chronic low back pain.  She has required an epidural.  Past Medical History:  Diagnosis Date  . Arthritis    "joints" (08/04/2014)  . Bipolar 1 disorder ([place])   . Cancer of right breast ([place])   . Chronic lower back pain   . Hyperlipemia   . Hypertension      Social History   Social History  . Marital status: Widowed    Spouse name: N/A  . Number of children: N/A  . Years of education: N/A   Occupational History  . Not on file.   Social History Main Topics  . Smoking status: Never Smoker  . Smokeless tobacco: Never Used  . Alcohol use No  . Drug use: No  . Sexual activity: No   Other Topics Concern  . Not on file   Social History Narrative  . No narrative on file    Past Surgical History:  Procedure Laterality Date  . BACK SURGERY    . BREAST BIOPSY Right   . BREAST RECONSTRUCTION Right   . CATARACT EXTRACTION W/ INTRAOCULAR LENS  IMPLANT, BILATERAL Bilateral   . EXCISIONAL HEMORRHOIDECTOMY    . [place]; 1973; 1985   "ruptured discs each time"  . MASTECTOMY Right    cancer  . PLACEMENT OF BREAST IMPLANTS  Bilateral    "had to take tissure out of left"  . TONSILLECTOMY    . VAGINAL HYSTERECTOMY      Family History  Problem Relation Age of Onset  . Stroke Mother   . Heart disease Father   . Alzheimer's disease Sister   . Leukemia Brother   . Arthritis Maternal Grandmother     Allergies  Allergen Reactions  . Codeine Anaphylaxis  . Sulfa Antibiotics Rash    Current Outpatient Prescriptions on File Prior to Visit  Medication Sig Dispense Refill  . atorvastatin (LIPITOR) 20 MG tablet Take 1 tablet (20 mg total) by mouth daily. 90 tablet 3  . diltiazem (DILACOR XR) 120 MG 24 hr capsule Take 1 capsule (120 mg total) by mouth daily. 90 capsule 3  . escitalopram (LEXAPRO) 10 MG tablet Take 1 tablet (10 mg total) by mouth daily. 90 tablet 1  . furosemide (LASIX) 40 MG tablet TAKE 1 TABLET BY MOUTH EVERY DAY 90 tablet 1  . gabapentin (NEURONTIN) 300 MG capsule Take 1 capsule (300 mg total) by mouth 2 (two) times daily. 180 capsule 3  . mirabegron ER (MYRBETRIQ) 25 MG TB24 tablet Take 1 tablet (25 mg total) by mouth daily. 30 tablet 3  .  QUEtiapine (SEROQUEL) 25 MG tablet Take 1 tablet (25 mg total) by mouth 2 (two) times daily. 180 tablet 1  . traZODone (DESYREL) 150 MG tablet TAKE 1 TABLET BY MOUTH EVERY NIGHT AT BEDTIME 90 tablet 2  . warfarin (COUMADIN) 5 MG tablet Take as directed by anticoagulation clinic (Patient taking differently: Take 2.5-5 mg by mouth See admin instructions. Take 1 tablet (5 mg) by mouth on Monday mornings, take 1/2 (2.5 mg) on all the other days of the week - in the morning - or as directed by anticoagulation clinic) 30 tablet 3   No current facility-administered medications on file prior to visit.     BP 130/70 (BP Location: Left Arm, Patient Position: Sitting, Cuff Size: Normal)   Pulse 68   Temp 98.7 F (37.1 C) (Oral)   Ht 5' 5.5" (1.664 m)   Wt 169 lb (76.7 kg)   LMP 10/10/1969   SpO2 98%   BMI 27.70 kg/m      Review of Systems  Constitutional:  Positive for activity change, appetite change and fatigue.  HENT: Negative for congestion, dental problem, hearing loss, rhinorrhea, sinus pressure, sore throat and tinnitus.   Eyes: Negative for pain, discharge and visual disturbance.  Respiratory: Negative for cough and shortness of breath.   Cardiovascular: Negative for chest pain, palpitations and leg swelling.  Gastrointestinal: Negative for abdominal distention, abdominal pain, blood in stool, constipation, diarrhea, nausea and vomiting.  Genitourinary: Negative for difficulty urinating, dysuria, flank pain, frequency, hematuria, pelvic pain, urgency, vaginal bleeding, vaginal discharge and vaginal pain.  Musculoskeletal: Positive for back pain. Negative for arthralgias, gait problem and joint swelling.  Skin: Negative for rash.  Neurological: Positive for weakness. Negative for dizziness, syncope, speech difficulty, numbness and headaches.  Hematological: Negative for adenopathy.  Psychiatric/Behavioral: Positive for confusion and decreased concentration. Negative for agitation, behavioral problems and dysphoric mood. The patient is not nervous/anxious.        Objective:   Physical Exam  Constitutional: She is oriented to person, place, and time. She appears well-developed and well-nourished.  Appears fatigued but in no acute distress  Wt Readings from Last 3 Encounters: 10/27/15 : 169 lb (76.7 kg) 10/11/15 : 177 lb 3.2 oz (80.4 kg) 07/03/15 : 183 lb (83 kg)  HENT:  Head: Normocephalic.  Right Ear: External ear normal.  Left Ear: External ear normal.  Mouth/Throat: Oropharynx is clear and moist.  Eyes: Conjunctivae and EOM are normal. Pupils are equal, round, and reactive to light.  Neck: Normal range of motion. Neck supple. No thyromegaly present.  Cardiovascular: Normal rate, regular rhythm, normal heart sounds and intact distal pulses.   Pulmonary/Chest: Effort normal and breath sounds normal.  Abdominal: Soft. Bowel sounds  are normal. She exhibits no mass. There is no tenderness.  Musculoskeletal: Normal range of motion.  Lymphadenopathy:    She has no cervical adenopathy.  Neurological: She is alert and oriented to person, place, and time.  Skin: Skin is warm and dry. No rash noted.  Psychiatric: She has a normal mood and affect. Her behavior is normal.          Assessment & Plan:   Anemia/hematest positive stool Weight loss.  GI consultation as scheduled next week Fatigue  History of bipolar depression.  Daughter requesting psychiatric follow-up  Chronic low back pain.  Refill tramadol  [name], MD

## 2015-11-01 ENCOUNTER — Telehealth: Payer: Self-pay | Admitting: *Deleted

## 2015-11-01 ENCOUNTER — Encounter: Payer: Self-pay | Admitting: Gastroenterology

## 2015-11-01 ENCOUNTER — Ambulatory Visit (INDEPENDENT_AMBULATORY_CARE_PROVIDER_SITE_OTHER): Payer: Medicare Other | Admitting: Gastroenterology

## 2015-11-01 VITALS — BP 112/68 | HR 78 | Ht 65.5 in | Wt 174.1 lb

## 2015-11-01 DIAGNOSIS — K5900 Constipation, unspecified: Secondary | ICD-10-CM

## 2015-11-01 DIAGNOSIS — R195 Other fecal abnormalities: Secondary | ICD-10-CM | POA: Diagnosis not present

## 2015-11-01 DIAGNOSIS — D649 Anemia, unspecified: Secondary | ICD-10-CM

## 2015-11-01 MED ORDER — NA SULFATE-K SULFATE-MG SULF 17.5-3.13-1.6 GM/177ML PO SOLN: 1.0000 | Freq: Once | ORAL | 0 refills | Status: AC

## 2015-11-01 NOTE — Telephone Encounter (Signed)
  11/01/2015   RE: Tracy Rodriguez DOB: May 23, 1931 MRN: [id number]   Dear  Dr Burnice Logan,    We have scheduled the above patient for an endoscopic procedure. Our records show that she is on anticoagulation therapy.   Please advise as to how long the patient may come off her therapy of Coumadin prior to the procedure, which is scheduled for 11/29/2015.  Please fax back/ or route the completed form to [facility] at [phone].   Sincerely,    Genella Mech, CMA AAMA

## 2015-11-01 NOTE — Patient Instructions (Signed)
You have been scheduled for an endoscopy and colonoscopy. Please follow the written instructions given to you at your visit today. Please pick up your prep supplies at the pharmacy within the next 1-3 days. If you use inhalers (even only as needed), please bring them with you on the day of your procedure. Your physician has requested that you go to www.startemmi.com and enter the access code given to you at your visit today. This web site gives a general overview about your procedure. However, you should still follow specific instructions given to you by our office regarding your preparation for the procedure.  You will be contaced by our office prior to your procedure for directions on holding your Coumadin/Warfarin.  If you do not hear from our office 1 week prior to your scheduled procedure, please call [phone] to discuss.  Use Dulcolax suppositories daily for 7 days  Use Miralax 1 capful twice a day

## 2015-11-02 ENCOUNTER — Other Ambulatory Visit: Payer: Self-pay | Admitting: Internal Medicine

## 2015-11-02 DIAGNOSIS — R5381 Other malaise: Secondary | ICD-10-CM

## 2015-11-02 DIAGNOSIS — R531 Weakness: Secondary | ICD-10-CM

## 2015-11-02 NOTE — Progress Notes (Signed)
Order for Home Health Physical Therapy put in EPIC.

## 2015-11-06 NOTE — Telephone Encounter (Signed)
Coumadin may be discontinued 4-5 days prior to procedure.  Lovenox bridging is not necessary We will contact patient and ask her to check an INR preprocedure

## 2015-11-06 NOTE — Telephone Encounter (Signed)
Left message for patient that she can hold her coumadin prior to her procedure and to contact Dr Truddie Hidden office about getting her INR checked pre-procedure

## 2015-11-07 ENCOUNTER — Other Ambulatory Visit: Payer: Self-pay | Admitting: Internal Medicine

## 2015-11-07 ENCOUNTER — Other Ambulatory Visit: Payer: Self-pay | Admitting: *Deleted

## 2015-11-07 DIAGNOSIS — F0390 Unspecified dementia without behavioral disturbance: Secondary | ICD-10-CM | POA: Diagnosis not present

## 2015-11-07 DIAGNOSIS — M1991 Primary osteoarthritis, unspecified site: Secondary | ICD-10-CM | POA: Diagnosis not present

## 2015-11-07 DIAGNOSIS — Z5181 Encounter for therapeutic drug level monitoring: Secondary | ICD-10-CM

## 2015-11-07 DIAGNOSIS — I10 Essential (primary) hypertension: Secondary | ICD-10-CM | POA: Diagnosis not present

## 2015-11-07 DIAGNOSIS — Z7901 Long term (current) use of anticoagulants: Secondary | ICD-10-CM | POA: Diagnosis not present

## 2015-11-07 DIAGNOSIS — R32 Unspecified urinary incontinence: Secondary | ICD-10-CM | POA: Diagnosis not present

## 2015-11-07 DIAGNOSIS — C50911 Malignant neoplasm of unspecified site of right female breast: Secondary | ICD-10-CM | POA: Diagnosis not present

## 2015-11-07 DIAGNOSIS — D649 Anemia, unspecified: Secondary | ICD-10-CM | POA: Diagnosis not present

## 2015-11-07 DIAGNOSIS — E785 Hyperlipidemia, unspecified: Secondary | ICD-10-CM | POA: Diagnosis not present

## 2015-11-07 DIAGNOSIS — G8929 Other chronic pain: Secondary | ICD-10-CM | POA: Diagnosis not present

## 2015-11-07 DIAGNOSIS — Z9181 History of falling: Secondary | ICD-10-CM | POA: Diagnosis not present

## 2015-11-07 DIAGNOSIS — M545 Low back pain: Secondary | ICD-10-CM | POA: Diagnosis not present

## 2015-11-07 DIAGNOSIS — Z79891 Long term (current) use of opiate analgesic: Secondary | ICD-10-CM | POA: Diagnosis not present

## 2015-11-07 DIAGNOSIS — F329 Major depressive disorder, single episode, unspecified: Secondary | ICD-10-CM | POA: Diagnosis not present

## 2015-11-07 NOTE — Progress Notes (Signed)
Spoke with daughter Andrey Farmer). Informed her of need for INR for patient this week and 1 day prior to Colonoscopy. Made appointments for both INR's and put orders in chart. Also let Beth know that patient needs to stop coumadin 4 days prior to colonoscopy.

## 2015-11-09 ENCOUNTER — Other Ambulatory Visit (INDEPENDENT_AMBULATORY_CARE_PROVIDER_SITE_OTHER): Payer: Medicare Other

## 2015-11-09 ENCOUNTER — Telehealth: Payer: Self-pay | Admitting: Internal Medicine

## 2015-11-09 DIAGNOSIS — D689 Coagulation defect, unspecified: Secondary | ICD-10-CM

## 2015-11-09 DIAGNOSIS — D649 Anemia, unspecified: Secondary | ICD-10-CM | POA: Diagnosis not present

## 2015-11-09 DIAGNOSIS — Z7901 Long term (current) use of anticoagulants: Secondary | ICD-10-CM | POA: Diagnosis not present

## 2015-11-09 DIAGNOSIS — I10 Essential (primary) hypertension: Secondary | ICD-10-CM | POA: Diagnosis not present

## 2015-11-09 DIAGNOSIS — M1991 Primary osteoarthritis, unspecified site: Secondary | ICD-10-CM | POA: Diagnosis not present

## 2015-11-09 DIAGNOSIS — F0390 Unspecified dementia without behavioral disturbance: Secondary | ICD-10-CM | POA: Diagnosis not present

## 2015-11-09 DIAGNOSIS — M545 Low back pain: Secondary | ICD-10-CM | POA: Diagnosis not present

## 2015-11-09 DIAGNOSIS — G8929 Other chronic pain: Secondary | ICD-10-CM | POA: Diagnosis not present

## 2015-11-09 LAB — PROTIME-INR
INR: 2.6 ratio — ABNORMAL HIGH (ref 0.8–1.0)
Prothrombin Time: 28.5 s — ABNORMAL HIGH (ref 9.6–13.1)

## 2015-11-09 NOTE — Telephone Encounter (Signed)
Tracy Rodriguez called to get verbal orders. Pt seen 8/29 for PT evaluation 8/29.  PT has already been set up.(strengthening, balance, endurance and fall prevention ) 2 wk /1 3 wk / 2 2 wk/ 2  [name] with [facility] states he called same day for orders for OT eval and home health aid. But he left message on triage line. I do not see request.   Would like verbal for OT evaluation Home health aid (bathing, ADL's)  2 wk /1  3 wk /2  Ok to leave message

## 2015-11-09 NOTE — Progress Notes (Signed)
Tracy Rodriguez    [id number]    1931/11/05  Primary Care Physician:KWIATKOWSKI,PETER [name], MD  Referring Physician: Marletta Lor, MD [place], [place] [address]  Chief complaint:  Heme positive stool  HPI: 82 year old female is here for evaluation of heme-positive stool. She was in ER with weakness and fatigue for several weeks and was noted to be heme positive with anemia, hemoglobin 10.5. She had normal hemoglobin of 12 in April 2017 and was noted to be decreased in June and subsequently on most recent check on 10/22/2015. Patient denies any melena, hematemesis or bright red blood per rectum. He has no specific GI complaints. No recent endoscopic evaluation anywhere.   Outpatient Encounter Prescriptions as of 11/01/2015  Medication Sig  . atorvastatin (LIPITOR) 20 MG tablet Take 1 tablet (20 mg total) by mouth daily.  Marland Kitchen diltiazem (DILACOR XR) 120 MG 24 hr capsule Take 1 capsule (120 mg total) by mouth daily.  . furosemide (LASIX) 40 MG tablet TAKE 1 TABLET BY MOUTH EVERY DAY  . gabapentin (NEURONTIN) 300 MG capsule Take 1 capsule (300 mg total) by mouth 2 (two) times daily.  . mirabegron ER (MYRBETRIQ) 25 MG TB24 tablet Take 1 tablet (25 mg total) by mouth daily.  . QUEtiapine (SEROQUEL) 25 MG tablet Take 1 tablet (25 mg total) by mouth 2 (two) times daily.  . traMADol (ULTRAM) 50 MG tablet Take 1 tablet (50 mg total) by mouth every 6 (six) hours as needed.  . traZODone (DESYREL) 150 MG tablet TAKE 1 TABLET BY MOUTH EVERY NIGHT AT BEDTIME  . warfarin (COUMADIN) 5 MG tablet Take as directed by anticoagulation clinic (Patient taking differently: Take 2.5-5 mg by mouth See admin instructions. Take 1 tablet (5 mg) by mouth on Monday mornings, take 1/2 (2.5 mg) on all the other days of the week - in the morning - or as directed by anticoagulation clinic)  . [DISCONTINUED] escitalopram (LEXAPRO) 10 MG tablet Take 1 tablet (10 mg total) by mouth daily.  .  [EXPIRED] Na Sulfate-K Sulfate-Mg Sulf (SUPREP BOWEL PREP KIT) 17.5-3.13-1.6 GM/180ML SOLN Take 1 kit by mouth once.   No facility-administered encounter medications on file as of 11/01/2015.     Allergies as of 11/01/2015 - Review Complete 11/01/2015  Allergen Reaction Noted  . Codeine Anaphylaxis 09/14/2012  . Sulfa antibiotics Rash 09/14/2012    Past Medical History:  Diagnosis Date  . Arthritis    "joints" (08/04/2014)  . Bipolar 1 disorder ([facility])   . Cancer of right breast ([facility])   . Chronic lower back pain   . Hyperlipemia   . Hypertension     Past Surgical History:  Procedure Laterality Date  . BACK SURGERY    . BREAST BIOPSY Right   . BREAST RECONSTRUCTION Right   . CATARACT EXTRACTION W/ INTRAOCULAR LENS  IMPLANT, BILATERAL Bilateral   . EXCISIONAL HEMORRHOIDECTOMY    . [place]; 1973; 1985   "ruptured discs each time"  . MASTECTOMY Right    cancer  . PLACEMENT OF BREAST IMPLANTS Bilateral    "had to take tissure out of left"  . TONSILLECTOMY    . VAGINAL HYSTERECTOMY      Family History  Problem Relation Age of Onset  . Stroke Mother   . Heart disease Father   . Alzheimer's disease Sister   . Leukemia Brother   . Arthritis Maternal Grandmother   . Breast cancer Daughter  Social History   Social History  . Marital status: Widowed    Spouse name: N/A  . Number of children: N/A  . Years of education: N/A   Occupational History  . Not on file.   Social History Main Topics  . Smoking status: Never Smoker  . Smokeless tobacco: Never Used  . Alcohol use No  . Drug use: No  . Sexual activity: No   Other Topics Concern  . Not on file   Social History Narrative  . No narrative on file      Review of systems: Review of Systems  Constitutional: Negative for fever and chills.  HENT: Negative.   Eyes: Negative for blurred vision.  Respiratory: Negative for cough, shortness of breath and wheezing.   Cardiovascular: Negative  for chest pain and palpitations.  Gastrointestinal: as per HPI Genitourinary: Negative for dysuria, urgency, frequency and hematuria.  Musculoskeletal: Positive for myalgias, back pain and joint pain.  Skin: Negative for itching and rash.  Neurological: Negative for dizziness, tremors, focal weakness, seizures and loss of consciousness.  Endo/Heme/Allergies: Positive for seasonal allergies.  Psychiatric/Behavioral: Positive for depression, negative suicidal ideas and hallucinations.  All other systems reviewed and are negative.   Physical Exam: Vitals:   11/01/15 0854  BP: 112/68  Pulse: 78   Body mass index is 28.54 kg/m. Gen:      No acute distress HEENT:  EOMI, sclera anicteric Neck:     No masses; no thyromegaly Lungs:    Clear to auscultation bilaterally; normal respiratory effort CV:         Regular rate and rhythm; no murmurs Abd:      + bowel sounds; soft, non-tender; no palpable masses, no distension Ext:    No edema; adequate peripheral perfusion Skin:      Warm and dry; no rash Neuro: alert and oriented x 3 Psych: normal mood and affect  Data Reviewed:  Reviewed chart in epic   Assessment and Plan/Recommendations:  83 year old female here with complaints of increased fatigue and weakness, and recent onset anemia with heme positive stool We'll schedule for EGD and colonoscopy for evaluation The risks and benefits as well as alternatives of endoscopic procedure(s) have been discussed and reviewed. All questions answered. The patient agrees to proceed. We'll discuss with prescribing physician regarding holding anticoagulation (Coumadin) Return as needed Greater than 50% of the time used for counseling as well as treatment plan and follow-up. She had multiple questions which were answered to her satisfaction  K. Denzil Magnuson , MD [phone] Mon-Fri 8a-5p [phone] after 5p, weekends, holidays  CC: Marletta Lor, MD

## 2015-11-10 ENCOUNTER — Telehealth: Payer: Self-pay | Admitting: Internal Medicine

## 2015-11-10 NOTE — Telephone Encounter (Signed)
Spoke to Tracy Rodriguez Told her results normal and that I would call daughter. Tracy Rodriguez verbalized understanding. Called Tracy Rodriguez's  daughter [name], told her Tracy Rodriguez/INR was therapeutic. No change in dosing, repeat in 4 weeks per Dr.K. [name] verbalized understanding.

## 2015-11-10 NOTE — Telephone Encounter (Signed)
PT INR was therapeutic/ No change in dosing Repeat.  4 weeks

## 2015-11-10 NOTE — Telephone Encounter (Signed)
Please see results of PT/INR from yesterday and advise.

## 2015-11-10 NOTE — Telephone Encounter (Signed)
Pt had coum check yesterday. Pt advised to call back today for instructions on what to do. Ok to leave message on daughter cell phone.

## 2015-11-10 NOTE — Telephone Encounter (Signed)
Called and left message on [name]'s personal voicemail with [facility], verbal orders given Pt seen 8/29 for PT evaluation 8/29.  PT has already been set up.(strengthening, balance, endurance and fall prevention ) 2 wk /1 3 wk / 2 2 wk/ 2 Also verbal orders for OT evaluation Home health aid (bathing, ADL's)  2 wk /1  3 wk /2 All orders okay per Dr.K. Any questions please call the office.

## 2015-11-12 ENCOUNTER — Other Ambulatory Visit: Payer: Self-pay | Admitting: Internal Medicine

## 2015-11-13 DIAGNOSIS — D649 Anemia, unspecified: Secondary | ICD-10-CM | POA: Diagnosis not present

## 2015-11-13 DIAGNOSIS — G8929 Other chronic pain: Secondary | ICD-10-CM | POA: Diagnosis not present

## 2015-11-13 DIAGNOSIS — M545 Low back pain: Secondary | ICD-10-CM | POA: Diagnosis not present

## 2015-11-13 DIAGNOSIS — F0390 Unspecified dementia without behavioral disturbance: Secondary | ICD-10-CM | POA: Diagnosis not present

## 2015-11-13 DIAGNOSIS — I10 Essential (primary) hypertension: Secondary | ICD-10-CM | POA: Diagnosis not present

## 2015-11-13 DIAGNOSIS — M1991 Primary osteoarthritis, unspecified site: Secondary | ICD-10-CM | POA: Diagnosis not present

## 2015-11-14 ENCOUNTER — Telehealth: Payer: Self-pay | Admitting: Internal Medicine

## 2015-11-14 DIAGNOSIS — D649 Anemia, unspecified: Secondary | ICD-10-CM | POA: Diagnosis not present

## 2015-11-14 DIAGNOSIS — F0390 Unspecified dementia without behavioral disturbance: Secondary | ICD-10-CM | POA: Diagnosis not present

## 2015-11-14 DIAGNOSIS — G8929 Other chronic pain: Secondary | ICD-10-CM | POA: Diagnosis not present

## 2015-11-14 DIAGNOSIS — M1991 Primary osteoarthritis, unspecified site: Secondary | ICD-10-CM | POA: Diagnosis not present

## 2015-11-14 DIAGNOSIS — M545 Low back pain: Secondary | ICD-10-CM | POA: Diagnosis not present

## 2015-11-14 DIAGNOSIS — I10 Essential (primary) hypertension: Secondary | ICD-10-CM | POA: Diagnosis not present

## 2015-11-14 NOTE — Telephone Encounter (Signed)
Diane with [facility] would like to have verbal orders for PT 2x a week for 4 weeks.

## 2015-11-14 NOTE — Telephone Encounter (Signed)
Spoke to [place] with [facility] asking for orders for Occupational Therapy not Physical Therapy. Verbal orders given for OT 2x's a week for 4 weeks okay per Dr.K. Diane verbalized understanding.

## 2015-11-15 ENCOUNTER — Other Ambulatory Visit: Payer: Self-pay | Admitting: General Practice

## 2015-11-15 ENCOUNTER — Telehealth: Payer: Self-pay | Admitting: Internal Medicine

## 2015-11-15 DIAGNOSIS — F0390 Unspecified dementia without behavioral disturbance: Secondary | ICD-10-CM | POA: Diagnosis not present

## 2015-11-15 DIAGNOSIS — G8929 Other chronic pain: Secondary | ICD-10-CM | POA: Diagnosis not present

## 2015-11-15 DIAGNOSIS — D649 Anemia, unspecified: Secondary | ICD-10-CM | POA: Diagnosis not present

## 2015-11-15 DIAGNOSIS — M545 Low back pain: Secondary | ICD-10-CM | POA: Diagnosis not present

## 2015-11-15 DIAGNOSIS — I10 Essential (primary) hypertension: Secondary | ICD-10-CM | POA: Diagnosis not present

## 2015-11-15 DIAGNOSIS — M1991 Primary osteoarthritis, unspecified site: Secondary | ICD-10-CM | POA: Diagnosis not present

## 2015-11-15 MED ORDER — DIPHENOXYLATE-ATROPINE 2.5-0.025 MG PO TABS: 1.0000 | ORAL_TABLET | Freq: Four times a day (QID) | ORAL | 0 refills | Status: DC | PRN

## 2015-11-15 MED ORDER — WARFARIN SODIUM 5 MG PO TABS: ORAL_TABLET | ORAL | 3 refills | Status: DC

## 2015-11-15 NOTE — Telephone Encounter (Signed)
Left detailed message on pt's daughter Andrey Farmer voicemail that Rx was called into pharmacy Lomotil for diarrhea. Any questions please call office.  Pt also notified and that I left message for her daughter. Pt verbalized understanding.

## 2015-11-15 NOTE — Telephone Encounter (Signed)
Pt is having another round of diarrhea Daughter states Dr Raliegh Ip has called in something for this before. Would like to know if he can do this agian.  Pt was just seen in the office 8/18.  [facility]/ [address]

## 2015-11-15 NOTE — Telephone Encounter (Signed)
Discussed with Dr.K that pt's daughter call and said pt  was having diarrhea again and would like Rx that was sent in before. Dr. Raliegh Ip said okay to call in Lomotil. Rx called into pharmacy.

## 2015-11-17 DIAGNOSIS — G8929 Other chronic pain: Secondary | ICD-10-CM | POA: Diagnosis not present

## 2015-11-17 DIAGNOSIS — F0390 Unspecified dementia without behavioral disturbance: Secondary | ICD-10-CM | POA: Diagnosis not present

## 2015-11-17 DIAGNOSIS — I10 Essential (primary) hypertension: Secondary | ICD-10-CM | POA: Diagnosis not present

## 2015-11-17 DIAGNOSIS — M1991 Primary osteoarthritis, unspecified site: Secondary | ICD-10-CM | POA: Diagnosis not present

## 2015-11-17 DIAGNOSIS — M545 Low back pain: Secondary | ICD-10-CM | POA: Diagnosis not present

## 2015-11-17 DIAGNOSIS — D649 Anemia, unspecified: Secondary | ICD-10-CM | POA: Diagnosis not present

## 2015-11-20 ENCOUNTER — Telehealth: Payer: Self-pay | Admitting: Internal Medicine

## 2015-11-20 DIAGNOSIS — I10 Essential (primary) hypertension: Secondary | ICD-10-CM | POA: Diagnosis not present

## 2015-11-20 DIAGNOSIS — F0390 Unspecified dementia without behavioral disturbance: Secondary | ICD-10-CM | POA: Diagnosis not present

## 2015-11-20 DIAGNOSIS — M1991 Primary osteoarthritis, unspecified site: Secondary | ICD-10-CM | POA: Diagnosis not present

## 2015-11-20 DIAGNOSIS — M545 Low back pain: Secondary | ICD-10-CM | POA: Diagnosis not present

## 2015-11-20 DIAGNOSIS — G8929 Other chronic pain: Secondary | ICD-10-CM | POA: Diagnosis not present

## 2015-11-20 DIAGNOSIS — D649 Anemia, unspecified: Secondary | ICD-10-CM | POA: Diagnosis not present

## 2015-11-20 NOTE — Telephone Encounter (Signed)
Beth states pt has had this same diarrhea for about a week now. The Lomotil sent to pharmacy is not working. Please advise.  [facility] [address]

## 2015-11-20 NOTE — Telephone Encounter (Signed)
Please see message and advise 

## 2015-11-20 NOTE — Telephone Encounter (Signed)
Increase present.  Lomotil dosing Clear liquid diet Align 1 tablet daily for one week  Office visit.  If unimproved

## 2015-11-20 NOTE — Telephone Encounter (Signed)
Spoke to [place], told her Dr.K said to increase Lomotil to one tablet every 3 hours, clear liquid diet and take Align OTC one tablet daily x 1 week if unimproved office visit. Beth verbalized understanding.

## 2015-11-21 DIAGNOSIS — M1991 Primary osteoarthritis, unspecified site: Secondary | ICD-10-CM | POA: Diagnosis not present

## 2015-11-21 DIAGNOSIS — M545 Low back pain: Secondary | ICD-10-CM | POA: Diagnosis not present

## 2015-11-21 DIAGNOSIS — G8929 Other chronic pain: Secondary | ICD-10-CM | POA: Diagnosis not present

## 2015-11-21 DIAGNOSIS — I10 Essential (primary) hypertension: Secondary | ICD-10-CM | POA: Diagnosis not present

## 2015-11-21 DIAGNOSIS — F0390 Unspecified dementia without behavioral disturbance: Secondary | ICD-10-CM | POA: Diagnosis not present

## 2015-11-21 DIAGNOSIS — D649 Anemia, unspecified: Secondary | ICD-10-CM | POA: Diagnosis not present

## 2015-11-22 ENCOUNTER — Telehealth: Payer: Self-pay | Admitting: Internal Medicine

## 2015-11-22 DIAGNOSIS — G8929 Other chronic pain: Secondary | ICD-10-CM | POA: Diagnosis not present

## 2015-11-22 DIAGNOSIS — M545 Low back pain: Secondary | ICD-10-CM | POA: Diagnosis not present

## 2015-11-22 DIAGNOSIS — D649 Anemia, unspecified: Secondary | ICD-10-CM | POA: Diagnosis not present

## 2015-11-22 DIAGNOSIS — I10 Essential (primary) hypertension: Secondary | ICD-10-CM | POA: Diagnosis not present

## 2015-11-22 DIAGNOSIS — F0390 Unspecified dementia without behavioral disturbance: Secondary | ICD-10-CM | POA: Diagnosis not present

## 2015-11-22 DIAGNOSIS — M1991 Primary osteoarthritis, unspecified site: Secondary | ICD-10-CM | POA: Diagnosis not present

## 2015-11-22 NOTE — Telephone Encounter (Signed)
Tracy Rodriguez with [facility] states he was unaware pt was taking the  diphenoxylate-atropine (LOMOTIL) 2.5-0.025 MG tablet When he checked this med, it came up with a level 2  warning and  has a narcotic in it. Pt has been very sleepy. Pt was taking 2 tabs/ 4 x a day  (instead of 1 every 3 hours)  Diarrhea has resolved. But now her bp has been running low. 104/56 (seated) Pt dizzy when stands up. Shanon Brow has advised pt to dc this med, encouraging plenty of water, use walker and careful transferring. Ok to call him if need to.

## 2015-11-23 DIAGNOSIS — M545 Low back pain: Secondary | ICD-10-CM | POA: Diagnosis not present

## 2015-11-23 DIAGNOSIS — G8929 Other chronic pain: Secondary | ICD-10-CM | POA: Diagnosis not present

## 2015-11-23 DIAGNOSIS — D649 Anemia, unspecified: Secondary | ICD-10-CM | POA: Diagnosis not present

## 2015-11-23 DIAGNOSIS — I10 Essential (primary) hypertension: Secondary | ICD-10-CM | POA: Diagnosis not present

## 2015-11-23 DIAGNOSIS — M1991 Primary osteoarthritis, unspecified site: Secondary | ICD-10-CM | POA: Diagnosis not present

## 2015-11-23 DIAGNOSIS — F0390 Unspecified dementia without behavioral disturbance: Secondary | ICD-10-CM | POA: Diagnosis not present

## 2015-11-23 NOTE — Telephone Encounter (Signed)
Dr.K, please see message below and advise if you want pt to come in for weakness?

## 2015-11-23 NOTE — Telephone Encounter (Signed)
Called and spoke to pt asked her how she is feeling today? Pt said weak. Asked pt if still having dizziness or diarrhea? Pt said no it has stopped. Told her okay good. Pt said she would like to see Dr.K due to weakness. Told pt let me discuss with Dr.K tomorrow due to out of office today and I will get back to you tomorrow. Told pt to rest and continue drinking plenty of water. Pt verbalized understanding.

## 2015-11-24 DIAGNOSIS — I10 Essential (primary) hypertension: Secondary | ICD-10-CM | POA: Diagnosis not present

## 2015-11-24 DIAGNOSIS — D649 Anemia, unspecified: Secondary | ICD-10-CM | POA: Diagnosis not present

## 2015-11-24 DIAGNOSIS — M1991 Primary osteoarthritis, unspecified site: Secondary | ICD-10-CM | POA: Diagnosis not present

## 2015-11-24 DIAGNOSIS — M545 Low back pain: Secondary | ICD-10-CM | POA: Diagnosis not present

## 2015-11-24 DIAGNOSIS — F0390 Unspecified dementia without behavioral disturbance: Secondary | ICD-10-CM | POA: Diagnosis not present

## 2015-11-24 DIAGNOSIS — G8929 Other chronic pain: Secondary | ICD-10-CM | POA: Diagnosis not present

## 2015-11-24 NOTE — Telephone Encounter (Signed)
OK for ROV if not improved today

## 2015-11-24 NOTE — Telephone Encounter (Signed)
Left message on Tracy Rodriguez pt's daughter personal voicemail. Told her I spoke to pt yesterday and she was still not feeling well and I discussed with Dr.K today and he would like to see pt if still unimproved. Please call office to schedule appt today. I also left message on pt's voicemail to call office.

## 2015-11-27 DIAGNOSIS — I10 Essential (primary) hypertension: Secondary | ICD-10-CM | POA: Diagnosis not present

## 2015-11-27 DIAGNOSIS — G8929 Other chronic pain: Secondary | ICD-10-CM | POA: Diagnosis not present

## 2015-11-27 DIAGNOSIS — F0390 Unspecified dementia without behavioral disturbance: Secondary | ICD-10-CM | POA: Diagnosis not present

## 2015-11-27 DIAGNOSIS — M1991 Primary osteoarthritis, unspecified site: Secondary | ICD-10-CM | POA: Diagnosis not present

## 2015-11-27 DIAGNOSIS — M545 Low back pain: Secondary | ICD-10-CM | POA: Diagnosis not present

## 2015-11-27 DIAGNOSIS — D649 Anemia, unspecified: Secondary | ICD-10-CM | POA: Diagnosis not present

## 2015-11-28 ENCOUNTER — Other Ambulatory Visit (INDEPENDENT_AMBULATORY_CARE_PROVIDER_SITE_OTHER): Payer: Medicare Other

## 2015-11-28 ENCOUNTER — Telehealth: Payer: Self-pay | Admitting: *Deleted

## 2015-11-28 DIAGNOSIS — I4891 Unspecified atrial fibrillation: Secondary | ICD-10-CM

## 2015-11-28 DIAGNOSIS — Z5181 Encounter for therapeutic drug level monitoring: Secondary | ICD-10-CM | POA: Diagnosis not present

## 2015-11-28 LAB — POCT INR: INR: 1

## 2015-11-28 NOTE — Telephone Encounter (Signed)
Pt presented to the office today to have INR rechecked prior to Colonoscopy tomorrow. Pt waiting in lab.Told Dr.K pt's INR is 1.0 asked him if okay for pt to proceed with procedure and when she can start medication warfarin again and when to return for repeat PT/INR?   Per Dr.K, okay to have procedure and to restart medication after procedure and recheck in 4-5 weeks with [facility].

## 2015-11-28 NOTE — Telephone Encounter (Signed)
Spoke to pt and grandson, told them can have procedure tomorrow and to restart medication after procedure complete and schedule appt with [facility] in 4-5 weeks to have PT/INR rechecked. Pt and grandson verbalized understanding.

## 2015-11-28 NOTE — Telephone Encounter (Signed)
Pt presented to office to have INR checked.  Asked pt if she is feeling better because she never returned my call or her daughter. Pt said she is not sure. Told pt if not feeling better by Monday please call and schedule appt. Pt verbalized understanding.

## 2015-11-29 ENCOUNTER — Encounter: Payer: Self-pay | Admitting: Gastroenterology

## 2015-11-29 ENCOUNTER — Ambulatory Visit (AMBULATORY_SURGERY_CENTER): Payer: Medicare Other | Admitting: Gastroenterology

## 2015-11-29 VITALS — BP 164/66 | HR 72 | Temp 98.0°F | Resp 11 | Ht 65.5 in | Wt 174.0 lb

## 2015-11-29 DIAGNOSIS — I10 Essential (primary) hypertension: Secondary | ICD-10-CM | POA: Diagnosis not present

## 2015-11-29 DIAGNOSIS — K2951 Unspecified chronic gastritis with bleeding: Secondary | ICD-10-CM | POA: Diagnosis not present

## 2015-11-29 DIAGNOSIS — K2970 Gastritis, unspecified, without bleeding: Secondary | ICD-10-CM | POA: Diagnosis not present

## 2015-11-29 DIAGNOSIS — D509 Iron deficiency anemia, unspecified: Secondary | ICD-10-CM

## 2015-11-29 DIAGNOSIS — D649 Anemia, unspecified: Secondary | ICD-10-CM | POA: Diagnosis not present

## 2015-11-29 DIAGNOSIS — R195 Other fecal abnormalities: Secondary | ICD-10-CM | POA: Diagnosis not present

## 2015-11-29 DIAGNOSIS — F319 Bipolar disorder, unspecified: Secondary | ICD-10-CM | POA: Diagnosis not present

## 2015-11-29 DIAGNOSIS — E669 Obesity, unspecified: Secondary | ICD-10-CM | POA: Diagnosis not present

## 2015-11-29 DIAGNOSIS — I4891 Unspecified atrial fibrillation: Secondary | ICD-10-CM | POA: Diagnosis not present

## 2015-11-29 MED ORDER — SODIUM CHLORIDE 0.9 % IV SOLN: 500.0000 mL | INTRAVENOUS | Status: DC

## 2015-11-29 NOTE — Op Note (Signed)
[name]: Tracy Rodriguez Procedure Date: 11/29/2015 2:31 PM MRN: [id number] Endoscopist: Mauri Pole , MD Age: 82 Referring MD:  Date of Birth: 1932/02/25 Gender: Female Account #: [id number] Procedure:                Upper GI endoscopy Indications:              Suspected upper gastrointestinal bleeding in                            patient with unexplained iron deficiency anemia Medicines:                Monitored Anesthesia Care Procedure:                Pre-Anesthesia Assessment:                           - Prior to the procedure, a History and Physical                            was performed, and patient medications and                            allergies were reviewed. The patient's tolerance of                            previous anesthesia was also reviewed. The risks                            and benefits of the procedure and the sedation                            options and risks were discussed with the patient.                            All questions were answered, and informed consent                            was obtained. Prior Anticoagulants: The patient has                            taken no previous anticoagulant or antiplatelet                            agents. ASA Grade Assessment: II - A patient with                            mild systemic disease. After reviewing the risks                            and benefits, the patient was deemed in                            satisfactory condition to undergo the procedure.  After obtaining informed consent, the endoscope was                            passed under direct vision. Throughout the                            procedure, the patient's blood pressure, pulse, and                            oxygen saturations were monitored continuously. The                            Model GIF-[id number]) scope was introduced                            through  the mouth, and advanced to the second part                            of duodenum. The upper GI endoscopy was                            accomplished without difficulty. The patient                            tolerated the procedure well. Scope In: Scope Out: Findings:                 The esophagus was normal.                           Diffuse moderate inflammation characterized by                            congestion (edema) and erythema was found in the                            entire examined stomach. Biopsies were taken with a                            cold forceps for Helicobacter pylori testing.                           The examined duodenum was normal. Complications:            No immediate complications. Estimated Blood Loss:     Estimated blood loss was minimal. Impression:               - Normal esophagus.                           - Gastritis. Biopsied.                           - Normal examined duodenum. Recommendation:           - Patient has a contact number available for  emergencies. The signs and symptoms of potential                            delayed complications were discussed with the                            patient. Return to normal activities tomorrow.                            Written discharge instructions were provided to the                            patient.                           - Resume previous diet.                           - Continue present medications.                           - Await pathology results.                           - No aspirin, ibuprofen, naproxen, or other                            non-steroidal anti-inflammatory drugs.                           - Resume Coumadin (warfarin) at prior dose today.                            Refer to Coumadin Clinic for further adjustment of                            therapy. Mauri Pole, MD 11/29/2015 3:25:23 PM This report has been signed  electronically.

## 2015-11-29 NOTE — Op Note (Addendum)
[facility] Patient Name: Tracy Rodriguez Procedure Date: 11/29/2015 2:30 PM MRN: [id number] Endoscopist: Mauri Pole , MD Age: 82 Referring MD:  Date of Birth: May 14, 1931 Gender: Female Account #: [id number] Procedure:                Colonoscopy Indications:              Unexplained iron deficiency anemia Medicines:                Monitored Anesthesia Care Procedure:                Pre-Anesthesia Assessment:                           - Prior to the procedure, a History and Physical                            was performed, and patient medications and                            allergies were reviewed. The patient's tolerance of                            previous anesthesia was also reviewed. The risks                            and benefits of the procedure and the sedation                            options and risks were discussed with the patient.                            All questions were answered, and informed consent                            was obtained. Prior Anticoagulants: The patient                            last took Coumadin (warfarin) 8 days prior to the                            procedure. After reviewing the risks and benefits,                            the patient was deemed in satisfactory condition to                            undergo the procedure.                           After obtaining informed consent, the colonoscope                            was passed under direct vision. Throughout the  procedure, the patient's blood pressure, pulse, and                            oxygen saturations were monitored continuously. The                            Model CF-[id number]) scope was introduced                            through the anus and advanced to the the cecum,                            identified by appendiceal orifice and ileocecal                            valve. The colonoscopy was performed  without                            difficulty. The patient tolerated the procedure                            well. The quality of the bowel preparation was                            adequate to identify polyps 6 mm and larger in size                            and unsatisfactory. The ileocecal valve,                            appendiceal orifice, and rectum were photographed. Scope In: 2:57:15 PM Scope Out: 3:16:00 PM Scope Withdrawal Time: 0 hours 11 minutes 13 seconds  Total Procedure Duration: 0 hours 18 minutes 45 seconds  Findings:                 The perianal and digital rectal examinations were                            normal. Poor prep but was able to visualize                            adequately to identify lesions >48mm after extensive                            lavage and suction. No gross lesions identified                           The exam was otherwise without abnormality. Complications:            No immediate complications. Estimated Blood Loss:     Estimated blood loss: none. Impression:               - Preparation of the colon was unsatisfactory.                           -  The examination was otherwise normal.                           - No specimens collected. Recommendation:           - Patient has a contact number available for                            emergencies. The signs and symptoms of potential                            delayed complications were discussed with the                            patient. Return to normal activities tomorrow.                            Written discharge instructions were provided to the                            patient.                           - Resume previous diet.                           - Continue present medications.                           - Resume Coumadin (warfarin) at prior dose today.                            Refer to Coumadin Clinic for further adjustment of                            therapy.                            - No repeat colonoscopy due to age.                           -Follow up Hgb in 4-6 weeks and if continues to be                            low with evidence of ongoing GI blood loss will                            consider small bowel video capsule for further                            evaluation                           - Return to GI clinic PRN. Mauri Pole, MD 11/29/2015 3:29:16 PM This report has been signed electronically.

## 2015-11-29 NOTE — Patient Instructions (Signed)
Biopsies taken in your stomach. Gastritis seen, handout given. Result letter in your mail in 2-3 weeks.  Resume current medications today. Resume coumadin today take as directed.  Please do not take Aspirin, ibuprofen, naproxen, or any other non-steriodal anti-inflammatory medications. Dr.Nandigam wants to recheck your blood levels in about 1-2 months. Call us with any questions or concerns. Thank you!   YOU HAD AN ENDOSCOPIC PROCEDURE TODAY AT [facility]:   Refer to the procedure report that was given to you for any specific questions about what was found during the examination.  If the procedure report does not answer your questions, please call your gastroenterologist to clarify.  If you requested that your care partner not be given the details of your procedure findings, then the procedure report has been included in a sealed envelope for you to review at your convenience later.  YOU SHOULD EXPECT: Some feelings of bloating in the abdomen. Passage of more gas than usual.  Walking can help get rid of the air that was put into your GI tract during the procedure and reduce the bloating. If you had a lower endoscopy (such as a colonoscopy or flexible sigmoidoscopy) you may notice spotting of blood in your stool or on the toilet paper. If you underwent a bowel prep for your procedure, you may not have a normal bowel movement for a few days.  Please Note:  You might notice some irritation and congestion in your nose or some drainage.  This is from the oxygen used during your procedure.  There is no need for concern and it should clear up in a day or so.  SYMPTOMS TO REPORT IMMEDIATELY:   Following lower endoscopy (colonoscopy or flexible sigmoidoscopy):  Excessive amounts of blood in the stool  Significant tenderness or worsening of abdominal pains  Swelling of the abdomen that is new, acute  Fever of 100F or higher   Following upper endoscopy (EGD)  Vomiting of blood or  coffee ground material  New chest pain or pain under the shoulder blades  Painful or persistently difficult swallowing  New shortness of breath  Fever of 100F or higher  Black, tarry-looking stools  For urgent or emergent issues, a gastroenterologist can be reached at any hour by calling [phone].   DIET:  We do recommend a small meal at first, but then you may proceed to your regular diet.  Drink plenty of fluids but you should avoid alcoholic beverages for 24 hours.  ACTIVITY:  You should plan to take it easy for the rest of today and you should NOT DRIVE or use heavy machinery until tomorrow (because of the sedation medicines used during the test).    FOLLOW UP: Our staff will call the number listed on your records the next business day following your procedure to check on you and address any questions or concerns that you may have regarding the information given to you following your procedure. If we do not reach you, we will leave a message.  However, if you are feeling well and you are not experiencing any problems, there is no need to return our call.  We will assume that you have returned to your regular daily activities without incident.  If any biopsies were taken you will be contacted by phone or by letter within the next 1-3 weeks.  Please call us at [phone] if you have not heard about the biopsies in 3 weeks.    SIGNATURES/CONFIDENTIALITY: You and/or your care  partner have signed paperwork which will be entered into your electronic medical record.  These signatures attest to the fact that that the information above on your After Visit Summary has been reviewed and is understood.  Full responsibility of the confidentiality of this discharge information lies with you and/or your care-partner.

## 2015-11-29 NOTE — Progress Notes (Signed)
Called to room to assist during endoscopic procedure.  Patient ID and intended procedure confirmed with present staff. Received instructions for my participation in the procedure from the performing physician.  

## 2015-11-30 ENCOUNTER — Other Ambulatory Visit: Payer: Self-pay

## 2015-11-30 ENCOUNTER — Telehealth: Payer: Self-pay | Admitting: *Deleted

## 2015-11-30 ENCOUNTER — Telehealth: Payer: Self-pay | Admitting: Internal Medicine

## 2015-11-30 DIAGNOSIS — F0390 Unspecified dementia without behavioral disturbance: Secondary | ICD-10-CM | POA: Diagnosis not present

## 2015-11-30 DIAGNOSIS — I10 Essential (primary) hypertension: Secondary | ICD-10-CM | POA: Diagnosis not present

## 2015-11-30 DIAGNOSIS — D649 Anemia, unspecified: Secondary | ICD-10-CM | POA: Diagnosis not present

## 2015-11-30 DIAGNOSIS — D500 Iron deficiency anemia secondary to blood loss (chronic): Secondary | ICD-10-CM

## 2015-11-30 DIAGNOSIS — M545 Low back pain: Secondary | ICD-10-CM | POA: Diagnosis not present

## 2015-11-30 DIAGNOSIS — G8929 Other chronic pain: Secondary | ICD-10-CM | POA: Diagnosis not present

## 2015-11-30 DIAGNOSIS — M1991 Primary osteoarthritis, unspecified site: Secondary | ICD-10-CM | POA: Diagnosis not present

## 2015-11-30 NOTE — Telephone Encounter (Signed)
Noted  

## 2015-11-30 NOTE — Telephone Encounter (Signed)
  Follow up Call-  Call back number 11/29/2015  Post procedure Call Back phone  # [phone]  Permission to leave phone message Yes  Some recent data might be hidden     Patient questions:  Do you have a fever, pain , or abdominal swelling? No. Pain Score  0 *  Have you tolerated food without any problems? Yes.    Have you been able to return to your normal activities? Yes.    Do you have any questions about your discharge instructions: Diet   No. Medications  No. Follow up visit  No.  Do you have questions or concerns about your Care? No.  Actions: * If pain score is 4 or above: No action needed, pain <4.

## 2015-11-30 NOTE — Telephone Encounter (Signed)
fyi Pt had colonoscopy on wed and pt missed Monday OT appt due to colonoscopy prep in advance.

## 2015-12-01 ENCOUNTER — Telehealth: Payer: Self-pay | Admitting: Internal Medicine

## 2015-12-01 DIAGNOSIS — D649 Anemia, unspecified: Secondary | ICD-10-CM | POA: Diagnosis not present

## 2015-12-01 DIAGNOSIS — F0390 Unspecified dementia without behavioral disturbance: Secondary | ICD-10-CM | POA: Diagnosis not present

## 2015-12-01 DIAGNOSIS — I10 Essential (primary) hypertension: Secondary | ICD-10-CM | POA: Diagnosis not present

## 2015-12-01 DIAGNOSIS — M545 Low back pain: Secondary | ICD-10-CM | POA: Diagnosis not present

## 2015-12-01 DIAGNOSIS — M1991 Primary osteoarthritis, unspecified site: Secondary | ICD-10-CM | POA: Diagnosis not present

## 2015-12-01 DIAGNOSIS — G8929 Other chronic pain: Secondary | ICD-10-CM | POA: Diagnosis not present

## 2015-12-01 NOTE — Telephone Encounter (Signed)
Tracy Rodriguez to advise that the patient missed her second OT appt today, citing that she still didn't feel well from the colonoscopy.

## 2015-12-04 DIAGNOSIS — I10 Essential (primary) hypertension: Secondary | ICD-10-CM | POA: Diagnosis not present

## 2015-12-04 DIAGNOSIS — D649 Anemia, unspecified: Secondary | ICD-10-CM | POA: Diagnosis not present

## 2015-12-04 DIAGNOSIS — F0390 Unspecified dementia without behavioral disturbance: Secondary | ICD-10-CM | POA: Diagnosis not present

## 2015-12-04 DIAGNOSIS — G8929 Other chronic pain: Secondary | ICD-10-CM | POA: Diagnosis not present

## 2015-12-04 DIAGNOSIS — M545 Low back pain: Secondary | ICD-10-CM | POA: Diagnosis not present

## 2015-12-04 DIAGNOSIS — M1991 Primary osteoarthritis, unspecified site: Secondary | ICD-10-CM | POA: Diagnosis not present

## 2015-12-05 ENCOUNTER — Emergency Department (HOSPITAL_COMMUNITY): Payer: Medicare Other

## 2015-12-05 ENCOUNTER — Encounter (HOSPITAL_COMMUNITY): Payer: Self-pay | Admitting: Emergency Medicine

## 2015-12-05 ENCOUNTER — Inpatient Hospital Stay (HOSPITAL_COMMUNITY)
Admission: EM | Admit: 2015-12-05 | Discharge: 2015-12-07 | DRG: 312 | Disposition: A | Discharge status: Home Health | Payer: Medicare Other | Attending: Internal Medicine | Admitting: Internal Medicine

## 2015-12-05 DIAGNOSIS — Z880 Allergy status to penicillin: Secondary | ICD-10-CM

## 2015-12-05 DIAGNOSIS — R55 Syncope and collapse: Secondary | ICD-10-CM | POA: Diagnosis not present

## 2015-12-05 DIAGNOSIS — Z23 Encounter for immunization: Secondary | ICD-10-CM

## 2015-12-05 DIAGNOSIS — Z79899 Other long term (current) drug therapy: Secondary | ICD-10-CM

## 2015-12-05 DIAGNOSIS — E860 Dehydration: Secondary | ICD-10-CM | POA: Diagnosis present

## 2015-12-05 DIAGNOSIS — E876 Hypokalemia: Secondary | ICD-10-CM | POA: Diagnosis present

## 2015-12-05 DIAGNOSIS — M1990 Unspecified osteoarthritis, unspecified site: Secondary | ICD-10-CM | POA: Diagnosis present

## 2015-12-05 DIAGNOSIS — I951 Orthostatic hypotension: Secondary | ICD-10-CM | POA: Diagnosis not present

## 2015-12-05 DIAGNOSIS — I071 Rheumatic tricuspid insufficiency: Secondary | ICD-10-CM | POA: Diagnosis not present

## 2015-12-05 DIAGNOSIS — Z853 Personal history of malignant neoplasm of breast: Secondary | ICD-10-CM

## 2015-12-05 DIAGNOSIS — J449 Chronic obstructive pulmonary disease, unspecified: Secondary | ICD-10-CM | POA: Diagnosis present

## 2015-12-05 DIAGNOSIS — I160 Hypertensive urgency: Secondary | ICD-10-CM | POA: Diagnosis present

## 2015-12-05 DIAGNOSIS — Z885 Allergy status to narcotic agent status: Secondary | ICD-10-CM

## 2015-12-05 DIAGNOSIS — E785 Hyperlipidemia, unspecified: Secondary | ICD-10-CM | POA: Diagnosis present

## 2015-12-05 DIAGNOSIS — M549 Dorsalgia, unspecified: Secondary | ICD-10-CM | POA: Diagnosis present

## 2015-12-05 DIAGNOSIS — R911 Solitary pulmonary nodule: Secondary | ICD-10-CM | POA: Diagnosis present

## 2015-12-05 DIAGNOSIS — N179 Acute kidney failure, unspecified: Secondary | ICD-10-CM | POA: Diagnosis present

## 2015-12-05 DIAGNOSIS — R5381 Other malaise: Secondary | ICD-10-CM

## 2015-12-05 DIAGNOSIS — I480 Paroxysmal atrial fibrillation: Secondary | ICD-10-CM | POA: Diagnosis present

## 2015-12-05 DIAGNOSIS — I482 Chronic atrial fibrillation: Secondary | ICD-10-CM | POA: Diagnosis present

## 2015-12-05 DIAGNOSIS — R404 Transient alteration of awareness: Secondary | ICD-10-CM | POA: Diagnosis not present

## 2015-12-05 DIAGNOSIS — S51011A Laceration without foreign body of right elbow, initial encounter: Secondary | ICD-10-CM | POA: Diagnosis not present

## 2015-12-05 DIAGNOSIS — R531 Weakness: Secondary | ICD-10-CM | POA: Diagnosis not present

## 2015-12-05 DIAGNOSIS — I110 Hypertensive heart disease with heart failure: Secondary | ICD-10-CM | POA: Diagnosis present

## 2015-12-05 DIAGNOSIS — I509 Heart failure, unspecified: Secondary | ICD-10-CM | POA: Diagnosis present

## 2015-12-05 DIAGNOSIS — Z888 Allergy status to other drugs, medicaments and biological substances status: Secondary | ICD-10-CM

## 2015-12-05 DIAGNOSIS — R42 Dizziness and giddiness: Secondary | ICD-10-CM | POA: Diagnosis not present

## 2015-12-05 DIAGNOSIS — Z7901 Long term (current) use of anticoagulants: Secondary | ICD-10-CM

## 2015-12-05 DIAGNOSIS — Z66 Do not resuscitate: Secondary | ICD-10-CM | POA: Diagnosis present

## 2015-12-05 DIAGNOSIS — K5900 Constipation, unspecified: Secondary | ICD-10-CM | POA: Diagnosis present

## 2015-12-05 DIAGNOSIS — N390 Urinary tract infection, site not specified: Secondary | ICD-10-CM | POA: Diagnosis present

## 2015-12-05 DIAGNOSIS — Z882 Allergy status to sulfonamides status: Secondary | ICD-10-CM

## 2015-12-05 DIAGNOSIS — W19XXXA Unspecified fall, initial encounter: Secondary | ICD-10-CM | POA: Diagnosis present

## 2015-12-05 DIAGNOSIS — I10 Essential (primary) hypertension: Secondary | ICD-10-CM

## 2015-12-05 DIAGNOSIS — G8929 Other chronic pain: Secondary | ICD-10-CM | POA: Diagnosis present

## 2015-12-05 DIAGNOSIS — R9389 Abnormal findings on diagnostic imaging of other specified body structures: Secondary | ICD-10-CM

## 2015-12-05 DIAGNOSIS — F319 Bipolar disorder, unspecified: Secondary | ICD-10-CM | POA: Diagnosis present

## 2015-12-05 HISTORY — DX: Paroxysmal atrial fibrillation: I48.0

## 2015-12-05 LAB — URINE MICROSCOPIC-ADD ON

## 2015-12-05 LAB — URINALYSIS, ROUTINE W REFLEX MICROSCOPIC
Bilirubin Urine: NEGATIVE
Glucose, UA: NEGATIVE mg/dL
Ketones, ur: NEGATIVE mg/dL
Nitrite: NEGATIVE
Protein, ur: NEGATIVE mg/dL
Specific Gravity, Urine: 1.018 (ref 1.005–1.030)
pH: 5.5 (ref 5.0–8.0)

## 2015-12-05 LAB — TSH: TSH: 1.615 u[IU]/mL (ref 0.350–4.500)

## 2015-12-05 LAB — BASIC METABOLIC PANEL
Anion gap: 9 (ref 5–15)
BUN: 18 mg/dL (ref 6–20)
CO2: 27 mmol/L (ref 22–32)
Calcium: 9.6 mg/dL (ref 8.9–10.3)
Chloride: 105 mmol/L (ref 101–111)
Creatinine, Ser: 1.35 mg/dL — ABNORMAL HIGH (ref 0.44–1.00)
GFR calc Af Amer: 41 mL/min — ABNORMAL LOW (ref >60)
GFR calc non Af Amer: 35 mL/min — ABNORMAL LOW (ref >60)
Glucose, Bld: 94 mg/dL (ref 65–99)
Potassium: 3.3 mmol/L — ABNORMAL LOW (ref 3.5–5.1)
Sodium: 141 mmol/L (ref 135–145)

## 2015-12-05 LAB — CBC
HCT: 37.5 % (ref 36.0–46.0)
Hemoglobin: 11 g/dL — ABNORMAL LOW (ref 12.0–15.0)
MCH: 23.9 pg — ABNORMAL LOW (ref 26.0–34.0)
MCHC: 29.3 g/dL — ABNORMAL LOW (ref 30.0–36.0)
MCV: 81.5 fL (ref 78.0–100.0)
Platelets: 257 10*3/uL (ref 150–400)
RBC: 4.6 MIL/uL (ref 3.87–5.11)
RDW: 17.9 % — ABNORMAL HIGH (ref 11.5–15.5)
WBC: 6.7 10*3/uL (ref 4.0–10.5)

## 2015-12-05 LAB — PROTIME-INR
INR: 1.03
Prothrombin Time: 13.5 seconds (ref 11.4–15.2)

## 2015-12-05 LAB — MAGNESIUM: Magnesium: 2.1 mg/dL (ref 1.7–2.4)

## 2015-12-05 LAB — PHOSPHORUS: Phosphorus: 2.4 mg/dL — ABNORMAL LOW (ref 2.5–4.6)

## 2015-12-05 LAB — CBG MONITORING, ED: Glucose-Capillary: 87 mg/dL (ref 65–99)

## 2015-12-05 MED ORDER — DOCUSATE SODIUM 100 MG PO CAPS
100.0000 mg | ORAL_CAPSULE | Freq: Two times a day (BID) | ORAL | Status: DC
Start: 2015-12-05 — End: 2015-12-07
  Administered 2015-12-05 – 2015-12-07 (×4): 100 mg via ORAL
  Filled 2015-12-05 (×4): qty 1

## 2015-12-05 MED ORDER — ONDANSETRON HCL 4 MG PO TABS: 4.0000 mg | ORAL_TABLET | Freq: Four times a day (QID) | ORAL | Status: DC | PRN

## 2015-12-05 MED ORDER — SODIUM CHLORIDE 0.9 % IV SOLN
INTRAVENOUS | Status: DC
  Administered 2015-12-05 – 2015-12-06 (×3): via INTRAVENOUS

## 2015-12-05 MED ORDER — WARFARIN SODIUM 5 MG PO TABS
5.0000 mg | ORAL_TABLET | Freq: Once | ORAL | Status: AC
  Administered 2015-12-05: 5 mg via ORAL
  Filled 2015-12-05: qty 1

## 2015-12-05 MED ORDER — TRAMADOL HCL 50 MG PO TABS
50.0000 mg | ORAL_TABLET | Freq: Four times a day (QID) | ORAL | Status: DC | PRN
  Administered 2015-12-05 – 2015-12-07 (×4): 50 mg via ORAL
  Filled 2015-12-05 (×4): qty 1

## 2015-12-05 MED ORDER — ATORVASTATIN CALCIUM 20 MG PO TABS
20.0000 mg | ORAL_TABLET | Freq: Every day | ORAL | Status: DC
  Administered 2015-12-06: 20 mg via ORAL
  Filled 2015-12-05 (×2): qty 1

## 2015-12-05 MED ORDER — TRAZODONE HCL 150 MG PO TABS
150.0000 mg | ORAL_TABLET | Freq: Every day | ORAL | Status: DC
  Administered 2015-12-05 – 2015-12-06 (×2): 150 mg via ORAL
  Filled 2015-12-05 (×2): qty 1

## 2015-12-05 MED ORDER — ESCITALOPRAM OXALATE 10 MG PO TABS
10.0000 mg | ORAL_TABLET | Freq: Every day | ORAL | Status: DC
  Administered 2015-12-05 – 2015-12-07 (×3): 10 mg via ORAL
  Filled 2015-12-05 (×3): qty 1

## 2015-12-05 MED ORDER — ONDANSETRON HCL 4 MG/2ML IJ SOLN: 4.0000 mg | Freq: Four times a day (QID) | INTRAMUSCULAR | Status: DC | PRN

## 2015-12-05 MED ORDER — ACETAMINOPHEN 325 MG PO TABS
650.0000 mg | ORAL_TABLET | Freq: Four times a day (QID) | ORAL | Status: DC | PRN
Start: 2015-12-05 — End: 2015-12-07
  Administered 2015-12-07: 650 mg via ORAL
  Filled 2015-12-05: qty 2

## 2015-12-05 MED ORDER — POLYETHYLENE GLYCOL 3350 17 G PO PACK: 17.0000 g | PACK | Freq: Every day | ORAL | Status: DC | PRN

## 2015-12-05 MED ORDER — POTASSIUM CHLORIDE CRYS ER 20 MEQ PO TBCR
40.0000 meq | EXTENDED_RELEASE_TABLET | Freq: Once | ORAL | Status: AC
  Administered 2015-12-05: 40 meq via ORAL
  Filled 2015-12-05: qty 2

## 2015-12-05 MED ORDER — WARFARIN - PHARMACIST DOSING INPATIENT
Freq: Every day | Status: DC
  Administered 2015-12-06 – 2015-12-07 (×2)

## 2015-12-05 MED ORDER — SODIUM CHLORIDE 0.9% FLUSH: 3.0000 mL | Freq: Two times a day (BID) | INTRAVENOUS | Status: DC

## 2015-12-05 MED ORDER — MIRABEGRON ER 25 MG PO TB24
25.0000 mg | ORAL_TABLET | Freq: Every day | ORAL | Status: DC
  Administered 2015-12-06 – 2015-12-07 (×2): 25 mg via ORAL
  Filled 2015-12-05 (×2): qty 1

## 2015-12-05 MED ORDER — DEXTROSE 5 % IV SOLN
1.0000 g | INTRAVENOUS | Status: DC
  Administered 2015-12-05 – 2015-12-06 (×2): 1 g via INTRAVENOUS
  Filled 2015-12-05 (×3): qty 10

## 2015-12-05 MED ORDER — SODIUM CHLORIDE 0.9 % IV BOLUS (SEPSIS)
250.0000 mL | Freq: Once | INTRAVENOUS | Status: AC
  Administered 2015-12-05: 250 mL via INTRAVENOUS

## 2015-12-05 MED ORDER — INFLUENZA VAC SPLIT QUAD 0.5 ML IM SUSY
0.5000 mL | PREFILLED_SYRINGE | INTRAMUSCULAR | Status: AC
  Administered 2015-12-06: 0.5 mL via INTRAMUSCULAR

## 2015-12-05 MED ORDER — SODIUM CHLORIDE 0.9 % IV SOLN: INTRAVENOUS | Status: AC

## 2015-12-05 MED ORDER — QUETIAPINE FUMARATE 25 MG PO TABS
25.0000 mg | ORAL_TABLET | Freq: Two times a day (BID) | ORAL | Status: DC
  Administered 2015-12-05 – 2015-12-07 (×3): 25 mg via ORAL
  Filled 2015-12-05 (×4): qty 1

## 2015-12-05 MED ORDER — DILTIAZEM HCL ER COATED BEADS 120 MG PO CP24
120.0000 mg | ORAL_CAPSULE | Freq: Every day | ORAL | Status: DC
  Administered 2015-12-05 – 2015-12-07 (×3): 120 mg via ORAL
  Filled 2015-12-05 (×3): qty 1

## 2015-12-05 MED ORDER — ACETAMINOPHEN 650 MG RE SUPP
650.0000 mg | Freq: Four times a day (QID) | RECTAL | Status: DC | PRN
Start: 2015-12-05 — End: 2015-12-07

## 2015-12-05 MED ORDER — GABAPENTIN 300 MG PO CAPS
300.0000 mg | ORAL_CAPSULE | Freq: Two times a day (BID) | ORAL | Status: DC
  Administered 2015-12-05 – 2015-12-07 (×4): 300 mg via ORAL
  Filled 2015-12-05 (×4): qty 1

## 2015-12-05 NOTE — Progress Notes (Signed)
ANTICOAGULATION CONSULT NOTE - Initial Consult  Pharmacy Consult for Warfarin Indication: atrial fibrillation  Allergies  Allergen Reactions  . Codeine Anaphylaxis  . Sulfa Antibiotics Rash  . Darvon [Propoxyphene]   . Floxin [Ofloxacin]   . Levsin [Hyoscyamine Sulfate]   . Penicillins   . Valium [Diazepam]   . Zantac [Ranitidine Hcl]     Patient Measurements: Height: 5\' 6"  (167.6 cm) Weight: 174 lb (78.9 kg) IBW/kg (Calculated) : 59.3   Vital Signs: Temp: 98 F (36.7 C) (09/26 0901) Temp Source: Oral (09/26 0901) BP: 171/65 (09/26 1300) Pulse Rate: 67 (09/26 1321)  Labs:  Recent Labs  12/05/15 1035  HGB 11.0*  HCT 37.5  PLT 257  LABPROT 13.5  INR 1.03  CREATININE 1.35*    Estimated Creatinine Clearance: 34 mL/min (by C-G formula based on SCr of 1.35 mg/dL (H)).   Medical History: Past Medical History:  Diagnosis Date  . Arthritis    "joints" (08/04/2014)  . Bipolar 1 disorder ([place])   . Cancer of right breast ([place])   . Chronic lower back pain   . Hyperlipemia   . Hypertension     Medications:   (Not in a hospital admission) Scheduled:   Infusions:  . sodium chloride 75 mL/hr at 12/05/15 1151    Assessment: 82yo female with history of Afib on warfarin PTA presents with Pharmacy is consulted to dose warfarin for atrial fibrillation. Pt recently held warfarin for GI procedure on 9/20.  PTA Warfarin Dose: 5mg  Mon and 2.5mg  AODs  Goal of Therapy:  INR 2-3 Monitor platelets by anticoagulation protocol: Yes   Plan:  Warfarin 5mg  tonight x1 Daily INR Monitor s/x of bleeding  Andrey Cota. Diona Foley, PharmD, [place] Clinical Pharmacist Pager [phone] 12/05/2015,2:05 PM

## 2015-12-05 NOTE — ED Provider Notes (Signed)
[facility] DEPT Provider Note   CSN: [id number] Arrival date & time: 12/05/15  0850     History   Chief Complaint Chief Complaint  Patient presents with  . Fall  . Loss of Consciousness    HPI Tracy Rodriguez is a 82 y.o. female.  Patient was syncopal episode brought in by EMS. Patient is at independent living center patient states she's passed out. Skin tear to her right elbow does not think that she hit her head. No specific complaints other than elbow pain. Past history of atrial fibrillation questionable whether she supposed to be on Coumadin or not. Patient is awake and alert currently.      Past Medical History:  Diagnosis Date  . Arthritis    "joints" (08/04/2014)  . Bipolar 1 disorder ([place])   . Cancer of right breast ([place])   . Chronic lower back pain   . Hyperlipemia   . Hypertension     Patient Active Problem List   Diagnosis Date Noted  . Syncope 12/05/2015  . Encounter for therapeutic drug monitoring 12/22/2014  . Chest pain at rest 08/04/2014  . Atrial fibrillation with RVR ([place]) 08/04/2014  . New onset atrial fibrillation ([place]) 08/04/2014  . Essential hypertension   . Obesity 09/27/2013  . Chronic back pain 09/14/2012  . Bipolar disorder ([place]) 09/14/2012    Past Surgical History:  Procedure Laterality Date  . BACK SURGERY    . BREAST BIOPSY Right   . BREAST RECONSTRUCTION Right   . CATARACT EXTRACTION W/ INTRAOCULAR LENS  IMPLANT, BILATERAL Bilateral   . EXCISIONAL HEMORRHOIDECTOMY    . [place]; 1973; 1985   "ruptured discs each time"  . MASTECTOMY Right    cancer  . PLACEMENT OF BREAST IMPLANTS Bilateral    "had to take tissure out of left"  . TONSILLECTOMY    . VAGINAL HYSTERECTOMY      OB History    No data available       Home Medications    Prior to Admission medications   Medication Sig Start Date End Date Taking? Authorizing Provider  atorvastatin (LIPITOR) 20 MG tablet Take 1 tablet (20 mg total) by  mouth daily. 05/15/15   Marletta Lor, MD  diltiazem (DILACOR XR) 120 MG 24 hr capsule Take 1 capsule (120 mg total) by mouth daily. 05/15/15   Marletta Lor, MD  diphenoxylate-atropine (LOMOTIL) 2.5-0.025 MG tablet Take 1 tablet by mouth 4 (four) times daily as needed for diarrhea or loose stools. 11/15/15   Marletta Lor, MD  escitalopram (LEXAPRO) 10 MG tablet TAKE 1 TABLET(10 MG) BY MOUTH DAILY 11/07/15   Marletta Lor, MD  furosemide (LASIX) 40 MG tablet TAKE 1 TABLET BY MOUTH EVERY DAY 07/10/15   Marletta Lor, MD  gabapentin (NEURONTIN) 300 MG capsule Take 1 capsule (300 mg total) by mouth 2 (two) times daily. 05/15/15   Marletta Lor, MD  mirabegron ER (MYRBETRIQ) 25 MG TB24 tablet Take 1 tablet (25 mg total) by mouth daily. 10/11/15   Marletta Lor, MD  QUEtiapine (SEROQUEL) 25 MG tablet Take 1 tablet (25 mg total) by mouth 2 (two) times daily. 08/18/15   Marletta Lor, MD  traMADol (ULTRAM) 50 MG tablet Take 1 tablet (50 mg total) by mouth every 6 (six) hours as needed. 10/27/15   Marletta Lor, MD  traZODone (DESYREL) 150 MG tablet TAKE 1 TABLET BY MOUTH EVERY NIGHT AT BEDTIME 08/15/15   Doretha Sou  Burnice Logan, MD  warfarin (COUMADIN) 5 MG tablet Take as directed by anticoagulation clinic. 11/15/15   Marletta Lor, MD    Family History Family History  Problem Relation Age of Onset  . Stroke Mother   . Heart disease Father   . Alzheimer's disease Sister   . Leukemia Brother   . Arthritis Maternal Grandmother   . Breast cancer Daughter     Social History Social History  Substance Use Topics  . Smoking status: Never Smoker  . Smokeless tobacco: Never Used  . Alcohol use No     Allergies   Codeine; Sulfa antibiotics; Darvon [propoxyphene]; Floxin [ofloxacin]; Levsin [hyoscyamine sulfate]; Penicillins; Valium [diazepam]; and Zantac [ranitidine hcl]   Review of Systems Review of Systems  Constitutional: Positive for fatigue.  HENT:  Negative for congestion.   Eyes: Negative for visual disturbance.  Respiratory: Negative for shortness of breath.   Cardiovascular: Negative for chest pain.  Gastrointestinal: Negative for abdominal pain.  Genitourinary: Negative for dysuria.  Musculoskeletal: Negative for back pain.  Neurological: Positive for light-headedness.  Hematological: Bruises/bleeds easily.     Physical Exam Updated Vital Signs BP 171/65   Pulse 67   Temp 98 F (36.7 C) (Oral)   Resp 14   Ht 5\' 6"  (1.676 m)   Wt 78.9 kg   LMP 10/10/1969   SpO2 99%   BMI 28.08 kg/m   Physical Exam  Constitutional: She appears well-developed and well-nourished.  HENT:  Head: Normocephalic and atraumatic.  Eyes: EOM are normal. Pupils are equal, round, and reactive to light.  Neck: Normal range of motion. Neck supple.  Cardiovascular: Normal rate, regular rhythm and normal heart sounds.   Pulmonary/Chest: Effort normal and breath sounds normal. No respiratory distress.  Abdominal: Soft. Bowel sounds are normal.  Musculoskeletal: Normal range of motion.  Neurological: She is alert. No cranial nerve deficit. She exhibits normal muscle tone. Coordination normal.  Skin: Skin is warm.  4 cm skin tear right elbow not deep no deep structures no bony tenderness. No significant swelling.  Nursing note and vitals reviewed.    ED Treatments / Results  Labs (all labs ordered are listed, but only abnormal results are displayed) Labs Reviewed  BASIC METABOLIC PANEL - Abnormal; Notable for the following:       Result Value   Potassium 3.3 (*)    Creatinine, Ser 1.35 (*)    GFR calc non Af Amer 35 (*)    GFR calc Af Amer 41 (*)    All other components within normal limits  CBC - Abnormal; Notable for the following:    Hemoglobin 11.0 (*)    MCH 23.9 (*)    MCHC 29.3 (*)    RDW 17.9 (*)    All other components within normal limits  PROTIME-INR  URINALYSIS, ROUTINE W REFLEX MICROSCOPIC (NOT AT [facility])  CBG MONITORING,  ED  CBG MONITORING, ED    EKG  EKG Interpretation  Date/Time:  Tuesday December 05 2015 08:56:02 EDT Ventricular Rate:  58 PR Interval:    QRS Duration: 89 QT Interval:  450 QTC Calculation: 442 R Axis:   -19 Text Interpretation:  Sinus rhythm Borderline left axis deviation RSR' in V1 or V2, right VCD or RVH Nonspecific T abnormalities, lateral leads No significant change since last tracing Confirmed by Vincenza Dail  MD, Ferman Basilio [phone]) on 12/05/2015 9:06:05 AM Also confirmed by Rogene Houston  MD, Alexzandra Bilton [phone]), editor Rolla Plate, Joelene Millin [phone])  on 12/05/2015 9:27:19 AM  Radiology Ct Head Wo Contrast  Result Date: 12/05/2015 CLINICAL DATA:  Episode of dizziness with fall. Transient loss of consciousness. EXAM: CT HEAD WITHOUT CONTRAST TECHNIQUE: Contiguous axial images were obtained from the base of the skull through the vertex without intravenous contrast. COMPARISON:  None. FINDINGS: Brain: There is age related volume loss. There is no intracranial mass, hemorrhage, extra-axial fluid collection, or midline shift. There is minimal small vessel disease in the centra semiovale bilaterally. Elsewhere gray-white compartments appear normal. No acute infarct evident. Vascular: There is no demonstrable hyperdense vessel. There is mild calcification in the distal left vertebral artery. There is also calcification in the right carotid siphon. Skull: The bony calvarium appears intact. Sinuses/Orbits: There is mild mucosal thickening in several ethmoid air cells bilaterally. Visualized paranasal sinuses elsewhere appear unremarkable. Orbits appear symmetric and unremarkable bilaterally. Other: Mastoid air cells are clear. IMPRESSION: Age related volume loss with rather minimal periventricular small vessel disease. No intracranial mass, hemorrhage, or focal acute appearing infarct. Areas of arterial vascular calcification. Mild ethmoid sinus disease bilaterally. Electronically Signed   By: Lowella Grip III  M.D.   On: 12/05/2015 10:06   Dg Chest Port 1 View  Result Date: 12/05/2015 CLINICAL DATA:  Syncope today.  History of hypertension. EXAM: PORTABLE CHEST 1 VIEW COMPARISON:  Chest x-rays dated 10/22/2015, 08/13/2015 01/16/2015. FINDINGS: Today's study is slightly hypoinspiratory compared to the previous exams, however, previous exams showed lungs to be hyperexpanded suggesting COPD. There is a questionable new density within the upper right lung. Heart size remains normal. Osseous structures about the chest are unremarkable. IMPRESSION: 1. Questionable new density within the upper right lung. Developing neoplastic nodule cannot be excluded. Recommend chest CT for further characterization. 2. COPD/emphysema. Electronically Signed   By: Franki Cabot M.D.   On: 12/05/2015 09:41    Procedures Procedures (including critical care time)  Medications Ordered in ED Medications  0.9 %  sodium chloride infusion ( Intravenous New Bag/Given 12/05/15 1151)  sodium chloride 0.9 % bolus 250 mL (0 mLs Intravenous Stopped 12/05/15 1150)     Initial Impression / Assessment and Plan / ED Course  I have reviewed the triage vital signs and the nursing notes.  Pertinent labs & imaging results that were available during my care of the patient were reviewed by me and considered in my medical decision making (see chart for details).  Clinical Course    Patient with unexplained syncopal episode. Skin tear to the right elbow no other significant injuries. Questionable whether she hit her head or not. Head CT negative. Chest x-ray showed evidence of a pulmonary nodule which will require workup in the future. No evidence of any significant lab abnormalities or anemia. There was concern from family members that she could've had anemia. Has been experiencing some lightheadedness. This could be orthostatic hypotension. However vitals have not been checked. Patient improved with IV fluids here. Contact the hospitalist team  they will admit her for cardiac monitoring and further workup. Urinalysis is still pending.  Final Clinical Impressions(s) / ED Diagnoses   Final diagnoses:  Syncope, unspecified syncope type  Skin tear of elbow without complication, right, initial encounter    New Prescriptions New Prescriptions   No medications on file     Fredia Sorrow, MD 12/05/15 1405

## 2015-12-05 NOTE — ED Notes (Signed)
MD at bedside. 

## 2015-12-05 NOTE — ED Notes (Signed)
Patient transported to CT 

## 2015-12-05 NOTE — H&P (Signed)
History and Physical    Tracy Rodriguez [id number] DOB: [birth year] DOA: 12/05/2015   PCP: [name], MD   Patient coming from/Resides with: [facility]  Admission status:  Observation/telemetry  Chief Complaint: Witnessed syncope with loss of consciousness  HPI: Tracy Rodriguez is a 82 y.o. female with medical history significant for chronic atrial fibrillation on warfarin, hypertension, bipolar disorder, chronic back pain, physical deconditioning with PT in place at home. According to the daughter who is assisting with the history, patient has been having some mild dizziness and physical deconditioning for about 3-4 months and is undergoing PT treatments at home. Patient was more dizzy today and when she stood she collapsed with apparent loss of consciousness. Upon awakening patient did not have any focal neurological deficits. Of note the patient recently stopped her Coumadin because she thought it was contributing to her dizziness. She did not notify the ordering physician of this change in her medication. Of note patient underwent colonoscopy recently with bowel prep and since Marcello Moores had more frequent dizzy episodes. She also was complaining of issues related to severe recurrent constipation.  ED Course:  Vital Signs: BP 171/65   Pulse 67   Temp 98 F (36.7 C) (Oral)   Resp 14   Ht 5\' 6"  (1.676 m)   Wt 78.9 kg (174 lb)   LMP 10/10/1969   SpO2 99%   BMI 28.08 kg/m  PCXR: Questionable new density within the right upper lung with CT of the chest recommended for further characterization; changes consistent with COPD CT of head without contrast: No acute changes Lab data: Sodium 141, potassium 3.3, chloride 105, CO2 27, BUN 18, creatinine 1.35, anion gap 9, WBCs 6700, hemoglobin 11, platelets 257,000, INR 1.3, glucose 94; urinalysis is abnormal with turbid appearance, many bacteria, trace hemoglobin, large leukocytes, nitrate negative,  squamous epithelials too numerous to count and PVCs to numerous to count Medications and treatments: NS bolus 250 mL 1  Review of Systems:  In addition to the HPI above,  No Fever-chills, myalgias or other constitutional symptoms No Headache, changes with Vision or hearing, new weakness, tingling, numbness in any extremity, dysarthria or word finding difficulty, tremors or seizure activity No problems swallowing food or Liquids, indigestion/reflux, choking or coughing while eating, abdominal pain with or after eating No Chest pain, Cough or Shortness of Breath, palpitations, orthopnea or DOE No Abdominal pain, N/V, melena,hematochezia, dark tarry stools, constipation No dysuria, malodorous urine, hematuria or flank pain No new skin rashes, lesions, masses or bruises, No new joint pains, aches, swelling or redness No recent unintentional weight gain or loss No polyuria, polydypsia or polyphagia   Past Medical History:  Diagnosis Date  . Arthritis    "joints" (08/04/2014)  . Bipolar 1 disorder ([place])   . Cancer of right breast ([place])   . Chronic lower back pain   . Hyperlipemia   . Hypertension     Past Surgical History:  Procedure Laterality Date  . BACK SURGERY    . BREAST BIOPSY Right   . BREAST RECONSTRUCTION Right   . CATARACT EXTRACTION W/ INTRAOCULAR LENS  IMPLANT, BILATERAL Bilateral   . EXCISIONAL HEMORRHOIDECTOMY    . [place]; 1973; 1985   "ruptured discs each time"  . MASTECTOMY Right    cancer  . PLACEMENT OF BREAST IMPLANTS Bilateral    "had to take tissure out of left"  . TONSILLECTOMY    . VAGINAL HYSTERECTOMY  Social History   Social History  . Marital status: Widowed    Spouse name: N/A  . Number of children: N/A  . Years of education: N/A   Occupational History  . Not on file.   Social History Main Topics  . Smoking status: Never Smoker  . Smokeless tobacco: Never Used  . Alcohol use No  . Drug use: No  . Sexual  activity: No   Other Topics Concern  . Not on file   Social History Narrative  . No narrative on file    Mobility: Rolling walker and cane Work history: Not obtained   Allergies  Allergen Reactions  . Codeine Anaphylaxis  . Sulfa Antibiotics Rash  . Darvon [Propoxyphene]   . Floxin [Ofloxacin]   . Levsin [Hyoscyamine Sulfate]   . Penicillins   . Valium [Diazepam]   . Zantac [Ranitidine Hcl]     Family History  Problem Relation Age of Onset  . Stroke Mother   . Heart disease Father   . Alzheimer's disease Sister   . Leukemia Brother   . Arthritis Maternal Grandmother   . Breast cancer Daughter       Prior to Admission medications   Medication Sig Start Date End Date Taking? Authorizing Provider  atorvastatin (LIPITOR) 20 MG tablet Take 1 tablet (20 mg total) by mouth daily. 05/15/15   Marletta Lor, MD  diltiazem (DILACOR XR) 120 MG 24 hr capsule Take 1 capsule (120 mg total) by mouth daily. 05/15/15   Marletta Lor, MD  diphenoxylate-atropine (LOMOTIL) 2.5-0.025 MG tablet Take 1 tablet by mouth 4 (four) times daily as needed for diarrhea or loose stools. 11/15/15   Marletta Lor, MD  escitalopram (LEXAPRO) 10 MG tablet TAKE 1 TABLET(10 MG) BY MOUTH DAILY 11/07/15   Marletta Lor, MD  furosemide (LASIX) 40 MG tablet TAKE 1 TABLET BY MOUTH EVERY DAY 07/10/15   Marletta Lor, MD  gabapentin (NEURONTIN) 300 MG capsule Take 1 capsule (300 mg total) by mouth 2 (two) times daily. 05/15/15   Marletta Lor, MD  mirabegron ER (MYRBETRIQ) 25 MG TB24 tablet Take 1 tablet (25 mg total) by mouth daily. 10/11/15   Marletta Lor, MD  QUEtiapine (SEROQUEL) 25 MG tablet Take 1 tablet (25 mg total) by mouth 2 (two) times daily. 08/18/15   Marletta Lor, MD  traMADol (ULTRAM) 50 MG tablet Take 1 tablet (50 mg total) by mouth every 6 (six) hours as needed. 10/27/15   Marletta Lor, MD  traZODone (DESYREL) 150 MG tablet TAKE 1 TABLET BY MOUTH EVERY NIGHT  AT BEDTIME 08/15/15   Marletta Lor, MD  warfarin (COUMADIN) 5 MG tablet Take as directed by anticoagulation clinic. 11/15/15   Marletta Lor, MD    Physical Exam: Vitals:   12/05/15 1400 12/05/15 1415 12/05/15 1430 12/05/15 1445  BP: 174/75  144/64   Pulse: 64 65 65 63  Resp: 19 14 17 13   Temp:      TempSrc:      SpO2: 98% 96% 99% 97%  Weight:      Height:          Constitutional: NAD, calm, comfortable Eyes: PERRL, lids and conjunctivae normal ENMT: Mucous membranes are dry. Posterior pharynx clear of any exudate or lesions.Normal dentition.  Neck: normal, supple, no masses, no thyromegaly Respiratory: clear to auscultation bilaterally, no wheezing, no crackles. Normal respiratory effort. No accessory muscle use.  Cardiovascular: Regular rate and rhythm, no murmurs /  rubs / gallops. No extremity edema. 2+ pedal pulses. No carotid bruits.  Abdomen: no tenderness, no masses palpated. No hepatosplenomegaly. Bowel sounds positive.  Musculoskeletal: no clubbing / cyanosis. No joint deformity upper and lower extremities. Good ROM, no contractures. Normal muscle tone.  Skin: no rashes, lesions, ulcers. No induration Neurologic: CN 2-12 grossly intact. Sensation intact, DTR normal. Strength 5/5 x all 4 extremities.  Psychiatric: Normal judgment and insight although seems to have some mild short-term memory deficits and had to be assisted by her daughter to complete the history portion of the exam. Alert and oriented x name and place. Normal mood with mildly flat affect although when prompted with smile appropriately.    Labs on Admission: I have personally reviewed following labs and imaging studies  CBC:  Recent Labs Lab 12/05/15 1035  WBC 6.7  HGB 11.0*  HCT 37.5  MCV 81.5  PLT [id number]   Basic Metabolic Panel:  Recent Labs Lab 12/05/15 1035  NA 141  K 3.3*  CL 105  CO2 27  GLUCOSE 94  BUN 18  CREATININE 1.35*  CALCIUM 9.6   GFR: Estimated Creatinine  Clearance: 34 mL/min (by C-G formula based on SCr of 1.35 mg/dL (H)). Liver Function Tests: No results for input(s): AST, ALT, ALKPHOS, BILITOT, PROT, ALBUMIN in the last 168 hours. No results for input(s): LIPASE, AMYLASE in the last 168 hours. No results for input(s): AMMONIA in the last 168 hours. Coagulation Profile:  Recent Labs Lab 12/05/15 1035  INR 1.03   Cardiac Enzymes: No results for input(s): CKTOTAL, CKMB, CKMBINDEX, TROPONINI in the last 168 hours. BNP (last 3 results)  Recent Labs  01/16/15 1411  PROBNP 276.0*   HbA1C: No results for input(s): HGBA1C in the last 72 hours. CBG:  Recent Labs Lab 12/05/15 0858  GLUCAP 87   Lipid Profile: No results for input(s): CHOL, HDL, LDLCALC, TRIG, CHOLHDL, LDLDIRECT in the last 72 hours. Thyroid Function Tests: No results for input(s): TSH, T4TOTAL, FREET4, T3FREE, THYROIDAB in the last 72 hours. Anemia Panel: No results for input(s): VITAMINB12, FOLATE, FERRITIN, TIBC, IRON, RETICCTPCT in the last 72 hours. Urine analysis:    Component Value Date/Time   COLORURINE YELLOW 12/05/2015 1338   APPEARANCEUR TURBID (A) 12/05/2015 1338   LABSPEC 1.018 12/05/2015 1338   PHURINE 5.5 12/05/2015 1338   GLUCOSEU NEGATIVE 12/05/2015 1338   HGBUR TRACE (A) 12/05/2015 1338   BILIRUBINUR NEGATIVE 12/05/2015 1338   BILIRUBINUR n 07/03/2015 1457   KETONESUR NEGATIVE 12/05/2015 1338   PROTEINUR NEGATIVE 12/05/2015 1338   UROBILINOGEN 1.0 07/03/2015 1457   UROBILINOGEN 0.2 08/04/2014 0657   NITRITE NEGATIVE 12/05/2015 1338   LEUKOCYTESUR LARGE (A) 12/05/2015 1338   Sepsis Labs: @LABRCNTIP (procalcitonin:4,lacticidven:4) )No results found for this or any previous visit (from the past 240 hour(s)).   Radiological Exams on Admission: Ct Head Wo Contrast  Result Date: 12/05/2015 CLINICAL DATA:  Episode of dizziness with fall. Transient loss of consciousness. EXAM: CT HEAD WITHOUT CONTRAST TECHNIQUE: Contiguous axial images were  obtained from the base of the skull through the vertex without intravenous contrast. COMPARISON:  None. FINDINGS: Brain: There is age related volume loss. There is no intracranial mass, hemorrhage, extra-axial fluid collection, or midline shift. There is minimal small vessel disease in the centra semiovale bilaterally. Elsewhere gray-white compartments appear normal. No acute infarct evident. Vascular: There is no demonstrable hyperdense vessel. There is mild calcification in the distal left vertebral artery. There is also calcification in the right carotid siphon. Skull: The  bony calvarium appears intact. Sinuses/Orbits: There is mild mucosal thickening in several ethmoid air cells bilaterally. Visualized paranasal sinuses elsewhere appear unremarkable. Orbits appear symmetric and unremarkable bilaterally. Other: Mastoid air cells are clear. IMPRESSION: Age related volume loss with rather minimal periventricular small vessel disease. No intracranial mass, hemorrhage, or focal acute appearing infarct. Areas of arterial vascular calcification. Mild ethmoid sinus disease bilaterally. Electronically Signed   By: Lowella Grip III M.D.   On: 12/05/2015 10:06   Dg Chest Port 1 View  Result Date: 12/05/2015 CLINICAL DATA:  Syncope today.  History of hypertension. EXAM: PORTABLE CHEST 1 VIEW COMPARISON:  Chest x-rays dated 10/22/2015, 08/13/2015 01/16/2015. FINDINGS: Today's study is slightly hypoinspiratory compared to the previous exams, however, previous exams showed lungs to be hyperexpanded suggesting COPD. There is a questionable new density within the upper right lung. Heart size remains normal. Osseous structures about the chest are unremarkable. IMPRESSION: 1. Questionable new density within the upper right lung. Developing neoplastic nodule cannot be excluded. Recommend chest CT for further characterization. 2. COPD/emphysema. Electronically Signed   By: Franki Cabot M.D.   On: 12/05/2015 09:41     EKG: (Independently reviewed) sinus bradycardia with ventricular rate 58 bpm, QTC 442 ms, no acute ischemic changes  Assessment/Plan Principal Problem:   Syncope -Suspect related to dehydration and possibly side effects from chronic medications; also concerned that patient may still be somewhat volume depleted after recent bowel prep for colonoscopy -Hold diuretic -Repeat orthostatic vital signs in a.m. -PT evaluation -Echocardiogram -No focal neurological deficits so no indication for MRI or additional neurological evaluation  Active Problems:   Acute kidney injury/Dehydration, moderate -Gentle IV fluid hydration and hold diuretic -Repeat labs in a.m. -Baseline renal function 17/1.18 -Current renal function 18/1.35    Abnormal urinalysis -Appears consistent with UTI so start empiric Rocephin -Check urine culture    Abnormal chest x-ray -Questionable new density within the upper right lung with a diet developing neoplastic nodule unable to be excluded -Radiology recommended CT for further characterization that given acute kidney injury will hold on CT now and have a follow-up 2 view chest x-ray in a.m. -Patient is not hypoxemic and denies history of tobacco abuse    Chronic atrial fibrillation  -Currently maintaining sinus bradycardia -Continue diltiazem -Patient has not been taking warfarin for greater than 3 days because she felt this medication was causing her dizziness-her presenting INR was subtherapeutic -Pharmacy to dose warfarin -CHADVASc = 4    Essential hypertension/mild tricuspid regurgitation -Patient had previous admission for congestive heart failure in setting of atrial fibrillation with RVR and was sent home on low dose Lasix daily -Current blood pressure controlled -May need to reconsider dosing regiment for Lasix given presentation and recent reports of dizziness ongoing prior to bowel prep for colonoscopy -Current weight 174 pounds which is up 4 pounds  since 8/21 that has decreased since peak weight of around 192 pounds in December of last    Acute hypokalemia -Oral potassium 1 dose and repeat labs in a.m.    Chronic back pain -Continue preadmission Ultram prn and scheduled Neurontin    Bipolar disorder  -Continue Lexapro and trazodone    Physical deconditioning -PT evaluation    Chronic constipation -Hold preadmission Lomotil -Begain scheduled Colace and as needed MiraLAX    Dyslipidemia -And tingling in Lipitor      DVT prophylaxis: Warfarin  Code Status: DO NOT RESUSCITATE Family Communication: Daughter at bedside Disposition Plan: Anticipate discharge back to preadmission environment once medically  stable Consults called: None     Brylee Berk L. ANP-BC [facility] Pager [phone]   If 7PM-7AM, please contact night-coverage [email or web address] Password [facility]  12/05/2015, 3:29 PM

## 2015-12-05 NOTE — ED Triage Notes (Signed)
Pt arrives via [facility] from [facility], ems reports pt got dizzy when standing this morning and fell, pt did lose consciousness, unsure if she hit her head. Pt has hx of a fib/takes coumadin, was found to be in a fib at a rate of 70. Pt a/ox4. Ems reports pt positive for orthostatic changes.

## 2015-12-05 NOTE — ED Notes (Signed)
Pt and family (with belongings) transported to 2W26.

## 2015-12-05 NOTE — ED Notes (Signed)
Family at bedside. 

## 2015-12-06 ENCOUNTER — Observation Stay (HOSPITAL_COMMUNITY): Payer: Medicare Other

## 2015-12-06 DIAGNOSIS — N390 Urinary tract infection, site not specified: Secondary | ICD-10-CM | POA: Diagnosis present

## 2015-12-06 DIAGNOSIS — Z885 Allergy status to narcotic agent status: Secondary | ICD-10-CM | POA: Diagnosis not present

## 2015-12-06 DIAGNOSIS — Z880 Allergy status to penicillin: Secondary | ICD-10-CM | POA: Diagnosis not present

## 2015-12-06 DIAGNOSIS — Z7901 Long term (current) use of anticoagulants: Secondary | ICD-10-CM | POA: Diagnosis not present

## 2015-12-06 DIAGNOSIS — I509 Heart failure, unspecified: Secondary | ICD-10-CM | POA: Diagnosis present

## 2015-12-06 DIAGNOSIS — R55 Syncope and collapse: Secondary | ICD-10-CM

## 2015-12-06 DIAGNOSIS — I110 Hypertensive heart disease with heart failure: Secondary | ICD-10-CM | POA: Diagnosis present

## 2015-12-06 DIAGNOSIS — I482 Chronic atrial fibrillation: Secondary | ICD-10-CM | POA: Diagnosis present

## 2015-12-06 DIAGNOSIS — G8929 Other chronic pain: Secondary | ICD-10-CM | POA: Diagnosis present

## 2015-12-06 DIAGNOSIS — I951 Orthostatic hypotension: Secondary | ICD-10-CM | POA: Diagnosis present

## 2015-12-06 DIAGNOSIS — M549 Dorsalgia, unspecified: Secondary | ICD-10-CM | POA: Diagnosis present

## 2015-12-06 DIAGNOSIS — N179 Acute kidney failure, unspecified: Secondary | ICD-10-CM

## 2015-12-06 DIAGNOSIS — Z23 Encounter for immunization: Secondary | ICD-10-CM | POA: Diagnosis not present

## 2015-12-06 DIAGNOSIS — E860 Dehydration: Secondary | ICD-10-CM | POA: Diagnosis present

## 2015-12-06 DIAGNOSIS — Z853 Personal history of malignant neoplasm of breast: Secondary | ICD-10-CM | POA: Diagnosis not present

## 2015-12-06 DIAGNOSIS — J449 Chronic obstructive pulmonary disease, unspecified: Secondary | ICD-10-CM | POA: Diagnosis present

## 2015-12-06 DIAGNOSIS — S51011A Laceration without foreign body of right elbow, initial encounter: Secondary | ICD-10-CM | POA: Diagnosis present

## 2015-12-06 DIAGNOSIS — M1990 Unspecified osteoarthritis, unspecified site: Secondary | ICD-10-CM | POA: Diagnosis present

## 2015-12-06 DIAGNOSIS — R911 Solitary pulmonary nodule: Secondary | ICD-10-CM | POA: Diagnosis present

## 2015-12-06 DIAGNOSIS — W19XXXA Unspecified fall, initial encounter: Secondary | ICD-10-CM | POA: Diagnosis present

## 2015-12-06 DIAGNOSIS — I071 Rheumatic tricuspid insufficiency: Secondary | ICD-10-CM | POA: Diagnosis present

## 2015-12-06 DIAGNOSIS — F319 Bipolar disorder, unspecified: Secondary | ICD-10-CM | POA: Diagnosis present

## 2015-12-06 DIAGNOSIS — Z66 Do not resuscitate: Secondary | ICD-10-CM | POA: Diagnosis present

## 2015-12-06 DIAGNOSIS — K5900 Constipation, unspecified: Secondary | ICD-10-CM | POA: Diagnosis present

## 2015-12-06 DIAGNOSIS — E876 Hypokalemia: Secondary | ICD-10-CM | POA: Diagnosis not present

## 2015-12-06 DIAGNOSIS — E785 Hyperlipidemia, unspecified: Secondary | ICD-10-CM | POA: Diagnosis present

## 2015-12-06 LAB — CBC
HCT: 30.2 % — ABNORMAL LOW (ref 36.0–46.0)
Hemoglobin: 9 g/dL — ABNORMAL LOW (ref 12.0–15.0)
MCH: 24.1 pg — ABNORMAL LOW (ref 26.0–34.0)
MCHC: 29.8 g/dL — ABNORMAL LOW (ref 30.0–36.0)
MCV: 80.7 fL (ref 78.0–100.0)
Platelets: 231 10*3/uL (ref 150–400)
RBC: 3.74 MIL/uL — ABNORMAL LOW (ref 3.87–5.11)
RDW: 17.8 % — ABNORMAL HIGH (ref 11.5–15.5)
WBC: 6.9 10*3/uL (ref 4.0–10.5)

## 2015-12-06 LAB — BASIC METABOLIC PANEL
Anion gap: 10 (ref 5–15)
BUN: 13 mg/dL (ref 6–20)
CO2: 25 mmol/L (ref 22–32)
Calcium: 8.9 mg/dL (ref 8.9–10.3)
Chloride: 110 mmol/L (ref 101–111)
Creatinine, Ser: 1.05 mg/dL — ABNORMAL HIGH (ref 0.44–1.00)
GFR calc Af Amer: 56 mL/min — ABNORMAL LOW (ref >60)
GFR calc non Af Amer: 48 mL/min — ABNORMAL LOW (ref >60)
Glucose, Bld: 94 mg/dL (ref 65–99)
Potassium: 4 mmol/L (ref 3.5–5.1)
Sodium: 145 mmol/L (ref 135–145)

## 2015-12-06 LAB — PROTIME-INR
INR: 1.12
Prothrombin Time: 14.4 seconds (ref 11.4–15.2)

## 2015-12-06 MED ORDER — WARFARIN SODIUM 5 MG PO TABS
5.0000 mg | ORAL_TABLET | Freq: Once | ORAL | Status: AC
  Administered 2015-12-06: 5 mg via ORAL
  Filled 2015-12-06: qty 1

## 2015-12-06 NOTE — Progress Notes (Signed)
PROGRESS NOTE    Tracy Rodriguez  [id number] DOB: 1931/05/26 DOA: 12/05/2015 PCP: [name], MD   Brief Narrative:  Tracy Rodriguez is a 82 y.o. female with medical history significant for chronic atrial fibrillation on warfarin, hypertension, bipolar disorder, chronic back pain, physical deconditioning with PT in place at home. According to the daughter who is assisting with the history, patient has been having some mild dizziness and physical deconditioning for about 3-4 months and is undergoing PT treatments at home. Patient was more dizzy today and when she stood she collapsed with apparent loss of consciousness. Upon awakening patient did not have any focal neurological deficits. Of note the patient recently stopped her Coumadin because she thought it was contributing to her dizziness. She did not notify the ordering physician of this change in her medication. Of note patient underwent colonoscopy recently with bowel prep and since Marcello Moores had more frequent dizzy episodes. She also was complaining of issues related to severe recurrent constipation.  1-Syncope;  Suspect related to orthostatic hypotension, dehydration.  Continue with IV fluids.  Adjust BP medications.  ECHO ordered.  Check cortisol level.   2-AKI; continue with IV fluids.   3-Lung nodule; needs CT. Family considering.   4-A fib; coumadin per pharmacy.  Cardizem low dose.   5-UTI; UA with too numerous to count WBC. Continue with IV ceftriaxone.   6-Essential hypertension/mild tricuspid regurgitation -Patient had previous admission for congestive heart failure in setting of atrial fibrillation with RVR and was sent home on low dose Lasix daily  7-Bipolar disorder  -Continue Lexapro and trazodone   Assessment & Plan:   Principal Problem:   Syncope Active Problems:   Chronic back pain   Bipolar disorder ([facility])   Chronic atrial fibrillation ([facility])   Essential hypertension   Physical deconditioning  Acute kidney injury ([place])   Dehydration, moderate   Acute hypokalemia   Abnormal chest x-ray      DVT prophylaxis: coumadin  Code Status: DNR Family Communication: grandson  Disposition Plan: home in 24 to 48 hours.   Consultants:   none   Procedures:   ECHO.    Antimicrobials:   Ceftriaxone    Subjective: Alert, report lightheaded on standing.   Objective: Vitals:   12/05/15 1626 12/05/15 2124 12/06/15 0113 12/06/15 0607  BP: (!) 160/80 (!) 152/57    Pulse: 92 68    Resp: 20 18  17   Temp:  97.6 F (36.4 C)  98.2 F (36.8 C)  TempSrc: Oral Oral  Oral  SpO2: 99% 95%  97%  Weight:   75.8 kg (167 lb 3.2 oz)   Height:       No intake or output data in the 24 hours ending 12/06/15 1042 Filed Weights   12/05/15 0903 12/06/15 0113  Weight: 78.9 kg (174 lb) 75.8 kg (167 lb 3.2 oz)    Examination:  General exam: Appears calm and comfortable  Respiratory system: Clear to auscultation. Respiratory effort normal. Cardiovascular system: S1 & S2 heard, RRR. No JVD, murmurs, rubs, gallops or clicks. No pedal edema. Gastrointestinal system: Abdomen is nondistended, soft and nontender. No organomegaly or masses felt. Normal bowel sounds heard. Central nervous system: Alert and oriented. No focal neurological deficits. Extremities: Symmetric 5 x 5 power. Skin: No rashes, lesions or ulcers Psychiatry: Judgement and insight appear normal. Mood & affect appropriate.     Data Reviewed: I have personally reviewed following labs and imaging studies  CBC:  Recent Labs Lab 12/05/15 1035 12/06/15 0530  WBC 6.7 6.9  HGB 11.0* 9.0*  HCT 37.5 30.2*  MCV 81.5 80.7  PLT 257 [id number]   Basic Metabolic Panel:  Recent Labs Lab 12/05/15 1035 12/05/15 1838 12/06/15 0530  NA 141  --  145  K 3.3*  --  4.0  CL 105  --  110  CO2 27  --  25  GLUCOSE 94  --  94  BUN 18  --  13  CREATININE 1.35*  --  1.05*  CALCIUM 9.6  --  8.9  MG  --  2.1  --   PHOS  --  2.4*  --     GFR: Estimated Creatinine Clearance: 43 mL/min (by C-G formula based on SCr of 1.05 mg/dL (H)). Liver Function Tests: No results for input(s): AST, ALT, ALKPHOS, BILITOT, PROT, ALBUMIN in the last 168 hours. No results for input(s): LIPASE, AMYLASE in the last 168 hours. No results for input(s): AMMONIA in the last 168 hours. Coagulation Profile:  Recent Labs Lab 12/05/15 1035 12/06/15 0530  INR 1.03 1.12   Cardiac Enzymes: No results for input(s): CKTOTAL, CKMB, CKMBINDEX, TROPONINI in the last 168 hours. BNP (last 3 results)  Recent Labs  01/16/15 1411  PROBNP 276.0*   HbA1C: No results for input(s): HGBA1C in the last 72 hours. CBG:  Recent Labs Lab 12/05/15 0858  GLUCAP 87   Lipid Profile: No results for input(s): CHOL, HDL, LDLCALC, TRIG, CHOLHDL, LDLDIRECT in the last 72 hours. Thyroid Function Tests:  Recent Labs  12/05/15 1838  TSH 1.615   Anemia Panel: No results for input(s): VITAMINB12, FOLATE, FERRITIN, TIBC, IRON, RETICCTPCT in the last 72 hours. Sepsis Labs: No results for input(s): PROCALCITON, LATICACIDVEN in the last 168 hours.  No results found for this or any previous visit (from the past 240 hour(s)).       Radiology Studies: Ct Head Wo Contrast  Result Date: 12/05/2015 CLINICAL DATA:  Episode of dizziness with fall. Transient loss of consciousness. EXAM: CT HEAD WITHOUT CONTRAST TECHNIQUE: Contiguous axial images were obtained from the base of the skull through the vertex without intravenous contrast. COMPARISON:  None. FINDINGS: Brain: There is age related volume loss. There is no intracranial mass, hemorrhage, extra-axial fluid collection, or midline shift. There is minimal small vessel disease in the centra semiovale bilaterally. Elsewhere gray-white compartments appear normal. No acute infarct evident. Vascular: There is no demonstrable hyperdense vessel. There is mild calcification in the distal left vertebral artery. There is also  calcification in the right carotid siphon. Skull: The bony calvarium appears intact. Sinuses/Orbits: There is mild mucosal thickening in several ethmoid air cells bilaterally. Visualized paranasal sinuses elsewhere appear unremarkable. Orbits appear symmetric and unremarkable bilaterally. Other: Mastoid air cells are clear. IMPRESSION: Age related volume loss with rather minimal periventricular small vessel disease. No intracranial mass, hemorrhage, or focal acute appearing infarct. Areas of arterial vascular calcification. Mild ethmoid sinus disease bilaterally. Electronically Signed   By: Lowella Grip III M.D.   On: 12/05/2015 10:06   Dg Chest Port 1 View  Result Date: 12/05/2015 CLINICAL DATA:  Syncope today.  History of hypertension. EXAM: PORTABLE CHEST 1 VIEW COMPARISON:  Chest x-rays dated 10/22/2015, 08/13/2015 01/16/2015. FINDINGS: Today's study is slightly hypoinspiratory compared to the previous exams, however, previous exams showed lungs to be hyperexpanded suggesting COPD. There is a questionable new density within the upper right lung. Heart size remains normal. Osseous structures about the chest are unremarkable. IMPRESSION: 1. Questionable new density within the upper right lung.  Developing neoplastic nodule cannot be excluded. Recommend chest CT for further characterization. 2. COPD/emphysema. Electronically Signed   By: Franki Cabot M.D.   On: 12/05/2015 09:41        Scheduled Meds: . atorvastatin  20 mg Oral q1800  . cefTRIAXone (ROCEPHIN)  IV  1 g Intravenous Q24H  . diltiazem  120 mg Oral Daily  . docusate sodium  100 mg Oral BID  . escitalopram  10 mg Oral Daily  . gabapentin  300 mg Oral BID  . Influenza vac split quadrivalent PF  0.5 mL Intramuscular Tomorrow-1000  . mirabegron ER  25 mg Oral Daily  . QUEtiapine  25 mg Oral BID  . sodium chloride flush  3 mL Intravenous Q12H  . traZODone  150 mg Oral QHS  . Warfarin - Pharmacist Dosing Inpatient   Does not apply  q1800   Continuous Infusions: . sodium chloride 75 mL/hr at 12/06/15 0050     LOS: 0 days    Time spent: 35 minutes.     Elmarie Shiley, MD [facility] Pager [phone]  If 7PM-7AM, please contact night-coverage [email or web address] Password [facility] 12/06/2015, 10:42 AM

## 2015-12-06 NOTE — Evaluation (Signed)
Physical Therapy Evaluation Patient Details Name: Tracy Rodriguez MRN: [id number] DOB: 25-Oct-1931 Today's Date: 12/06/2015   History of Present Illness  Tracy Rodriguez is a 82 y.o. female with a Past Medical History of bipolar, breast cancer, chronic back pain, HLD, HTN who presents with witnessed syncopal episode. Likely from combination of UTI and orthostasis from intravascular volume depletion. Afib possibly also contributing but less likely.   Clinical Impression  Pt admitted with above diagnosis. Pt currently with functional limitations due to the deficits listed below (see PT Problem List). Pt orthostatic on eval which limited mobility. Transferred to and from [facility] with min A, not firther ambulation tolerated.  Pt will benefit from skilled PT to increase their independence and safety with mobility to allow discharge to the venue listed below.       Follow Up Recommendations Home health PT;Supervision/Assistance - 24 hour    Equipment Recommendations  None recommended by PT    Recommendations for Other Services       Precautions / Restrictions Precautions Precautions: Fall Restrictions Weight Bearing Restrictions: No      Mobility  Bed Mobility Overal bed mobility: Modified Independent             General bed mobility comments: pt able to get in and out of bed without assistance including managing covers  Transfers Overall transfer level: Needs assistance Equipment used: Rolling walker (2 wheeled);None Transfers: Sit to/from [employer] to Stand: Min assist Stand pivot transfers: Min assist       General transfer comment: pt stood to RW for BP check, could not maintain standing more than 2 mons due to feeling weak once standing. Wanted to walk into bathroom but did not allow her to do so because of BP. Pivoted to [facility] and back to bed without RW and min A.   Ambulation/Gait             General Gait Details: did not ambulate due to  BP  Stairs            Wheelchair Mobility    Modified Rankin (Stroke Patients Only)       Balance Overall balance assessment: Needs assistance Sitting-balance support: No upper extremity supported Sitting balance-Leahy Scale: Good Sitting balance - Comments: wt shifts appropriately in sitting, no LOB   Standing balance support: Single extremity supported Standing balance-Leahy Scale: Poor Standing balance comment: very weak in standing, required support                             Pertinent Vitals/Pain Pain Assessment: No/denies pain    Home Living Family/patient expects to be discharged to:: Private residence Living Arrangements: Alone Available Help at Discharge: Family;Available 24 hours/day Type of Home: Independent living facility Home Access: Level entry     Home Layout: One level Home Equipment: Walker - 2 wheels Additional Comments: pt pulling covers over head and not wanting to get up but when grandson arrived, she perked up considerably    Prior Function Level of Independence: Independent with assistive device(s)         Comments: grandson reports that pt has been very tired past month and family purchased her a RW a month ago that she has been using due to her fatigue     Hand Dominance        Extremity/Trunk Assessment   Upper Extremity Assessment: Generalized weakness           Lower  Extremity Assessment: Generalized weakness      Cervical / Trunk Assessment: Kyphotic  Communication   Communication: No difficulties  Cognition Arousal/Alertness: Lethargic Behavior During Therapy: Flat affect Overall Cognitive Status: History of cognitive impairments - at baseline       Memory: Decreased short-term memory              General Comments General comments (skin integrity, edema, etc.): BP supine 151/58  sitting 142/70  standing 85/65    Exercises General Exercises - Lower Extremity Ankle Circles/Pumps:  AROM;Both;20 reps;Supine;Seated   Assessment/Plan    PT Assessment Patient needs continued PT services  PT Problem List Decreased strength;Decreased activity tolerance;Decreased balance;Decreased mobility;Decreased cognition;Decreased knowledge of precautions;Cardiopulmonary status limiting activity          PT Treatment Interventions DME instruction;Gait training;Functional mobility training;Therapeutic activities;Therapeutic exercise;Balance training;Patient/family education    PT Goals (Current goals can be found in the Care Plan section)  Acute Rehab PT Goals Patient Stated Goal: feel betetr PT Goal Formulation: With patient Time For Goal Achievement: 01/19/16 Potential to Achieve Goals: Good    Frequency Min 3X/week   Barriers to discharge   grandson not currently working and able to stay with pt as needed    Co-evaluation               End of Session Equipment Utilized During Treatment: Gait belt Activity Tolerance: Other (comment);Patient limited by fatigue;Patient limited by lethargy (limited by [facility]) Patient left: in bed;with bed alarm set;with family/visitor present;with call bell/phone within reach Nurse Communication: Mobility status    Functional Assessment Tool Used: clinical judgement Functional Limitation: Mobility: Walking and moving around Mobility: Walking and Moving Around Current Status [id number]): At least 20 percent but less than 40 percent impaired, limited or restricted Mobility: Walking and Moving Around Goal Status [phone]): At least 1 percent but less than 20 percent impaired, limited or restricted    Time: 0951-1024 PT Time Calculation (min) (ACUTE ONLY): 33 min   Charges:   PT Evaluation $PT Eval Moderate Complexity: 1 Procedure PT Treatments $Therapeutic Activity: 8-22 mins   PT G Codes:   PT G-Codes **NOT FOR INPATIENT CLASS** Functional Assessment Tool Used: clinical judgement Functional Limitation: Mobility: Walking and moving  around Mobility: Walking and Moving Around Current Status [id number]): At least 20 percent but less than 40 percent impaired, limited or restricted Mobility: Walking and Moving Around Goal Status [phone]): At least 1 percent but less than 20 percent impaired, limited or restricted   Leighton Roach, PT  Acute Rehab Services  [phone]  Leighton Roach 12/06/2015, 11:06 AM

## 2015-12-06 NOTE — Progress Notes (Signed)
ANTICOAGULATION CONSULT NOTE - follow up consult  Pharmacy Consult for Warfarin Indication: atrial fibrillation  Allergies  Allergen Reactions  . Codeine Anaphylaxis  . Sulfa Antibiotics Rash  . Darvon [Propoxyphene] Other (See Comments)  . Floxin [Ofloxacin] Other (See Comments)  . Levsin [Hyoscyamine Sulfate] Other (See Comments)  . Penicillins     Has patient had a PCN reaction causing immediate rash, facial/tongue/throat swelling, SOB or lightheadedness with hypotension: Yes Has patient had a PCN reaction causing severe rash involving mucus membranes or skin necrosis: No Has patient had a PCN reaction that required hospitalization No Has patient had a PCN reaction occurring within the last 10 years: No If all of the above answers are "NO", then may proceed with Cephalosporin use.   . Valium [Diazepam] Other (See Comments)  . Zantac [Ranitidine Hcl] Other (See Comments)    Patient Measurements: Height: 5\' 6"  (167.6 cm) Weight: 167 lb 3.2 oz (75.8 kg) IBW/kg (Calculated) : 59.3   Vital Signs: Temp: 97.9 F (36.6 C) (09/27 1300) Temp Source: Oral (09/27 1300) BP: 155/58 (09/27 1300) Pulse Rate: 70 (09/27 1300)  Labs:  Recent Labs  12/05/15 1035 12/06/15 0530  HGB 11.0* 9.0*  HCT 37.5 30.2*  PLT 257 231  LABPROT 13.5 14.4  INR 1.03 1.12  CREATININE 1.35* 1.05*    Estimated Creatinine Clearance: 43 mL/min (by C-G formula based on SCr of 1.05 mg/dL (H)).   Medical History: Past Medical History:  Diagnosis Date  . Arthritis    "joints" (08/04/2014)  . Bipolar 1 disorder ([place])   . Cancer of right breast ([place])   . Chronic lower back pain   . Hyperlipemia   . Hypertension   . PAF (paroxysmal atrial fibrillation) (HCC)     Medications:  Facility-Administered Medications Prior to Admission  Medication Dose Route Frequency Provider Last Rate Last Dose  . 0.9 %  sodium chloride infusion  500 mL Intravenous Continuous Mauri Pole, MD        Prescriptions Prior to Admission  Medication Sig Dispense Refill Last Dose  . atorvastatin (LIPITOR) 20 MG tablet Take 1 tablet (20 mg total) by mouth daily. 90 tablet 3 12/04/2015 at am  . diltiazem (DILACOR XR) 120 MG 24 hr capsule Take 1 capsule (120 mg total) by mouth daily. 90 capsule 3 12/04/2015 at am  . diphenoxylate-atropine (LOMOTIL) 2.5-0.025 MG tablet Take 1 tablet by mouth 4 (four) times daily as needed for diarrhea or loose stools. 30 tablet 0 Past Month at Unknown time  . escitalopram (LEXAPRO) 10 MG tablet TAKE 1 TABLET(10 MG) BY MOUTH DAILY (Patient taking differently: Take 10 mg by mouth once a day) 90 tablet 0 12/04/2015 at am  . furosemide (LASIX) 40 MG tablet TAKE 1 TABLET BY MOUTH EVERY DAY (Patient taking differently: Take 40 mg by mouth in the morning) 90 tablet 1 12/04/2015 at am  . gabapentin (NEURONTIN) 300 MG capsule Take 1 capsule (300 mg total) by mouth 2 (two) times daily. 180 capsule 3 12/04/2015 at pm  . mirabegron ER (MYRBETRIQ) 25 MG TB24 tablet Take 1 tablet (25 mg total) by mouth daily. 30 tablet 3 12/04/2015 at am  . QUEtiapine (SEROQUEL) 25 MG tablet Take 1 tablet (25 mg total) by mouth 2 (two) times daily. 180 tablet 1 12/04/2015 at pm  . traMADol (ULTRAM) 50 MG tablet Take 1 tablet (50 mg total) by mouth every 6 (six) hours as needed. (Patient taking differently: Take 100 mg by mouth 2 (two) times daily. )  90 tablet 2 12/04/2015 at pm  . traZODone (DESYREL) 150 MG tablet TAKE 1 TABLET BY MOUTH EVERY NIGHT AT BEDTIME (Patient taking differently: Take 150 mg by mouth at bedtime) 90 tablet 2 12/04/2015 at pm  . warfarin (COUMADIN) 5 MG tablet Take as directed by anticoagulation clinic. (Patient taking differently: Take 2.5-5 mg by mouth See admin instructions. 2.5 mg in the morning on Sun/Tues/Wed/Thurs/Fri/Sat and 5 mg on Mon) 30 tablet 3 11/30/2015 at am   Scheduled:  . atorvastatin  20 mg Oral q1800  . cefTRIAXone (ROCEPHIN)  IV  1 g Intravenous Q24H  . diltiazem   120 mg Oral Daily  . docusate sodium  100 mg Oral BID  . escitalopram  10 mg Oral Daily  . gabapentin  300 mg Oral BID  . mirabegron ER  25 mg Oral Daily  . QUEtiapine  25 mg Oral BID  . sodium chloride flush  3 mL Intravenous Q12H  . traZODone  150 mg Oral QHS  . Warfarin - Pharmacist Dosing Inpatient   Does not apply q1800   Infusions:  . sodium chloride 100 mL/hr at 12/06/15 1116    Assessment: 82yo female with history of Afib on warfarin PTA presents with Pharmacy is consulted to dose warfarin for atrial fibrillation. Pt recently held warfarin for GI procedure on 9/20. Restarted warfarin on 9/26.  PTA Warfarin Dose: 5mg  Mon and 2.5mg  AODs  Goal of Therapy:  INR 2-3 Monitor platelets by anticoagulation protocol: Yes   Plan:  Warfarin 5mg  PO tonight x1 Monitor daily INR, CBC, clinical course, s/sx of bleed, PO intake, DDI   Thank you for allowing [place] to participate in this patients care. Jens Som, PharmD Pager: [phone] 12/06/2015,2:31 PM

## 2015-12-06 NOTE — Progress Notes (Signed)
[facility]  Patient Status: Active (receiving services up to time of hospitalization)  [facility] is providing the following services: PT and OT  If patient discharges after hours, please call [phone].   Tracy Rodriguez 12/06/2015, 4:45 PM

## 2015-12-07 ENCOUNTER — Ambulatory Visit (HOSPITAL_COMMUNITY): Payer: Medicare Other

## 2015-12-07 DIAGNOSIS — R55 Syncope and collapse: Secondary | ICD-10-CM

## 2015-12-07 DIAGNOSIS — E876 Hypokalemia: Secondary | ICD-10-CM

## 2015-12-07 LAB — BASIC METABOLIC PANEL
Anion gap: 4 — ABNORMAL LOW (ref 5–15)
BUN: 11 mg/dL (ref 6–20)
CO2: 26 mmol/L (ref 22–32)
Calcium: 8.5 mg/dL — ABNORMAL LOW (ref 8.9–10.3)
Chloride: 112 mmol/L — ABNORMAL HIGH (ref 101–111)
Creatinine, Ser: 0.99 mg/dL (ref 0.44–1.00)
GFR calc Af Amer: 60 mL/min — ABNORMAL LOW (ref >60)
GFR calc non Af Amer: 52 mL/min — ABNORMAL LOW (ref >60)
Glucose, Bld: 105 mg/dL — ABNORMAL HIGH (ref 65–99)
Potassium: 3.5 mmol/L (ref 3.5–5.1)
Sodium: 142 mmol/L (ref 135–145)

## 2015-12-07 LAB — URINE CULTURE

## 2015-12-07 LAB — PROTIME-INR
INR: 1.23
Prothrombin Time: 15.6 seconds — ABNORMAL HIGH (ref 11.4–15.2)

## 2015-12-07 LAB — GLUCOSE, CAPILLARY: Glucose-Capillary: 102 mg/dL — ABNORMAL HIGH (ref 65–99)

## 2015-12-07 LAB — ECHOCARDIOGRAM COMPLETE
Height: 66 in
Weight: 2698.43 oz

## 2015-12-07 LAB — PHOSPHORUS: Phosphorus: 2.6 mg/dL (ref 2.5–4.6)

## 2015-12-07 LAB — CORTISOL-AM, BLOOD: Cortisol - AM: 13.5 ug/dL (ref 6.7–22.6)

## 2015-12-07 MED ORDER — WARFARIN SODIUM 5 MG PO TABS
5.0000 mg | ORAL_TABLET | Freq: Once | ORAL | Status: AC
  Administered 2015-12-07: 5 mg via ORAL
  Filled 2015-12-07: qty 1

## 2015-12-07 MED ORDER — CEPHALEXIN 500 MG PO CAPS: 500.0000 mg | ORAL_CAPSULE | Freq: Two times a day (BID) | ORAL | 0 refills | Status: DC

## 2015-12-07 NOTE — Discharge Summary (Signed)
Physician Discharge Summary  Tracy Rodriguez [id number] DOB: [birth year] DOA: 12/05/2015  PCP: [name], MD  Admit date: 12/05/2015 Discharge date: 12/07/2015  Admitted From: Home  Disposition:  Home   Recommendations for Outpatient Follow-up:  1. Follow up with PCP in 1-2 weeks 2. Please obtain BMP/CBC in one week 3. Needs further evaluation of pulmonary nodule.  4. Further evaluation of orthostatic hypotension.   Home Health: Yes    Discharge Condition: stable.  CODE STATUS: DNR Diet recommendation: Heart Healthy   Brief/Interim Summary:  Brief Narrative: Tracy Pointeris a 82 y.o.femalewith medical history significant for chronic atrial fibrillation on warfarin, hypertension, bipolar disorder, chronic back pain, physical deconditioning with PT in place at home. According to the daughter who is assisting with the history, patient has been having some mild dizziness and physical deconditioning for about 3-4 months and is undergoing PT treatments at home. Patient was more dizzy today and when she stood shecollapsed with apparent loss of consciousness. Upon awakening patient did not have any focal neurological deficits. Of note the patient recently stopped her Coumadin because she thought it was contributing to her dizziness. She did not notify theordering physician of this change in her medication. Of note patient underwent colonoscopy recently with bowel prep and since Tracy Rodriguez had more frequent dizzy episodes. She also was complaining of issues related to severe recurrent constipation.  1-Syncope;  Suspect related to orthostatic hypotension, dehydration.  Improved with V fluids.  Adjust BP medications. Discontinue lasix.  ECHO ordered.   cortisol level normal.  Discharge today after she works with PT and if ECHO stable.  orthostatics vital improved, still with orthostatics but asymptomatic at this time.   2-AKI; continue with IV fluids. Resolved.   3-Lung  nodule; needs CT. Family considering. Needs further discussion   4-A fib; coumadin per pharmacy.  Cardizem low dose.  need INR in 48 hours.   5-UTI; UA with too numerous to count WBC. Received IV ceftriaxone for 2 days. Discharge on keflex for 2 more days   6-Essential hypertension/mildtricuspid regurgitation -Patient had previous admission for congestive heart failure in setting of atrial fibrillation with RVR and was sent home on low dose Lasix daily hold lasix.   7-Bipolar disorder  -Continue Lexapro and trazodone  Discharge Diagnoses:  Principal Problem:   Syncope Active Problems:   Chronic back pain   Bipolar disorder (HCC)   Chronic atrial fibrillation (HCC)   Essential hypertension   Physical deconditioning   Acute kidney injury ([place])   Dehydration, moderate   Acute hypokalemia   Abnormal chest x-ray    Discharge Instructions     Medication List    STOP taking these medications   diphenoxylate-atropine 2.5-0.025 MG tablet Commonly known as:  LOMOTIL   furosemide 40 MG tablet Commonly known as:  LASIX     TAKE these medications   atorvastatin 20 MG tablet Commonly known as:  LIPITOR Take 1 tablet (20 mg total) by mouth daily.   cephALEXin 500 MG capsule Commonly known as:  KEFLEX Take 1 capsule (500 mg total) by mouth 2 (two) times daily.   diltiazem 120 MG 24 hr capsule Commonly known as:  DILACOR XR Take 1 capsule (120 mg total) by mouth daily.   escitalopram 10 MG tablet Commonly known as:  LEXAPRO TAKE 1 TABLET(10 MG) BY MOUTH DAILY What changed:  See the new instructions.   gabapentin 300 MG capsule Commonly known as:  NEURONTIN Take 1 capsule (300 mg total) by mouth 2 (two) times  daily.   mirabegron ER 25 MG Tb24 tablet Commonly known as:  MYRBETRIQ Take 1 tablet (25 mg total) by mouth daily.   QUEtiapine 25 MG tablet Commonly known as:  SEROQUEL Take 1 tablet (25 mg total) by mouth 2 (two) times daily.   traMADol 50 MG  tablet Commonly known as:  ULTRAM Take 1 tablet (50 mg total) by mouth every 6 (six) hours as needed. What changed:  how much to take  when to take this   traZODone 150 MG tablet Commonly known as:  DESYREL TAKE 1 TABLET BY MOUTH EVERY NIGHT AT BEDTIME What changed:  See the new instructions.   warfarin 5 MG tablet Commonly known as:  COUMADIN Take as directed by anticoagulation clinic. What changed:  how much to take  how to take this  when to take this  additional instructions       Allergies  Allergen Reactions  . Codeine Anaphylaxis  . Sulfa Antibiotics Rash  . Darvon [Propoxyphene] Other (See Comments)  . Floxin [Ofloxacin] Other (See Comments)  . Levsin [Hyoscyamine Sulfate] Other (See Comments)  . Penicillins     Has patient had a PCN reaction causing immediate rash, facial/tongue/throat swelling, SOB or lightheadedness with hypotension: Yes Has patient had a PCN reaction causing severe rash involving mucus membranes or skin necrosis: No Has patient had a PCN reaction that required hospitalization No Has patient had a PCN reaction occurring within the last 10 years: No If all of the above answers are "NO", then may proceed with Cephalosporin use.   . Valium [Diazepam] Other (See Comments)  . Zantac [Ranitidine Hcl] Other (See Comments)    Consultations:  none   Procedures/Studies: Dg Chest 2 View  Result Date: 12/06/2015 CLINICAL DATA:  History of right breast malignancy, atrial fibrillation. Recent abnormal chest x-ray EXAM: CHEST  2 VIEW COMPARISON:  Portable chest x-ray of December 05, 2015 as well as chest x-ray of January 16, 2015 and Aug 04, 2014. FINDINGS: The lungs are well-expanded. There is persistent 2.5 cm diameter nodular density in the right mid lung. It lies above the minor fissure on the frontal view and posterior to the right mainstem bronchus on the lateral view. There is no pleural effusion or pneumothorax. The heart and pulmonary  vascularity are normal. The mediastinum is normal in width. The bony thorax exhibits no acute abnormality. IMPRESSION: New nodule inferiorly in the right upper lobe. No air bronchograms are observed within it. Underlying COPD. Chest CT scanning is recommended to exclude primary or metastatic malignancy. Electronically Signed   By: [name]  Martinique M.D.   On: 12/06/2015 11:09   Ct Head Wo Contrast  Result Date: 12/05/2015 CLINICAL DATA:  Episode of dizziness with fall. Transient loss of consciousness. EXAM: CT HEAD WITHOUT CONTRAST TECHNIQUE: Contiguous axial images were obtained from the base of the skull through the vertex without intravenous contrast. COMPARISON:  None. FINDINGS: Brain: There is age related volume loss. There is no intracranial mass, hemorrhage, extra-axial fluid collection, or midline shift. There is minimal small vessel disease in the centra semiovale bilaterally. Elsewhere gray-white compartments appear normal. No acute infarct evident. Vascular: There is no demonstrable hyperdense vessel. There is mild calcification in the distal left vertebral artery. There is also calcification in the right carotid siphon. Skull: The bony calvarium appears intact. Sinuses/Orbits: There is mild mucosal thickening in several ethmoid air cells bilaterally. Visualized paranasal sinuses elsewhere appear unremarkable. Orbits appear symmetric and unremarkable bilaterally. Other: Mastoid air cells are clear.  IMPRESSION: Age related volume loss with rather minimal periventricular small vessel disease. No intracranial mass, hemorrhage, or focal acute appearing infarct. Areas of arterial vascular calcification. Mild ethmoid sinus disease bilaterally. Electronically Signed   By: Lowella Grip III M.D.   On: 12/05/2015 10:06   Dg Chest Port 1 View  Result Date: 12/05/2015 CLINICAL DATA:  Syncope today.  History of hypertension. EXAM: PORTABLE CHEST 1 VIEW COMPARISON:  Chest x-rays dated 10/22/2015, 08/13/2015  01/16/2015. FINDINGS: Today's study is slightly hypoinspiratory compared to the previous exams, however, previous exams showed lungs to be hyperexpanded suggesting COPD. There is a questionable new density within the upper right lung. Heart size remains normal. Osseous structures about the chest are unremarkable. IMPRESSION: 1. Questionable new density within the upper right lung. Developing neoplastic nodule cannot be excluded. Recommend chest CT for further characterization. 2. COPD/emphysema. Electronically Signed   By: Franki Cabot M.D.   On: 12/05/2015 09:41    ECHO    Subjective: Feeling better , denies lightheaded on standing   Discharge Exam: Vitals:   12/06/15 2045 12/07/15 0616  BP: (!) 174/70   Pulse: 70 72  Resp: 18 18  Temp: 97.4 F (36.3 C) 97.5 F (36.4 C)   Vitals:   12/06/15 1300 12/06/15 2045 12/07/15 0616 12/07/15 0619  BP: (!) 155/58 (!) 174/70    Pulse: 70 70 72   Resp: 12 18 18    Temp: 97.9 F (36.6 C) 97.4 F (36.3 C) 97.5 F (36.4 C)   TempSrc: Oral Oral Oral   SpO2:  96%  98%  Weight:    76.5 kg (168 lb 10.4 oz)  Height:        General: Pt is alert, awake, not in acute distress Cardiovascular: RRR, S1/S2 +, no rubs, no gallops Respiratory: CTA bilaterally, no wheezing, no rhonchi Abdominal: Soft, NT, ND, bowel sounds + Extremities: no edema, no cyanosis    The results of significant diagnostics from this hospitalization (including imaging, microbiology, ancillary and laboratory) are listed below for reference.     Microbiology: Recent Results (from the past 240 hour(s))  Culture, Urine     Status: Abnormal   Collection Time: 12/05/15  1:38 PM  Result Value Ref Range Status   Specimen Description URINE, RANDOM  Final   Special Requests NONE  Final   Culture MULTIPLE SPECIES PRESENT, SUGGEST RECOLLECTION (A)  Final   Report Status 12/07/2015 FINAL  Final     Labs: BNP (last 3 results)  Recent Labs  10/22/15 1847  BNP 118.7*    Basic Metabolic Panel:  Recent Labs Lab 12/05/15 1035 12/05/15 1838 12/06/15 0530 12/07/15 0321  NA 141  --  145 142  K 3.3*  --  4.0 3.5  CL 105  --  110 112*  CO2 27  --  25 26  GLUCOSE 94  --  94 105*  BUN 18  --  13 11  CREATININE 1.35*  --  1.05* 0.99  CALCIUM 9.6  --  8.9 8.5*  MG  --  2.1  --   --   PHOS  --  2.4*  --  2.6   Liver Function Tests: No results for input(s): AST, ALT, ALKPHOS, BILITOT, PROT, ALBUMIN in the last 168 hours. No results for input(s): LIPASE, AMYLASE in the last 168 hours. No results for input(s): AMMONIA in the last 168 hours. CBC:  Recent Labs Lab 12/05/15 1035 12/06/15 0530  WBC 6.7 6.9  HGB 11.0* 9.0*  HCT 37.5  30.2*  MCV 81.5 80.7  PLT 257 231   Cardiac Enzymes: No results for input(s): CKTOTAL, CKMB, CKMBINDEX, TROPONINI in the last 168 hours. BNP: Invalid input(s): POCBNP CBG:  Recent Labs Lab 12/05/15 0858 12/07/15 0700  GLUCAP 87 102*   D-Dimer No results for input(s): DDIMER in the last 72 hours. Hgb A1c No results for input(s): HGBA1C in the last 72 hours. Lipid Profile No results for input(s): CHOL, HDL, LDLCALC, TRIG, CHOLHDL, LDLDIRECT in the last 72 hours. Thyroid function studies  Recent Labs  12/05/15 1838  TSH 1.615   Anemia work up No results for input(s): VITAMINB12, FOLATE, FERRITIN, TIBC, IRON, RETICCTPCT in the last 72 hours. Urinalysis    Component Value Date/Time   COLORURINE YELLOW 12/05/2015 1338   APPEARANCEUR TURBID (A) 12/05/2015 1338   LABSPEC 1.018 12/05/2015 1338   PHURINE 5.5 12/05/2015 1338   GLUCOSEU NEGATIVE 12/05/2015 1338   HGBUR TRACE (A) 12/05/2015 1338   BILIRUBINUR NEGATIVE 12/05/2015 1338   BILIRUBINUR n 07/03/2015 1457   KETONESUR NEGATIVE 12/05/2015 1338   PROTEINUR NEGATIVE 12/05/2015 1338   UROBILINOGEN 1.0 07/03/2015 1457   UROBILINOGEN 0.2 08/04/2014 0657   NITRITE NEGATIVE 12/05/2015 1338   LEUKOCYTESUR LARGE (A) 12/05/2015 1338   Sepsis Labs Invalid  input(s): PROCALCITONIN,  WBC,  LACTICIDVEN Microbiology Recent Results (from the past 240 hour(s))  Culture, Urine     Status: Abnormal   Collection Time: 12/05/15  1:38 PM  Result Value Ref Range Status   Specimen Description URINE, RANDOM  Final   Special Requests NONE  Final   Culture MULTIPLE SPECIES PRESENT, SUGGEST RECOLLECTION (A)  Final   Report Status 12/07/2015 FINAL  Final     Time coordinating discharge: Over 30 minutes  SIGNED:   Elmarie Shiley, MD  [facility] 12/07/2015, 2:51 PM Pager [phone]  If 7PM-7AM, please contact night-coverage [email or web address] Password [email or web address]

## 2015-12-07 NOTE — Progress Notes (Signed)
Tracy Rodriguez to be D/C'd Home per MD order. Discussed with the patient and all questions fully answered.    VVS, Skin clean, dry and intact without evidence of skin break down, no evidence of skin tears noted.  IV catheter discontinued intact. Site without signs and symptoms of complications. Dressing and pressure applied.  An After Visit Summary was printed and given to the patient.  Patient escorted via [name], and D/C home via private auto.  [name]

## 2015-12-07 NOTE — Progress Notes (Signed)
Physical Therapy Treatment Patient Details Name: Genesi Stefanko MRN: [id number] DOB: 05/04/1931 Today's Date: 18-Dec-2015    History of Present Illness Julieanna Geraci is a 82 y.o. female with a Past Medical History of bipolar, breast cancer, chronic back pain, HLD, HTN who presents with witnessed syncopal episode. Likely from combination of UTI and orthostasis from intravascular volume depletion. Afib possibly also contributing but less likely.     PT Comments    Pt doing well with mobility and no further PT needed.  Ready for dc from PT standpoint.    Follow Up Recommendations  Home health PT;Supervision/Assistance - 24 hour     Equipment Recommendations  None recommended by PT    Recommendations for Other Services       Precautions / Restrictions Precautions Precautions: Fall Restrictions Weight Bearing Restrictions: No    Mobility  Bed Mobility               General bed mobility comments: Pt up in bathroom  Transfers Overall transfer level: Modified independent Equipment used: 4-wheeled walker Transfers: Sit to/from Stand Sit to Stand: Modified independent (Device/Increase time)            Ambulation/Gait Ambulation/Gait assistance: Modified independent (Device/Increase time) Ambulation Distance (Feet): 250 Feet Assistive device: 4-wheeled walker Gait Pattern/deviations: Step-through pattern;Decreased stride length     General Gait Details: Stedy gait with rollator. No dizziness or light headedness   Stairs            Wheelchair Mobility    Modified Rankin (Stroke Patients Only)       Balance Overall balance assessment: Needs assistance Sitting-balance support: No upper extremity supported;Feet supported Sitting balance-Leahy Scale: Good     Standing balance support: No upper extremity supported;During functional activity Standing balance-Leahy Scale: Fair                      Cognition Arousal/Alertness:  Awake/alert Behavior During Therapy: WFL for tasks assessed/performed Overall Cognitive Status: History of cognitive impairments - at baseline       Memory: Decreased short-term memory              Exercises      General Comments        Pertinent Vitals/Pain      Home Living                      Prior Function            PT Goals (current goals can now be found in the care plan section) Progress towards PT goals: Goals met/education completed, patient discharged from PT    Frequency           PT Plan Current plan remains appropriate    Co-evaluation             End of Session   Activity Tolerance: Patient tolerated treatment well Patient left: in bed;with family/visitor present;with call bell/phone within reach     Time:  -     Charges:  $Gait Training: 8-22 mins                    G Codes:      Tyee Vandevoorde 18-Dec-2015, 3:24 PM [facility]

## 2015-12-07 NOTE — Progress Notes (Signed)
  Echocardiogram 2D Echocardiogram has been performed.  Tracy Rodriguez 12/07/2015, 2:04 PM

## 2015-12-07 NOTE — Clinical Social Work Note (Signed)
Patient is from [place] at [facility] independent living and can return. CSW signing off.   [employer], LCSW [phone]

## 2015-12-07 NOTE — Care Management Note (Signed)
Case Management Note Marvetta Gibbons RN, BSN Unit 2W-Case Manager [phone]  Patient Details  Name: Tracy Rodriguez MRN: [id number] Date of Birth: [birth year]  Subjective/Objective: Pt admitted with syncope                  Action/Plan: PTA pt lived at [place]- was active with [facility] for HHRN/PT- plan to return to [place] at [facility] with continued [facility] services- orders have been placed to resume [facility] RN/PT- have notified Santiago Glad with [facility] of resumption of [facility] services.   Expected Discharge Date:      12/07/15            Expected Discharge Plan:  [place]  In-House Referral:     Discharge planning Services  CM Consult  Post Acute Care Choice:  Home Health, Resumption of Svcs/PTA Provider Choice offered to:  Patient  DME Arranged:    DME Agency:     HH Arranged:  RN, PT [place] Agency:  [facility]  Status of Service:  Completed, signed off  If discussed at [place] of Stay Meetings, dates discussed:    Additional Comments:  Dawayne Patricia, RN 12/07/2015, 3:03 PM

## 2015-12-07 NOTE — Progress Notes (Signed)
ANTICOAGULATION CONSULT NOTE - follow up consult  Pharmacy Consult for Warfarin Indication: atrial fibrillation  Allergies  Allergen Reactions  . Codeine Anaphylaxis  . Sulfa Antibiotics Rash  . Darvon [Propoxyphene] Other (See Comments)  . Floxin [Ofloxacin] Other (See Comments)  . Levsin [Hyoscyamine Sulfate] Other (See Comments)  . Penicillins     Has patient had a PCN reaction causing immediate rash, facial/tongue/throat swelling, SOB or lightheadedness with hypotension: Yes Has patient had a PCN reaction causing severe rash involving mucus membranes or skin necrosis: No Has patient had a PCN reaction that required hospitalization No Has patient had a PCN reaction occurring within the last 10 years: No If all of the above answers are "NO", then may proceed with Cephalosporin use.   . Valium [Diazepam] Other (See Comments)  . Zantac [Ranitidine Hcl] Other (See Comments)    Patient Measurements: Height: 5\' 6"  (167.6 cm) Weight: 168 lb 10.4 oz (76.5 kg) IBW/kg (Calculated) : 59.3   Vital Signs: Temp: 97.5 F (36.4 C) (09/28 0616) Temp Source: Oral (09/28 0616) Pulse Rate: 72 (09/28 0616)  Labs:  Recent Labs  12/05/15 1035 12/06/15 0530 12/07/15 0321  HGB 11.0* 9.0*  --   HCT 37.5 30.2*  --   PLT 257 231  --   LABPROT 13.5 14.4 15.6*  INR 1.03 1.12 1.23  CREATININE 1.35* 1.05* 0.99    Estimated Creatinine Clearance: 45.8 mL/min (by C-G formula based on SCr of 0.99 mg/dL).     Assessment: 82yo female with history of Afib on warfarin PTA presents with Pharmacy is consulted to dose warfarin for atrial fibrillation. Pt recently held warfarin for GI procedure on 9/20. Restarted warfarin on 9/26.  PTA Warfarin Dose: 5mg  Mon and 2.5mg  AODs  Goal of Therapy:  INR 2-3 Monitor platelets by anticoagulation protocol: Yes   Plan:  Warfarin 5mg  PO tonight x1 Monitor daily INR, CBC, clinical course, s/sx of bleed, PO intake, DDI  Thank you Anette Guarneri,  PharmD [phone] 12/07/2015,10:27 AM

## 2015-12-07 NOTE — Discharge Instructions (Signed)
You need INR (coumadin labs ) in 2 days, please follow up with PCP or coumadin clinic.

## 2015-12-08 ENCOUNTER — Telehealth: Payer: Self-pay | Admitting: Internal Medicine

## 2015-12-08 DIAGNOSIS — F0390 Unspecified dementia without behavioral disturbance: Secondary | ICD-10-CM | POA: Diagnosis not present

## 2015-12-08 DIAGNOSIS — D649 Anemia, unspecified: Secondary | ICD-10-CM | POA: Diagnosis not present

## 2015-12-08 DIAGNOSIS — M1991 Primary osteoarthritis, unspecified site: Secondary | ICD-10-CM | POA: Diagnosis not present

## 2015-12-08 DIAGNOSIS — I10 Essential (primary) hypertension: Secondary | ICD-10-CM | POA: Diagnosis not present

## 2015-12-08 DIAGNOSIS — G8929 Other chronic pain: Secondary | ICD-10-CM | POA: Diagnosis not present

## 2015-12-08 DIAGNOSIS — M545 Low back pain: Secondary | ICD-10-CM | POA: Diagnosis not present

## 2015-12-08 NOTE — Telephone Encounter (Signed)
Okay to continue PT.

## 2015-12-08 NOTE — Telephone Encounter (Signed)
° °  [name] with [facility] to ask to continue home health pt for gait, endurance, strength, balance for 1 time a week for  1 week and 2 times a week for 4 weeks     [phone]

## 2015-12-08 NOTE — Telephone Encounter (Signed)
Okay to continue PT for Pt?

## 2015-12-09 DIAGNOSIS — D649 Anemia, unspecified: Secondary | ICD-10-CM | POA: Diagnosis not present

## 2015-12-09 DIAGNOSIS — I10 Essential (primary) hypertension: Secondary | ICD-10-CM | POA: Diagnosis not present

## 2015-12-09 DIAGNOSIS — M1991 Primary osteoarthritis, unspecified site: Secondary | ICD-10-CM | POA: Diagnosis not present

## 2015-12-09 DIAGNOSIS — G8929 Other chronic pain: Secondary | ICD-10-CM | POA: Diagnosis not present

## 2015-12-09 DIAGNOSIS — M545 Low back pain: Secondary | ICD-10-CM | POA: Diagnosis not present

## 2015-12-09 DIAGNOSIS — F0390 Unspecified dementia without behavioral disturbance: Secondary | ICD-10-CM | POA: Diagnosis not present

## 2015-12-11 ENCOUNTER — Telehealth: Payer: Self-pay | Admitting: Internal Medicine

## 2015-12-11 NOTE — Telephone Encounter (Signed)
Shanon Brow PT would like to have extended PT for 1 week x for a 1 week and 2 weeks for 4 weeks.  Verbal orders are good so that they can continue to do the PT.

## 2015-12-11 NOTE — Telephone Encounter (Signed)
Duplicate- see other message

## 2015-12-11 NOTE — Telephone Encounter (Signed)
Spoke to [facility] with [facility], verbal order given for PT for gait, endurance, strength, balance for 1 time a week for 1 week and 2 times a week for 4 weeks for pt okay per Dr.K. Shanon Brow verbalized understanding.

## 2015-12-12 ENCOUNTER — Ambulatory Visit (INDEPENDENT_AMBULATORY_CARE_PROVIDER_SITE_OTHER): Payer: Medicare Other | Admitting: General Practice

## 2015-12-12 DIAGNOSIS — D649 Anemia, unspecified: Secondary | ICD-10-CM | POA: Diagnosis not present

## 2015-12-12 DIAGNOSIS — Z5181 Encounter for therapeutic drug level monitoring: Secondary | ICD-10-CM

## 2015-12-12 DIAGNOSIS — I4891 Unspecified atrial fibrillation: Secondary | ICD-10-CM

## 2015-12-12 DIAGNOSIS — M1991 Primary osteoarthritis, unspecified site: Secondary | ICD-10-CM | POA: Diagnosis not present

## 2015-12-12 DIAGNOSIS — M545 Low back pain: Secondary | ICD-10-CM | POA: Diagnosis not present

## 2015-12-12 DIAGNOSIS — I10 Essential (primary) hypertension: Secondary | ICD-10-CM | POA: Diagnosis not present

## 2015-12-12 DIAGNOSIS — G8929 Other chronic pain: Secondary | ICD-10-CM | POA: Diagnosis not present

## 2015-12-12 DIAGNOSIS — F0390 Unspecified dementia without behavioral disturbance: Secondary | ICD-10-CM | POA: Diagnosis not present

## 2015-12-12 LAB — POCT INR: INR: 1.3

## 2015-12-13 ENCOUNTER — Other Ambulatory Visit: Payer: Self-pay | Admitting: Internal Medicine

## 2015-12-13 ENCOUNTER — Telehealth: Payer: Self-pay | Admitting: Internal Medicine

## 2015-12-13 NOTE — Telephone Encounter (Addendum)
Tracy Rodriguez is calling to let md know the pt is in independent living. Tracy Rodriguez thinks pt needs to be in assistant care. Pt is at [facility]. Pt needs help with bathing, medication management etc. Pt has history on depression. Pt is taking tramadol 100 mg bid instead of just 50 mg

## 2015-12-14 DIAGNOSIS — M1991 Primary osteoarthritis, unspecified site: Secondary | ICD-10-CM | POA: Diagnosis not present

## 2015-12-14 DIAGNOSIS — D649 Anemia, unspecified: Secondary | ICD-10-CM | POA: Diagnosis not present

## 2015-12-14 DIAGNOSIS — I10 Essential (primary) hypertension: Secondary | ICD-10-CM | POA: Diagnosis not present

## 2015-12-14 DIAGNOSIS — F0390 Unspecified dementia without behavioral disturbance: Secondary | ICD-10-CM | POA: Diagnosis not present

## 2015-12-14 DIAGNOSIS — M545 Low back pain: Secondary | ICD-10-CM | POA: Diagnosis not present

## 2015-12-14 DIAGNOSIS — G8929 Other chronic pain: Secondary | ICD-10-CM | POA: Diagnosis not present

## 2015-12-15 DIAGNOSIS — D649 Anemia, unspecified: Secondary | ICD-10-CM | POA: Diagnosis not present

## 2015-12-15 DIAGNOSIS — I10 Essential (primary) hypertension: Secondary | ICD-10-CM | POA: Diagnosis not present

## 2015-12-15 DIAGNOSIS — G8929 Other chronic pain: Secondary | ICD-10-CM | POA: Diagnosis not present

## 2015-12-15 DIAGNOSIS — M545 Low back pain: Secondary | ICD-10-CM | POA: Diagnosis not present

## 2015-12-15 DIAGNOSIS — F0390 Unspecified dementia without behavioral disturbance: Secondary | ICD-10-CM | POA: Diagnosis not present

## 2015-12-15 DIAGNOSIS — M1991 Primary osteoarthritis, unspecified site: Secondary | ICD-10-CM | POA: Diagnosis not present

## 2015-12-18 ENCOUNTER — Ambulatory Visit (INDEPENDENT_AMBULATORY_CARE_PROVIDER_SITE_OTHER): Payer: Medicare Other | Admitting: General Practice

## 2015-12-18 ENCOUNTER — Encounter: Payer: Self-pay | Admitting: Gastroenterology

## 2015-12-18 DIAGNOSIS — Z5181 Encounter for therapeutic drug level monitoring: Secondary | ICD-10-CM

## 2015-12-18 DIAGNOSIS — G8929 Other chronic pain: Secondary | ICD-10-CM | POA: Diagnosis not present

## 2015-12-18 DIAGNOSIS — F0390 Unspecified dementia without behavioral disturbance: Secondary | ICD-10-CM | POA: Diagnosis not present

## 2015-12-18 DIAGNOSIS — I10 Essential (primary) hypertension: Secondary | ICD-10-CM | POA: Diagnosis not present

## 2015-12-18 DIAGNOSIS — M1991 Primary osteoarthritis, unspecified site: Secondary | ICD-10-CM | POA: Diagnosis not present

## 2015-12-18 DIAGNOSIS — I4891 Unspecified atrial fibrillation: Secondary | ICD-10-CM

## 2015-12-18 DIAGNOSIS — D649 Anemia, unspecified: Secondary | ICD-10-CM | POA: Diagnosis not present

## 2015-12-18 DIAGNOSIS — M545 Low back pain: Secondary | ICD-10-CM | POA: Diagnosis not present

## 2015-12-19 DIAGNOSIS — G8929 Other chronic pain: Secondary | ICD-10-CM | POA: Diagnosis not present

## 2015-12-19 DIAGNOSIS — M545 Low back pain: Secondary | ICD-10-CM | POA: Diagnosis not present

## 2015-12-19 DIAGNOSIS — F0390 Unspecified dementia without behavioral disturbance: Secondary | ICD-10-CM | POA: Diagnosis not present

## 2015-12-19 DIAGNOSIS — M1991 Primary osteoarthritis, unspecified site: Secondary | ICD-10-CM | POA: Diagnosis not present

## 2015-12-19 DIAGNOSIS — D649 Anemia, unspecified: Secondary | ICD-10-CM | POA: Diagnosis not present

## 2015-12-19 DIAGNOSIS — I10 Essential (primary) hypertension: Secondary | ICD-10-CM | POA: Diagnosis not present

## 2015-12-21 ENCOUNTER — Telehealth: Payer: Self-pay | Admitting: Internal Medicine

## 2015-12-21 ENCOUNTER — Ambulatory Visit: Payer: Self-pay | Admitting: General Practice

## 2015-12-21 DIAGNOSIS — F0390 Unspecified dementia without behavioral disturbance: Secondary | ICD-10-CM | POA: Diagnosis not present

## 2015-12-21 DIAGNOSIS — D649 Anemia, unspecified: Secondary | ICD-10-CM | POA: Diagnosis not present

## 2015-12-21 DIAGNOSIS — Z5181 Encounter for therapeutic drug level monitoring: Secondary | ICD-10-CM

## 2015-12-21 DIAGNOSIS — I4891 Unspecified atrial fibrillation: Secondary | ICD-10-CM

## 2015-12-21 DIAGNOSIS — I10 Essential (primary) hypertension: Secondary | ICD-10-CM | POA: Diagnosis not present

## 2015-12-21 DIAGNOSIS — G8929 Other chronic pain: Secondary | ICD-10-CM | POA: Diagnosis not present

## 2015-12-21 DIAGNOSIS — M545 Low back pain: Secondary | ICD-10-CM | POA: Diagnosis not present

## 2015-12-21 DIAGNOSIS — M1991 Primary osteoarthritis, unspecified site: Secondary | ICD-10-CM | POA: Diagnosis not present

## 2015-12-21 LAB — POCT INR: INR: 2

## 2015-12-21 NOTE — Progress Notes (Signed)
I have reviewed and agree with the plan. 

## 2015-12-21 NOTE — Telephone Encounter (Signed)
[facility] - Client [place] [facility] Patient Name: Tracy Rodriguez DOB: February 22, 1932 Initial Comment Caller states mother has been laying in her bed all day and she has been having issues walking. Nurse Assessment Nurse: Germain Osgood, RN, Opal Sidles Date/Time Eilene Ghazi Time): 12/21/2015 4:43:00 PM Confirm and document reason for call. If symptomatic, describe symptoms. You must click the next button to save text entered. ---Patient states she was released from hospital at the end of last week Has been having PT and OT and is getting weaker. Today was at the hair dresser and could not remain very long due to dizziness. Takes Blood Pressure Medication. Feels dizzy when tries to get up. Has the patient traveled out of the country within the last 30 days? ---No Does the patient have any new or worsening symptoms? ---Yes Will a triage be completed? ---Yes Related visit to physician within the last 2 weeks? ---Yes Does the PT have any chronic conditions? (i.e. diabetes, asthma, etc.) ---Yes List chronic conditions. ---Hypertension, anemia hx, on Coumadin, Bipolar disorder, Tramadol for back pain. Is this a behavioral health or substance abuse call? ---No Guidelines Guideline Title Affirmed Question Affirmed Notes Dizziness - Lightheadedness [1] MODERATE dizziness (e.g., interferes with normal activities) AND [2] has NOT been evaluated by physician for this (Exception: dizziness caused by heat exposure, sudden standing, or poor fluid intake) Final Disposition User See Physician within [place], RN, Opal Sidles Comments Appointment made for Friday 2:30 PM with Dr. Garret Reddish. Referrals REFERRED TO PCP OFFICE Disagree/Comply: Comply

## 2015-12-21 NOTE — Telephone Encounter (Signed)
Noted. Appointment with Dr. Yong Channel Friday, October 13th at 2:30pm in Dr. Marthann Schiller absence

## 2015-12-22 ENCOUNTER — Ambulatory Visit (INDEPENDENT_AMBULATORY_CARE_PROVIDER_SITE_OTHER): Payer: Medicare Other | Admitting: Family Medicine

## 2015-12-22 ENCOUNTER — Encounter: Payer: Self-pay | Admitting: Family Medicine

## 2015-12-22 VITALS — BP 98/52 | HR 62 | Temp 97.7°F | Wt 173.0 lb

## 2015-12-22 DIAGNOSIS — R5383 Other fatigue: Secondary | ICD-10-CM | POA: Diagnosis not present

## 2015-12-22 DIAGNOSIS — D509 Iron deficiency anemia, unspecified: Secondary | ICD-10-CM | POA: Diagnosis not present

## 2015-12-22 DIAGNOSIS — N3000 Acute cystitis without hematuria: Secondary | ICD-10-CM

## 2015-12-22 LAB — CBC WITH DIFFERENTIAL/PLATELET
Basophils Absolute: 0 cells/uL (ref 0–200)
Basophils Relative: 0 %
Eosinophils Absolute: 69 cells/uL (ref 15–500)
Eosinophils Relative: 1 %
HCT: 30.8 % — ABNORMAL LOW (ref 35.0–45.0)
Hemoglobin: 9.4 g/dL — ABNORMAL LOW (ref 11.7–15.5)
Lymphocytes Relative: 25 %
Lymphs Abs: 1725 cells/uL (ref 850–3900)
MCH: 23.6 pg — ABNORMAL LOW (ref 27.0–33.0)
MCHC: 30.5 g/dL — ABNORMAL LOW (ref 32.0–36.0)
MCV: 77.4 fL — ABNORMAL LOW (ref 80.0–100.0)
MPV: 8.9 fL (ref 7.5–12.5)
Monocytes Absolute: 621 cells/uL (ref 200–950)
Monocytes Relative: 9 %
Neutro Abs: 4485 cells/uL (ref 1500–7800)
Neutrophils Relative %: 65 %
Platelets: 271 10*3/uL (ref 140–400)
RBC: 3.98 MIL/uL (ref 3.80–5.10)
RDW: 18 % — ABNORMAL HIGH (ref 11.0–15.0)
WBC: 6.9 10*3/uL (ref 3.8–10.8)

## 2015-12-22 NOTE — Progress Notes (Signed)
Subjective:  Tykeya Anthis is a 82 y.o. year old very pleasant female patient who presents for/with See problem oriented charting ROS- .see any ROS included in HPI  Past Medical History-  Patient Active Problem List   Diagnosis Date Noted  . Syncope 12/05/2015  . Physical deconditioning 12/05/2015  . Acute kidney injury ([place]) 12/05/2015  . Dehydration, moderate 12/05/2015  . Acute hypokalemia 12/05/2015  . Abnormal chest x-ray 12/05/2015  . Encounter for therapeutic drug monitoring 12/22/2014  . Atrial fibrillation with RVR ([place]) 08/04/2014  . Chronic atrial fibrillation ([place]) 08/04/2014  . Essential hypertension   . Obesity 09/27/2013  . Chronic back pain 09/14/2012  . Bipolar disorder ([place]) 09/14/2012    Medications- reviewed and updated Current Outpatient Prescriptions  Medication Sig Dispense Refill  . atorvastatin (LIPITOR) 20 MG tablet Take 1 tablet (20 mg total) by mouth daily. 90 tablet 3  . diltiazem (DILACOR XR) 120 MG 24 hr capsule Take 1 capsule (120 mg total) by mouth daily. 90 capsule 3  . escitalopram (LEXAPRO) 10 MG tablet TAKE 1 TABLET(10 MG) BY MOUTH DAILY (Patient taking differently: Take 10 mg by mouth once a day) 90 tablet 0  . gabapentin (NEURONTIN) 300 MG capsule Take 1 capsule (300 mg total) by mouth 2 (two) times daily. 180 capsule 3  . mirabegron ER (MYRBETRIQ) 25 MG TB24 tablet Take 1 tablet (25 mg total) by mouth daily. 30 tablet 3  . QUEtiapine (SEROQUEL) 25 MG tablet Take 1 tablet (25 mg total) by mouth 2 (two) times daily. 180 tablet 1  . traMADol (ULTRAM) 50 MG tablet Take 1 tablet (50 mg total) by mouth every 6 (six) hours as needed. (Patient taking differently: Take 100 mg by mouth 2 (two) times daily. ) 90 tablet 2  . traZODone (DESYREL) 150 MG tablet TAKE 1 TABLET BY MOUTH EVERY NIGHT AT BEDTIME (Patient taking differently: Take 150 mg by mouth at bedtime) 90 tablet 2  . warfarin (COUMADIN) 5 MG tablet Take as directed by anticoagulation clinic.  (Patient taking differently: Take 2.5-5 mg by mouth See admin instructions. 2.5 mg in the morning on Sun/Tues/Wed/Thurs/Fri/Sat and 5 mg on Mon) 30 tablet 3   No current facility-administered medications for this visit.     Objective: BP (!) 98/52 (BP Location: Left Arm, Patient Position: Sitting, Cuff Size: Normal)   Pulse 62   Temp 97.7 F (36.5 C) (Oral)   Wt 173 lb (78.5 kg)   LMP 10/10/1969   SpO2 95%   BMI 27.92 kg/m  Gen: NAD, resting comfortably, appears fatigued, stands slowly but walks into office on her own No JVD CV: irregularly irregular no murmurs rubs or gallops Lungs: CTAB no crackles, wheeze, rhonchi Abdomen: soft/nontender/nondistended/normal bowel sounds.  Ext: no edema Skin: warm, dry, no rash  Assessment/Plan:  Other fatigue - Iron deficiency anemia Dehydration Acute cystitis without hematuria - suspect resolved S: Patient was hospitalized from 9/26-9/28/17 due to dizziness, physical deconditioning, with syncopal episode. At baseline she has a fib on coumadin, HTN, bipolar, chronic back pain. History of a fib with RVR when was fluid overloaded in past and she had been on lasix. During recent hospitalization she had lasix discontinued as dehydration was a concern. She also was noted to have a UTI and completed cephalosporin treatment. She has continued PT at home since discharge but each day out of hospital (where she was given fluids) she has seemed to have progressive weakness. Yesterday she didn't even want to go to hair appointment which  is very odd for her. Has had intermittent dizziness especially with position changes and some fatigue.   Daughter was under impression that patients anemia had improved from baseline as it was 11 when initially hospitalized but next day review of chart shows had dipped to 9. She had a colonoscopy and EGD 3-4 weeks ago without clear apparent cause yet her ferritin was 8.9 in august.  ROS- no chest pain, shortness of breath,  increasing edema A/P: I was worried at first that patient may be fluid overloaded as weight up 4 lbs since last visit and recent stoppage of lasix but she has no JVD, no crackles, no edema and in fact on exam her lips appear dry- clinically on dry side. We were trying to set patient up for 500 cc bolus in office then reassess but did not have all available equipment. We strongly encouraged patient to push fluids with plan for 4 oz each waking hour which would get her up to 60 oz per day with reassessment next Tuesday by Dr. Raliegh Ip. Also will start iron OTC BID. Both daughter and I expressed concern that patient may end up back in hospital over the weekend and we discussed strict return precautions. She had recent UTI and daughter asks about repeat urine culture in case recurrent- I do not have strong suspicion for UTI as patient not really symptomatic but did agree to get culture then reassess. I suspect anemia could be strong contributor. In addition thought about reducing BP meds but all that is left is diltiazem and has had a fib with RVR in past . Update labs as below  Orders Placed This Encounter  Procedures  . Urine culture    [facility]  . Comprehensive metabolic panel  . CBC with Differential/Platelet  . Ferritin   The duration of face-to-face time during this visit was greater than 30 minutes. Greater than 50% of this time was spent in counseling on level of concern for patient and need to remain better hydrating balancing this with concern for potential fluid overload, discussing return precautions  Emergent/urgent Return precautions advised.  Garret Reddish, MD

## 2015-12-22 NOTE — Progress Notes (Signed)
Pre visit review using our clinic review tool, if applicable. No additional management support is needed unless otherwise documented below in the visit note. 

## 2015-12-22 NOTE — Patient Instructions (Addendum)
Labs before you leave- should find out by Monday if any UTI  We had hoped to give you some IV fluids but unfortunately did not have available equipment  I want you to make sure she gets at least 60 oz of fluids over next 24 hours. I considered holding diltiazem but am concerned about history of atrial fibrillation with rapid ventricular response  I will call you if anemia has worsened- unfortunately I am worried may end up in hospital over the weekend

## 2015-12-23 LAB — COMPREHENSIVE METABOLIC PANEL
ALT: 7 U/L (ref 6–29)
AST: 15 U/L (ref 10–35)
Albumin: 3.6 g/dL (ref 3.6–5.1)
Alkaline Phosphatase: 68 U/L (ref 33–130)
BUN: 17 mg/dL (ref 7–25)
CO2: 23 mmol/L (ref 20–31)
Calcium: 8.6 mg/dL (ref 8.6–10.4)
Chloride: 107 mmol/L (ref 98–110)
Creat: 1.16 mg/dL — ABNORMAL HIGH (ref 0.60–0.88)
Glucose, Bld: 89 mg/dL (ref 65–99)
Potassium: 4.2 mmol/L (ref 3.5–5.3)
Sodium: 141 mmol/L (ref 135–146)
Total Bilirubin: 0.2 mg/dL (ref 0.2–1.2)
Total Protein: 6.4 g/dL (ref 6.1–8.1)

## 2015-12-23 LAB — FERRITIN: Ferritin: 9 ng/mL — ABNORMAL LOW (ref 20–288)

## 2015-12-24 LAB — URINE CULTURE

## 2015-12-25 NOTE — Telephone Encounter (Signed)
Left message on voicemail to call office. Need to discuss pain medication dosage.

## 2015-12-25 NOTE — Telephone Encounter (Signed)
Decrease tramadol to 50 mg twice a day

## 2015-12-25 NOTE — Telephone Encounter (Signed)
Please see message. °

## 2015-12-26 ENCOUNTER — Ambulatory Visit: Payer: Medicare Other | Admitting: Internal Medicine

## 2015-12-26 DIAGNOSIS — G8929 Other chronic pain: Secondary | ICD-10-CM | POA: Diagnosis not present

## 2015-12-26 DIAGNOSIS — M545 Low back pain: Secondary | ICD-10-CM | POA: Diagnosis not present

## 2015-12-26 DIAGNOSIS — F0390 Unspecified dementia without behavioral disturbance: Secondary | ICD-10-CM | POA: Diagnosis not present

## 2015-12-26 DIAGNOSIS — M1991 Primary osteoarthritis, unspecified site: Secondary | ICD-10-CM | POA: Diagnosis not present

## 2015-12-26 DIAGNOSIS — D649 Anemia, unspecified: Secondary | ICD-10-CM | POA: Diagnosis not present

## 2015-12-26 DIAGNOSIS — I10 Essential (primary) hypertension: Secondary | ICD-10-CM | POA: Diagnosis not present

## 2015-12-27 NOTE — Telephone Encounter (Signed)
Left message on personal voicemail to call office regarding tramadol medication.

## 2015-12-28 ENCOUNTER — Telehealth: Payer: Self-pay | Admitting: Internal Medicine

## 2015-12-28 ENCOUNTER — Ambulatory Visit: Payer: Self-pay | Admitting: General Practice

## 2015-12-28 DIAGNOSIS — F0390 Unspecified dementia without behavioral disturbance: Secondary | ICD-10-CM | POA: Diagnosis not present

## 2015-12-28 DIAGNOSIS — M545 Low back pain: Secondary | ICD-10-CM | POA: Diagnosis not present

## 2015-12-28 DIAGNOSIS — I10 Essential (primary) hypertension: Secondary | ICD-10-CM | POA: Diagnosis not present

## 2015-12-28 DIAGNOSIS — Z5181 Encounter for therapeutic drug level monitoring: Secondary | ICD-10-CM

## 2015-12-28 DIAGNOSIS — M1991 Primary osteoarthritis, unspecified site: Secondary | ICD-10-CM | POA: Diagnosis not present

## 2015-12-28 DIAGNOSIS — G8929 Other chronic pain: Secondary | ICD-10-CM | POA: Diagnosis not present

## 2015-12-28 DIAGNOSIS — D649 Anemia, unspecified: Secondary | ICD-10-CM | POA: Diagnosis not present

## 2015-12-28 DIAGNOSIS — I4891 Unspecified atrial fibrillation: Secondary | ICD-10-CM

## 2015-12-28 LAB — POCT INR: INR: 2.3

## 2015-12-28 NOTE — Telephone Encounter (Signed)
Dosing orders have been called in to [name], Rn @ [facility].

## 2015-12-28 NOTE — Telephone Encounter (Signed)
Pt returning your call. Phone:  [phone]

## 2015-12-28 NOTE — Telephone Encounter (Signed)
Spoke to pt, told her Dr.K she needs to decrease Tramadol to 50 mg one tablet twice a day not two tablets at a time. Pt verbalized understanding, but said it does not help pain. Told pt she will need to discuss at visit pain medication. Pt verbalized understanding and said she has an appt next week. Told her okay.

## 2015-12-28 NOTE — Progress Notes (Signed)
I have reviewed and agree with the plan. 

## 2015-12-28 NOTE — Telephone Encounter (Signed)
Tracy Rodriguez with [facility] reports pts INR 2.3

## 2015-12-29 DIAGNOSIS — F0390 Unspecified dementia without behavioral disturbance: Secondary | ICD-10-CM | POA: Diagnosis not present

## 2015-12-29 DIAGNOSIS — D649 Anemia, unspecified: Secondary | ICD-10-CM | POA: Diagnosis not present

## 2015-12-29 DIAGNOSIS — G8929 Other chronic pain: Secondary | ICD-10-CM | POA: Diagnosis not present

## 2015-12-29 DIAGNOSIS — I10 Essential (primary) hypertension: Secondary | ICD-10-CM | POA: Diagnosis not present

## 2015-12-29 DIAGNOSIS — M1991 Primary osteoarthritis, unspecified site: Secondary | ICD-10-CM | POA: Diagnosis not present

## 2015-12-29 DIAGNOSIS — M545 Low back pain: Secondary | ICD-10-CM | POA: Diagnosis not present

## 2016-01-01 DIAGNOSIS — M1991 Primary osteoarthritis, unspecified site: Secondary | ICD-10-CM | POA: Diagnosis not present

## 2016-01-01 DIAGNOSIS — D649 Anemia, unspecified: Secondary | ICD-10-CM | POA: Diagnosis not present

## 2016-01-01 DIAGNOSIS — M545 Low back pain: Secondary | ICD-10-CM | POA: Diagnosis not present

## 2016-01-01 DIAGNOSIS — I10 Essential (primary) hypertension: Secondary | ICD-10-CM | POA: Diagnosis not present

## 2016-01-01 DIAGNOSIS — G8929 Other chronic pain: Secondary | ICD-10-CM | POA: Diagnosis not present

## 2016-01-01 DIAGNOSIS — F0390 Unspecified dementia without behavioral disturbance: Secondary | ICD-10-CM | POA: Diagnosis not present

## 2016-01-02 ENCOUNTER — Encounter: Payer: Self-pay | Admitting: Internal Medicine

## 2016-01-02 ENCOUNTER — Ambulatory Visit (INDEPENDENT_AMBULATORY_CARE_PROVIDER_SITE_OTHER): Payer: Medicare Other | Admitting: Internal Medicine

## 2016-01-02 VITALS — BP 130/64 | HR 69 | Temp 97.3°F | Resp 18 | Ht 66.0 in | Wt 175.5 lb

## 2016-01-02 DIAGNOSIS — I10 Essential (primary) hypertension: Secondary | ICD-10-CM | POA: Diagnosis not present

## 2016-01-02 DIAGNOSIS — I4891 Unspecified atrial fibrillation: Secondary | ICD-10-CM | POA: Diagnosis not present

## 2016-01-02 DIAGNOSIS — R9389 Abnormal findings on diagnostic imaging of other specified body structures: Secondary | ICD-10-CM

## 2016-01-02 DIAGNOSIS — I482 Chronic atrial fibrillation, unspecified: Secondary | ICD-10-CM

## 2016-01-02 DIAGNOSIS — R938 Abnormal findings on diagnostic imaging of other specified body structures: Secondary | ICD-10-CM | POA: Diagnosis not present

## 2016-01-02 MED ORDER — DIGOXIN 125 MCG PO TABS: 125.0000 ug | ORAL_TABLET | Freq: Every day | ORAL | 3 refills | Status: DC

## 2016-01-02 MED ORDER — TRAMADOL HCL 50 MG PO TABS: 50.0000 mg | ORAL_TABLET | Freq: Four times a day (QID) | ORAL | 2 refills | Status: DC | PRN

## 2016-01-02 NOTE — Progress Notes (Signed)
Subjective:    Patient ID: Tracy Rodriguez, female    DOB: 1931-09-16, 82 y.o.   MRN: [id number]  HPI  Admit date: 12/05/2015 Discharge date: 12/07/2015  Recommendations for Outpatient Follow-up:  1. Follow up with PCP in 1-2 weeks 2. Please obtain BMP/CBC in one week 3. Needs further evaluation of pulmonary nodule.  4. Further evaluation of orthostatic hypotension.     1-Syncope;  Suspect related to orthostatic hypotension, dehydration.  Improved with V fluids.  Adjust BP medications. Discontinue lasix.  ECHO ordered.   cortisol level normal.  Discharge today after she works with PT and if ECHO stable.  orthostatics vital improved, still with orthostatics but asymptomatic at this time.   2-AKI; continue with IV fluids. Resolved.   3-Lung nodule;needs CT. Family considering. Needs further discussion   4-A fib;coumadin per pharmacy.  Cardizem low dose.  need INR in 48 hours.   5-UTI;UA with too numerous to count WBC. Received IV ceftriaxone for 2 days. Discharge on keflex for 2 more days   6-Essential hypertension/mildtricuspid regurgitation -Patient had previous admission for congestive heart failure in setting of atrial fibrillation with RVR and was sent home on low dose Lasix daily hold lasix.   7-Bipolar disorder  -Continue Lexapro and trazodone  Discharge Diagnoses:  Principal Problem:   Syncope Active Problems:   Chronic back pain   Bipolar disorder (HCC)   Chronic atrial fibrillation (HCC)   Essential hypertension   Physical deconditioning   Acute kidney injury ([place])   Dehydration, moderate   Acute hypokalemia   Abnormal chest x-ray  82 year old patient who is seen today in follow-up.  She had a recent hospital discharge approximately 3 weeks ago.  Hospital records reviewed She continues to have some orthostatic dizziness.  She does have a history of chronic atrial fibrillation and episodes of deterioration with rapid ventricular  response.  She remains on diltiazem.  Patient was noted have an abnormal chest x-ray with a new nodule in the right upper lung field.  This was not present in a chest x-ray in June.  The patient does have remote history of breast cancer but no history tobacco use  Hospital course complicated by anemia.  The patient has been on iron supplements.  She remains on chronic Coumadin anticoagulation.  She continues to have chronic low back pain  Past Medical History:  Diagnosis Date  . Arthritis    "joints" (08/04/2014)  . Bipolar 1 disorder ([place])   . Cancer of right breast ([place])   . Chronic lower back pain   . Hyperlipemia   . Hypertension   . PAF (paroxysmal atrial fibrillation) ([place])      Social History   Social History  . Marital status: Widowed    Spouse name: N/A  . Number of children: N/A  . Years of education: N/A   Occupational History  . Not on file.   Social History Main Topics  . Smoking status: Never Smoker  . Smokeless tobacco: Never Used  . Alcohol use No  . Drug use: No  . Sexual activity: No   Other Topics Concern  . Not on file   Social History Narrative  . No narrative on file    Past Surgical History:  Procedure Laterality Date  . BACK SURGERY    . BREAST BIOPSY Right   . BREAST RECONSTRUCTION Right   . CATARACT EXTRACTION W/ INTRAOCULAR LENS  IMPLANT, BILATERAL Bilateral   . EXCISIONAL HEMORRHOIDECTOMY    . LUMBAR DISC SURGERY  [id number]; 1973; 1985   "ruptured discs each time"  . MASTECTOMY Right    cancer  . PLACEMENT OF BREAST IMPLANTS Bilateral    "had to take tissue out of left"  . TONSILLECTOMY    . VAGINAL HYSTERECTOMY      Family History  Problem Relation Age of Onset  . Stroke Mother   . Heart disease Father   . Alzheimer's disease Sister   . Leukemia Brother   . Arthritis Maternal Grandmother   . Breast cancer Daughter     Allergies  Allergen Reactions  . Codeine Anaphylaxis  . Sulfa Antibiotics Rash  . Darvon  [Propoxyphene] Other (See Comments)  . Floxin [Ofloxacin] Other (See Comments)  . Levsin [Hyoscyamine Sulfate] Other (See Comments)  . Penicillins     Has patient had a PCN reaction causing immediate rash, facial/tongue/throat swelling, SOB or lightheadedness with hypotension: Yes Has patient had a PCN reaction causing severe rash involving mucus membranes or skin necrosis: No Has patient had a PCN reaction that required hospitalization No Has patient had a PCN reaction occurring within the last 10 years: No If all of the above answers are "NO", then may proceed with Cephalosporin use.   . Valium [Diazepam] Other (See Comments)  . Zantac [Ranitidine Hcl] Other (See Comments)    Current Outpatient Prescriptions on File Prior to Visit  Medication Sig Dispense Refill  . atorvastatin (LIPITOR) 20 MG tablet Take 1 tablet (20 mg total) by mouth daily. 90 tablet 3  . diltiazem (DILACOR XR) 120 MG 24 hr capsule Take 1 capsule (120 mg total) by mouth daily. 90 capsule 3  . escitalopram (LEXAPRO) 10 MG tablet TAKE 1 TABLET(10 MG) BY MOUTH DAILY (Patient taking differently: Take 10 mg by mouth once a day) 90 tablet 0  . gabapentin (NEURONTIN) 300 MG capsule Take 1 capsule (300 mg total) by mouth 2 (two) times daily. 180 capsule 3  . mirabegron ER (MYRBETRIQ) 25 MG TB24 tablet Take 1 tablet (25 mg total) by mouth daily. 30 tablet 3  . QUEtiapine (SEROQUEL) 25 MG tablet Take 1 tablet (25 mg total) by mouth 2 (two) times daily. 180 tablet 1  . traMADol (ULTRAM) 50 MG tablet Take 1 tablet (50 mg total) by mouth every 6 (six) hours as needed. (Patient taking differently: Take 100 mg by mouth 2 (two) times daily. ) 90 tablet 2  . traZODone (DESYREL) 150 MG tablet TAKE 1 TABLET BY MOUTH EVERY NIGHT AT BEDTIME (Patient taking differently: Take 150 mg by mouth at bedtime) 90 tablet 2  . warfarin (COUMADIN) 5 MG tablet Take as directed by anticoagulation clinic. (Patient taking differently: Take 2.5-5 mg by mouth  See admin instructions. 2.5 mg in the morning on Sun/Tues/Wed/Thurs/Fri/Sat and 5 mg on Mon) 30 tablet 3   No current facility-administered medications on file prior to visit.     BP 130/64 (BP Location: Left Arm, Patient Position: Sitting, Cuff Size: Normal)   Pulse 69   Temp 97.3 F (36.3 C) (Oral)   Resp 18   Ht 5\' 6"  (1.676 m)   Wt 175 lb 8 oz (79.6 kg)   LMP 10/10/1969   SpO2 97%   BMI 28.33 kg/m     Review of Systems  Constitutional: Positive for fatigue.  HENT: Negative for congestion, dental problem, hearing loss, rhinorrhea, sinus pressure, sore throat and tinnitus.   Eyes: Negative for pain, discharge and visual disturbance.  Respiratory: Negative for cough and shortness of breath.   Cardiovascular:  Negative for chest pain, palpitations and leg swelling.  Gastrointestinal: Negative for abdominal distention, abdominal pain, blood in stool, constipation, diarrhea, nausea and vomiting.  Genitourinary: Negative for difficulty urinating, dysuria, flank pain, frequency, hematuria, pelvic pain, urgency, vaginal bleeding, vaginal discharge and vaginal pain.  Musculoskeletal: Positive for back pain. Negative for arthralgias, gait problem and joint swelling.  Skin: Negative for rash.  Neurological: Positive for weakness and light-headedness. Negative for dizziness, syncope, speech difficulty, numbness and headaches.  Hematological: Negative for adenopathy.  Psychiatric/Behavioral: Negative for agitation, behavioral problems and dysphoric mood. The patient is not nervous/anxious.        Objective:   Physical Exam  Constitutional: She is oriented to person, place, and time. She appears well-developed and well-nourished.  Blood pressure sitting 110 over 60  HENT:  Head: Normocephalic.  Right Ear: External ear normal.  Left Ear: External ear normal.  Mouth/Throat: Oropharynx is clear and moist.  Eyes: Conjunctivae and EOM are normal. Pupils are equal, round, and reactive to  light.  Neck: Normal range of motion. Neck supple. No thyromegaly present.  Cardiovascular: Normal rate, regular rhythm, normal heart sounds and intact distal pulses.   Rhythm regular  Pulmonary/Chest: Effort normal and breath sounds normal. No respiratory distress. She has no wheezes. She has no rales.  Abdominal: Soft. Bowel sounds are normal. She exhibits no mass. There is no tenderness.  Musculoskeletal: Normal range of motion.  Lymphadenopathy:    She has no cervical adenopathy.  Neurological: She is alert and oriented to person, place, and time.  Skin: Skin is warm and dry. No rash noted.  Psychiatric: She has a normal mood and affect. Her behavior is normal.          Assessment & Plan:   Atrial fibrillation.  We'll switch to Lanoxin 0.125 milligrams daily for rate control History of essential hypertension.  Blood pressure remains low normal on diltiazem with persistent orthostatic symptoms Anemia.  Check CBC and electrolytes History abnormal chest x-ray.  Options discussed.  Chest x-ray did not reveal this abnormality in June.  We'll recheck a chest x-ray in 3 months  [id number],PETER [name]

## 2016-01-02 NOTE — Patient Instructions (Signed)
Drink as much fluid as you  can tolerate over the next few days  Discontinue diltiazem   Return in 2 months for follow-up

## 2016-01-04 DIAGNOSIS — M545 Low back pain: Secondary | ICD-10-CM | POA: Diagnosis not present

## 2016-01-04 DIAGNOSIS — M1991 Primary osteoarthritis, unspecified site: Secondary | ICD-10-CM | POA: Diagnosis not present

## 2016-01-04 DIAGNOSIS — I10 Essential (primary) hypertension: Secondary | ICD-10-CM | POA: Diagnosis not present

## 2016-01-04 DIAGNOSIS — D649 Anemia, unspecified: Secondary | ICD-10-CM | POA: Diagnosis not present

## 2016-01-04 DIAGNOSIS — G8929 Other chronic pain: Secondary | ICD-10-CM | POA: Diagnosis not present

## 2016-01-04 DIAGNOSIS — F0390 Unspecified dementia without behavioral disturbance: Secondary | ICD-10-CM | POA: Diagnosis not present

## 2016-01-04 LAB — POCT INR: INR: 1.8

## 2016-01-05 ENCOUNTER — Ambulatory Visit (INDEPENDENT_AMBULATORY_CARE_PROVIDER_SITE_OTHER): Payer: Self-pay | Admitting: General Practice

## 2016-01-05 DIAGNOSIS — Z5181 Encounter for therapeutic drug level monitoring: Secondary | ICD-10-CM

## 2016-01-05 DIAGNOSIS — I4891 Unspecified atrial fibrillation: Secondary | ICD-10-CM

## 2016-01-09 ENCOUNTER — Telehealth: Payer: Self-pay | Admitting: Internal Medicine

## 2016-01-09 NOTE — Telephone Encounter (Signed)
Beth, pt's daughter would like to come in and discuss with you pt's ongoing issues. Pt's other daughter Warren Lacy will be in town Nov 15, 16, and 17 and she would like to come in with her as well one of these 3 days. Pt will not be with them.  They are both on the DPR. They want to discuss memory issues, behavior, and if pt may need o transfer out of independent living.   Please advise if ok to schedule, and the best time to do this. Thank you.

## 2016-01-09 NOTE — Patient Instructions (Signed)
Pre visit review using our clinic review tool, if applicable. No additional management support is needed unless otherwise documented below in the visit note. 

## 2016-01-09 NOTE — Telephone Encounter (Signed)
Tracy Rodriguez, that is fine but only day available is the 15th. He is out of town the rest of the week.

## 2016-01-09 NOTE — Progress Notes (Signed)
I have reviewed and agree with the plan. 

## 2016-01-10 NOTE — Telephone Encounter (Signed)
appt has been scheduled. The last appt on wed. 11/15.

## 2016-01-12 ENCOUNTER — Encounter: Payer: Self-pay | Admitting: Adult Health

## 2016-01-12 ENCOUNTER — Ambulatory Visit (INDEPENDENT_AMBULATORY_CARE_PROVIDER_SITE_OTHER): Payer: Medicare Other | Admitting: Adult Health

## 2016-01-12 VITALS — BP 142/88 | Temp 98.6°F | Ht 66.0 in | Wt 170.2 lb

## 2016-01-12 DIAGNOSIS — R42 Dizziness and giddiness: Secondary | ICD-10-CM

## 2016-01-12 DIAGNOSIS — R195 Other fecal abnormalities: Secondary | ICD-10-CM | POA: Diagnosis not present

## 2016-01-12 LAB — CBC WITH DIFFERENTIAL/PLATELET
Basophils Absolute: 0 cells/uL (ref 0–200)
Basophils Relative: 0 %
Eosinophils Absolute: 65 cells/uL (ref 15–500)
Eosinophils Relative: 1 %
HCT: 35.1 % (ref 35.0–45.0)
Hemoglobin: 10.8 g/dL — ABNORMAL LOW (ref 11.7–15.5)
Lymphocytes Relative: 18 %
Lymphs Abs: 1170 cells/uL (ref 850–3900)
MCH: 24.5 pg — ABNORMAL LOW (ref 27.0–33.0)
MCHC: 30.8 g/dL — ABNORMAL LOW (ref 32.0–36.0)
MCV: 79.8 fL — ABNORMAL LOW (ref 80.0–100.0)
MPV: 9.5 fL (ref 7.5–12.5)
Monocytes Absolute: 585 cells/uL (ref 200–950)
Monocytes Relative: 9 %
Neutro Abs: 4680 cells/uL (ref 1500–7800)
Neutrophils Relative %: 72 %
Platelets: 262 10*3/uL (ref 140–400)
RBC: 4.4 MIL/uL (ref 3.80–5.10)
RDW: 21.3 % — ABNORMAL HIGH (ref 11.0–15.0)
WBC: 6.5 10*3/uL (ref 3.8–10.8)

## 2016-01-12 NOTE — Progress Notes (Signed)
Subjective:    Patient ID: Tracy Rodriguez, female    DOB: December 27, 1931, 82 y.o.   MRN: [id number]  HPI  This is an 82 year old female who  has a past medical history of Arthritis; Bipolar 1 disorder ([place]); Cancer of right breast ([place]); Chronic lower back pain; Hyperlipemia; Hypertension; and PAF (paroxysmal atrial fibrillation) ([place]). She presents to the office today with her daughter for multiplecomplaints  1. Dizziness - Daughter reports that this is an ongoing issue with the patient is been seen by Dr. Raliegh Ip and in the ER multiple times for this issue. Most recently patient woke up this morning feeling extremely dizzy to the point where she cannot get out of bed. She reports" it's like the room is spinning". She denies any nausea vomiting or diarrhea. One day prior she had no dizziness. Dizziness happens more when she is changing positions or moving her head. Her family reports that she's been taking digoxin and diltiazem together for the last 2 days. She does report drinking fluids throughout the day but only approximately 30 mls.   2. Dark Stools - She reports that over the course of the week she's had very dark stools. She has been on an iron supplement but quit that this week when she first started noticing dark stools. Her last dark stools this morning and she has not had a bowel movement since. She denies any abdominal cramping. He is worried about internal bleeding  Review of Systems  Constitutional: Positive for activity change. Negative for chills, diaphoresis and fever.  HENT: Negative.   Eyes: Negative.   Respiratory: Negative.   Cardiovascular: Negative.   Gastrointestinal: Positive for anal bleeding (?). Negative for abdominal distention, abdominal pain, blood in stool, constipation, diarrhea, nausea, rectal pain and vomiting.       Dark stools  Musculoskeletal: Negative.   Skin: Negative.   Neurological: Positive for dizziness. Negative for seizures, facial asymmetry, speech  difficulty, light-headedness, numbness and headaches.  Hematological: Negative.   All other systems reviewed and are negative.  Past Medical History:  Diagnosis Date  . Arthritis    "joints" (08/04/2014)  . Bipolar 1 disorder ([place])   . Cancer of right breast ([place])   . Chronic lower back pain   . Hyperlipemia   . Hypertension   . PAF (paroxysmal atrial fibrillation) ([place])     Social History   Social History  . Marital status: Widowed    Spouse name: N/A  . Number of children: N/A  . Years of education: N/A   Occupational History  . Not on file.   Social History Main Topics  . Smoking status: Never Smoker  . Smokeless tobacco: Never Used  . Alcohol use No  . Drug use: No  . Sexual activity: No   Other Topics Concern  . Not on file   Social History Narrative  . No narrative on file    Past Surgical History:  Procedure Laterality Date  . BACK SURGERY    . BREAST BIOPSY Right   . BREAST RECONSTRUCTION Right   . CATARACT EXTRACTION W/ INTRAOCULAR LENS  IMPLANT, BILATERAL Bilateral   . EXCISIONAL HEMORRHOIDECTOMY    . [place]; 1973; 1985   "ruptured discs each time"  . MASTECTOMY Right    cancer  . PLACEMENT OF BREAST IMPLANTS Bilateral    "had to take tissue out of left"  . TONSILLECTOMY    . VAGINAL HYSTERECTOMY      Family History  Problem  Relation Age of Onset  . Stroke Mother   . Heart disease Father   . Alzheimer's disease Sister   . Leukemia Brother   . Arthritis Maternal Grandmother   . Breast cancer Daughter     Allergies  Allergen Reactions  . Codeine Anaphylaxis  . Sulfa Antibiotics Rash  . Darvon [Propoxyphene] Other (See Comments)  . Floxin [Ofloxacin] Other (See Comments)  . Levsin [Hyoscyamine Sulfate] Other (See Comments)  . Penicillins     Has patient had a PCN reaction causing immediate rash, facial/tongue/throat swelling, SOB or lightheadedness with hypotension: Yes Has patient had a PCN reaction causing severe  rash involving mucus membranes or skin necrosis: No Has patient had a PCN reaction that required hospitalization No Has patient had a PCN reaction occurring within the last 10 years: No If all of the above answers are "NO", then may proceed with Cephalosporin use.   . Valium [Diazepam] Other (See Comments)  . Zantac [Ranitidine Hcl] Other (See Comments)    Current Outpatient Prescriptions on File Prior to Visit  Medication Sig Dispense Refill  . atorvastatin (LIPITOR) 20 MG tablet Take 1 tablet (20 mg total) by mouth daily. 90 tablet 3  . digoxin (LANOXIN) 0.125 MG tablet Take 1 tablet (125 mcg total) by mouth daily. 90 tablet 3  . escitalopram (LEXAPRO) 10 MG tablet TAKE 1 TABLET(10 MG) BY MOUTH DAILY (Patient taking differently: Take 10 mg by mouth once a day) 90 tablet 0  . gabapentin (NEURONTIN) 300 MG capsule Take 1 capsule (300 mg total) by mouth 2 (two) times daily. 180 capsule 3  . mirabegron ER (MYRBETRIQ) 25 MG TB24 tablet Take 1 tablet (25 mg total) by mouth daily. 30 tablet 3  . QUEtiapine (SEROQUEL) 25 MG tablet Take 1 tablet (25 mg total) by mouth 2 (two) times daily. 180 tablet 1  . traMADol (ULTRAM) 50 MG tablet Take 1 tablet (50 mg total) by mouth every 6 (six) hours as needed. 90 tablet 2  . traZODone (DESYREL) 150 MG tablet TAKE 1 TABLET BY MOUTH EVERY NIGHT AT BEDTIME (Patient taking differently: Take 150 mg by mouth at bedtime) 90 tablet 2  . warfarin (COUMADIN) 5 MG tablet Take as directed by anticoagulation clinic. (Patient taking differently: Take 2.5-5 mg by mouth See admin instructions. 2.5 mg in the morning on Sun/Tues/Wed/Thurs/Fri/Sat and 5 mg on Mon) 30 tablet 3   No current facility-administered medications on file prior to visit.     BP (!) 142/88   Temp 98.6 F (37 C) (Oral)   Ht 5\' 6"  (1.676 m)   Wt 170 lb 3.2 oz (77.2 kg)   LMP 10/10/1969   BMI 27.47 kg/m       Objective:   Physical Exam  Constitutional: She is oriented to person, place, and  time. She appears well-developed and well-nourished. No distress.  HENT:  Head: Normocephalic and atraumatic.  Right Ear: External ear normal.  Left Ear: External ear normal.  Nose: Nose normal.  Mouth/Throat: Oropharynx is clear and moist. No oropharyngeal exudate.  Eyes: Conjunctivae are normal. Pupils are equal, round, and reactive to light. Right eye exhibits no discharge. Left eye exhibits no discharge. No scleral icterus. Right eye exhibits nystagmus (horizontal ). Left eye exhibits nystagmus (horizontal ).  Cardiovascular: Normal heart sounds and intact distal pulses.  An irregularly irregular rhythm present. Exam reveals no gallop and no friction rub.   No murmur heard. Pulmonary/Chest: Effort normal and breath sounds normal.  Abdominal: Soft. Bowel sounds  are normal. She exhibits no distension and no mass. There is no tenderness. There is no rebound and no guarding.  Genitourinary: Rectal exam shows guaiac negative stool.  Musculoskeletal: Normal range of motion. She exhibits no edema, tenderness or deformity.  Neurological: She is alert and oriented to person, place, and time.  Skin: Skin is warm and dry. No rash noted. She is not diaphoretic. No erythema. No pallor.  Psychiatric: She has a normal mood and affect. Her behavior is normal. Judgment and thought content normal.  Nursing note and vitals reviewed.     Assessment & Plan:  1. Dizziness - Possibly vertigo, A. fib, dehydration, or overmedication. I advised that they can try over-the-counter Dramamine for the next couple of days to see if that helps. - Do not take diltiazem - Increase water intake - CBC with Differential/Platelet - Basic metabolic panel - Follow-up with Dr. Raliegh Ip as needed 2. Dark stools - Hemoccult negative - Likely from iron supplement. She was advised to restart her oral iron - Follow-up with Dr. Heide Scales, NP

## 2016-01-12 NOTE — Patient Instructions (Signed)
It was great seeing you today. I am sorry you are feeling this way.   I will follow up with you regarding your blood work   The dark stools were from the iron supplement you were taking. Restart the iron supplement  Do not take the Diltiazem   You can use a normal saline eye drop to help with the dry eyes  Get some dramamine to see if that helps with the dizziness  Follow up with Dr. Raliegh Ip as directed

## 2016-01-13 LAB — BASIC METABOLIC PANEL
BUN: 21 mg/dL (ref 7–25)
CO2: 25 mmol/L (ref 20–31)
Calcium: 8.7 mg/dL (ref 8.6–10.4)
Chloride: 105 mmol/L (ref 98–110)
Creat: 1.34 mg/dL — ABNORMAL HIGH (ref 0.60–0.88)
Glucose, Bld: 99 mg/dL (ref 65–99)
Potassium: 4.2 mmol/L (ref 3.5–5.3)
Sodium: 140 mmol/L (ref 135–146)

## 2016-01-22 ENCOUNTER — Institutional Professional Consult (permissible substitution): Payer: Medicare Other | Admitting: Internal Medicine

## 2016-01-24 ENCOUNTER — Institutional Professional Consult (permissible substitution): Payer: Medicare Other | Admitting: Internal Medicine

## 2016-01-25 DIAGNOSIS — R531 Weakness: Secondary | ICD-10-CM | POA: Diagnosis not present

## 2016-01-25 DIAGNOSIS — R404 Transient alteration of awareness: Secondary | ICD-10-CM | POA: Diagnosis not present

## 2016-01-27 DIAGNOSIS — R42 Dizziness and giddiness: Secondary | ICD-10-CM | POA: Diagnosis not present

## 2016-01-27 DIAGNOSIS — R404 Transient alteration of awareness: Secondary | ICD-10-CM | POA: Diagnosis not present

## 2016-02-02 DIAGNOSIS — M5489 Other dorsalgia: Secondary | ICD-10-CM | POA: Diagnosis not present

## 2016-02-02 DIAGNOSIS — T148XXA Other injury of unspecified body region, initial encounter: Secondary | ICD-10-CM | POA: Diagnosis not present

## 2016-02-03 ENCOUNTER — Emergency Department (HOSPITAL_COMMUNITY)
Admission: EM | Admit: 2016-02-03 | Discharge: 2016-02-03 | Disposition: A | Discharge status: Home / Self Care | Payer: Medicare Other | Attending: Emergency Medicine | Admitting: Emergency Medicine

## 2016-02-03 ENCOUNTER — Encounter (HOSPITAL_COMMUNITY): Payer: Self-pay | Admitting: Emergency Medicine

## 2016-02-03 ENCOUNTER — Emergency Department (HOSPITAL_COMMUNITY): Payer: Medicare Other

## 2016-02-03 DIAGNOSIS — S199XXA Unspecified injury of neck, initial encounter: Secondary | ICD-10-CM | POA: Diagnosis not present

## 2016-02-03 DIAGNOSIS — R51 Headache: Secondary | ICD-10-CM | POA: Diagnosis not present

## 2016-02-03 DIAGNOSIS — Z043 Encounter for examination and observation following other accident: Secondary | ICD-10-CM | POA: Diagnosis not present

## 2016-02-03 DIAGNOSIS — Y939 Activity, unspecified: Secondary | ICD-10-CM | POA: Diagnosis not present

## 2016-02-03 DIAGNOSIS — R930 Abnormal findings on diagnostic imaging of skull and head, not elsewhere classified: Secondary | ICD-10-CM | POA: Diagnosis not present

## 2016-02-03 DIAGNOSIS — Z853 Personal history of malignant neoplasm of breast: Secondary | ICD-10-CM | POA: Diagnosis not present

## 2016-02-03 DIAGNOSIS — I10 Essential (primary) hypertension: Secondary | ICD-10-CM | POA: Diagnosis not present

## 2016-02-03 DIAGNOSIS — Y999 Unspecified external cause status: Secondary | ICD-10-CM | POA: Diagnosis not present

## 2016-02-03 DIAGNOSIS — Y92129 Unspecified place in nursing home as the place of occurrence of the external cause: Secondary | ICD-10-CM | POA: Diagnosis not present

## 2016-02-03 DIAGNOSIS — R791 Abnormal coagulation profile: Secondary | ICD-10-CM | POA: Insufficient documentation

## 2016-02-03 DIAGNOSIS — Z7901 Long term (current) use of anticoagulants: Secondary | ICD-10-CM | POA: Diagnosis not present

## 2016-02-03 DIAGNOSIS — Z79899 Other long term (current) drug therapy: Secondary | ICD-10-CM | POA: Insufficient documentation

## 2016-02-03 DIAGNOSIS — S0990XA Unspecified injury of head, initial encounter: Secondary | ICD-10-CM | POA: Diagnosis not present

## 2016-02-03 DIAGNOSIS — W06XXXA Fall from bed, initial encounter: Secondary | ICD-10-CM | POA: Insufficient documentation

## 2016-02-03 DIAGNOSIS — W19XXXA Unspecified fall, initial encounter: Secondary | ICD-10-CM | POA: Diagnosis not present

## 2016-02-03 LAB — PROTIME-INR
INR: 1.34
Prothrombin Time: 16.7 seconds — ABNORMAL HIGH (ref 11.4–15.2)

## 2016-02-03 NOTE — ED Provider Notes (Signed)
[facility] DEPT Provider Note   CSN: [id number] Arrival date & time: 02/03/16  0000     History   Chief Complaint Chief Complaint  Patient presents with  . Fall    unwitnesed     HPI Tracy Rodriguez is a 82 y.o. female.  She was transferred at a nursing care facility where she had an unwitnessed fall. Patient states that she fell getting out of bed. She denies any injury. Of note, she is anticoagulated on warfarin because of atrial fibrillation.   The history is provided by the patient. The history is limited by the condition of the patient (Dementia).  Fall     Past Medical History:  Diagnosis Date  . Arthritis    "joints" (08/04/2014)  . Bipolar 1 disorder ([place])   . Cancer of right breast ([place])   . Chronic lower back pain   . Hyperlipemia   . Hypertension   . PAF (paroxysmal atrial fibrillation) [facility])     Patient Active Problem List   Diagnosis Date Noted  . Syncope 12/05/2015  . Physical deconditioning 12/05/2015  . Acute kidney injury ([place]) 12/05/2015  . Dehydration, moderate 12/05/2015  . Acute hypokalemia 12/05/2015  . Abnormal chest x-ray 12/05/2015  . Encounter for therapeutic drug monitoring 12/22/2014  . Atrial fibrillation with RVR ([place]) 08/04/2014  . Chronic atrial fibrillation ([place]) 08/04/2014  . Essential hypertension   . Obesity 09/27/2013  . Chronic back pain 09/14/2012  . Bipolar disorder ([place]) 09/14/2012    Past Surgical History:  Procedure Laterality Date  . BACK SURGERY    . BREAST BIOPSY Right   . BREAST RECONSTRUCTION Right   . CATARACT EXTRACTION W/ INTRAOCULAR LENS  IMPLANT, BILATERAL Bilateral   . EXCISIONAL HEMORRHOIDECTOMY    . [place]; 1973; 1985   "ruptured discs each time"  . MASTECTOMY Right    cancer  . PLACEMENT OF BREAST IMPLANTS Bilateral    "had to take tissue out of left"  . TONSILLECTOMY    . VAGINAL HYSTERECTOMY      OB History    No data available       Home Medications     Prior to Admission medications   Medication Sig Start Date End Date Taking? Authorizing Provider  atorvastatin (LIPITOR) 20 MG tablet Take 1 tablet (20 mg total) by mouth daily. 05/15/15   Marletta Lor, MD  digoxin (LANOXIN) 0.125 MG tablet Take 1 tablet (125 mcg total) by mouth daily. 01/02/16   Marletta Lor, MD  escitalopram (LEXAPRO) 10 MG tablet TAKE 1 TABLET(10 MG) BY MOUTH DAILY Patient taking differently: Take 10 mg by mouth once a day 11/07/15   Marletta Lor, MD  gabapentin (NEURONTIN) 300 MG capsule Take 1 capsule (300 mg total) by mouth 2 (two) times daily. 05/15/15   Marletta Lor, MD  mirabegron ER (MYRBETRIQ) 25 MG TB24 tablet Take 1 tablet (25 mg total) by mouth daily. 10/11/15   Marletta Lor, MD  QUEtiapine (SEROQUEL) 25 MG tablet Take 1 tablet (25 mg total) by mouth 2 (two) times daily. 08/18/15   Marletta Lor, MD  traMADol (ULTRAM) 50 MG tablet Take 1 tablet (50 mg total) by mouth every 6 (six) hours as needed. 01/02/16   Marletta Lor, MD  traZODone (DESYREL) 150 MG tablet TAKE 1 TABLET BY MOUTH EVERY NIGHT AT BEDTIME Patient taking differently: Take 150 mg by mouth at bedtime 08/15/15   Marletta Lor, MD  warfarin (COUMADIN)  5 MG tablet Take as directed by anticoagulation clinic. Patient taking differently: Take 2.5-5 mg by mouth See admin instructions. 2.5 mg in the morning on Sun/Tues/Wed/Thurs/Fri/Sat and 5 mg on Mon 11/15/15   Marletta Lor, MD    Family History Family History  Problem Relation Age of Onset  . Stroke Mother   . Heart disease Father   . Alzheimer's disease Sister   . Leukemia Brother   . Arthritis Maternal Grandmother   . Breast cancer Daughter     Social History Social History  Substance Use Topics  . Smoking status: Never Smoker  . Smokeless tobacco: Never Used  . Alcohol use No     Allergies   Codeine; Sulfa antibiotics; Darvon [propoxyphene]; Floxin [ofloxacin]; Levsin [hyoscyamine  sulfate]; Penicillins; Valium [diazepam]; and Zantac [ranitidine hcl]   Review of Systems Review of Systems  Unable to perform ROS: Dementia     Physical Exam Updated Vital Signs BP 192/71 (BP Location: Left Arm)   Pulse 68   Temp 98.5 F (36.9 C) (Oral)   Resp 17   LMP 10/10/1969   SpO2 95%   Physical Exam  Nursing note and vitals reviewed.  82 year old female, resting comfortably and in no acute distress. Vital signs are significant for hypertension. Oxygen saturation is 95%, which is normal. Head is normocephalic and atraumatic. PERRLA, EOMI. Oropharynx is clear. Neck is nontender without adenopathy or JVD. Back is nontender and there is no CVA tenderness. Lungs are clear without rales, wheezes, or rhonchi. Chest is nontender. Heart has regular rate and rhythm without murmur. Abdomen is soft, flat, nontender without masses or hepatosplenomegaly and peristalsis is normoactive. Extremities have no cyanosis or edema, full range of motion is present. Skin is warm and dry without rash. Neurologic: She is awake and alert and oriented to person but not place or time, cranial nerves are intact, there are no motor or sensory deficits.  ED Treatments / Results  Labs (all labs ordered are listed, but only abnormal results are displayed) Labs Reviewed  PROTIME-INR - Abnormal; Notable for the following:       Result Value   Prothrombin Time 16.7 (*)    All other components within normal limits     Radiology Ct Head Wo Contrast  Result Date: 02/03/2016 CLINICAL DATA:  Status post fall, with concern for head or cervical spine injury. Initial encounter. EXAM: CT HEAD WITHOUT CONTRAST CT CERVICAL SPINE WITHOUT CONTRAST TECHNIQUE: Multidetector CT imaging of the head and cervical spine was performed following the standard protocol without intravenous contrast. Multiplanar CT image reconstructions of the cervical spine were also generated. COMPARISON:  CT of the head performed  12/05/2015 FINDINGS: CT HEAD FINDINGS Brain: No evidence of acute infarction, hemorrhage, hydrocephalus, extra-axial collection or mass lesion/mass effect. Prominence of the ventricles and sulci reflects mild cortical volume loss. Mild periventricular white matter change likely reflects small vessel ischemic microangiopathy. Mild cerebellar atrophy is noted. The brainstem and fourth ventricle are within normal limits. The basal ganglia are unremarkable in appearance. The cerebral hemispheres demonstrate grossly normal gray-white differentiation. No mass effect or midline shift is seen. Vascular: No hyperdense vessel or unexpected calcification. Skull: There is no evidence of fracture; visualized osseous structures are unremarkable in appearance. Sinuses/Orbits: The orbits are within normal limits. The paranasal sinuses and mastoid air cells are well-aerated. Other: No significant soft tissue abnormalities are seen. CT CERVICAL SPINE FINDINGS Alignment: Normal. Skull base and vertebrae: No acute fracture. No primary bone lesion or focal  pathologic process. Soft tissues and spinal canal: No prevertebral fluid or swelling. No visible canal hematoma. Disc levels: Intervertebral disc space narrowing is noted at C6-C7, with anterior and posterior disc osteophyte complexes. Mild facet disease is noted at the mid cervical spine. Upper chest: Scarring is noted at the lung apices. The visualized portions of the thyroid gland are unremarkable. Other: No additional soft tissue abnormalities are seen. IMPRESSION: 1. No evidence of traumatic intracranial injury or fracture. 2. No evidence of fracture or subluxation along the cervical spine. 3. Mild cortical volume loss and scattered small vessel ischemic microangiopathy. 4. Mild degenerative change along the mid to lower cervical spine. 5. Scarring at the lung apices. Electronically Signed   By: Garald Balding M.D.   On: 02/03/2016 02:38   Ct Cervical Spine Wo  Contrast  Result Date: 02/03/2016 CLINICAL DATA:  Status post fall, with concern for head or cervical spine injury. Initial encounter. EXAM: CT HEAD WITHOUT CONTRAST CT CERVICAL SPINE WITHOUT CONTRAST TECHNIQUE: Multidetector CT imaging of the head and cervical spine was performed following the standard protocol without intravenous contrast. Multiplanar CT image reconstructions of the cervical spine were also generated. COMPARISON:  CT of the head performed 12/05/2015 FINDINGS: CT HEAD FINDINGS Brain: No evidence of acute infarction, hemorrhage, hydrocephalus, extra-axial collection or mass lesion/mass effect. Prominence of the ventricles and sulci reflects mild cortical volume loss. Mild periventricular white matter change likely reflects small vessel ischemic microangiopathy. Mild cerebellar atrophy is noted. The brainstem and fourth ventricle are within normal limits. The basal ganglia are unremarkable in appearance. The cerebral hemispheres demonstrate grossly normal gray-white differentiation. No mass effect or midline shift is seen. Vascular: No hyperdense vessel or unexpected calcification. Skull: There is no evidence of fracture; visualized osseous structures are unremarkable in appearance. Sinuses/Orbits: The orbits are within normal limits. The paranasal sinuses and mastoid air cells are well-aerated. Other: No significant soft tissue abnormalities are seen. CT CERVICAL SPINE FINDINGS Alignment: Normal. Skull base and vertebrae: No acute fracture. No primary bone lesion or focal pathologic process. Soft tissues and spinal canal: No prevertebral fluid or swelling. No visible canal hematoma. Disc levels: Intervertebral disc space narrowing is noted at C6-C7, with anterior and posterior disc osteophyte complexes. Mild facet disease is noted at the mid cervical spine. Upper chest: Scarring is noted at the lung apices. The visualized portions of the thyroid gland are unremarkable. Other: No additional soft  tissue abnormalities are seen. IMPRESSION: 1. No evidence of traumatic intracranial injury or fracture. 2. No evidence of fracture or subluxation along the cervical spine. 3. Mild cortical volume loss and scattered small vessel ischemic microangiopathy. 4. Mild degenerative change along the mid to lower cervical spine. 5. Scarring at the lung apices. Electronically Signed   By: Garald Balding M.D.   On: 02/03/2016 02:38    Procedures Procedures (including critical care time)  Medications Ordered in ED Medications - No data to display   Initial Impression / Assessment and Plan / ED Course  I have reviewed the triage vital signs and the nursing notes.  Pertinent labs & imaging results that were available during my care of the patient were reviewed by me and considered in my medical decision making (see chart for details).  Clinical Course    Fall without apparent injury. She will be sent for CT of head and cervical spine. INR will be checked. Old records are reviewed, and there are no relevant past visits. CHADS-VASC score = 4.  CT scans are unremarkable.  INR is subtherapeutic. She is sent back to her nursing home. She will need to contact the Coumadin clinic for recommendations on adjusting her warfarin dose.  Final Clinical Impressions(s) / ED Diagnoses   Final diagnoses:  Fall at nursing home, initial encounter  Subtherapeutic international normalized ratio (INR)    New Prescriptions New Prescriptions   No medications on file     Delora Fuel, MD [id number]

## 2016-02-03 NOTE — ED Notes (Addendum)
Patient transported to CT 

## 2016-02-03 NOTE — ED Notes (Signed)
Bed: [id number] Expected date:  Expected time:  Means of arrival:  Comments: 82yo F/Fall

## 2016-02-03 NOTE — ED Notes (Signed)
[employer] scientist for [employer]

## 2016-02-03 NOTE — Discharge Instructions (Signed)
Call the Coumadin Clinic for recommendation on how to adjust your warfarin dose. INR today is 1.34, and it is supposed to be between 2.0 and 3.0.

## 2016-02-03 NOTE — ED Triage Notes (Signed)
Pt comes to Ed, via ems, unwitnessed fall tonight, time 10;20pm, facility [facility], intermitten dementia. Hx of Afib, Htn,   No visibleable injuries, ( small bruise right upper arm)  Pt has a hx of being on coumadin.  Alert x3 , allergies to penicillin, sulfa, codeine. Full code, grand son otw.  V/s on arrival 160/90, rate 70 hr irreg ( afib)  97 room air, cbg 116.   New bp 212/100 at 23:26 -

## 2016-02-05 ENCOUNTER — Ambulatory Visit: Payer: Self-pay | Admitting: General Practice

## 2016-02-05 DIAGNOSIS — I4891 Unspecified atrial fibrillation: Secondary | ICD-10-CM

## 2016-02-05 DIAGNOSIS — Z5181 Encounter for therapeutic drug level monitoring: Secondary | ICD-10-CM

## 2016-02-06 ENCOUNTER — Other Ambulatory Visit: Payer: Self-pay | Admitting: Internal Medicine

## 2016-02-07 ENCOUNTER — Emergency Department (HOSPITAL_COMMUNITY): Payer: Medicare Other

## 2016-02-07 ENCOUNTER — Inpatient Hospital Stay (HOSPITAL_COMMUNITY): Payer: Medicare Other

## 2016-02-07 ENCOUNTER — Inpatient Hospital Stay (HOSPITAL_COMMUNITY)
Admission: EM | Admit: 2016-02-07 | Discharge: 2016-02-12 | DRG: 305 | Disposition: A | Discharge status: Skilled Nursing Facility | Payer: Medicare Other | Attending: Internal Medicine | Admitting: Internal Medicine

## 2016-02-07 ENCOUNTER — Encounter (HOSPITAL_COMMUNITY): Payer: Self-pay | Admitting: *Deleted

## 2016-02-07 DIAGNOSIS — I129 Hypertensive chronic kidney disease with stage 1 through stage 4 chronic kidney disease, or unspecified chronic kidney disease: Secondary | ICD-10-CM | POA: Diagnosis present

## 2016-02-07 DIAGNOSIS — Z79899 Other long term (current) drug therapy: Secondary | ICD-10-CM

## 2016-02-07 DIAGNOSIS — G20 Parkinson's disease: Secondary | ICD-10-CM | POA: Diagnosis present

## 2016-02-07 DIAGNOSIS — Z888 Allergy status to other drugs, medicaments and biological substances status: Secondary | ICD-10-CM

## 2016-02-07 DIAGNOSIS — R262 Difficulty in walking, not elsewhere classified: Secondary | ICD-10-CM | POA: Diagnosis present

## 2016-02-07 DIAGNOSIS — G9340 Encephalopathy, unspecified: Secondary | ICD-10-CM | POA: Diagnosis not present

## 2016-02-07 DIAGNOSIS — Z9011 Acquired absence of right breast and nipple: Secondary | ICD-10-CM

## 2016-02-07 DIAGNOSIS — E876 Hypokalemia: Secondary | ICD-10-CM | POA: Diagnosis present

## 2016-02-07 DIAGNOSIS — Z8249 Family history of ischemic heart disease and other diseases of the circulatory system: Secondary | ICD-10-CM

## 2016-02-07 DIAGNOSIS — R4781 Slurred speech: Secondary | ICD-10-CM | POA: Diagnosis not present

## 2016-02-07 DIAGNOSIS — Z853 Personal history of malignant neoplasm of breast: Secondary | ICD-10-CM

## 2016-02-07 DIAGNOSIS — W19XXXA Unspecified fall, initial encounter: Secondary | ICD-10-CM | POA: Diagnosis present

## 2016-02-07 DIAGNOSIS — E569 Vitamin deficiency, unspecified: Secondary | ICD-10-CM | POA: Diagnosis not present

## 2016-02-07 DIAGNOSIS — G8929 Other chronic pain: Secondary | ICD-10-CM | POA: Diagnosis present

## 2016-02-07 DIAGNOSIS — F0280 Dementia in other diseases classified elsewhere without behavioral disturbance: Secondary | ICD-10-CM | POA: Diagnosis present

## 2016-02-07 DIAGNOSIS — F319 Bipolar disorder, unspecified: Secondary | ICD-10-CM | POA: Diagnosis present

## 2016-02-07 DIAGNOSIS — R41841 Cognitive communication deficit: Secondary | ICD-10-CM | POA: Diagnosis not present

## 2016-02-07 DIAGNOSIS — R4182 Altered mental status, unspecified: Secondary | ICD-10-CM | POA: Diagnosis not present

## 2016-02-07 DIAGNOSIS — I480 Paroxysmal atrial fibrillation: Secondary | ICD-10-CM | POA: Diagnosis present

## 2016-02-07 DIAGNOSIS — Z881 Allergy status to other antibiotic agents status: Secondary | ICD-10-CM | POA: Diagnosis not present

## 2016-02-07 DIAGNOSIS — R778 Other specified abnormalities of plasma proteins: Secondary | ICD-10-CM | POA: Diagnosis present

## 2016-02-07 DIAGNOSIS — S299XXA Unspecified injury of thorax, initial encounter: Secondary | ICD-10-CM | POA: Diagnosis not present

## 2016-02-07 DIAGNOSIS — I482 Chronic atrial fibrillation: Secondary | ICD-10-CM | POA: Diagnosis present

## 2016-02-07 DIAGNOSIS — M545 Low back pain: Secondary | ICD-10-CM | POA: Diagnosis present

## 2016-02-07 DIAGNOSIS — M25561 Pain in right knee: Secondary | ICD-10-CM | POA: Diagnosis present

## 2016-02-07 DIAGNOSIS — N183 Chronic kidney disease, stage 3 unspecified: Secondary | ICD-10-CM | POA: Diagnosis present

## 2016-02-07 DIAGNOSIS — Z66 Do not resuscitate: Secondary | ICD-10-CM | POA: Diagnosis present

## 2016-02-07 DIAGNOSIS — R251 Tremor, unspecified: Secondary | ICD-10-CM | POA: Diagnosis not present

## 2016-02-07 DIAGNOSIS — R1310 Dysphagia, unspecified: Secondary | ICD-10-CM | POA: Diagnosis present

## 2016-02-07 DIAGNOSIS — R7989 Other specified abnormal findings of blood chemistry: Secondary | ICD-10-CM

## 2016-02-07 DIAGNOSIS — M6281 Muscle weakness (generalized): Secondary | ICD-10-CM | POA: Diagnosis not present

## 2016-02-07 DIAGNOSIS — G4700 Insomnia, unspecified: Secondary | ICD-10-CM | POA: Diagnosis not present

## 2016-02-07 DIAGNOSIS — Z806 Family history of leukemia: Secondary | ICD-10-CM

## 2016-02-07 DIAGNOSIS — S79911A Unspecified injury of right hip, initial encounter: Secondary | ICD-10-CM | POA: Diagnosis not present

## 2016-02-07 DIAGNOSIS — Z9181 History of falling: Secondary | ICD-10-CM | POA: Diagnosis not present

## 2016-02-07 DIAGNOSIS — I160 Hypertensive urgency: Principal | ICD-10-CM | POA: Diagnosis present

## 2016-02-07 DIAGNOSIS — Z79891 Long term (current) use of opiate analgesic: Secondary | ICD-10-CM

## 2016-02-07 DIAGNOSIS — Z9882 Breast implant status: Secondary | ICD-10-CM

## 2016-02-07 DIAGNOSIS — R338 Other retention of urine: Secondary | ICD-10-CM | POA: Diagnosis not present

## 2016-02-07 DIAGNOSIS — F05 Delirium due to known physiological condition: Secondary | ICD-10-CM | POA: Diagnosis present

## 2016-02-07 DIAGNOSIS — E785 Hyperlipidemia, unspecified: Secondary | ICD-10-CM | POA: Diagnosis present

## 2016-02-07 DIAGNOSIS — I674 Hypertensive encephalopathy: Secondary | ICD-10-CM | POA: Diagnosis present

## 2016-02-07 DIAGNOSIS — M25552 Pain in left hip: Secondary | ICD-10-CM | POA: Diagnosis not present

## 2016-02-07 DIAGNOSIS — R410 Disorientation, unspecified: Secondary | ICD-10-CM | POA: Insufficient documentation

## 2016-02-07 DIAGNOSIS — I9589 Other hypotension: Secondary | ICD-10-CM | POA: Diagnosis not present

## 2016-02-07 DIAGNOSIS — R748 Abnormal levels of other serum enzymes: Secondary | ICD-10-CM | POA: Diagnosis not present

## 2016-02-07 DIAGNOSIS — R278 Other lack of coordination: Secondary | ICD-10-CM | POA: Diagnosis not present

## 2016-02-07 DIAGNOSIS — M25551 Pain in right hip: Secondary | ICD-10-CM | POA: Diagnosis not present

## 2016-02-07 DIAGNOSIS — R011 Cardiac murmur, unspecified: Secondary | ICD-10-CM | POA: Diagnosis present

## 2016-02-07 DIAGNOSIS — I4891 Unspecified atrial fibrillation: Secondary | ICD-10-CM | POA: Diagnosis not present

## 2016-02-07 DIAGNOSIS — M1990 Unspecified osteoarthritis, unspecified site: Secondary | ICD-10-CM | POA: Diagnosis present

## 2016-02-07 DIAGNOSIS — Z885 Allergy status to narcotic agent status: Secondary | ICD-10-CM

## 2016-02-07 DIAGNOSIS — Z9841 Cataract extraction status, right eye: Secondary | ICD-10-CM

## 2016-02-07 DIAGNOSIS — I248 Other forms of acute ischemic heart disease: Secondary | ICD-10-CM | POA: Diagnosis present

## 2016-02-07 DIAGNOSIS — I10 Essential (primary) hypertension: Secondary | ICD-10-CM | POA: Diagnosis not present

## 2016-02-07 DIAGNOSIS — S79912A Unspecified injury of left hip, initial encounter: Secondary | ICD-10-CM | POA: Diagnosis not present

## 2016-02-07 DIAGNOSIS — Z961 Presence of intraocular lens: Secondary | ICD-10-CM | POA: Diagnosis present

## 2016-02-07 DIAGNOSIS — N179 Acute kidney failure, unspecified: Secondary | ICD-10-CM | POA: Diagnosis present

## 2016-02-07 DIAGNOSIS — G301 Alzheimer's disease with late onset: Secondary | ICD-10-CM | POA: Diagnosis not present

## 2016-02-07 DIAGNOSIS — S0990XA Unspecified injury of head, initial encounter: Secondary | ICD-10-CM | POA: Diagnosis not present

## 2016-02-07 DIAGNOSIS — S8991XA Unspecified injury of right lower leg, initial encounter: Secondary | ICD-10-CM

## 2016-02-07 DIAGNOSIS — S8991XD Unspecified injury of right lower leg, subsequent encounter: Secondary | ICD-10-CM | POA: Diagnosis not present

## 2016-02-07 DIAGNOSIS — Z820 Family history of epilepsy and other diseases of the nervous system: Secondary | ICD-10-CM

## 2016-02-07 DIAGNOSIS — Z803 Family history of malignant neoplasm of breast: Secondary | ICD-10-CM

## 2016-02-07 DIAGNOSIS — Z882 Allergy status to sulfonamides status: Secondary | ICD-10-CM

## 2016-02-07 DIAGNOSIS — I6789 Other cerebrovascular disease: Secondary | ICD-10-CM | POA: Diagnosis not present

## 2016-02-07 DIAGNOSIS — Z880 Allergy status to penicillin: Secondary | ICD-10-CM

## 2016-02-07 DIAGNOSIS — Z9842 Cataract extraction status, left eye: Secondary | ICD-10-CM

## 2016-02-07 DIAGNOSIS — Z823 Family history of stroke: Secondary | ICD-10-CM

## 2016-02-07 DIAGNOSIS — M549 Dorsalgia, unspecified: Secondary | ICD-10-CM | POA: Diagnosis not present

## 2016-02-07 DIAGNOSIS — E784 Other hyperlipidemia: Secondary | ICD-10-CM | POA: Diagnosis not present

## 2016-02-07 LAB — COMPREHENSIVE METABOLIC PANEL
ALT: 17 U/L (ref 14–54)
AST: 25 U/L (ref 15–41)
Albumin: 3.7 g/dL (ref 3.5–5.0)
Alkaline Phosphatase: 59 U/L (ref 38–126)
Anion gap: 8 (ref 5–15)
BUN: 11 mg/dL (ref 6–20)
CO2: 27 mmol/L (ref 22–32)
Calcium: 9.1 mg/dL (ref 8.9–10.3)
Chloride: 106 mmol/L (ref 101–111)
Creatinine, Ser: 1.21 mg/dL — ABNORMAL HIGH (ref 0.44–1.00)
GFR calc Af Amer: 47 mL/min — ABNORMAL LOW (ref >60)
GFR calc non Af Amer: 41 mL/min — ABNORMAL LOW (ref >60)
Glucose, Bld: 109 mg/dL — ABNORMAL HIGH (ref 65–99)
Potassium: 3.7 mmol/L (ref 3.5–5.1)
Sodium: 141 mmol/L (ref 135–145)
Total Bilirubin: 0.5 mg/dL (ref 0.3–1.2)
Total Protein: 6.8 g/dL (ref 6.5–8.1)

## 2016-02-07 LAB — CBC WITH DIFFERENTIAL/PLATELET
Basophils Absolute: 0 10*3/uL (ref 0.0–0.1)
Basophils Relative: 0 %
Eosinophils Absolute: 0.2 10*3/uL (ref 0.0–0.7)
Eosinophils Relative: 2 %
HCT: 37.3 % (ref 36.0–46.0)
Hemoglobin: 11.7 g/dL — ABNORMAL LOW (ref 12.0–15.0)
Lymphocytes Relative: 14 %
Lymphs Abs: 1.1 10*3/uL (ref 0.7–4.0)
MCH: 26.3 pg (ref 26.0–34.0)
MCHC: 31.4 g/dL (ref 30.0–36.0)
MCV: 83.8 fL (ref 78.0–100.0)
Monocytes Absolute: 0.8 10*3/uL (ref 0.1–1.0)
Monocytes Relative: 11 %
Neutro Abs: 5.5 10*3/uL (ref 1.7–7.7)
Neutrophils Relative %: 73 %
Platelets: 214 10*3/uL (ref 150–400)
RBC: 4.45 MIL/uL (ref 3.87–5.11)
RDW: 20.8 % — ABNORMAL HIGH (ref 11.5–15.5)
WBC: 7.5 10*3/uL (ref 4.0–10.5)

## 2016-02-07 LAB — TROPONIN I: Troponin I: 0.03 ng/mL (ref <0.03)

## 2016-02-07 LAB — RAPID URINE DRUG SCREEN, HOSP PERFORMED
Amphetamines: NOT DETECTED
Barbiturates: NOT DETECTED
Benzodiazepines: NOT DETECTED
Cocaine: NOT DETECTED
Opiates: NOT DETECTED
Tetrahydrocannabinol: NOT DETECTED

## 2016-02-07 LAB — URINALYSIS, ROUTINE W REFLEX MICROSCOPIC
Bilirubin Urine: NEGATIVE
Glucose, UA: NEGATIVE mg/dL
Hgb urine dipstick: NEGATIVE
Ketones, ur: NEGATIVE mg/dL
Leukocytes, UA: NEGATIVE
Nitrite: NEGATIVE
Protein, ur: NEGATIVE mg/dL
Specific Gravity, Urine: 1.014 (ref 1.005–1.030)
pH: 6.5 (ref 5.0–8.0)

## 2016-02-07 LAB — DIGOXIN LEVEL: Digoxin Level: 1.7 ng/mL (ref 0.8–2.0)

## 2016-02-07 LAB — ETHANOL: Alcohol, Ethyl (B): <5 mg/dL (ref <5)

## 2016-02-07 LAB — APTT: aPTT: 26 seconds (ref 24–36)

## 2016-02-07 LAB — PROTIME-INR
INR: 1.19
Prothrombin Time: 15.1 seconds (ref 11.4–15.2)

## 2016-02-07 MED ORDER — LABETALOL HCL 5 MG/ML IV SOLN
10.0000 mg | Freq: Once | INTRAVENOUS | Status: AC
  Administered 2016-02-07: 10 mg via INTRAVENOUS
  Filled 2016-02-07: qty 4

## 2016-02-07 MED ORDER — QUETIAPINE FUMARATE 25 MG PO TABS
25.0000 mg | ORAL_TABLET | Freq: Two times a day (BID) | ORAL | Status: DC
  Administered 2016-02-08 – 2016-02-12 (×9): 25 mg via ORAL
  Filled 2016-02-07 (×10): qty 1

## 2016-02-07 MED ORDER — SODIUM CHLORIDE 0.9% FLUSH
3.0000 mL | Freq: Two times a day (BID) | INTRAVENOUS | Status: DC
  Administered 2016-02-07 – 2016-02-09 (×4): 3 mL via INTRAVENOUS

## 2016-02-07 MED ORDER — MIRABEGRON ER 25 MG PO TB24
25.0000 mg | ORAL_TABLET | Freq: Every day | ORAL | Status: DC
  Administered 2016-02-09 – 2016-02-12 (×4): 25 mg via ORAL
  Filled 2016-02-07 (×7): qty 1

## 2016-02-07 MED ORDER — TRAMADOL HCL 50 MG PO TABS
50.0000 mg | ORAL_TABLET | Freq: Four times a day (QID) | ORAL | Status: DC | PRN
  Administered 2016-02-08 – 2016-02-09 (×2): 50 mg via ORAL
  Filled 2016-02-07 (×2): qty 1

## 2016-02-07 MED ORDER — NITROGLYCERIN 0.4 MG SL SUBL: 0.4000 mg | SUBLINGUAL_TABLET | SUBLINGUAL | Status: DC | PRN

## 2016-02-07 MED ORDER — ENOXAPARIN SODIUM 40 MG/0.4ML ~~LOC~~ SOLN
40.0000 mg | Freq: Every day | SUBCUTANEOUS | Status: DC
  Administered 2016-02-08 – 2016-02-12 (×5): 40 mg via SUBCUTANEOUS
  Filled 2016-02-07 (×6): qty 0.4

## 2016-02-07 MED ORDER — HYDRALAZINE HCL 20 MG/ML IJ SOLN
5.0000 mg | INTRAMUSCULAR | Status: DC | PRN
  Filled 2016-02-07: qty 1

## 2016-02-07 MED ORDER — DILTIAZEM HCL ER COATED BEADS 120 MG PO CP24
120.0000 mg | ORAL_CAPSULE | Freq: Every day | ORAL | Status: DC
  Administered 2016-02-08 – 2016-02-12 (×5): 120 mg via ORAL
  Filled 2016-02-07 (×5): qty 1

## 2016-02-07 MED ORDER — ACETAMINOPHEN 325 MG PO TABS
650.0000 mg | ORAL_TABLET | Freq: Four times a day (QID) | ORAL | Status: DC | PRN
  Administered 2016-02-12: 650 mg via ORAL
  Filled 2016-02-07: qty 2

## 2016-02-07 MED ORDER — ATORVASTATIN CALCIUM 40 MG PO TABS
40.0000 mg | ORAL_TABLET | Freq: Every day | ORAL | Status: DC
  Administered 2016-02-08 – 2016-02-11 (×3): 40 mg via ORAL
  Filled 2016-02-07 (×4): qty 1

## 2016-02-07 MED ORDER — SODIUM CHLORIDE 0.9 % IV SOLN: 100.0000 mL/h | INTRAVENOUS | Status: DC

## 2016-02-07 MED ORDER — CARVEDILOL 3.125 MG PO TABS
3.1250 mg | ORAL_TABLET | Freq: Two times a day (BID) | ORAL | Status: DC
  Administered 2016-02-08 – 2016-02-09 (×3): 3.125 mg via ORAL
  Filled 2016-02-07 (×7): qty 1

## 2016-02-07 MED ORDER — ESCITALOPRAM OXALATE 10 MG PO TABS
10.0000 mg | ORAL_TABLET | Freq: Every day | ORAL | Status: DC
  Administered 2016-02-08 – 2016-02-12 (×5): 10 mg via ORAL
  Filled 2016-02-07 (×6): qty 1

## 2016-02-07 MED ORDER — ACETAMINOPHEN 650 MG RE SUPP
650.0000 mg | Freq: Four times a day (QID) | RECTAL | Status: DC | PRN
  Filled 2016-02-07: qty 1

## 2016-02-07 MED ORDER — ASPIRIN 325 MG PO TABS: 325.0000 mg | ORAL_TABLET | Freq: Every day | ORAL | Status: DC

## 2016-02-07 MED ORDER — SODIUM CHLORIDE 0.9 % IV BOLUS (SEPSIS)
500.0000 mL | Freq: Once | INTRAVENOUS | Status: AC
  Administered 2016-02-07: 500 mL via INTRAVENOUS

## 2016-02-07 MED ORDER — TRAZODONE HCL 50 MG PO TABS
150.0000 mg | ORAL_TABLET | Freq: Every day | ORAL | Status: DC
  Administered 2016-02-08 – 2016-02-11 (×4): 150 mg via ORAL
  Filled 2016-02-07 (×4): qty 1

## 2016-02-07 MED ORDER — HYDRALAZINE HCL 20 MG/ML IJ SOLN
10.0000 mg | INTRAMUSCULAR | Status: AC
  Administered 2016-02-07: 10 mg via INTRAVENOUS
  Filled 2016-02-07: qty 1

## 2016-02-07 MED ORDER — DILTIAZEM HCL ER 120 MG PO CP24: 120.0000 mg | ORAL_CAPSULE | Freq: Every day | ORAL | Status: DC

## 2016-02-07 MED ORDER — DIGOXIN 125 MCG PO TABS
125.0000 ug | ORAL_TABLET | Freq: Every day | ORAL | Status: DC
  Administered 2016-02-08 – 2016-02-12 (×5): 125 ug via ORAL
  Filled 2016-02-07 (×6): qty 1

## 2016-02-07 MED ORDER — GABAPENTIN 300 MG PO CAPS
300.0000 mg | ORAL_CAPSULE | Freq: Two times a day (BID) | ORAL | Status: DC
  Administered 2016-02-08 – 2016-02-12 (×9): 300 mg via ORAL
  Filled 2016-02-07 (×10): qty 1

## 2016-02-07 MED ORDER — SODIUM CHLORIDE 0.9 % IV SOLN
INTRAVENOUS | Status: DC
  Administered 2016-02-07 – 2016-02-08 (×2): via INTRAVENOUS

## 2016-02-07 NOTE — ED Notes (Signed)
The daughter at bedside reports that she has been declikning for the past 6 months  She lives independently at [facility] she falls at night  She was just as [place] 5 days ago for a fall.  The daughter reports  That she is worse for 2 days and could not stand at lunch  However ems reports that she was able to stand with them

## 2016-02-07 NOTE — ED Notes (Signed)
Pt available now for iv fluid start

## 2016-02-07 NOTE — ED Notes (Signed)
She failed her swallow screen unable to follow instructions

## 2016-02-07 NOTE — H&P (Signed)
History and Physical    Tracy Rodriguez [id number] DOB: Oct 09, 1931 DOA: 02/07/2016  Referring MD/NP/PA:   PCP: [name], MD   Patient coming from:  The patient is coming from home.  At baseline, pt is independent for most of ADL.   Chief Complaint: Fall, worsening mental status  HPI: Tracy Rodriguez is a 82 y.o. female with medical history significant of hypertension, hyperlipidemia, depression, PAF and not on anticoagulants, breast cancer (s/p of right mastectomy, implant), bipolar disorder, chronic back pain, CKD-3, possible dementia, who presents with fall and worsening mental status.  Per patient's daughter, patient may have dementia, but never diagnosed with dementia. Her mental status has been declining in the past 6 weeks, and much worse in the past several days. Patient become more confused, less interactive, weaker, not willing to stand and has poor appetite. Pt has poor balance and frequent fall recently, complaining of right knee injury and right hip pain. For her daughter, patient does not seem to have chest pain, shortness breath, cough, nausea, vomiting, abdominal pain, diarrhea. No unilateral weakness, vision change or hearing loss. Pt has intermittent right hand tremor.   ED Course: pt was found to have elevated blood pressure 212/77, WBC 7.5, positive troponin 0.03, INR 1.19, negative UDS, negative urinalysis, worsening renal function, temperature normal. Negative CT head for acute intracranial abnormalities, negative x-ray of bilateral hips/pelvis for bony fracture. Patient is admitted to telemetry bed as inpatient.  Review of Systems: Could not be reviewed accurately due to worsening mental status and possible dementia   Allergy:  Allergies  Allergen Reactions  . Codeine Anaphylaxis  . Sulfa Antibiotics Rash  . Darvon [Propoxyphene] Other (See Comments)  . Floxin [Ofloxacin] Other (See Comments)  . Levsin [Hyoscyamine Sulfate] Other (See Comments)  .  Penicillins     Has patient had a PCN reaction causing immediate rash, facial/tongue/throat swelling, SOB or lightheadedness with hypotension: Yes Has patient had a PCN reaction causing severe rash involving mucus membranes or skin necrosis: No Has patient had a PCN reaction that required hospitalization No Has patient had a PCN reaction occurring within the last 10 years: No If all of the above answers are "NO", then may proceed with Cephalosporin use.   . Valium [Diazepam] Other (See Comments)  . Zantac [Ranitidine Hcl] Other (See Comments)    Past Medical History:  Diagnosis Date  . Arthritis    "joints" (08/04/2014)  . Bipolar 1 disorder ([place])   . Cancer of right breast ([place])   . Chronic lower back pain   . Hyperlipemia   . Hypertension   . PAF (paroxysmal atrial fibrillation) ([place])     Past Surgical History:  Procedure Laterality Date  . BACK SURGERY    . BREAST BIOPSY Right   . BREAST RECONSTRUCTION Right   . CATARACT EXTRACTION W/ INTRAOCULAR LENS  IMPLANT, BILATERAL Bilateral   . EXCISIONAL HEMORRHOIDECTOMY    . [place]; 1973; 1985   "ruptured discs each time"  . MASTECTOMY Right    cancer  . PLACEMENT OF BREAST IMPLANTS Bilateral    "had to take tissue out of left"  . TONSILLECTOMY    . VAGINAL HYSTERECTOMY      Social History:  reports that she has never smoked. She has never used smokeless tobacco. She reports that she does not drink alcohol or use drugs.  Family History:  Family History  Problem Relation Age of Onset  . Stroke Mother   . Heart disease Father   .  Alzheimer's disease Sister   . Leukemia Brother   . Arthritis Maternal Grandmother   . Breast cancer Daughter      Prior to Admission medications   Medication Sig Start Date End Date Taking? Authorizing Provider  digoxin (LANOXIN) 0.125 MG tablet Take 1 tablet (125 mcg total) by mouth daily. 01/02/16  Yes Marletta Lor, MD  DILT-XR 120 MG 24 hr capsule Take 120 mg by  mouth daily. 12/12/15  Yes Historical Provider, MD  escitalopram (LEXAPRO) 10 MG tablet TAKE 1 TABLET(10 MG) BY MOUTH DAILY 11/07/15  Yes Marletta Lor, MD  gabapentin (NEURONTIN) 300 MG capsule Take 1 capsule (300 mg total) by mouth 2 (two) times daily. 05/15/15  Yes Marletta Lor, MD  mirabegron ER (MYRBETRIQ) 25 MG TB24 tablet Take 1 tablet (25 mg total) by mouth daily. 10/11/15  Yes Marletta Lor, MD  QUEtiapine (SEROQUEL) 25 MG tablet Take 1 tablet (25 mg total) by mouth 2 (two) times daily. 08/18/15  Yes Marletta Lor, MD  traMADol (ULTRAM) 50 MG tablet Take 1 tablet (50 mg total) by mouth every 6 (six) hours as needed. Patient taking differently: Take 50 mg by mouth every 6 (six) hours as needed for moderate pain.  01/02/16  Yes Marletta Lor, MD  traZODone (DESYREL) 150 MG tablet TAKE 1 TABLET BY MOUTH EVERY NIGHT AT BEDTIME Patient taking differently: Take 150 mg by mouth at bedtime 08/15/15  Yes Marletta Lor, MD  atorvastatin (LIPITOR) 20 MG tablet Take 1 tablet (20 mg total) by mouth daily. Patient not taking: Reported on 02/07/2016 05/15/15   Marletta Lor, MD    Physical Exam: Vitals:   02/08/16 0313 02/08/16 0500 02/08/16 0515 02/08/16 0528  BP: 157/55 179/82    Pulse: 72  76 78  Resp: 14   12  Temp:      TempSrc:      SpO2: 94%  94% 93%   General: Not in acute distress HEENT:       Eyes: PERRL, EOMI, no scleral icterus.       ENT: No discharge from the ears and nose, no pharynx injection, no tonsillar enlargement.        Neck: No JVD, no bruit, no mass felt. Heme: No neck lymph node enlargement. Cardiac: [id number], RRR, 1/6 systolic murmurs, No gallops or rubs. Respiratory:  No rales, wheezing, rhonchi or rubs. GI: Soft, nondistended, nontender, no rebound pain, no organomegaly, BS present. GU: No hematuria Ext: has trace leg edema bilaterally. 2+DP/PT pulse bilaterally. Musculoskeletal: has bruise in right knee with tenderness. Skin: No  rashes.  Neuro: confused, but oriented X3 currently, cranial nerves II-XII grossly intact. Muscle strength 5/5 in all extremities, sensation to light touch intact. Brachial reflex 2+ bilaterally. Negative Babinski's sign. Has resting tremor in R hand. Psych: Patient is not psychotic, no suicidal or hemocidal ideation.  Labs on Admission: I have personally reviewed following labs and imaging studies  CBC:  Recent Labs Lab 02/07/16 2021 02/08/16 0514  WBC 7.5 7.4  NEUTROABS 5.5  --   HGB 11.7* 11.8*  HCT 37.3 38.4  MCV 83.8 84.0  PLT 214 [id number]   Basic Metabolic Panel:  Recent Labs Lab 02/07/16 2021  NA 141  K 3.7  CL 106  CO2 27  GLUCOSE 109*  BUN 11  CREATININE 1.21*  CALCIUM 9.1   GFR: CrCl cannot be calculated (Unknown ideal weight.). Liver Function Tests:  Recent Labs Lab 02/07/16 2021  AST 25  ALT 17  ALKPHOS 59  BILITOT 0.5  PROT 6.8  ALBUMIN 3.7   No results for input(s): LIPASE, AMYLASE in the last 168 hours. No results for input(s): AMMONIA in the last 168 hours. Coagulation Profile:  Recent Labs Lab 02/03/16 0138 02/07/16 2021  INR 1.34 1.19   Cardiac Enzymes:  Recent Labs Lab 02/07/16 2021 02/07/16 2247  CKTOTAL  --  74  TROPONINI 0.03* 0.03*   BNP (last 3 results) No results for input(s): PROBNP in the last 8760 hours. HbA1C: No results for input(s): HGBA1C in the last 72 hours. CBG: No results for input(s): GLUCAP in the last 168 hours. Lipid Profile: No results for input(s): CHOL, HDL, LDLCALC, TRIG, CHOLHDL, LDLDIRECT in the last 72 hours. Thyroid Function Tests: No results for input(s): TSH, T4TOTAL, FREET4, T3FREE, THYROIDAB in the last 72 hours. Anemia Panel: No results for input(s): VITAMINB12, FOLATE, FERRITIN, TIBC, IRON, RETICCTPCT in the last 72 hours. Urine analysis:    Component Value Date/Time   COLORURINE YELLOW 02/07/2016 1959   APPEARANCEUR CLOUDY (A) 02/07/2016 1959   LABSPEC 1.014 02/07/2016 1959   PHURINE  6.5 02/07/2016 1959   GLUCOSEU NEGATIVE 02/07/2016 1959   HGBUR NEGATIVE 02/07/2016 1959   BILIRUBINUR NEGATIVE 02/07/2016 1959   BILIRUBINUR n 07/03/2015 [place] 02/07/2016 1959   PROTEINUR NEGATIVE 02/07/2016 1959   UROBILINOGEN 1.0 07/03/2015 1457   UROBILINOGEN 0.2 08/04/2014 0657   NITRITE NEGATIVE 02/07/2016 1959   LEUKOCYTESUR NEGATIVE 02/07/2016 1959   Sepsis Labs: @LABRCNTIP (procalcitonin:4,lacticidven:4) )No results found for this or any previous visit (from the past 240 hour(s)).   Radiological Exams on Admission: Dg Chest 2 View  Result Date: 02/07/2016 CLINICAL DATA:  Recent fall with altered mental status EXAM: CHEST  2 VIEW COMPARISON:  12/06/2015 FINDINGS: Cardiac shadow is stable. Thickening of the minor and major fissures are again identified on the right and stable. No focal infiltrate or sizable effusion is noted. No acute bony abnormality is noted. IMPRESSION: Thickening of the major and minor fissures on the right. No acute abnormality is noted. Electronically Signed   By: Inez Catalina M.D.   On: 02/07/2016 21:51   Ct Head Wo Contrast  Result Date: 02/07/2016 CLINICAL DATA:  Altered mental status EXAM: CT HEAD WITHOUT CONTRAST TECHNIQUE: Contiguous axial images were obtained from the base of the skull through the vertex without intravenous contrast. COMPARISON:  02/03/2016 FINDINGS: Brain: No evidence of acute infarction, hemorrhage, hydrocephalus, extra-axial collection or mass lesion/mass effect. Mild chronic white matter ischemic change and mild atrophy are seen. Vascular: No hyperdense vessel or unexpected calcification. Skull: Normal. Negative for fracture or focal lesion. Sinuses/Orbits: No acute finding. Other: None. IMPRESSION: No acute intracranial abnormality noted. Electronically Signed   By: Inez Catalina M.D.   On: 02/07/2016 19:40   Mr Brain Wo Contrast  Result Date: 02/08/2016 CLINICAL DATA:  Fall.  Altered mental status. EXAM: MRI  HEAD WITHOUT CONTRAST TECHNIQUE: Multiplanar, multiecho pulse sequences of the brain and surrounding structures were obtained without intravenous contrast. COMPARISON:  Head CT 02/07/2016 FINDINGS: Brain: No acute infarct or intraparenchymal hemorrhage. There is beginning confluent hyperintense T2-weighted signal within the periventricular white matter, mildly asymmetric T2 hyperintensity and volume loss of the left hippocampus. No mass lesion or midline shift. No hydrocephalus or extra-axial fluid collection. No age advanced atrophy. The midline structures are normal. Vascular: Major intracranial arterial and venous sinus flow voids are preserved. No evidence of chronic microhemorrhage or amyloid angiopathy. Skull and upper cervical spine:  The visualized skull base, calvarium, upper cervical spine and extracranial soft tissues are normal. Sinuses/Orbits: No fluid levels or advanced mucosal thickening. No mastoid effusion. Normal orbits. IMPRESSION: 1. No acute intracranial abnormality. 2. Mild atrophy and findings of chronic microvascular ischemia. Electronically Signed   By: Ulyses Jarred M.D.   On: 02/08/2016 05:21   Dg Knee Complete 4 Views Right  Result Date: 02/07/2016 CLINICAL DATA:  Status post fall, with right knee pain. Initial encounter. EXAM: RIGHT KNEE - COMPLETE 4+ VIEW COMPARISON:  None. FINDINGS: There is no evidence of fracture or dislocation. A bone island is noted at the proximal tibia. The joint spaces are preserved. No significant degenerative change is seen; the patellofemoral joint is grossly unremarkable in appearance. No significant joint effusion is seen. The visualized soft tissues are normal in appearance. IMPRESSION: No evidence of fracture or dislocation. Electronically Signed   By: Garald Balding M.D.   On: 02/07/2016 23:49   Dg Hips Bilat With Pelvis 3-4 Views  Result Date: 02/07/2016 CLINICAL DATA:  Bilateral hip pain following fall, initial encounter EXAM: DG HIP (WITH OR  WITHOUT PELVIS) 3-4V BILAT COMPARISON:  None. FINDINGS: Pelvic ring is intact. No acute fracture or dislocation is noted. No soft tissue abnormality is noted. IMPRESSION: No acute abnormality noted. Electronically Signed   By: Inez Catalina M.D.   On: 02/07/2016 21:49     EKG: Independently reviewed.  QTC 343, nonspecific T-wave change   Assessment/Plan Principal Problem:   Hypertensive urgency Active Problems:   Bipolar disorder (HCC)   Chronic atrial fibrillation (HCC)   Acute encephalopathy   Elevated troponin   Tremor of right hand   Fall   Acute renal failure superimposed on stage 3 chronic kidney disease (HCC)   Hypertensive urgency: Blood pressure 212/77-->205/87. Pt is on Dilt-Xr at home. -will admit to tele bed as inpt -will give one dose of 10 mg IV hydralazine, then prn hydralazine 5 mg q2h -start low dose of coreg 3.125 mg bid -continue Dilt-XR  Fall: Likely due to multifactorial etiology, including possible dementia, possible Parkinson's disease given resting hand tremor and generalized deconditioning. Pt has chronic A fib, but not on anticoagulants and has poor balance, at high risk of getting stroke. CT-head is negative for acute intracranial abnormalities. No hip fracture by X-ray.  -check X-ray of right knee -continue when necessary tramadol for pain -MRI-brain to r/o stroke -PT/OT  Acute encephalopathy: Possibly due to hypertensive encephalopathy, but cannot rule out stroke. -Follow-up MRI for brain -Frequent neuro check  Elevated troponin: Troponin 0.03. EKG has nonspecific T-wave change. Most likely due to demand ischemia secondary to hypertensive urgency. Patient denies chest pain. - Nitroglycerin prn, and aspirin, lipitor, coreg - Risk factor stratification: will check FLP and A1C  - 2d echo  Tremor of right hand: Unclear etiology, possible Parkinson's disease -May need neurology evaluation  Atrial Fibrillation: CHA2DS2-VASc Score is 4, needs oral  anticoagulation, but pt is not on AC at home due to high risk of fall. Heart rate is well controlled. -continue Didgoxin, and check digoxin level -continue Dilt-Xr -started coreg at low dose  Bipolar disorder and Depression: Stable -Continue home medications: Lexapro, Seroquel,   DVT ppx: SQ Lovenox Code Status: Full code Family Communication: Yes, patient's daughter at bed side Disposition Plan:  Anticipate discharge back to previous independent living environment Consults called:  none Admission status:  Inpatient/tele     Date of Service 02/08/2016    Lafayette Dunlevy, [facility] Pager [phone]  If 7PM-7AM, please contact night-coverage [email or web address] Password [facility] 02/08/2016, 5:36 AM

## 2016-02-07 NOTE — ED Notes (Signed)
The pt just returned from c-t or xray

## 2016-02-07 NOTE — ED Notes (Signed)
To ct

## 2016-02-07 NOTE — ED Notes (Signed)
Pt was able to follow commands to get into a wheelchair and use the restroom. Pt needed assistance with standing and ambulating.

## 2016-02-07 NOTE — ED Triage Notes (Signed)
The pt arrived by [facility] from [place] she apparently fell last pm 0400am and the daughter that was with her told ems that she was acting different than usual although the [place].  The pt is alert answers some questions but otherwise just stares.  She makes eye contact but appears not to understand directions.  She cannot tell me her name or the day of the week the month or the year  She just shakes her head when questioned.  She did stick out her tongue when I showed her how to but no other commands did she follow .  When I asked if she was cold she just nodded her head.  She is moving both arms voluntarily but not as requested

## 2016-02-07 NOTE — ED Notes (Signed)
The pt does not folllow any commands  Cannot finish neuro assessments

## 2016-02-07 NOTE — ED Notes (Signed)
Pt to c-t or xray .  Frequent urination

## 2016-02-07 NOTE — ED Provider Notes (Signed)
[facility] DEPT Provider Note   CSN: [id number] Arrival date & time: 02/07/16  1834     History   Chief Complaint Chief Complaint  Patient presents with  . Altered Mental Status    HPI Tracy Rodriguez is a 82 y.o. female.  HPI  Patient presents from her nursing facility with concern of decreased interactivity, anorexia. The patient is here with her daughter who provides the history of present illness the patient is essentially nonverbal, though she occasionally has some interactivity, with brief appropriate responses. Daughter states that over the past month or so the patient has had a notable decline in her general condition, and over the past 2 days in particular has been much less interactive, weaker, not willing to stand, with no demonstration of hunger or thirst. Patient also had a minor fall during this illness, one week ago, after which she was evaluated at our affiliated facility. In the past few days with some concern for patient's inability to administer her own medication, medications have been provided by caregivers, but no noted change in dosage or medications themselves. Level V caveat secondary to change in mental status.  Past Medical History:  Diagnosis Date  . Arthritis    "joints" (08/04/2014)  . Bipolar 1 disorder ([place])   . Cancer of right breast ([place])   . Chronic lower back pain   . Hyperlipemia   . Hypertension   . PAF (paroxysmal atrial fibrillation) [facility])     Patient Active Problem List   Diagnosis Date Noted  . Syncope 12/05/2015  . Physical deconditioning 12/05/2015  . Acute kidney injury ([place]) 12/05/2015  . Dehydration, moderate 12/05/2015  . Acute hypokalemia 12/05/2015  . Abnormal chest x-ray 12/05/2015  . Encounter for therapeutic drug monitoring 12/22/2014  . Atrial fibrillation with RVR ([place]) 08/04/2014  . Chronic atrial fibrillation ([place]) 08/04/2014  . Essential hypertension   . Obesity 09/27/2013  . Chronic back pain 09/14/2012    . Bipolar disorder ([place]) 09/14/2012    Past Surgical History:  Procedure Laterality Date  . BACK SURGERY    . BREAST BIOPSY Right   . BREAST RECONSTRUCTION Right   . CATARACT EXTRACTION W/ INTRAOCULAR LENS  IMPLANT, BILATERAL Bilateral   . EXCISIONAL HEMORRHOIDECTOMY    . [place]; 1973; 1985   "ruptured discs each time"  . MASTECTOMY Right    cancer  . PLACEMENT OF BREAST IMPLANTS Bilateral    "had to take tissue out of left"  . TONSILLECTOMY    . VAGINAL HYSTERECTOMY      OB History    No data available       Home Medications    Prior to Admission medications   Medication Sig Start Date End Date Taking? Authorizing Provider  atorvastatin (LIPITOR) 20 MG tablet Take 1 tablet (20 mg total) by mouth daily. 05/15/15   Marletta Lor, MD  digoxin (LANOXIN) 0.125 MG tablet Take 1 tablet (125 mcg total) by mouth daily. 01/02/16   Marletta Lor, MD  escitalopram (LEXAPRO) 10 MG tablet TAKE 1 TABLET(10 MG) BY MOUTH DAILY Patient taking differently: Take 10 mg by mouth once a day 11/07/15   Marletta Lor, MD  gabapentin (NEURONTIN) 300 MG capsule Take 1 capsule (300 mg total) by mouth 2 (two) times daily. 05/15/15   Marletta Lor, MD  mirabegron ER (MYRBETRIQ) 25 MG TB24 tablet Take 1 tablet (25 mg total) by mouth daily. 10/11/15   Marletta Lor, MD  QUEtiapine (SEROQUEL)  25 MG tablet Take 1 tablet (25 mg total) by mouth 2 (two) times daily. 08/18/15   Marletta Lor, MD  traMADol (ULTRAM) 50 MG tablet Take 1 tablet (50 mg total) by mouth every 6 (six) hours as needed. 01/02/16   Marletta Lor, MD  traZODone (DESYREL) 150 MG tablet TAKE 1 TABLET BY MOUTH EVERY NIGHT AT BEDTIME Patient taking differently: Take 150 mg by mouth at bedtime 08/15/15   Marletta Lor, MD  warfarin (COUMADIN) 5 MG tablet Take as directed by anticoagulation clinic. Patient taking differently: Take 2.5-5 mg by mouth See admin instructions. 2.5 mg in the  morning on Sun/Tues/Wed/Thurs/Fri/Sat and 5 mg on Mon 11/15/15   Marletta Lor, MD    Family History Family History  Problem Relation Age of Onset  . Stroke Mother   . Heart disease Father   . Alzheimer's disease Sister   . Leukemia Brother   . Arthritis Maternal Grandmother   . Breast cancer Daughter     Social History Social History  Substance Use Topics  . Smoking status: Never Smoker  . Smokeless tobacco: Never Used  . Alcohol use No     Allergies   Codeine; Sulfa antibiotics; Darvon [propoxyphene]; Floxin [ofloxacin]; Levsin [hyoscyamine sulfate]; Penicillins; Valium [diazepam]; and Zantac [ranitidine hcl]   Review of Systems Review of Systems  Unable to perform ROS: Acuity of condition     Physical Exam Updated Vital Signs BP (!) 212/77 (BP Location: Right Arm)   Pulse 71   Temp 98.5 F (36.9 C) (Oral)   Resp 15   LMP 10/10/1969   SpO2 100%   Physical Exam  Constitutional: She appears ill. No distress.  HENT:  Head: Normocephalic and atraumatic.  Eyes: Conjunctivae and EOM are normal.  Cardiovascular: Normal rate and regular rhythm.   Pulmonary/Chest: Effort normal and breath sounds normal. No stridor. No respiratory distress.  Abdominal: She exhibits no distension.  Musculoskeletal: She exhibits no edema.  Neurological: She displays atrophy and tremor. She displays no seizure activity.  Patient has inconsistent verbal responses, does not follow commands reliably, but does occasionally move all extremities spontaneously.  Skin: Skin is warm and dry.  Psychiatric: She is slowed and withdrawn. Cognition and memory are impaired.  Nursing note and vitals reviewed.    ED Treatments / Results  Labs (all labs ordered are listed, but only abnormal results are displayed) Labs Reviewed  COMPREHENSIVE METABOLIC PANEL - Abnormal; Notable for the following:       Result Value   Glucose, Bld 109 (*)    Creatinine, Ser 1.21 (*)    GFR calc non Af Amer 41  (*)    GFR calc Af Amer 47 (*)    All other components within normal limits  CBC WITH DIFFERENTIAL/PLATELET - Abnormal; Notable for the following:    Hemoglobin 11.7 (*)    RDW 20.8 (*)    All other components within normal limits  TROPONIN I - Abnormal; Notable for the following:    Troponin I 0.03 (*)    All other components within normal limits  URINALYSIS, ROUTINE W REFLEX MICROSCOPIC (NOT AT [facility]) - Abnormal; Notable for the following:    APPearance CLOUDY (*)    All other components within normal limits  APTT  ETHANOL  PROTIME-INR  RAPID URINE DRUG SCREEN, HOSP PERFORMED  DIGOXIN LEVEL  CREATININE, URINE, RANDOM  SODIUM, URINE, RANDOM  CK  HEMOGLOBIN A1C  LIPID PANEL  BASIC METABOLIC PANEL  CBC  TROPONIN  I  TROPONIN I  TROPONIN I    EKG  EKG Interpretation  Date/Time:  Wednesday February 07 2016 18:36:27 EST Ventricular Rate:  72 PR Interval:    QRS Duration: 94 QT Interval:  313 QTC Calculation: 343 R Axis:   -31 Text Interpretation:  Sinus rhythm Left axis deviation T wave abnormality Artifact Abnormal ekg Confirmed by Carmin Muskrat  MD [phone]) on 02/07/2016 8:05:24 PM       Radiology Ct Head Wo Contrast  Result Date: 02/07/2016 CLINICAL DATA:  Altered mental status EXAM: CT HEAD WITHOUT CONTRAST TECHNIQUE: Contiguous axial images were obtained from the base of the skull through the vertex without intravenous contrast. COMPARISON:  02/03/2016 FINDINGS: Brain: No evidence of acute infarction, hemorrhage, hydrocephalus, extra-axial collection or mass lesion/mass effect. Mild chronic white matter ischemic change and mild atrophy are seen. Vascular: No hyperdense vessel or unexpected calcification. Skull: Normal. Negative for fracture or focal lesion. Sinuses/Orbits: No acute finding. Other: None. IMPRESSION: No acute intracranial abnormality noted. Electronically Signed   By: Inez Catalina M.D.   On: 02/07/2016 19:40    Procedures Procedures (including  critical care time)  Medications Ordered in ED Medications  digoxin (LANOXIN) tablet 125 mcg (not administered)  traMADol (ULTRAM) tablet 50 mg (not administered)  escitalopram (LEXAPRO) tablet 10 mg (not administered)  mirabegron ER (MYRBETRIQ) tablet 25 mg (not administered)  QUEtiapine (SEROQUEL) tablet 25 mg (not administered)  traZODone (DESYREL) tablet 150 mg (not administered)  gabapentin (NEURONTIN) capsule 300 mg (not administered)  0.9 %  sodium chloride infusion ( Intravenous New Bag/Given 02/07/16 2342)  aspirin tablet 325 mg (not administered)  enoxaparin (LOVENOX) injection 40 mg (not administered)  sodium chloride flush (NS) 0.9 % injection 3 mL (3 mLs Intravenous Given 02/07/16 2346)  acetaminophen (TYLENOL) tablet 650 mg (not administered)    Or  acetaminophen (TYLENOL) suppository 650 mg (not administered)  nitroGLYCERIN (NITROSTAT) SL tablet 0.4 mg (not administered)  carvedilol (COREG) tablet 3.125 mg (not administered)  atorvastatin (LIPITOR) tablet 40 mg (not administered)  diltiazem (CARDIZEM CD) 24 hr capsule 120 mg (not administered)  hydrALAZINE (APRESOLINE) injection 5 mg (not administered)  sodium chloride 0.9 % bolus 500 mL (0 mLs Intravenous Stopped 02/07/16 2143)  hydrALAZINE (APRESOLINE) injection 10 mg (10 mg Intravenous Given 02/07/16 2246)  labetalol (NORMODYNE,TRANDATE) injection 10 mg (10 mg Intravenous Given 02/07/16 2340)     Initial Impression / Assessment and Plan / ED Course  I have reviewed the triage vital signs and the nursing notes.  Pertinent labs & imaging results that were available during my care of the patient were reviewed by me and considered in my medical decision making (see chart for details).  Clinical Course     After initial evaluation the patient has several daughters arrived, they corroborate the history.  After the patient received hydralazine and blood pressure did not change substantially, she will receive  labetalol as well. She does seem to have some improvement in her interactivity, though this is inconsistent.  With concern for hypertensive urgency, elevated troponin, change in mental status, the patient required admission for further evaluation and management.  Final Clinical Impressions(s) / ED Diagnoses   Final diagnoses:  Delirium  Hypertensive urgency  Elevated troponin  Right knee injury     Carmin Muskrat, MD 02/07/16 2358

## 2016-02-08 ENCOUNTER — Inpatient Hospital Stay (HOSPITAL_COMMUNITY): Payer: Medicare Other

## 2016-02-08 DIAGNOSIS — N183 Chronic kidney disease, stage 3 unspecified: Secondary | ICD-10-CM | POA: Diagnosis present

## 2016-02-08 DIAGNOSIS — N179 Acute kidney failure, unspecified: Secondary | ICD-10-CM | POA: Diagnosis present

## 2016-02-08 LAB — CBC
HCT: 38.4 % (ref 36.0–46.0)
Hemoglobin: 11.8 g/dL — ABNORMAL LOW (ref 12.0–15.0)
MCH: 25.8 pg — ABNORMAL LOW (ref 26.0–34.0)
MCHC: 30.7 g/dL (ref 30.0–36.0)
MCV: 84 fL (ref 78.0–100.0)
Platelets: 214 10*3/uL (ref 150–400)
RBC: 4.57 MIL/uL (ref 3.87–5.11)
RDW: 20.9 % — ABNORMAL HIGH (ref 11.5–15.5)
WBC: 7.4 10*3/uL (ref 4.0–10.5)

## 2016-02-08 LAB — URINALYSIS, ROUTINE W REFLEX MICROSCOPIC
Bilirubin Urine: NEGATIVE
Glucose, UA: NEGATIVE mg/dL
Hgb urine dipstick: NEGATIVE
Ketones, ur: NEGATIVE mg/dL
Leukocytes, UA: NEGATIVE
Nitrite: NEGATIVE
Protein, ur: NEGATIVE mg/dL
Specific Gravity, Urine: 1.008 (ref 1.005–1.030)
pH: 7.5 (ref 5.0–8.0)

## 2016-02-08 LAB — BASIC METABOLIC PANEL
Anion gap: 9 (ref 5–15)
BUN: 11 mg/dL (ref 6–20)
CO2: 27 mmol/L (ref 22–32)
Calcium: 8.9 mg/dL (ref 8.9–10.3)
Chloride: 107 mmol/L (ref 101–111)
Creatinine, Ser: 0.95 mg/dL (ref 0.44–1.00)
GFR calc Af Amer: >60 mL/min (ref >60)
GFR calc non Af Amer: 54 mL/min — ABNORMAL LOW (ref >60)
Glucose, Bld: 103 mg/dL — ABNORMAL HIGH (ref 65–99)
Potassium: 3.4 mmol/L — ABNORMAL LOW (ref 3.5–5.1)
Sodium: 143 mmol/L (ref 135–145)

## 2016-02-08 LAB — TROPONIN I
Troponin I: 0.03 ng/mL (ref <0.03)
Troponin I: 0.04 ng/mL (ref <0.03)
Troponin I: 0.05 ng/mL (ref <0.03)

## 2016-02-08 LAB — CREATININE, URINE, RANDOM: Creatinine, Urine: 21.81 mg/dL

## 2016-02-08 LAB — HEMOGLOBIN A1C
Hgb A1c MFr Bld: 5.2 % (ref 4.8–5.6)
Mean Plasma Glucose: 103 mg/dL

## 2016-02-08 LAB — LIPID PANEL
Cholesterol: 172 mg/dL (ref 0–200)
HDL: 52 mg/dL (ref >40)
LDL Cholesterol: 106 mg/dL — ABNORMAL HIGH (ref 0–99)
Total CHOL/HDL Ratio: 3.3 RATIO
Triglycerides: 68 mg/dL (ref <150)
VLDL: 14 mg/dL (ref 0–40)

## 2016-02-08 LAB — SODIUM, URINE, RANDOM: Sodium, Ur: 146 mmol/L

## 2016-02-08 LAB — CK: Total CK: 74 U/L (ref 38–234)

## 2016-02-08 MED ORDER — METOPROLOL TARTRATE 5 MG/5ML IV SOLN
5.0000 mg | Freq: Three times a day (TID) | INTRAVENOUS | Status: DC
  Administered 2016-02-08 (×2): 5 mg via INTRAVENOUS
  Filled 2016-02-08 (×2): qty 5

## 2016-02-08 MED ORDER — LORAZEPAM 2 MG/ML IJ SOLN
0.5000 mg | Freq: Once | INTRAMUSCULAR | Status: AC
  Administered 2016-02-08: 0.5 mg via INTRAVENOUS
  Filled 2016-02-08: qty 1

## 2016-02-08 MED ORDER — LORAZEPAM 1 MG PO TABS
1.0000 mg | ORAL_TABLET | Freq: Once | ORAL | Status: AC
  Administered 2016-02-08: 1 mg via ORAL
  Filled 2016-02-08: qty 1

## 2016-02-08 MED ORDER — HYDRALAZINE HCL 20 MG/ML IJ SOLN
10.0000 mg | INTRAMUSCULAR | Status: DC | PRN
  Administered 2016-02-09 – 2016-02-10 (×2): 10 mg via INTRAVENOUS
  Filled 2016-02-08 (×3): qty 1

## 2016-02-08 MED ORDER — ASPIRIN 300 MG RE SUPP
150.0000 mg | Freq: Every day | RECTAL | Status: DC
  Administered 2016-02-08 – 2016-02-10 (×3): 150 mg via RECTAL
  Filled 2016-02-08 (×6): qty 1

## 2016-02-08 NOTE — Progress Notes (Addendum)
Patient ID: Tracy Arnstein, female   DOB: Apr 07, 1931, 82 y.o.   MRN: [id number]    PROGRESS NOTE    Mervin Sandifer  [id number] DOB: [birth year] DOA: 02/07/2016  PCP: [name], MD   Brief Narrative:   82 y.o. female with medical history significant of hypertension, hyperlipidemia, depression, PAF and not on anticoagulants, breast cancer (s/p of right mastectomy, implant), bipolar disorder, chronic back pain, CKD-3, possible dementia, who presented with worsening mental status and fall. Pt fell on her right side and had right side knee pain.   Assessment & Plan:   Hypertensive urgency - lood pressure 212/77-->205/87 - SBP in 170's this AM  - currently on Diltiazem, Coreg, Metoprolol - no clear why on double beta blocker  - will stop Metoprolol and with place on Hydralazine as needed  - monitor on telemetry unit   Fall - Likely due to multifactorial etiology, including possible dementia, possible Parkinson's disease given resting hand tremor and generalized deconditioning.  - Pt has chronic A fib, but not on anticoagulants and has poor balance, at high risk of getting stroke.  - CT-head is negative for acute intracranial abnormalities.  - XRAY of the knee with no acute abnormalities  - continue when necessary tramadol for pain - MRI brain with no evidence of acute stroke   Acute encephalopathy - Possibly due to hypertensive encephalopathy - per daughter, pt appears to be at baseline mental status   Elevated troponin - Troponin 0.03. EKG has nonspecific T-wave change.  - Most likely due to demand ischemia secondary to hypertensive urgency. Patient denies chest pain - would not check troponins unless pt with chest pain   Hypokalemia - supplement and repeat BMP In AM  Tremor of right hand:  - Unclear etiology, possible Parkinson's disease - May need neurology evaluation  Atrial Fibrillation - CHA2DS2-VASc Score is 4, needs oral anticoagulation, but pt is  not on AC at home due to high risk of fall. Heart rate is well controlled. - continue Didgoxin -continue Dilt-Xr  Bipolar disorder and Depression - stable  - Continue home medications: Lexapro, Seroquel  DVT prophylaxis: Lovenox SQ Code Status: Full  Family Communication: Patient and daughter at bedside  Disposition Plan: To be determined, need PT eval   Consultants:  None  Procedures:   None  Antimicrobials:   None   Subjective: No events overnight.   Objective: Vitals:   02/08/16 1339 02/08/16 1350 02/08/16 1353 02/08/16 1653  BP:  114/94  (!) 176/77  Pulse: 70   64  Resp: 18   16  Temp:    98.6 F (37 C)  TempSrc:    Oral  SpO2: 97%  94% 92%    Intake/Output Summary (Last 24 hours) at 02/08/16 1745 Last data filed at 02/08/16 1234  Gross per 24 hour  Intake              600 ml  Output             1600 ml  Net            -1000 ml   There were no vitals filed for this visit.  Examination:  General exam: Appears calm and comfortable  Respiratory system: Respiratory effort normal. Cardiovascular system: IRRR. No JVD, rubs, gallops or clicks. No pedal edema. Gastrointestinal system: Abdomen is nondistended, soft and nontender. No organomegaly or masses felt.  Central nervous system: Alert and oriented. No focal neurological deficits. Extremities: Symmetric 5 x 5 power.  Data Reviewed:  I have personally reviewed following labs and imaging studies  CBC:  Recent Labs Lab 02/07/16 2021 02/08/16 0514  WBC 7.5 7.4  NEUTROABS 5.5  --   HGB 11.7* 11.8*  HCT 37.3 38.4  MCV 83.8 84.0  PLT 214 [id number]   Basic Metabolic Panel:  Recent Labs Lab 02/07/16 2021 02/08/16 0514  NA 141 143  K 3.7 3.4*  CL 106 107  CO2 27 27  GLUCOSE 109* 103*  BUN 11 11  CREATININE 1.21* 0.95  CALCIUM 9.1 8.9   Liver Function Tests:  Recent Labs Lab 02/07/16 2021  AST 25  ALT 17  ALKPHOS 59  BILITOT 0.5  PROT 6.8  ALBUMIN 3.7   Coagulation Profile:  Recent  Labs Lab 02/03/16 0138 02/07/16 2021  INR 1.34 1.19   Cardiac Enzymes:  Recent Labs Lab 02/07/16 2021 02/07/16 2247 02/08/16 0514 02/08/16 1120  CKTOTAL  --  74  --   --   TROPONINI 0.03* 0.03* 0.04* 0.05*   Lipid Profile:  Recent Labs  02/08/16 0514  CHOL 172  HDL 52  LDLCALC 106*  TRIG 68  CHOLHDL 3.3   Urine analysis:    Component Value Date/Time   COLORURINE YELLOW 02/08/2016 0300   APPEARANCEUR CLEAR 02/08/2016 0300   LABSPEC 1.008 02/08/2016 0300   PHURINE 7.5 02/08/2016 0300   GLUCOSEU NEGATIVE 02/08/2016 0300   HGBUR NEGATIVE 02/08/2016 0300   BILIRUBINUR NEGATIVE 02/08/2016 0300   BILIRUBINUR n 07/03/2015 1457   KETONESUR NEGATIVE 02/08/2016 0300   PROTEINUR NEGATIVE 02/08/2016 0300   UROBILINOGEN 1.0 07/03/2015 1457   UROBILINOGEN 0.2 08/04/2014 0657   NITRITE NEGATIVE 02/08/2016 0300   LEUKOCYTESUR NEGATIVE 02/08/2016 0300   Radiology Studies: Dg Chest 2 View  Result Date: 02/07/2016 CLINICAL DATA:  Recent fall with altered mental status EXAM: CHEST  2 VIEW COMPARISON:  12/06/2015 FINDINGS: Cardiac shadow is stable. Thickening of the minor and major fissures are again identified on the right and stable. No focal infiltrate or sizable effusion is noted. No acute bony abnormality is noted. IMPRESSION: Thickening of the major and minor fissures on the right. No acute abnormality is noted. Electronically Signed   By: Inez Catalina M.D.   On: 02/07/2016 21:51   Ct Head Wo Contrast  Result Date: 02/07/2016 CLINICAL DATA:  Altered mental status EXAM: CT HEAD WITHOUT CONTRAST TECHNIQUE: Contiguous axial images were obtained from the base of the skull through the vertex without intravenous contrast. COMPARISON:  02/03/2016 FINDINGS: Brain: No evidence of acute infarction, hemorrhage, hydrocephalus, extra-axial collection or mass lesion/mass effect. Mild chronic white matter ischemic change and mild atrophy are seen. Vascular: No hyperdense vessel or unexpected  calcification. Skull: Normal. Negative for fracture or focal lesion. Sinuses/Orbits: No acute finding. Other: None. IMPRESSION: No acute intracranial abnormality noted. Electronically Signed   By: Inez Catalina M.D.   On: 02/07/2016 19:40   Mr Brain Wo Contrast  Result Date: 02/08/2016 CLINICAL DATA:  Fall.  Altered mental status. EXAM: MRI HEAD WITHOUT CONTRAST TECHNIQUE: Multiplanar, multiecho pulse sequences of the brain and surrounding structures were obtained without intravenous contrast. COMPARISON:  Head CT 02/07/2016 FINDINGS: Brain: No acute infarct or intraparenchymal hemorrhage. There is beginning confluent hyperintense T2-weighted signal within the periventricular white matter, mildly asymmetric T2 hyperintensity and volume loss of the left hippocampus. No mass lesion or midline shift. No hydrocephalus or extra-axial fluid collection. No age advanced atrophy. The midline structures are normal. Vascular: Major intracranial arterial and venous sinus flow voids are preserved. No  evidence of chronic microhemorrhage or amyloid angiopathy. Skull and upper cervical spine: The visualized skull base, calvarium, upper cervical spine and extracranial soft tissues are normal. Sinuses/Orbits: No fluid levels or advanced mucosal thickening. No mastoid effusion. Normal orbits. IMPRESSION: 1. No acute intracranial abnormality. 2. Mild atrophy and findings of chronic microvascular ischemia. Electronically Signed   By: Ulyses Jarred M.D.   On: 02/08/2016 05:21   Dg Knee Complete 4 Views Right  Result Date: 02/07/2016 CLINICAL DATA:  Status post fall, with right knee pain. Initial encounter. EXAM: RIGHT KNEE - COMPLETE 4+ VIEW COMPARISON:  None. FINDINGS: There is no evidence of fracture or dislocation. A bone island is noted at the proximal tibia. The joint spaces are preserved. No significant degenerative change is seen; the patellofemoral joint is grossly unremarkable in appearance. No significant joint  effusion is seen. The visualized soft tissues are normal in appearance. IMPRESSION: No evidence of fracture or dislocation. Electronically Signed   By: Garald Balding M.D.   On: 02/07/2016 23:49   Dg Hips Bilat With Pelvis 3-4 Views  Result Date: 02/07/2016 CLINICAL DATA:  Bilateral hip pain following fall, initial encounter EXAM: DG HIP (WITH OR WITHOUT PELVIS) 3-4V BILAT COMPARISON:  None. FINDINGS: Pelvic ring is intact. No acute fracture or dislocation is noted. No soft tissue abnormality is noted. IMPRESSION: No acute abnormality noted. Electronically Signed   By: Inez Catalina M.D.   On: 02/07/2016 21:49      Scheduled Meds: . aspirin  150 mg Rectal Daily  . atorvastatin  40 mg Oral q1800  . carvedilol  3.125 mg Oral BID WC  . digoxin  125 mcg Oral Daily  . diltiazem  120 mg Oral Daily  . enoxaparin (LOVENOX) injection  40 mg Subcutaneous Daily  . escitalopram  10 mg Oral Daily  . gabapentin  300 mg Oral BID  . metoprolol  5 mg Intravenous Q8H  . mirabegron ER  25 mg Oral Daily  . QUEtiapine  25 mg Oral BID  . sodium chloride flush  3 mL Intravenous Q12H  . traZODone  150 mg Oral QHS   Continuous Infusions: . sodium chloride 75 mL/hr at 02/07/16 2342     LOS: 1 day    Time spent: 20 minutes    Faye Ramsay, MD [facility] Pager [phone]  If 7PM-7AM, please contact night-coverage [email or web address] Password [email or web address] 02/08/2016, 5:45 PM

## 2016-02-08 NOTE — Progress Notes (Signed)
Tracy Rodriguez is a 82 y.o. female patient admitted from ED awake,and anxious. Family requested for Ativan to be given, MD. Notified and verbal order for 1mg  Ativan was given.  IV in place, occlusive dsg intact without redness.  Orientation to room, and floor completed with information packet given to patient/family.  Patient declined safety video at this time.  Admission INP armband ID verified with patient/family, and in place.   SR up x 2, fall assessment complete, with patient and family able to verbalize understanding of risk associated with falls, and verbalized understanding to call nsg before up out of bed.  Call light within reach, patient able to voice, and demonstrate understanding.  Skin, clean-dry- intact without evidence of bruising, or skin tears.   No evidence of skin break down noted on exam.     Will cont to eval and treat per MD orders.  Dorris Carnes, RN 02/08/2016 9:03 PM

## 2016-02-08 NOTE — ED Notes (Signed)
Pt taken to MRI  

## 2016-02-08 NOTE — ED Notes (Signed)
Attempted Report 

## 2016-02-08 NOTE — ED Notes (Signed)
Called for hospital bed @0258 

## 2016-02-08 NOTE — ED Notes (Signed)
Pt complaining of persistent lower abd pain. Tremors decreased at this time. Pt bladder scanned and 654ML of urine observed. Dr. Blaine Hamper aware. Foley orders to be placed

## 2016-02-08 NOTE — ED Notes (Signed)
Notified Dr. Blaine Hamper that pts is having increased tremors and verbalizing that her lower abd is hurting. Explained that I can not give pt PO pain medication d/t failed swallow screen on prior shift. Dr. Blaine Hamper requesting repeat swallow screen. I informed him that we are not to repeat swallow screen once failed until evaluated by ST per protocol. He stated he would order medications.

## 2016-02-08 NOTE — ED Notes (Signed)
Bladder scan 654

## 2016-02-09 ENCOUNTER — Inpatient Hospital Stay (HOSPITAL_COMMUNITY): Payer: Medicare Other

## 2016-02-09 DIAGNOSIS — I9589 Other hypotension: Secondary | ICD-10-CM

## 2016-02-09 LAB — ECHOCARDIOGRAM COMPLETE
E decel time: 201 msec
E/e' ratio: 8.87
FS: 25 % — AB (ref 28–44)
IVS/LV PW RATIO, ED: 1.18
LA ID, A-P, ES: 35 mm
LA diam end sys: 35 mm
LA diam index: 1.87 cm/m2
LA vol A4C: 50.3 ml
LA vol index: 31.6 mL/m2
LA vol: 59.1 mL
LV E/e' medial: 8.87
LV E/e'average: 8.87
LV PW d: 11.6 mm — AB (ref 0.6–1.1)
LV e' LATERAL: 5.91 cm/s
Lateral S' vel: 16.9 cm/s
MV Dec: 201
MV pk A vel: 66.9 m/s
MV pk E vel: 52.4 m/s
TAPSE: 22 mm
TDI e' lateral: 5.91
TDI e' medial: 5.57

## 2016-02-09 LAB — CBC
HCT: 35.5 % — ABNORMAL LOW (ref 36.0–46.0)
Hemoglobin: 11 g/dL — ABNORMAL LOW (ref 12.0–15.0)
MCH: 25.8 pg — ABNORMAL LOW (ref 26.0–34.0)
MCHC: 31 g/dL (ref 30.0–36.0)
MCV: 83.3 fL (ref 78.0–100.0)
Platelets: 203 10*3/uL (ref 150–400)
RBC: 4.26 MIL/uL (ref 3.87–5.11)
RDW: 20.7 % — ABNORMAL HIGH (ref 11.5–15.5)
WBC: 6.5 10*3/uL (ref 4.0–10.5)

## 2016-02-09 LAB — BASIC METABOLIC PANEL
Anion gap: 10 (ref 5–15)
BUN: 10 mg/dL (ref 6–20)
CO2: 25 mmol/L (ref 22–32)
Calcium: 8.8 mg/dL — ABNORMAL LOW (ref 8.9–10.3)
Chloride: 107 mmol/L (ref 101–111)
Creatinine, Ser: 1.08 mg/dL — ABNORMAL HIGH (ref 0.44–1.00)
GFR calc Af Amer: 54 mL/min — ABNORMAL LOW (ref >60)
GFR calc non Af Amer: 46 mL/min — ABNORMAL LOW (ref >60)
Glucose, Bld: 99 mg/dL (ref 65–99)
Potassium: 3.7 mmol/L (ref 3.5–5.1)
Sodium: 142 mmol/L (ref 135–145)

## 2016-02-09 LAB — GLUCOSE, CAPILLARY: Glucose-Capillary: 93 mg/dL (ref 65–99)

## 2016-02-09 MED ORDER — LORAZEPAM 2 MG/ML IJ SOLN
0.5000 mg | Freq: Once | INTRAMUSCULAR | Status: AC
Start: 2016-02-09 — End: 2016-02-09
  Administered 2016-02-09: 0.5 mg via INTRAVENOUS
  Filled 2016-02-09: qty 1

## 2016-02-09 MED ORDER — HYDRALAZINE HCL 10 MG PO TABS
10.0000 mg | ORAL_TABLET | Freq: Three times a day (TID) | ORAL | Status: DC
  Administered 2016-02-09 (×3): 10 mg via ORAL
  Filled 2016-02-09 (×4): qty 1

## 2016-02-09 NOTE — Evaluation (Signed)
Physical Therapy Evaluation Patient Details Name: Tracy Rodriguez MRN: [id number] DOB: May 30, 1931 Today's Date: 02/09/2016   History of Present Illness  82 y.o. female with medical history significant of hypertension, hyperlipidemia, depression, PAF and not on anticoagulants, breast cancer (s/p of right mastectomy, implant), bipolar disorder, chronic back pain, CKD-3, possible dementia, who presented with worsening mental status and fall. Pt fell on her right side and had right side knee pain.   Clinical Impression  Pt admitted with above diagnosis. Pt currently with functional limitations due to the deficits listed below (see PT Problem List). +2 min A for bed to recliner transfer, pt has h/o multiple falls, general decline in past 2 months and has been requiring increasing levels of assistance at her ILF. SNF recommended. Pt will benefit from skilled PT to increase their independence and safety with mobility to allow discharge to the venue listed below.       Follow Up Recommendations SNF;Supervision/Assistance - 24 hour    Equipment Recommendations  None recommended by PT    Recommendations for Other Services OT consult     Precautions / Restrictions Precautions Precautions: Fall Precaution Comments: h/o multiple falls Restrictions Weight Bearing Restrictions: No      Mobility  Bed Mobility Overal bed mobility: Needs Assistance Bed Mobility: Sidelying to Sit   Sidelying to sit: Min assist Supine to sit: +2 for safety/equipment     General bed mobility comments: min A for R sidelying to sit  Transfers Overall transfer level: Needs assistance Equipment used: Rolling walker (2 wheeled) Transfers: Sit to/from [facility] Sit to Stand: Min assist;+2 safety/equipment Stand pivot transfers: Min assist;+2 safety/equipment       General transfer comment: min A to rise, min A to maneuver RW with pivot to recliner, shuffling steps with SPT  Ambulation/Gait             General Gait Details: NT -pt lethargic  Stairs            Wheelchair Mobility    Modified Rankin (Stroke Patients Only)       Balance Overall balance assessment: Needs assistance;History of Falls Sitting-balance support: Feet supported;Bilateral upper extremity supported Sitting balance-Leahy Scale: Fair       Standing balance-Leahy Scale: Poor                               Pertinent Vitals/Pain Pain Assessment: Faces Faces Pain Scale: Hurts little more Pain Location: R knee with movement Pain Intervention(s): Limited activity within patient's tolerance;Monitored during session    [place] expects to be discharged to:: Assisted living               Home Equipment: Walker - 2 wheels;Walker - 4 wheels Additional Comments: from [facility]    Prior Function Level of Independence: Needs assistance   Gait / Transfers Assistance Needed: used rollator to walk to dining room up until 2 months ago     Comments: multiple falls, for 2 months has slept for 20-22 hrs/day, recently she's needed an aide to assist with dressing, meals, meds, showering; significant decline 2 weeks ago     Hand Dominance        Extremity/Trunk Assessment   Upper Extremity Assessment: Defer to OT evaluation           Lower Extremity Assessment: Generalized weakness      Cervical / Trunk Assessment: Normal  Communication   Communication: No difficulties  Cognition Arousal/Alertness: Lethargic Behavior During Therapy: WFL for tasks assessed/performed Overall Cognitive Status: Impaired/Different from baseline Area of Impairment: Attention;Memory;Following commands     Memory: Decreased short-term memory Following Commands: Follows one step commands inconsistently       General Comments: varying levels of alertness, pt would briefly open eyes and respond appropriatly to questions/commands, then fall back asleep, family states he  mental status has declined recently    General Comments      Exercises     Assessment/Plan    PT Assessment Patient needs continued PT services  PT Problem List Decreased activity tolerance;Decreased strength;Decreased mobility;Decreased balance;Decreased cognition;Decreased safety awareness;Pain          PT Treatment Interventions Gait training;Functional mobility training;Balance training;Therapeutic exercise;Therapeutic activities;Patient/family education    PT Goals (Current goals can be found in the Care Plan section)  Acute Rehab PT Goals Patient Stated Goal: family would like SNF with hopeful return to [facility]  PT Goal Formulation: With family Time For Goal Achievement: 02/23/16 Potential to Achieve Goals: Fair    Frequency Min 3X/week   Barriers to discharge        Co-evaluation PT/OT/SLP Co-Evaluation/Treatment: Yes Reason for Co-Treatment: Necessary to address cognition/behavior during functional activity;For patient/therapist safety PT goals addressed during session: Mobility/safety with mobility;Proper use of DME;Balance         End of Session Equipment Utilized During Treatment: Gait belt Activity Tolerance: Patient limited by lethargy Patient left: in chair;with call bell/phone within reach;with family/visitor present;with chair alarm set Nurse Communication: Mobility status         Time: 1334-1401 PT Time Calculation (min) (ACUTE ONLY): 27 min   Charges:   PT Evaluation $PT Eval Moderate Complexity: 1 Procedure     PT G CodesPhilomena Doheny 02/09/2016, 2:39 PM [phone]

## 2016-02-09 NOTE — Progress Notes (Addendum)
Patient ID: Tracy Rodriguez, female   DOB: 04/05/1931, 82 y.o.   MRN: [id number]    PROGRESS NOTE    Tracy Rodriguez  [id number] DOB: [birth year] DOA: 02/07/2016  PCP: [name], MD   Brief Narrative:   82 y.o. female with medical history significant of hypertension, hyperlipidemia, depression, PAF and not on anticoagulants, breast cancer (s/p of right mastectomy, implant), bipolar disorder, chronic back pain, CKD-3, possible dementia, who presented with worsening mental status and fall. Pt fell on her right side and had right side knee pain.   Assessment & Plan:   Hypertensive urgency - lood pressure 212/77-->205/87 - SBP in 170's this AM  - currently on Diltiazem, Coreg,  - added Hydralazine scheduled and as needed  - monitor on telemetry unit   Fall - Likely due to multifactorial etiology, including possible dementia, possible Parkinson's disease given resting hand tremor and generalized deconditioning.  - Pt has chronic A fib, but not on anticoagulants and has poor balance, at high risk of getting stroke.  - CT-head is negative for acute intracranial abnormalities.  - XRAY of the knee with no acute abnormalities  - continue when necessary tramadol for pain - MRI brain with no evidence of acute stroke   Acute encephalopathy - Possibly due to hypertensive encephalopathy - pt more somnolent this am but RN says pt got ativan this AM - will continue to monitor, may need neurology consultation if not improving   Elevated troponin - Troponin 0.03. EKG has nonspecific T-wave change.  - Most likely due to demand ischemia secondary to hypertensive urgency. Patient denies chest pain - would not check troponins unless pt with chest pain   Hypokalemia - supplemented and WNL this AM - BMP In AM  Tremor of right hand:  - Unclear etiology, possible Parkinson's disease - May need neurology evaluation  Atrial Fibrillation - CHA2DS2-VASc Score is 4, needs oral  anticoagulation, but pt is not on AC at home due to high risk of fall. Heart rate is well controlled. - continue Didgoxin -continue Dilt-Xr  Bipolar disorder and Depression - stable  - Continue home medications: Lexapro, Seroquel  DVT prophylaxis: Lovenox SQ Code Status: DNR Family Communication: Patient and daughter at bedside  Disposition Plan: To be determined, needs PT eval   Consultants:  None  Procedures:   None  Antimicrobials:   None   Subjective: No events overnight.   Objective: Vitals:   02/08/16 2236 02/09/16 0200 02/09/16 0403 02/09/16 0822  BP: (!) 189/81 (!) 166/100 (!) 182/79 (!) 192/97  Pulse: 76  76 84  Resp: 18  16 18   Temp: 97.3 F (36.3 C)  98.2 F (36.8 C) 98.1 F (36.7 C)  TempSrc: Oral  Oral Oral  SpO2: 95%  95% 98%    Intake/Output Summary (Last 24 hours) at 02/09/16 [id number] Last data filed at 02/09/16 0830  Gross per 24 hour  Intake           2347.5 ml  Output             3000 ml  Net           -652.5 ml   There were no vitals filed for this visit.  Examination:  General exam: Appears somnolent, can awake with sternal rub, quickly doses off   Respiratory system: Respiratory effort normal. Cardiovascular system: IRRR. No JVD, rubs, gallops or clicks. No pedal edema. Gastrointestinal system: Abdomen is nondistended, soft and nontender. No organomegaly or masses felt.  Central nervous system:  somnolent, with sternal rub moving upper and lower extremities spontaneously  Extremities: Symmetric 5 x 5 power.  Data Reviewed: I have personally reviewed following labs and imaging studies  CBC:  Recent Labs Lab 02/07/16 2021 02/08/16 0514 02/09/16 0357  WBC 7.5 7.4 6.5  NEUTROABS 5.5  --   --   HGB 11.7* 11.8* 11.0*  HCT 37.3 38.4 35.5*  MCV 83.8 84.0 83.3  PLT 214 214 [id number]   Basic Metabolic Panel:  Recent Labs Lab 02/07/16 2021 02/08/16 0514 02/09/16 0357  NA 141 143 142  K 3.7 3.4* 3.7  CL 106 107 107  CO2 27 27 25     GLUCOSE 109* 103* 99  BUN 11 11 10   CREATININE 1.21* 0.95 1.08*  CALCIUM 9.1 8.9 8.8*   Liver Function Tests:  Recent Labs Lab 02/07/16 2021  AST 25  ALT 17  ALKPHOS 59  BILITOT 0.5  PROT 6.8  ALBUMIN 3.7   Coagulation Profile:  Recent Labs Lab 02/03/16 0138 02/07/16 2021  INR 1.34 1.19   Cardiac Enzymes:  Recent Labs Lab 02/07/16 2021 02/07/16 2247 02/08/16 0514 02/08/16 1120  CKTOTAL  --  74  --   --   TROPONINI 0.03* 0.03* 0.04* 0.05*   Lipid Profile:  Recent Labs  02/08/16 0514  CHOL 172  HDL 52  LDLCALC 106*  TRIG 68  CHOLHDL 3.3   Urine analysis:    Component Value Date/Time   COLORURINE YELLOW 02/08/2016 0300   APPEARANCEUR CLEAR 02/08/2016 0300   LABSPEC 1.008 02/08/2016 0300   PHURINE 7.5 02/08/2016 0300   GLUCOSEU NEGATIVE 02/08/2016 0300   HGBUR NEGATIVE 02/08/2016 0300   BILIRUBINUR NEGATIVE 02/08/2016 0300   BILIRUBINUR n 07/03/2015 1457   KETONESUR NEGATIVE 02/08/2016 0300   PROTEINUR NEGATIVE 02/08/2016 0300   UROBILINOGEN 1.0 07/03/2015 1457   UROBILINOGEN 0.2 08/04/2014 0657   NITRITE NEGATIVE 02/08/2016 0300   LEUKOCYTESUR NEGATIVE 02/08/2016 0300   Radiology Studies: Dg Chest 2 View  Result Date: 02/07/2016 CLINICAL DATA:  Recent fall with altered mental status EXAM: CHEST  2 VIEW COMPARISON:  12/06/2015 FINDINGS: Cardiac shadow is stable. Thickening of the minor and major fissures are again identified on the right and stable. No focal infiltrate or sizable effusion is noted. No acute bony abnormality is noted. IMPRESSION: Thickening of the major and minor fissures on the right. No acute abnormality is noted. Electronically Signed   By: Inez Catalina M.D.   On: 02/07/2016 21:51   Ct Head Wo Contrast  Result Date: 02/07/2016 CLINICAL DATA:  Altered mental status EXAM: CT HEAD WITHOUT CONTRAST TECHNIQUE: Contiguous axial images were obtained from the base of the skull through the vertex without intravenous contrast.  COMPARISON:  02/03/2016 FINDINGS: Brain: No evidence of acute infarction, hemorrhage, hydrocephalus, extra-axial collection or mass lesion/mass effect. Mild chronic white matter ischemic change and mild atrophy are seen. Vascular: No hyperdense vessel or unexpected calcification. Skull: Normal. Negative for fracture or focal lesion. Sinuses/Orbits: No acute finding. Other: None. IMPRESSION: No acute intracranial abnormality noted. Electronically Signed   By: Inez Catalina M.D.   On: 02/07/2016 19:40   Mr Brain Wo Contrast  Result Date: 02/08/2016 CLINICAL DATA:  Fall.  Altered mental status. EXAM: MRI HEAD WITHOUT CONTRAST TECHNIQUE: Multiplanar, multiecho pulse sequences of the brain and surrounding structures were obtained without intravenous contrast. COMPARISON:  Head CT 02/07/2016 FINDINGS: Brain: No acute infarct or intraparenchymal hemorrhage. There is beginning confluent hyperintense T2-weighted signal within the periventricular white matter, mildly asymmetric T2  hyperintensity and volume loss of the left hippocampus. No mass lesion or midline shift. No hydrocephalus or extra-axial fluid collection. No age advanced atrophy. The midline structures are normal. Vascular: Major intracranial arterial and venous sinus flow voids are preserved. No evidence of chronic microhemorrhage or amyloid angiopathy. Skull and upper cervical spine: The visualized skull base, calvarium, upper cervical spine and extracranial soft tissues are normal. Sinuses/Orbits: No fluid levels or advanced mucosal thickening. No mastoid effusion. Normal orbits. IMPRESSION: 1. No acute intracranial abnormality. 2. Mild atrophy and findings of chronic microvascular ischemia. Electronically Signed   By: Ulyses Jarred M.D.   On: 02/08/2016 05:21   Dg Knee Complete 4 Views Right  Result Date: 02/07/2016 CLINICAL DATA:  Status post fall, with right knee pain. Initial encounter. EXAM: RIGHT KNEE - COMPLETE 4+ VIEW COMPARISON:  None.  FINDINGS: There is no evidence of fracture or dislocation. A bone island is noted at the proximal tibia. The joint spaces are preserved. No significant degenerative change is seen; the patellofemoral joint is grossly unremarkable in appearance. No significant joint effusion is seen. The visualized soft tissues are normal in appearance. IMPRESSION: No evidence of fracture or dislocation. Electronically Signed   By: Garald Balding M.D.   On: 02/07/2016 23:49   Dg Hips Bilat With Pelvis 3-4 Views  Result Date: 02/07/2016 CLINICAL DATA:  Bilateral hip pain following fall, initial encounter EXAM: DG HIP (WITH OR WITHOUT PELVIS) 3-4V BILAT COMPARISON:  None. FINDINGS: Pelvic ring is intact. No acute fracture or dislocation is noted. No soft tissue abnormality is noted. IMPRESSION: No acute abnormality noted. Electronically Signed   By: Inez Catalina M.D.   On: 02/07/2016 21:49      Scheduled Meds: . aspirin  150 mg Rectal Daily  . atorvastatin  40 mg Oral q1800  . carvedilol  3.125 mg Oral BID WC  . digoxin  125 mcg Oral Daily  . diltiazem  120 mg Oral Daily  . enoxaparin (LOVENOX) injection  40 mg Subcutaneous Daily  . escitalopram  10 mg Oral Daily  . gabapentin  300 mg Oral BID  . hydrALAZINE  10 mg Oral Q8H  . mirabegron ER  25 mg Oral Daily  . QUEtiapine  25 mg Oral BID  . sodium chloride flush  3 mL Intravenous Q12H  . traZODone  150 mg Oral QHS   Continuous Infusions: . sodium chloride 75 mL/hr at 02/08/16 2027     LOS: 2 days   Time spent: 20 minutes   Faye Ramsay, MD [facility] Pager [phone]  If 7PM-7AM, please contact night-coverage [email or web address] Password [facility] 02/09/2016, 8:34 AM

## 2016-02-09 NOTE — Evaluation (Signed)
Clinical/Bedside Swallow Evaluation Patient Details  Name: Tracy Rodriguez MRN: [id number] Date of Birth: [birth year]  Today's Date: 02/09/2016 Time: SLP Start Time (ACUTE ONLY): 0935 SLP Stop Time (ACUTE ONLY): 0949 SLP Time Calculation (min) (ACUTE ONLY): 14 min  Past Medical History:  Past Medical History:  Diagnosis Date  . Arthritis    "joints" (08/04/2014)  . Bipolar 1 disorder ([place])   . Cancer of right breast ([place])   . Chronic lower back pain   . Hyperlipemia   . Hypertension   . PAF (paroxysmal atrial fibrillation) ([place])    Past Surgical History:  Past Surgical History:  Procedure Laterality Date  . BACK SURGERY    . BREAST BIOPSY Right   . BREAST RECONSTRUCTION Right   . CATARACT EXTRACTION W/ INTRAOCULAR LENS  IMPLANT, BILATERAL Bilateral   . EXCISIONAL HEMORRHOIDECTOMY    . [place]; 1973; 1985   "ruptured discs each time"  . MASTECTOMY Right    cancer  . PLACEMENT OF BREAST IMPLANTS Bilateral    "had to take tissue out of left"  . TONSILLECTOMY    . VAGINAL HYSTERECTOMY     HPI:  82 y.o. female with medical history significant of hypertension, hyperlipidemia, depression, PAF and not on anticoagulants, breast cancer (s/p of right mastectomy, implant), bipolar disorder, chronic back pain, CKD-3, possible dementia, who presented with worsening mental status and fall. Pt fell on her right side and had right side knee pain. MRI and CT negative, concern for Parkinsons due to tremor per MD.    Assessment / Plan / Recommendation Clinical Impression  Pt demonstrates lethargy, poor cognition. Upon SLP arrival pt choking on her eggs with grandson at bedside assisting with meal. SLP assited pt and grandson, repositioned pt. Pt tolerated thin liquids in small sips in further trials, but became too sleepy to continue meal. Family reports she has been declining over the past few days/weeks, sleeping up to 20 hours a day, not eating or drinking well, though no  coughing or choking observed. Recommend pt continue with diet when alert, getting pt to chair to maximize arousal. Will soften solids to dys 2/thin liquids, follow for tolerance.      Aspiration Risk  Moderate aspiration risk    Diet Recommendation Dysphagia 2 (Fine chop);Thin liquid   Liquid Administration via: Cup Medication Administration: Crushed with puree Supervision: Staff to assist with self feeding;Full supervision/cueing for compensatory strategies Compensations: Slow rate;Small sips/bites Postural Changes: Seated upright at 90 degrees    Other  Recommendations Oral Care Recommendations: Oral care BID   Follow up Recommendations Skilled Nursing facility      Frequency and Duration min 2x/week  2 weeks       Prognosis        Swallow Study   General HPI: 82 y.o. female with medical history significant of hypertension, hyperlipidemia, depression, PAF and not on anticoagulants, breast cancer (s/p of right mastectomy, implant), bipolar disorder, chronic back pain, CKD-3, possible dementia, who presented with worsening mental status and fall. Pt fell on her right side and had right side knee pain. MRI and CT negative, concern for Parkinsons due to tremor per MD.  Type of Study: Bedside Swallow Evaluation Previous Swallow Assessment: none Diet Prior to this Study: Regular;Thin liquids Temperature Spikes Noted: No Respiratory Status: Room air History of Recent Intubation: No Behavior/Cognition: Lethargic/Drowsy Oral Care Completed by SLP: No Oral Cavity - Dentition: Adequate natural dentition Vision: Functional for self-feeding Self-Feeding Abilities: Needs assist Patient Positioning: Upright  in bed Baseline Vocal Quality: Low vocal intensity Volitional Cough: Cognitively unable to elicit Volitional Swallow: Able to elicit    Oral/Motor/Sensory Function Overall Oral Motor/Sensory Function:  (will not follow commands)   Ice Chips     Thin Liquid Thin Liquid: Within  functional limits Presentation: Cup    Nectar Thick Nectar Thick Liquid: Not tested   Honey Thick Honey Thick Liquid: Not tested   Puree Puree: Within functional limits Presentation: Spoon   Solid   GO   Solid: Impaired Oral Phase Functional Implications: Oral residue Pharyngeal Phase Impairments: Cough - Immediate       Tracy Baltimore, MA CCC-SLP [phone]  Tracy Rodriguez, Tracy Rodriguez 02/09/2016,1:16 PM

## 2016-02-09 NOTE — Evaluation (Signed)
Occupational Therapy Evaluation Patient Details Name: Tracy Rodriguez MRN: [id number] DOB: 02-Oct-1931 Today's Date: 02/09/2016    History of Present Illness 82 y.o. female with medical history significant of hypertension, hyperlipidemia, depression, PAF and not on anticoagulants, breast cancer (s/p of right mastectomy, implant), bipolar disorder, chronic back pain, CKD-3, possible dementia, who presented with worsening mental status and fall. Pt fell on her right side and had right side knee pain.    Clinical Impression   Per pts daughter, pt has required increased assist with ADL recently (over the past ~2 months) prior to that she was independent with BADL. Currently pt requires min assist +2 for safety with basic transfers and max assist overall for ADL. Pt very lethargic this session, intermittently alert and following commands but quick to fall back asleep. Daughter reports pt has been sleeping 20-22 hours per day recently. Recommending SNF for follow up to maximize independence and safety prior to return home. Pt would benefit from continued skilled OT to address established goals.    Follow Up Recommendations  SNF;Supervision/Assistance - 24 hour    Equipment Recommendations  Other (comment) (TBD at next venue)    Recommendations for Other Services       Precautions / Restrictions Precautions Precautions: Fall Precaution Comments: h/o multiple falls Restrictions Weight Bearing Restrictions: No      Mobility Bed Mobility Overal bed mobility: Needs Assistance Bed Mobility: Sidelying to Sit   Sidelying to sit: Min assist Supine to sit: Min assist;+2 for safety/equipment     General bed mobility comments: min A for R sidelying to sit  Transfers Overall transfer level: Needs assistance Equipment used: Rolling walker (2 wheeled) Transfers: Sit to/from [facility] Sit to Stand: Min assist;+2 safety/equipment Stand pivot transfers: Min assist;+2  safety/equipment       General transfer comment: min A to rise, min A to maneuver RW with pivot to recliner, shuffling steps with SPT    Balance Overall balance assessment: Needs assistance Sitting-balance support: Feet supported;Bilateral upper extremity supported Sitting balance-Leahy Scale: Fair     Standing balance support: Bilateral upper extremity supported Standing balance-Leahy Scale: Poor Standing balance comment: RW for support                            ADL Overall ADL's : Needs assistance/impaired     Grooming: Maximal assistance;Sitting   Upper Body Bathing: Maximal assistance;Sitting   Lower Body Bathing: Maximal assistance;Sit to/from stand   Upper Body Dressing : Maximal assistance;Sitting   Lower Body Dressing: Maximal assistance;Sit to/from stand Lower Body Dressing Details (indicate cue type and reason): to don socks Toilet Transfer: Minimal assistance;+2 for safety/equipment;Stand-pivot;BSC;RW;Cueing for sequencing Toilet Transfer Details (indicate cue type and reason): Simulated by stand pivot from EOB to chair         Functional mobility during ADLs: Minimal assistance;+2 for safety/equipment;Rolling walker (for stand pivot only) General ADL Comments: Pts daughter reports pt has been sleeping 20-22 hours per day. Intermittently alert and following commands and responding to questions then falling back asleep. Focused on having to urinate despite explaining catheter placement.     Vision Additional Comments: Difficult to assess due to impaired cognition. Reports sock as "glove"   Perception     Praxis      Pertinent Vitals/Pain Pain Assessment: Faces Faces Pain Scale: Hurts little more Pain Location: R knee with movement Pain Descriptors / Indicators: Grimacing Pain Intervention(s): Limited activity within patient's tolerance;Monitored during session;Repositioned  Hand Dominance     Extremity/Trunk Assessment Upper  Extremity Assessment Upper Extremity Assessment: Generalized weakness   Lower Extremity Assessment Lower Extremity Assessment: Defer to PT evaluation   Cervical / Trunk Assessment Cervical / Trunk Assessment: Normal   Communication Communication Communication: No difficulties   Cognition Arousal/Alertness: Lethargic Behavior During Therapy: Flat affect Overall Cognitive Status: Impaired/Different from baseline Area of Impairment: Following commands;Problem solving     Memory: Decreased short-term memory Following Commands: Follows one step commands inconsistently     Problem Solving: Decreased initiation;Requires verbal cues;Requires tactile cues General Comments: varying levels of alertness, pt would briefly open eyes and respond appropriatly to questions/commands, then fall back asleep, family states her mental status has declined recently   General Comments       Exercises       Shoulder Instructions      Home Living Family/patient expects to be discharged to:: [place]: [place]ford Rile - 2 wheels;Walker - 4 wheels   Additional Comments: from [facility]      Prior Functioning/Environment Level of Independence: Needs assistance  Gait / Transfers Assistance Needed: used rollator to walk to dining room up until 2 months ago ADL's / Homemaking Assistance Needed: recently has needed assist for ADL.   Comments: multiple falls, for 2 months has slept for 20-22 hrs/day, recently she's needed an aide to assist with dressing, meals, meds, showering; significant decline 2 weeks ago        OT Problem List: Decreased strength;Decreased activity tolerance;Impaired balance (sitting and/or standing);Decreased coordination;Decreased safety awareness;Decreased cognition;Decreased knowledge of use of DME or AE;Decreased knowledge of precautions;Pain   OT Treatment/Interventions: Self-care/ADL training;Therapeutic exercise;Energy  conservation;DME and/or AE instruction;Therapeutic activities;Patient/family education;Balance training;Cognitive remediation/compensation    OT Goals(Current goals can be found in the care plan section) Acute Rehab OT Goals Patient Stated Goal: family would like SNF with hopeful return to [facility]  OT Goal Formulation: With family Time For Goal Achievement: 02/23/16 Potential to Achieve Goals: Fair ADL Goals Pt Will Perform Grooming: with min guard assist;standing Pt Will Perform Upper Body Bathing: sitting;with supervision Pt Will Perform Lower Body Bathing: with min guard assist;sit to/from stand Pt Will Transfer to Toilet: with min assist;ambulating;bedside commode Pt Will Perform Toileting - Clothing Manipulation and hygiene: with min guard assist;sit to/from stand  OT Frequency: Min 2X/week   Barriers to D/C:            Co-evaluation PT/OT/SLP Co-Evaluation/Treatment: Yes Reason for Co-Treatment: Necessary to address cognition/behavior during functional activity;For patient/therapist safety PT goals addressed during session: Mobility/safety with mobility;Proper use of DME;Balance OT goals addressed during session: ADL's and self-care      End of Session Equipment Utilized During Treatment: Gait belt;Rolling walker  Activity Tolerance: Patient limited by lethargy;Patient tolerated treatment well Patient left: in chair;with call bell/phone within reach;with chair alarm set;with family/visitor present   Time: 1333-1400 OT Time Calculation (min): 27 min Charges:  OT General Charges $OT Visit: 1 Procedure OT Evaluation $OT Eval Moderate Complexity: 1 Procedure G-Codes:     Binnie Kand M.S., OTR/L PagerJN:[phone]  02/09/2016, 3:02 PM

## 2016-02-09 NOTE — NC FL2 (Signed)
MEDICAID FL2 LEVEL OF CARE SCREENING TOOL     IDENTIFICATION  Patient Name: Tracy Rodriguez Birthdate: Oct 25, 1931 Sex: female Admission Date (Current Location): 02/07/2016  [facility] [place] Number:  Herbalist and Address:  [facility] [place] [address], [place], [place] [address]      Provider Number: [id number]  Attending Physician Name and Address:  Theodis Blaze, MD  Relative Name and Phone Number:  Warren Lacy, daughter, [phone]    Current Level of Care: Hospital Recommended Level of Care: [place] Prior Approval Number:    Date Approved/Denied:   PASRR Number:    Discharge Plan: SNF    Current Diagnoses: Patient Active Problem List   Diagnosis Date Noted  . Acute renal failure superimposed on stage 3 chronic kidney disease ([place]) 02/08/2016  . Acute encephalopathy 02/07/2016  . Elevated troponin 02/07/2016  . Tremor of right hand 02/07/2016  . Delirium   . Fall   . Right knee injury   . Syncope 12/05/2015  . Physical deconditioning 12/05/2015  . Acute kidney injury ([place]) 12/05/2015  . Dehydration, moderate 12/05/2015  . Acute hypokalemia 12/05/2015  . Abnormal chest x-ray 12/05/2015  . Encounter for therapeutic drug monitoring 12/22/2014  . Atrial fibrillation with RVR ([place]) 08/04/2014  . Chronic atrial fibrillation ([place]) 08/04/2014  . Hypertensive urgency   . Obesity 09/27/2013  . Chronic back pain 09/14/2012  . Bipolar disorder ([place]) 09/14/2012    Orientation RESPIRATION BLADDER Height & Weight     Self  Normal Continent, Indwelling catheter Weight:   Height:     BEHAVIORAL SYMPTOMS/MOOD NEUROLOGICAL BOWEL NUTRITION STATUS      Continent Diet (Please see DC Summary)  AMBULATORY STATUS COMMUNICATION OF NEEDS Skin   Extensive Assist Verbally Normal                       Personal Care Assistance Level of Assistance  Bathing, Dressing, Feeding Bathing Assistance: Maximum  assistance Feeding assistance: Limited assistance Dressing Assistance: Limited assistance     Functional Limitations Info             SPECIAL CARE FACTORS FREQUENCY  PT (By licensed PT), OT (By licensed OT)     PT Frequency: 5x/week OT Frequency: 3x/week            Contractures Contractures Info: Not present    Additional Factors Info  Code Status, Allergies, Psychotropic Code Status Info: DNR Allergies Info: Codeine, Sulfa Antibiotics, Darvon Propoxyphene, Floxin Ofloxacin, Levsin Hyoscyamine Sulfate, Penicillins, Valium Diazepam, Zantac Ranitidine Hcl Psychotropic Info: Seroquel         Current Medications (02/09/2016):  This is the current hospital active medication list Current Facility-Administered Medications  Medication Dose Route Frequency Provider Last Rate Last Dose  . 0.9 %  sodium chloride infusion   Intravenous Continuous Ivor Costa, MD 75 mL/hr at 02/08/16 2027    . acetaminophen (TYLENOL) tablet 650 mg  650 mg Oral Q6H PRN Ivor Costa, MD       Or  . acetaminophen (TYLENOL) suppository 650 mg  650 mg Rectal Q6H PRN Ivor Costa, MD      . aspirin suppository 150 mg  150 mg Rectal Daily Ivor Costa, MD   150 mg at 02/09/16 0846  . atorvastatin (LIPITOR) tablet 40 mg  40 mg Oral q1800 Ivor Costa, MD   40 mg at 02/08/16 1853  . carvedilol (COREG) tablet 3.125 mg  3.125 mg Oral BID  WC Ivor Costa, MD   3.125 mg at 02/09/16 0846  . digoxin (LANOXIN) tablet 125 mcg  125 mcg Oral Daily Ivor Costa, MD   125 mcg at 02/09/16 0845  . diltiazem (CARDIZEM CD) 24 hr capsule 120 mg  120 mg Oral Daily Ivor Costa, MD   120 mg at 02/09/16 0846  . enoxaparin (LOVENOX) injection 40 mg  40 mg Subcutaneous Daily Ivor Costa, MD   40 mg at 02/09/16 0845  . escitalopram (LEXAPRO) tablet 10 mg  10 mg Oral Daily Ivor Costa, MD   10 mg at 02/09/16 0845  . gabapentin (NEURONTIN) capsule 300 mg  300 mg Oral BID Ivor Costa, MD   300 mg at 02/09/16 0846  . hydrALAZINE (APRESOLINE) injection 10 mg  10  mg Intravenous Q2H PRN Theodis Blaze, MD   10 mg at 02/09/16 0412  . hydrALAZINE (APRESOLINE) tablet 10 mg  10 mg Oral Q8H Theodis Blaze, MD   10 mg at 02/09/16 1507  . mirabegron ER (MYRBETRIQ) tablet 25 mg  25 mg Oral Daily Ivor Costa, MD   25 mg at 02/09/16 0844  . nitroGLYCERIN (NITROSTAT) SL tablet 0.4 mg  0.4 mg Sublingual Q5 min PRN Ivor Costa, MD      . QUEtiapine (SEROQUEL) tablet 25 mg  25 mg Oral BID Ivor Costa, MD   25 mg at 02/09/16 0846  . sodium chloride flush (NS) 0.9 % injection 3 mL  3 mL Intravenous Q12H Ivor Costa, MD   3 mL at 02/09/16 0847  . traMADol (ULTRAM) tablet 50 mg  50 mg Oral Q6H PRN Ivor Costa, MD   50 mg at 02/08/16 1143  . traZODone (DESYREL) tablet 150 mg  150 mg Oral QHS Ivor Costa, MD   150 mg at 02/08/16 2210     Discharge Medications: Please see discharge summary for a list of discharge medications.  Relevant Imaging Results:  Relevant Lab Results:   Additional Information SSN: [place], [place]

## 2016-02-09 NOTE — Care Management Note (Addendum)
Case Management Note  Patient Details  Name: Darline Divita MRN: [id number] Date of Birth: 1931/12/26  Subjective/Objective:                 Patient admitted from [facility] for hypertensive urgency. CSW consult placed.    Action/Plan:  CM will continue to follow and assist CSW in discharge planning. 02-12-16 Anticipate DC to [facility] SNF today.   Expected Discharge Date:                  Expected Discharge Plan:  Assisted Living / Rest Home ([facility])  In-House Referral:  Clinical Social Work  Discharge planning Services  CM Consult  Post Acute Care Choice:    Choice offered to:     DME Arranged:    DME Agency:     HH Arranged:    [place] Agency:     Status of Service:  In process, will continue to follow  If discussed at Long Length of Stay Meetings, dates discussed:    Additional Comments:  Carles Collet, RN 02/09/2016, 4:09 PM

## 2016-02-09 NOTE — Clinical Social Work Note (Signed)
Clinical Social Work Assessment  Patient Details  Name: Tracy Rodriguez MRN: [id number] Date of Birth: May 30, 1931  Date of referral:  02/09/16               Reason for consult:  Facility Placement                Permission sought to share information with:  Facility Sport and exercise psychologist, Family Supports Permission granted to share information::  No (Disoriented)  Name::     Tracy Rodriguez  Agency::  SNFs  Relationship::  Daughter  Contact Information:  [phone]  Housing/Transportation Living arrangements for the past 2 months:  Apartment Source of Information:  Adult Children Patient Interpreter Needed:  None Criminal Activity/Legal Involvement Pertinent to Current Situation/Hospitalization:  No - Comment as needed Significant Relationships:  Adult Children Lives with:  Self Do you feel safe going back to the place where you live?  No Need for family participation in patient care:  Yes (Comment)  Care giving concerns:  CSW received consult for possible SNF placement at time of discharge. Patient is disoriented. CSW met with patient and patient's daughter, Tracy Rodriguez, at bedside regarding PT recommendation of SNF placement at time of discharge. Per patient's daughter, patient is unable to walk without help and lives alone at [facility]. Patient's daughter expressed understanding of PT recommendation and is agreeable to SNF placement at time of discharge. CSW to continue to follow and assist with discharge planning needs.   Social Worker assessment / plan:  CSW spoke with patient's daughter concerning possibility of rehab at [facility] before returning home.  Employment status:  Retired Forensic scientist:  Medicare PT Recommendations:  [place] / Referral to community resources:  [place]  Patient/Family's Response to care:  Patient's daughters recognize need for rehab before returning home and are agreeable to a SNF in [place].  They reported preference for [facility].  Patient/Family's Understanding of and Emotional Response to Diagnosis, Current Treatment, and Prognosis:  Patient/family is realistic regarding therapy needs and expressed being hopeful for SNF placement. Patient expressed understanding of CSW role and discharge process. No questions/concerns about plan or treatment.    Emotional Assessment Appearance:  Appears stated age Attitude/Demeanor/Rapport:  Unable to Assess Affect (typically observed):  Unable to Assess Orientation:  Oriented to Self Alcohol / Substance use:  Not Applicable Psych involvement (Current and /or in the community):  No (Comment)  Discharge Needs  Concerns to be addressed:  Care Coordination Readmission within the last 30 days:  No Current discharge risk:  None Barriers to Discharge:  Continued Medical Work up   [employer], [place] 02/09/2016, 5:02 PM

## 2016-02-10 DIAGNOSIS — F0280 Dementia in other diseases classified elsewhere without behavioral disturbance: Secondary | ICD-10-CM

## 2016-02-10 DIAGNOSIS — G301 Alzheimer's disease with late onset: Secondary | ICD-10-CM

## 2016-02-10 LAB — BASIC METABOLIC PANEL
Anion gap: 9 (ref 5–15)
BUN: 12 mg/dL (ref 6–20)
CO2: 23 mmol/L (ref 22–32)
Calcium: 8.5 mg/dL — ABNORMAL LOW (ref 8.9–10.3)
Chloride: 110 mmol/L (ref 101–111)
Creatinine, Ser: 1.13 mg/dL — ABNORMAL HIGH (ref 0.44–1.00)
GFR calc Af Amer: 51 mL/min — ABNORMAL LOW (ref >60)
GFR calc non Af Amer: 44 mL/min — ABNORMAL LOW (ref >60)
Glucose, Bld: 86 mg/dL (ref 65–99)
Potassium: 3.6 mmol/L (ref 3.5–5.1)
Sodium: 142 mmol/L (ref 135–145)

## 2016-02-10 LAB — CBC
HCT: 35.2 % — ABNORMAL LOW (ref 36.0–46.0)
Hemoglobin: 11 g/dL — ABNORMAL LOW (ref 12.0–15.0)
MCH: 26.1 pg (ref 26.0–34.0)
MCHC: 31.3 g/dL (ref 30.0–36.0)
MCV: 83.4 fL (ref 78.0–100.0)
Platelets: 199 10*3/uL (ref 150–400)
RBC: 4.22 MIL/uL (ref 3.87–5.11)
RDW: 20.7 % — ABNORMAL HIGH (ref 11.5–15.5)
WBC: 6.4 10*3/uL (ref 4.0–10.5)

## 2016-02-10 LAB — GLUCOSE, CAPILLARY: Glucose-Capillary: 89 mg/dL (ref 65–99)

## 2016-02-10 MED ORDER — CARVEDILOL 6.25 MG PO TABS
6.2500 mg | ORAL_TABLET | Freq: Two times a day (BID) | ORAL | Status: DC
  Administered 2016-02-10 – 2016-02-12 (×5): 6.25 mg via ORAL
  Filled 2016-02-10 (×5): qty 1

## 2016-02-10 MED ORDER — HYDRALAZINE HCL 25 MG PO TABS
25.0000 mg | ORAL_TABLET | Freq: Three times a day (TID) | ORAL | Status: DC
  Administered 2016-02-10 – 2016-02-12 (×7): 25 mg via ORAL
  Filled 2016-02-10 (×7): qty 1

## 2016-02-10 NOTE — Progress Notes (Addendum)
Patient ID: Tracy Rodriguez, female   DOB: 1932/01/17, 82 y.o.   MRN: [id number]    PROGRESS NOTE    Tracy Rodriguez  [id number] DOB: 06-10-1931 DOA: 02/07/2016  PCP: [name], MD   Brief Narrative:   82 y.o. female with medical history significant of hypertension, hyperlipidemia, depression, PAF and not on anticoagulants, breast cancer (s/p of right mastectomy, implant), bipolar disorder, chronic back pain, CKD-3, possible dementia, who presented with worsening mental status and fall. Pt fell on her right side and had right side knee pain.   Assessment & Plan:   Hypertensive urgency - lood pressure 212/77-->205/87 - SBP in 170's this AM  - currently on Diltiazem, Coreg,  - added Hydralazine scheduled and as needed but I will increase the dose of Hydralazine from 10 mg TID to 25 mg TID - monitor on telemetry unit   Fall - Likely due to multifactorial etiology, including possible dementia, possible Parkinson's disease given resting hand tremor and generalized deconditioning.  - Pt has chronic A fib, but not on anticoagulants and has poor balance, at high risk of getting stroke.  - CT-head is negative for acute intracranial abnormalities.  - XRAY of the knee with no acute abnormalities  - continue when necessary tramadol for pain - MRI brain with no evidence of acute stroke  - PT eval done, SNF recommended, daughter in agreement  Acute encephalopathy - Possibly due to hypertensive encephalopathy - pt looks better this AM, daughter says pt closer to baseline - neurology consulted   Elevated troponin - Troponin 0.03. EKG has nonspecific T-wave change.  - Most likely due to demand ischemia secondary to hypertensive urgency. Patient denies chest pain - would not check troponins unless pt with chest pain   Hypokalemia - supplemented and WNL this AM - BMP In AM  Tremor of right hand:  - Unclear etiology, possible Parkinson's disease - neurology consulted  -  will likely need outpatient referral   Atrial Fibrillation - CHA2DS2-VASc Score is 4, needs oral anticoagulation, but pt is not on AC at home due to high risk of fall. Heart rate is well controlled. - continue Didgoxin -continue Dilt-Xr  Bipolar disorder and Depression - stable  - Continue home medications: Lexapro, Seroquel - if persistent agitation, can add ZYprexa per neurology recommendation  DVT prophylaxis: Lovenox SQ Code Status: DNR Family Communication: Patient and daughter at bedside  Disposition Plan: SNF likely by Monday  Consultants:  None  Procedures:   None  Antimicrobials:   None  Subjective: No events overnight.   Objective: Vitals:   02/10/16 0639 02/10/16 1008 02/10/16 1228 02/10/16 1515  BP: (!) 189/80 (!) 139/55 (!) 146/57 (!) 128/46  Pulse: 80 93 71   Resp: 20  18   Temp: 98.6 F (37 C)  98.4 F (36.9 C)   TempSrc: Oral  Oral   SpO2: 94%  95%     Intake/Output Summary (Last 24 hours) at 02/10/16 1823 Last data filed at 02/10/16 1529  Gross per 24 hour  Intake              120 ml  Output              200 ml  Net              -80 ml   There were no vitals filed for this visit.  Examination:  General exam: Appears alert, follows commands, NAD Respiratory system: Respiratory effort normal. Cardiovascular system: IRRR. No JVD, rubs, gallops  or clicks. No pedal edema. Gastrointestinal system: Abdomen is nondistended, soft and nontender. No organomegaly or masses felt.  Central nervous system: alert and oriented to name only, follows commands  Extremities: Symmetric 5 x 5 power.  Data Reviewed: I have personally reviewed following labs and imaging studies  CBC:  Recent Labs Lab 02/07/16 2021 02/08/16 0514 02/09/16 0357 02/10/16 0455  WBC 7.5 7.4 6.5 6.4  NEUTROABS 5.5  --   --   --   HGB 11.7* 11.8* 11.0* 11.0*  HCT 37.3 38.4 35.5* 35.2*  MCV 83.8 84.0 83.3 83.4  PLT 214 214 203 [id number]   Basic Metabolic Panel:  Recent  Labs Lab 02/07/16 2021 02/08/16 0514 02/09/16 0357 02/10/16 0455  NA 141 143 142 142  K 3.7 3.4* 3.7 3.6  CL 106 107 107 110  CO2 27 27 25 23   GLUCOSE 109* 103* 99 86  BUN 11 11 10 12   CREATININE 1.21* 0.95 1.08* 1.13*  CALCIUM 9.1 8.9 8.8* 8.5*   Liver Function Tests:  Recent Labs Lab 02/07/16 2021  AST 25  ALT 17  ALKPHOS 59  BILITOT 0.5  PROT 6.8  ALBUMIN 3.7   Coagulation Profile:  Recent Labs Lab 02/07/16 2021  INR 1.19   Cardiac Enzymes:  Recent Labs Lab 02/07/16 2021 02/07/16 2247 02/08/16 0514 02/08/16 1120  CKTOTAL  --  74  --   --   TROPONINI 0.03* 0.03* 0.04* 0.05*   Lipid Profile:  Recent Labs  02/08/16 0514  CHOL 172  HDL 52  LDLCALC 106*  TRIG 68  CHOLHDL 3.3   Urine analysis:    Component Value Date/Time   COLORURINE YELLOW 02/08/2016 0300   APPEARANCEUR CLEAR 02/08/2016 0300   LABSPEC 1.008 02/08/2016 0300   PHURINE 7.5 02/08/2016 0300   GLUCOSEU NEGATIVE 02/08/2016 0300   HGBUR NEGATIVE 02/08/2016 0300   BILIRUBINUR NEGATIVE 02/08/2016 0300   BILIRUBINUR n 07/03/2015 1457   KETONESUR NEGATIVE 02/08/2016 0300   PROTEINUR NEGATIVE 02/08/2016 0300   UROBILINOGEN 1.0 07/03/2015 1457   UROBILINOGEN 0.2 08/04/2014 0657   NITRITE NEGATIVE 02/08/2016 0300   LEUKOCYTESUR NEGATIVE 02/08/2016 0300   Radiology Studies: No results found.    Scheduled Meds: . aspirin  150 mg Rectal Daily  . atorvastatin  40 mg Oral q1800  . carvedilol  6.25 mg Oral BID WC  . digoxin  125 mcg Oral Daily  . diltiazem  120 mg Oral Daily  . enoxaparin (LOVENOX) injection  40 mg Subcutaneous Daily  . escitalopram  10 mg Oral Daily  . gabapentin  300 mg Oral BID  . hydrALAZINE  25 mg Oral Q8H  . mirabegron ER  25 mg Oral Daily  . QUEtiapine  25 mg Oral BID  . sodium chloride flush  3 mL Intravenous Q12H  . traZODone  150 mg Oral QHS   Continuous Infusions: . sodium chloride 50 mL/hr at 02/09/16 2226     LOS: 3 days   Time spent: 20  minutes   Faye Ramsay, MD [facility] Pager [phone]  If 7PM-7AM, please contact night-coverage [email or web address] Password [facility] 02/10/2016, 6:23 PM

## 2016-02-10 NOTE — Consult Note (Addendum)
Neurology Consult Note  Reason for Consultation: Persistent altered mental status  Requesting provider: Mart Piggs, MD  CC: "I fell"  HPI: This is an 4-yo woman who presented to the ED on 02/07/16 with altered mental status. She was sent from her nursing facility after she was noted to have decreased interactivity and anorexia. She was noted to be largely nonverbal in the ED but would occasionally offer appropriate responses. Her daughter reported that there has been a notable decline in the patient's function over the past month with a more abrupt change over the two days before her admission when she became less interactive, weaker, unwilling to stand, not eating or drinking. In the ED she was found to be severely hypertensive with BP 212/173 at its highest. On exam she was noted to appear ill with atrophy and tremor, inconsistent verbal responses, and inability to follow commands reliably. Labs revealed elevated troponin. She was admitted for treatment of presumed hypertensive urgency.   On chart review, there is mention of possible dementia in her records. Her daughter indicated to the admitting hospitalist that the "patient may have dementia, but never diagnosed with dementia." Her daughter reported that her mental status had been declining for six weeks. She also reported poor balance and frequent falls recently. She was noted to have a tremor in her right hand raising speculation about possible parkinson's disease. Neurology consultation is now requested for further evaluation and recommendations.   Her daughter is present at the bedside and offers additional information. She states that the first change that they noticed was a personality change that occurred about ten years ago. She says that the patient was formerly very authoritative and demanding but gradually became more nurturing and loving. At the time the patient was living with her husband in [place], [place], and her daughter lived  elsewhere so she did not see the patient regularly. However, her father would tell her about some of the things that the patient did, including one incident when the patient was going to drive to church but ended up getting on [address] and driving to [place] instead. They had been going to the same church for more than 50 years and it was located near their house. When their father got sick in 2014, she stayed with them for a couple of months and noted that her mother was forgetful. After her father died, she was struck by how many of the patient's friends came up to her to ask what she was going to do with the patient because it was clear that she was not able to live be herself.   They brought the patient here to [facility] where another daughter lives in 2014. Her daughter feels that she has never been happy here and that she has been more depressed since the move. She has a life-long history of depression and has been treated for this, but this seemed to get worse following the move. She has been staying in the independent-living section of an assisted living facility. Her daughter reports that they have noted a gradual decline in her memory that became more pronounced over the past six months. Over the past couple of months she states that the patient tends to stay in bed 22 hours out of every day and anytime someone walks into her apartment they find her curled up in bed. Over the past couple of weeks she has had more word-finding difficulty and has developed some difficulty walking with frequent falls. Her daughter states that the  family has known for some time that the patient has dementia but she says this has never been formally diagnosed and that this hospitalization is the first time any doctors have mentioned dementia in her mother. They are hopeful that the patient will regain ambulation so that she can continue to live in her apartment.   The patient is unable to provide any detailed history at this  time.   PMH:  Past Medical History:  Diagnosis Date  . Arthritis    "joints" (08/04/2014)  . Bipolar 1 disorder ([place])   . Cancer of right breast ([place])   . Chronic lower back pain   . Hyperlipemia   . Hypertension   . PAF (paroxysmal atrial fibrillation) ([facility])     PSH:  Past Surgical History:  Procedure Laterality Date  . BACK SURGERY    . BREAST BIOPSY Right   . BREAST RECONSTRUCTION Right   . CATARACT EXTRACTION W/ INTRAOCULAR LENS  IMPLANT, BILATERAL Bilateral   . EXCISIONAL HEMORRHOIDECTOMY    . [place]; 1973; 1985   "ruptured discs each time"  . MASTECTOMY Right    cancer  . PLACEMENT OF BREAST IMPLANTS Bilateral    "had to take tissue out of left"  . TONSILLECTOMY    . VAGINAL HYSTERECTOMY      Family history: Family History  Problem Relation Age of Onset  . Stroke Mother   . Heart disease Father   . Alzheimer's disease Sister   . Leukemia Brother   . Arthritis Maternal Grandmother   . Breast cancer Daughter     Social history:  Social History   Social History  . Marital status: Widowed    Spouse name: N/A  . Number of children: N/A  . Years of education: N/A   Occupational History  . Not on file.   Social History Main Topics  . Smoking status: Never Smoker  . Smokeless tobacco: Never Used  . Alcohol use No  . Drug use: No  . Sexual activity: No   Other Topics Concern  . Not on file   Social History Narrative  . No narrative on file    Current outpatient meds: Current Meds  Medication Sig  . digoxin (LANOXIN) 0.125 MG tablet Take 1 tablet (125 mcg total) by mouth daily.  [place] Kitchen DILT-XR 120 MG 24 hr capsule Take 120 mg by mouth daily.  Mar[place] Kitchen gabapentin (NEURONTIN) 300 MG capsule Take 1 capsule (300 mg total) by mouth 2 (two) times daily.  . mirabegron ER (MYRBETRIQ) 25 MG TB24 tablet Take 1 tablet (25 mg total) by mouth daily.  . QUEtiapine (SEROQUEL) 25 MG tablet Take 1 tablet (25 mg total) by mouth 2 (two) times daily.  .  traMADol (ULTRAM) 50 MG tablet Take 1 tablet (50 mg total) by mouth every 6 (six) hours as needed. (Patient taking differently: Take 50 mg by mouth every 6 (six) hours as needed for moderate pain. )  . traZODone (DESYREL) 150 MG tablet TAKE 1 TABLET BY MOUTH EVERY NIGHT AT BEDTIME (Patient taking differently: Take 150 mg by mouth at bedtime)  . [DISCONTINUED] escitalopram (LEXAPRO) 10 MG tablet TAKE 1 TABLET(10 MG) BY MOUTH DAILY    Current inpatient meds:  Current Facility-Administered Medications  Medication Dose Route Frequency Provider Last Rate Last Dose  . 0.9 %  sodium chloride infusion   Intravenous Continuous Theodis Blaze, MD 50 mL/hr at 02/09/16 2226    . acetaminophen (TYLENOL) tablet 650 mg  650  mg Oral Q6H PRN Ivor Costa, MD       Or  . acetaminophen (TYLENOL) suppository 650 mg  650 mg Rectal Q6H PRN Ivor Costa, MD      . aspirin suppository 150 mg  150 mg Rectal Daily Ivor Costa, MD   150 mg at 02/09/16 0846  . atorvastatin (LIPITOR) tablet 40 mg  40 mg Oral q1800 Ivor Costa, MD   40 mg at 02/08/16 1853  . carvedilol (COREG) tablet 6.25 mg  6.25 mg Oral BID WC Theodis Blaze, MD      . digoxin [facility]) tablet 125 mcg  125 mcg Oral Daily Ivor Costa, MD   125 mcg at 02/09/16 0845  . diltiazem (CARDIZEM CD) 24 hr capsule 120 mg  120 mg Oral Daily Ivor Costa, MD   120 mg at 02/09/16 0846  . enoxaparin (LOVENOX) injection 40 mg  40 mg Subcutaneous Daily Ivor Costa, MD   40 mg at 02/09/16 0845  . escitalopram (LEXAPRO) tablet 10 mg  10 mg Oral Daily Ivor Costa, MD   10 mg at 02/09/16 0845  . gabapentin (NEURONTIN) capsule 300 mg  300 mg Oral BID Ivor Costa, MD   300 mg at 02/09/16 2224  . hydrALAZINE (APRESOLINE) injection 10 mg  10 mg Intravenous Q2H PRN Theodis Blaze, MD   10 mg at 02/10/16 [id number]  . hydrALAZINE (APRESOLINE) tablet 25 mg  25 mg Oral Q8H Theodis Blaze, MD      . mirabegron ER [facility]) tablet 25 mg  25 mg Oral Daily Ivor Costa, MD   25 mg at 02/09/16 0844  . nitroGLYCERIN  (NITROSTAT) SL tablet 0.4 mg  0.4 mg Sublingual Q5 min PRN Ivor Costa, MD      . QUEtiapine (SEROQUEL) tablet 25 mg  25 mg Oral BID Ivor Costa, MD   25 mg at 02/09/16 2225  . sodium chloride flush (NS) 0.9 % injection 3 mL  3 mL Intravenous Q12H Ivor Costa, MD   3 mL at 02/09/16 0847  . traMADol (ULTRAM) tablet 50 mg  50 mg Oral Q6H PRN Ivor Costa, MD   50 mg at 02/09/16 2225  . traZODone (DESYREL) tablet 150 mg  150 mg Oral QHS Ivor Costa, MD   150 mg at 02/09/16 2224    Allergies: Allergies  Allergen Reactions  . Codeine Anaphylaxis  . Sulfa Antibiotics Rash  . Darvon [Propoxyphene] Other (See Comments)  . Floxin [Ofloxacin] Other (See Comments)  . Levsin [Hyoscyamine Sulfate] Other (See Comments)  . Penicillins     Has patient had a PCN reaction causing immediate rash, facial/tongue/throat swelling, SOB or lightheadedness with hypotension: Yes Has patient had a PCN reaction causing severe rash involving mucus membranes or skin necrosis: No Has patient had a PCN reaction that required hospitalization No Has patient had a PCN reaction occurring within the last 10 years: No If all of the above answers are "NO", then may proceed with Cephalosporin use.   . Valium [Diazepam] Other (See Comments)  . Zantac [Ranitidine Hcl] Other (See Comments)    ROS: As per HPI. A full 14-point review of systems was performed and is otherwise notable for some pain in her back and R knee. It is otherwise unremarkable.   PE:  BP (!) 189/80 (BP Location: Left Arm)   Pulse 80   Temp 98.6 F (37 C) (Oral)   Resp 20   LMP 10/10/1969   SpO2 94%   General: WDWN sitting up in  bed. She is in no acute distress. She is alert, oriented to self, [facility]ac[facility] Pontes, 2017. She is able to spell "world" forward but not backwards. She cannot give me the months in reverse order; she startes wit December and then starts trying to spell "world" backwards again. Speech is empty but without dysarthria. No overt aphasia is noted.   Affect is flat. Comportment is normal.  HEENT: Normocephalic. Neck supple without LAD. MMM, OP clear. Dentition good. Sclerae anicteric. No conjunctival injection.  CV: Regular, no murmur. Carotid pulses full and symmetric, no bruits. Distal pulses 2+ and symmetric.  Lungs: CTAB.  Abdomen: Soft, non-distended, non-tender. Bowel sounds present x4.  Extremities: No C/C/E. She has scattered ecchymoses over both legs and arms.  Neuro:  CN: Pupils are equal and round. They are symmetrically reactive from 3-->2 mm. Visual fields are full to confrontation. EOMs are notable for breakup of smooth pursuits without nystagmus. No reported diplopia. Facial sensation is intact to light touch. Face is symmetric at rest with normal strength and mobility. Hearing is intact to conversational voice. Palate elevates symmetrically and uvula is midline. Voice is normal in tone, pitch and quality. Bilateral SCM and trapezii are 5/5. Tongue is midline with normal bulk and mobility.  Motor: Normal bulk, tone, and strength with the exception of mild proximal weakness t/o, legs weaker than arms. Effort is somewhat variable. She has an intermittent irregular large amplitude tremor in both hands--this is asychronous and moves from one hand to the other and seems behavioral in nature. No other abnormal movements. No drift.  Sensation: Intact to light touch.  DTRs: 2+, symmetric. Toes downgoing bilaterally. Several frontal release signs are present including bilateral palmomentals, bilateral grasps, and a snout.   Coordination: Finger-to-nose is limited by inability to maintain pattern and follow instruction. Finger taps are slow but symmetric, no decrement.   Labs:  Lab Results  Component Value Date   WBC 6.4 02/10/2016   HGB 11.0 (L) 02/10/2016   HCT 35.2 (L) 02/10/2016   PLT 199 02/10/2016   GLUCOSE 86 02/10/2016   CHOL 172 02/08/2016   TRIG 68 02/08/2016   HDL 52 02/08/2016   LDLCALC 106 (H) 02/08/2016   ALT 17  02/07/2016   AST 25 02/07/2016   NA 142 02/10/2016   K 3.6 02/10/2016   CL 110 02/10/2016   CREATININE 1.13 (H) 02/10/2016   BUN 12 02/10/2016   CO2 23 02/10/2016   TSH 1.615 12/05/2015   INR 1.19 02/07/2016   HGBA1C 5.2 02/08/2016   UA 11/30 negative UDS negative   Imaging:  I have personally and independently reviewed the MRI brain without contrast from 02/08/16. This shows mild to moderate diffuse generalized atrophy with greater focal atrophy in the parietal lobes bilaterally. There is a mild degree of chronic small vessel ischemic disease in the bihemispheric white matter that is largely periventricular in location. No acute ischemia or other acute pathology is noted.   Assessment and Plan: 1. Acute delirium: Her current increase in confusion represents a delirium, likely related to her severe HTN on presentation. Her daughter feels like this is improving. This is superimposed upon a background dementia. Continue to optimize metabolic status and treat blood pressure as you are. Avoid CNS active medications, including benzos, opiates, and anything with strong anticholinergic properties. Try to optimize circadian rhythms, keeping the room bright and active during the day with room quiet and dark with limited sleep interruptions to provide care.   2. Probable Alzheimer's dementia: Although  a formal diagnosis cannot be made in the setting of acute delirium, history as reported by the patient's daughter is suggestive of AD. This sounds like it has likely been underway for at least 10 years and has been slowly worsening with acceleration in decline over the past six months. This recent fast decline may simply be due to progression of her AD. Relatively greater atrophy noted in the parietal lobes on imaging would support AD as well. However, with her history of bipolar and depression, these need to be excluded as they can exaggerate cognitive decline. I would recommend formal neuropsychological  testing at some point but this should wait for 3-4 months after an acute delirium to be sure that this has resolved and is not clouding the picture. If depression is felt to be playing a role this should be aggressively treated. If this is her dementia, then could consider a cholinesterase inhibitor or memantine. If her decline continues, then hospice referral may be a reasonable consideration as well. She would benefit from outpatient neurology follow up.   This was discussed with the patient's daughter at the bedside. She is in agreement with the plan as noted. She was given the opportunity to ask any questions and these were addressed to her satisfaction.   Thank you for this consultation. I have no additional recs at this time and will sign off. Please call if any new issues arise.   A total of 85 minutes was spent on this consultation, greater than 50% of which was direct face-to-face time with the patient and her daughter.

## 2016-02-11 LAB — BASIC METABOLIC PANEL
Anion gap: 8 (ref 5–15)
BUN: 15 mg/dL (ref 6–20)
CO2: 23 mmol/L (ref 22–32)
Calcium: 8.5 mg/dL — ABNORMAL LOW (ref 8.9–10.3)
Chloride: 111 mmol/L (ref 101–111)
Creatinine, Ser: 1.2 mg/dL — ABNORMAL HIGH (ref 0.44–1.00)
GFR calc Af Amer: 47 mL/min — ABNORMAL LOW (ref >60)
GFR calc non Af Amer: 41 mL/min — ABNORMAL LOW (ref >60)
Glucose, Bld: 107 mg/dL — ABNORMAL HIGH (ref 65–99)
Potassium: 3.5 mmol/L (ref 3.5–5.1)
Sodium: 142 mmol/L (ref 135–145)

## 2016-02-11 LAB — CBC
HCT: 34.7 % — ABNORMAL LOW (ref 36.0–46.0)
Hemoglobin: 10.7 g/dL — ABNORMAL LOW (ref 12.0–15.0)
MCH: 25.6 pg — ABNORMAL LOW (ref 26.0–34.0)
MCHC: 30.8 g/dL (ref 30.0–36.0)
MCV: 83 fL (ref 78.0–100.0)
Platelets: 186 10*3/uL (ref 150–400)
RBC: 4.18 MIL/uL (ref 3.87–5.11)
RDW: 20.8 % — ABNORMAL HIGH (ref 11.5–15.5)
WBC: 6.7 10*3/uL (ref 4.0–10.5)

## 2016-02-11 LAB — GLUCOSE, CAPILLARY: Glucose-Capillary: 107 mg/dL — ABNORMAL HIGH (ref 65–99)

## 2016-02-11 MED ORDER — BISACODYL 5 MG PO TBEC
10.0000 mg | DELAYED_RELEASE_TABLET | Freq: Every day | ORAL | Status: DC | PRN
  Administered 2016-02-11: 10 mg via ORAL
  Filled 2016-02-11: qty 2

## 2016-02-11 NOTE — Progress Notes (Signed)
Patient ID: Tracy Rodriguez, female   DOB: [birth year], 82 y.o.   MRN: [id number]    PROGRESS NOTE    Tracy Rodriguez  [id number] DOB: [birth year] DOA: 02/07/2016  PCP: [name], MD   Brief Narrative:   82 y.o. female with medical history significant of hypertension, hyperlipidemia, depression, PAF and not on anticoagulants, breast cancer (s/p of right mastectomy, implant), bipolar disorder, chronic back pain, CKD-3, possible dementia, who presented with worsening mental status and fall. Pt fell on her right side and had right side knee pain.   Assessment & Plan:   Hypertensive urgency - lood pressure 212/77-->205/87 - SBP in 150's this AM  - currently on Diltiazem, Coreg, Hydralazine - continue as needed  - no chest pain, can d/c telemetry monitor   Fall - Likely due to multifactorial etiology, including possible dementia, possible Parkinson's disease given resting hand tremor and generalized deconditioning.  - Pt has chronic A fib, but not on anticoagulants and has poor balance, at high risk of getting stroke.  - CT-head is negative for acute intracranial abnormalities.  - XRAY of the knee with no acute abnormalities  - continue when necessary tramadol for pain - MRI brain with no evidence of acute stroke  - PT eval done, SNF recommended, daughter in agreement - SW consulted for assistance   Acute encephalopathy - Possibly due to hypertensive encephalopathy - pt looks better this AM, daughter says pt closer to baseline - neurology consulted, recommended outpatient referral to neurology for evaluation of possible underlying Alzheimer's vs Parkinson's dementia vs depression - OOB to chair and ambulation as tolerated   Elevated troponin - Troponin 0.03. EKG has nonspecific T-wave change.  - Most likely due to demand ischemia secondary to hypertensive urgency. Patient denies chest pain - would not check troponins unless pt with chest pain  - d/c telemetry monitor    Hypokalemia - supplemented and WNL this AM - BMP In AM  Acute kidney injury - encourage oral intake to see if that will help - BMP In AM  Tremor of right hand:  - Unclear etiology, possible Parkinson's disease - neurology consulted  - will need outpatient referral about 2-3 months after discharge to make sure delirium resolved and no other confounding factors such as uncontrolled HTN, infection   Atrial Fibrillation - CHA2DS2-VASc Score is 4, needs oral anticoagulation, but pt is not on AC at home due to high risk of fall. Heart rate is well controlled. - continue Digoxin - continue Dilt-Xr  Bipolar disorder and Depression - stable  - Continue home medications: Lexapro, Seroquel - if persistent agitation, can add Zyprexa per neurology recommendation  DVT prophylaxis: Lovenox SQ Code Status: DNR Family Communication: Patient and daughter at bedside  Disposition Plan: SNF likely in 1-2 days when BP better controlled   Consultants:  None  Procedures:   None  Antimicrobials:   None  Subjective: No events overnight.   Objective: Vitals:   02/10/16 1515 02/10/16 2120 02/10/16 2343 02/11/16 0611  BP: (!) 128/46 (!) 160/61 (!) 166/69 (!) 151/74  Pulse:  74 72 75  Resp:    18  Temp:  98.2 F (36.8 C)  98 F (36.7 C)  TempSrc:  Oral  Oral  SpO2:  94%  95%  Weight:    78.2 kg (172 lb 4.8 oz)    Intake/Output Summary (Last 24 hours) at 02/11/16 0710 Last data filed at 02/11/16 0202  Gross per 24 hour  Intake  120 ml  Output              600 ml  Net             -480 ml   Filed Weights   02/11/16 0611  Weight: 78.2 kg (172 lb 4.8 oz)    Examination:  General exam: Appears alert, follows commands, NAD Respiratory system: Respiratory effort normal. Cardiovascular system: IRRR. No JVD, rubs, gallops or clicks. No pedal edema. Gastrointestinal system: Abdomen is nondistended, soft and nontender. No organomegaly or masses felt.  Central nervous  system: alert and oriented to name only, follows commands  Extremities: Symmetric 5 x 5 power.  Data Reviewed: I have personally reviewed following labs and imaging studies  CBC:  Recent Labs Lab 02/07/16 2021 02/08/16 0514 02/09/16 0357 02/10/16 0455 02/11/16 0509  WBC 7.5 7.4 6.5 6.4 6.7  NEUTROABS 5.5  --   --   --   --   HGB 11.7* 11.8* 11.0* 11.0* 10.7*  HCT 37.3 38.4 35.5* 35.2* 34.7*  MCV 83.8 84.0 83.3 83.4 83.0  PLT 214 214 203 199 [id number]   Basic Metabolic Panel:  Recent Labs Lab 02/07/16 2021 02/08/16 0514 02/09/16 0357 02/10/16 0455 02/11/16 0509  NA 141 143 142 142 142  K 3.7 3.4* 3.7 3.6 3.5  CL 106 107 107 110 111  CO2 27 27 25 23 23   GLUCOSE 109* 103* 99 86 107*  BUN 11 11 10 12 15   CREATININE 1.21* 0.95 1.08* 1.13* 1.20*  CALCIUM 9.1 8.9 8.8* 8.5* 8.5*   Liver Function Tests:  Recent Labs Lab 02/07/16 2021  AST 25  ALT 17  ALKPHOS 59  BILITOT 0.5  PROT 6.8  ALBUMIN 3.7   Coagulation Profile:  Recent Labs Lab 02/07/16 2021  INR 1.19   Cardiac Enzymes:  Recent Labs Lab 02/07/16 2021 02/07/16 2247 02/08/16 0514 02/08/16 1120  CKTOTAL  --  74  --   --   TROPONINI 0.03* 0.03* 0.04* 0.05*   Lipid Profile: No results for input(s): CHOL, HDL, LDLCALC, TRIG, CHOLHDL, LDLDIRECT in the last 72 hours. Urine analysis:    Component Value Date/Time   COLORURINE YELLOW 02/08/2016 0300   APPEARANCEUR CLEAR 02/08/2016 0300   LABSPEC 1.008 02/08/2016 0300   PHURINE 7.5 02/08/2016 0300   GLUCOSEU NEGATIVE 02/08/2016 0300   HGBUR NEGATIVE 02/08/2016 0300   BILIRUBINUR NEGATIVE 02/08/2016 0300   BILIRUBINUR n 07/03/2015 1457   KETONESUR NEGATIVE 02/08/2016 0300   PROTEINUR NEGATIVE 02/08/2016 0300   UROBILINOGEN 1.0 07/03/2015 1457   UROBILINOGEN 0.2 08/04/2014 0657   NITRITE NEGATIVE 02/08/2016 0300   LEUKOCYTESUR NEGATIVE 02/08/2016 0300   Radiology Studies: No results found.    Scheduled Meds: . aspirin  150 mg Rectal Daily  .  atorvastatin  40 mg Oral q1800  . carvedilol  6.25 mg Oral BID WC  . digoxin  125 mcg Oral Daily  . diltiazem  120 mg Oral Daily  . enoxaparin (LOVENOX) injection  40 mg Subcutaneous Daily  . escitalopram  10 mg Oral Daily  . gabapentin  300 mg Oral BID  . hydrALAZINE  25 mg Oral Q8H  . mirabegron ER  25 mg Oral Daily  . QUEtiapine  25 mg Oral BID  . sodium chloride flush  3 mL Intravenous Q12H  . traZODone  150 mg Oral QHS   Continuous Infusions: . sodium chloride 50 mL/hr at 02/09/16 2226     LOS: 4 days   Time spent: 20 minutes  Faye Ramsay, MD [facility] Pager [phone]  If 7PM-7AM, please contact night-coverage [email or web address] Password [email or web address] 02/11/2016, 7:10 AM

## 2016-02-12 DIAGNOSIS — Z5181 Encounter for therapeutic drug level monitoring: Secondary | ICD-10-CM | POA: Diagnosis not present

## 2016-02-12 DIAGNOSIS — I10 Essential (primary) hypertension: Secondary | ICD-10-CM | POA: Diagnosis not present

## 2016-02-12 DIAGNOSIS — R251 Tremor, unspecified: Secondary | ICD-10-CM | POA: Diagnosis not present

## 2016-02-12 DIAGNOSIS — M549 Dorsalgia, unspecified: Secondary | ICD-10-CM | POA: Diagnosis not present

## 2016-02-12 DIAGNOSIS — W19XXXA Unspecified fall, initial encounter: Secondary | ICD-10-CM | POA: Diagnosis not present

## 2016-02-12 DIAGNOSIS — R5381 Other malaise: Secondary | ICD-10-CM | POA: Diagnosis not present

## 2016-02-12 DIAGNOSIS — R41841 Cognitive communication deficit: Secondary | ICD-10-CM | POA: Diagnosis not present

## 2016-02-12 DIAGNOSIS — M6281 Muscle weakness (generalized): Secondary | ICD-10-CM | POA: Diagnosis not present

## 2016-02-12 DIAGNOSIS — R278 Other lack of coordination: Secondary | ICD-10-CM | POA: Diagnosis not present

## 2016-02-12 DIAGNOSIS — R338 Other retention of urine: Secondary | ICD-10-CM | POA: Diagnosis not present

## 2016-02-12 DIAGNOSIS — I4891 Unspecified atrial fibrillation: Secondary | ICD-10-CM | POA: Diagnosis not present

## 2016-02-12 DIAGNOSIS — E784 Other hyperlipidemia: Secondary | ICD-10-CM | POA: Diagnosis not present

## 2016-02-12 DIAGNOSIS — N183 Chronic kidney disease, stage 3 (moderate): Secondary | ICD-10-CM | POA: Diagnosis not present

## 2016-02-12 DIAGNOSIS — R410 Disorientation, unspecified: Secondary | ICD-10-CM | POA: Diagnosis not present

## 2016-02-12 DIAGNOSIS — I160 Hypertensive urgency: Secondary | ICD-10-CM | POA: Diagnosis not present

## 2016-02-12 DIAGNOSIS — R262 Difficulty in walking, not elsewhere classified: Secondary | ICD-10-CM | POA: Diagnosis not present

## 2016-02-12 DIAGNOSIS — G4700 Insomnia, unspecified: Secondary | ICD-10-CM | POA: Diagnosis not present

## 2016-02-12 DIAGNOSIS — F319 Bipolar disorder, unspecified: Secondary | ICD-10-CM | POA: Diagnosis not present

## 2016-02-12 DIAGNOSIS — Z9181 History of falling: Secondary | ICD-10-CM | POA: Diagnosis not present

## 2016-02-12 DIAGNOSIS — R748 Abnormal levels of other serum enzymes: Secondary | ICD-10-CM | POA: Diagnosis not present

## 2016-02-12 DIAGNOSIS — S8991XD Unspecified injury of right lower leg, subsequent encounter: Secondary | ICD-10-CM | POA: Diagnosis not present

## 2016-02-12 DIAGNOSIS — E569 Vitamin deficiency, unspecified: Secondary | ICD-10-CM | POA: Diagnosis not present

## 2016-02-12 LAB — BASIC METABOLIC PANEL
Anion gap: 8 (ref 5–15)
BUN: 11 mg/dL (ref 6–20)
CO2: 21 mmol/L — ABNORMAL LOW (ref 22–32)
Calcium: 8.4 mg/dL — ABNORMAL LOW (ref 8.9–10.3)
Chloride: 112 mmol/L — ABNORMAL HIGH (ref 101–111)
Creatinine, Ser: 1.03 mg/dL — ABNORMAL HIGH (ref 0.44–1.00)
GFR calc Af Amer: 57 mL/min — ABNORMAL LOW (ref >60)
GFR calc non Af Amer: 49 mL/min — ABNORMAL LOW (ref >60)
Glucose, Bld: 98 mg/dL (ref 65–99)
Potassium: 3.3 mmol/L — ABNORMAL LOW (ref 3.5–5.1)
Sodium: 141 mmol/L (ref 135–145)

## 2016-02-12 LAB — CBC
HCT: 33.6 % — ABNORMAL LOW (ref 36.0–46.0)
Hemoglobin: 10.6 g/dL — ABNORMAL LOW (ref 12.0–15.0)
MCH: 26.4 pg (ref 26.0–34.0)
MCHC: 31.5 g/dL (ref 30.0–36.0)
MCV: 83.6 fL (ref 78.0–100.0)
Platelets: 184 10*3/uL (ref 150–400)
RBC: 4.02 MIL/uL (ref 3.87–5.11)
RDW: 20.9 % — ABNORMAL HIGH (ref 11.5–15.5)
WBC: 7 10*3/uL (ref 4.0–10.5)

## 2016-02-12 LAB — GLUCOSE, CAPILLARY: Glucose-Capillary: 85 mg/dL (ref 65–99)

## 2016-02-12 MED ORDER — ASPIRIN 81 MG PO TBEC
81.0000 mg | DELAYED_RELEASE_TABLET | Freq: Every day | ORAL | Status: DC
Start: 2016-02-12 — End: 2016-02-12

## 2016-02-12 MED ORDER — CARVEDILOL 6.25 MG PO TABS: 6.2500 mg | ORAL_TABLET | Freq: Two times a day (BID) | ORAL | Status: DC

## 2016-02-12 MED ORDER — FUROSEMIDE 40 MG PO TABS: 20.0000 mg | ORAL_TABLET | Freq: Every day | ORAL | Status: DC

## 2016-02-12 MED ORDER — TRAMADOL HCL 50 MG PO TABS: 50.0000 mg | ORAL_TABLET | Freq: Four times a day (QID) | ORAL | 0 refills | Status: DC | PRN

## 2016-02-12 MED ORDER — TRAZODONE HCL 150 MG PO TABS
ORAL_TABLET | ORAL | 0 refills | Status: DC
Start: 2016-02-12 — End: 2016-03-09

## 2016-02-12 MED ORDER — QUETIAPINE FUMARATE 25 MG PO TABS: 25.0000 mg | ORAL_TABLET | Freq: Two times a day (BID) | ORAL | 0 refills | Status: DC

## 2016-02-12 MED ORDER — ACETAMINOPHEN 325 MG PO TABS: 650.0000 mg | ORAL_TABLET | Freq: Four times a day (QID) | ORAL | Status: DC | PRN

## 2016-02-12 MED ORDER — POTASSIUM CHLORIDE CRYS ER 20 MEQ PO TBCR
40.0000 meq | EXTENDED_RELEASE_TABLET | Freq: Once | ORAL | Status: AC
  Administered 2016-02-12: 40 meq via ORAL
  Filled 2016-02-12: qty 2

## 2016-02-12 MED ORDER — ASPIRIN EC 81 MG PO TBEC
81.0000 mg | DELAYED_RELEASE_TABLET | Freq: Every day | ORAL | Status: DC
  Administered 2016-02-12: 81 mg via ORAL
  Filled 2016-02-12: qty 1

## 2016-02-12 MED ORDER — POTASSIUM CHLORIDE CRYS ER 20 MEQ PO TBCR: 20.0000 meq | EXTENDED_RELEASE_TABLET | Freq: Every day | ORAL | Status: DC

## 2016-02-12 MED ORDER — ATORVASTATIN CALCIUM 20 MG PO TABS: 40.0000 mg | ORAL_TABLET | Freq: Every day | ORAL | 3 refills | Status: DC

## 2016-02-12 NOTE — Discharge Instructions (Signed)
Hypertension Hypertension, commonly called high blood pressure, is when the force of blood pumping through your arteries is too strong. Your arteries are the blood vessels that carry blood from your heart throughout your body. A blood pressure reading consists of a higher number over a lower number, such as 110/72. The higher number (systolic) is the pressure inside your arteries when your heart pumps. The lower number (diastolic) is the pressure inside your arteries when your heart relaxes. Ideally you want your blood pressure below 120/80. Hypertension forces your heart to work harder to pump blood. Your arteries may become narrow or stiff. Having untreated or uncontrolled hypertension can cause heart attack, stroke, kidney disease, and other problems. What increases the risk? Some risk factors for high blood pressure are controllable. Others are not. Risk factors you cannot control include:  Race. You may be at higher risk if you are African American.  Age. Risk increases with age.  Gender. Men are at higher risk than women before age 45 years. After age 65, women are at higher risk than men. Risk factors you can control include:  Not getting enough exercise or physical activity.  Being overweight.  Getting too much fat, sugar, calories, or salt in your diet.  Drinking too much alcohol. What are the signs or symptoms? Hypertension does not usually cause signs or symptoms. Extremely high blood pressure (hypertensive crisis) may cause headache, anxiety, shortness of breath, and nosebleed. How is this diagnosed? To check if you have hypertension, your health care provider will measure your blood pressure while you are seated, with your arm held at the level of your heart. It should be measured at least twice using the same arm. Certain conditions can cause a difference in blood pressure between your right and left arms. A blood pressure reading that is higher than normal on one occasion does  not mean that you need treatment. If it is not clear whether you have high blood pressure, you may be asked to return on a different day to have your blood pressure checked again. Or, you may be asked to monitor your blood pressure at home for 1 or more weeks. How is this treated? Treating high blood pressure includes making lifestyle changes and possibly taking medicine. Living a healthy lifestyle can help lower high blood pressure. You may need to change some of your habits. Lifestyle changes may include:  Following the DASH diet. This diet is high in fruits, vegetables, and whole grains. It is low in salt, red meat, and added sugars.  Keep your sodium intake below 2,300 mg per day.  Getting at least 30-45 minutes of aerobic exercise at least 4 times per week.  Losing weight if necessary.  Not smoking.  Limiting alcoholic beverages.  Learning ways to reduce stress. Your health care provider may prescribe medicine if lifestyle changes are not enough to get your blood pressure under control, and if one of the following is true:  You are 18-59 years of age and your systolic blood pressure is above 140.  You are 60 years of age or older, and your systolic blood pressure is above 150.  Your diastolic blood pressure is above 90.  You have diabetes, and your systolic blood pressure is over 140 or your diastolic blood pressure is over 90.  You have kidney disease and your blood pressure is above 140/90.  You have heart disease and your blood pressure is above 140/90. Your personal target blood pressure may vary depending on your medical   conditions, your age, and other factors. Follow these instructions at home:  Have your blood pressure rechecked as directed by your health care provider.  Take medicines only as directed by your health care provider. Follow the directions carefully. Blood pressure medicines must be taken as prescribed. The medicine does not work as well when you skip  doses. Skipping doses also puts you at risk for problems.  Do not smoke.  Monitor your blood pressure at home as directed by your health care provider. Contact a health care provider if:  You think you are having a reaction to medicines taken.  You have recurrent headaches or feel dizzy.  You have swelling in your ankles.  You have trouble with your vision. Get help right away if:  You develop a severe headache or confusion.  You have unusual weakness, numbness, or feel faint.  You have severe chest or abdominal pain.  You vomit repeatedly.  You have trouble breathing. This information is not intended to replace advice given to you by your health care provider. Make sure you discuss any questions you have with your health care provider. Document Released: 02/25/2005 Document Revised: 08/03/2015 Document Reviewed: 12/18/2012 Elsevier Interactive Patient Education  2017 [employer]  

## 2016-02-12 NOTE — Progress Notes (Signed)
Pt prepared for d/c to SNF. IV d/c'd. Skin intact except as charted in most recent assessments. Foley in place. Vitals are stable. Report called to receiving facility. Pt to be transported by ambulance service.

## 2016-02-12 NOTE — Care Management Important Message (Signed)
Important Message  Patient Details  Name: Nyalah Hann MRN: [id number] Date of Birth: 12/16/1931   Medicare Important Message Given:  Yes    Rylin Seavey Abena 02/12/2016, 11:57 AM

## 2016-02-12 NOTE — Clinical Social Work Placement (Signed)
   CLINICAL SOCIAL WORK PLACEMENT  NOTE  Date:  02/12/2016  Patient Details  Name: Tracy Rodriguez MRN: [id number] Date of Birth: 1932-03-01  Clinical Social Work is seeking post-discharge placement for this patient at the [place] level of care (*CSW will initial, date and re-position this form in  chart as items are completed):      Patient/family provided with [facility]'s list of facilities offering this level of care within the geographic area requested by the patient (or if unable, by the patient's family).      Patient/family informed of their freedom to choose among providers that offer the needed level of care, that participate in Medicare, Medicaid or managed care program needed by the patient, have an available bed and are willing to accept the patient.      Patient/family informed of [facility]'s ownership interest in [facility] and [facility], as well as of the fact that they are under no obligation to receive care at these facilities.  PASRR submitted to EDS on 02/09/16     PASRR number received on 02/12/16     Existing PASRR number confirmed on       FL2 transmitted to all facilities in geographic area requested by pt/family on 02/09/16     FL2 transmitted to all facilities within larger geographic area on       Patient informed that his/her managed care company has contracts with or will negotiate with certain facilities, including the following:        Yes   Patient/family informed of bed offers received.  Patient chooses bed at [facility]     Physician recommends and patient chooses bed at      Patient to be transferred to [facility] on 02/12/16.  Patient to be transferred to facility by [facility]     Patient family notified on 02/12/16 of transfer.  Name of family member notified:  dtr     PHYSICIAN Please sign FL2, Please sign DNR     Additional Comment:     _______________________________________________ [name], LCSW 02/12/2016, 2:14 PM

## 2016-02-12 NOTE — Progress Notes (Signed)
Patient will discharge to [facility] Anticipated discharge date: 12/4 Family notified: dtr at bedside Transportation by [facility]- called at 2:10pm  CSW signing off.  [name], LCSW Clinical Social Worker [phone]

## 2016-02-12 NOTE — Discharge Summary (Signed)
[facility]  Physician Discharge Summary   Patient ID: Tracy Rodriguez MRN: [id number] DOB/AGE: [birth year] 80 y.o.  Admit date: 02/07/2016 Discharge date: 02/12/2016  PCP: [name], MD  DISCHARGE DIAGNOSES:  Principal Problem:   Hypertensive urgency Active Problems:   Bipolar disorder ([place])   Chronic atrial fibrillation (HCC)   Acute encephalopathy   Elevated troponin   Tremor of right hand   Fall   Acute renal failure superimposed on stage 3 chronic kidney disease ([place])   RECOMMENDATIONS FOR OUTPATIENT FOLLOW UP: 1. CBC, basic metabolic panel and PT/INR on 12/8 2. Maintain INR between 2-3 3. Referral sent to [facility] for outpatient neurology consultation in 4 weeks or so 4. Speech and language pathologist to follow at skilled nursing facility for dysphagia   DISCHARGE CONDITION: fair  Diet recommendation: Dysphagia 2 with thin liquids  Filed Weights   02/11/16 0611  Weight: 78.2 kg (172 lb 4.8 oz)    INITIAL HISTORY: 82 y.o.femalewith medical history significant of hypertension, hyperlipidemia, depression, PAF and not on anticoagulants, breast cancer (s/p of right mastectomy, implant), bipolar disorder, chronic back pain, CKD-3, possible dementia, who presented with worsening mental status and fall. Pt fell on her right side and had right side knee pain.   HOSPITAL COURSE:   Hypertensive urgency Patient presented with blood pressures in the [id number] systolic. Patient was started back on her antihypertensives. Coreg was added. Blood pressure is now better controlled. Will need continued monitoring at the skilled nursing facility with adjustment of dose as indicated.  Fall Likely due to multifactorial etiology, including possible dementia, possible Parkinson's disease given resting hand tremor and generalized deconditioning. CT-head isnegative for acute intracranial abnormalities. XRAY of the knee with no acute abnormalities. Continue  when necessary tramadol for pain. MRI brain with no evidence of acute stroke. PT eval done, SNF recommended, daughter in agreement.  Acute encephalopathy Possibly due to hypertensive encephalopathy. No other etiology was found. Neurology was consulted due to patient's tremors and memory impairment. It is felt that patient could have Alzheimer's dementia. However, if she will need to be reevaluated once she has recovered completely from her delirium. Outpatient neurology referral has been made. Mental status is much improved, per daughter.  Elevated troponin roponin 0.03. EKG has nonspecific T-wave change. Most likely due to demand ischemia secondary to hypertensive urgency. Patient denies chest pain.   Hypokalemia Supplemented. Repeat labs at skilled nursing facility  Acute kidney injury Renal function appears to be at baseline. Noted to be on Lasix at home. This will be resumed at a lower dose. Check basic metabolic panel the end of this week.  Tremor of right hand:  Unclear etiology, possible Parkinson's disease. Seen by neurology here. Outpatient referral has been made for follow-up.  Atrial Fibrillation CHA2DS2-VASc Scoreis 4. Patient was not placed on warfarin at the time of admission as the providers were told by family that the patient was no longer taking warfarin. Her daughter mentioned to me that she was mistaken about this. Patient has been on warfarin at home. This can be resumed at the skilled nursing facility. No obvious contraindications noted at this time. If patient continues to have frequent falls, then this may have to be revisited. PT/INR to be checked on 12/8.  Continue digoxin and diltiazem. Digoxin level was therapeutic.  Bipolar disorder and Depression Stable. Continue home medications.  Overall improved. Okay for discharge to skilled nursing facility. Discussed in detail with patient's daughter.   PERTINENT LABS:  The results of significant  diagnostics from  this hospitalization (including imaging, microbiology, ancillary and laboratory) are listed below for reference.     Labs: Basic Metabolic Panel:  Recent Labs Lab 02/08/16 0514 02/09/16 0357 02/10/16 0455 02/11/16 0509 02/12/16 0632  NA 143 142 142 142 141  K 3.4* 3.7 3.6 3.5 3.3*  CL 107 107 110 111 112*  CO2 27 25 23 23  21*  GLUCOSE 103* 99 86 107* 98  BUN 11 10 12 15 11   CREATININE 0.95 1.08* 1.13* 1.20* 1.03*  CALCIUM 8.9 8.8* 8.5* 8.5* 8.4*   Liver Function Tests:  Recent Labs Lab 02/07/16 2021  AST 25  ALT 17  ALKPHOS 59  BILITOT 0.5  PROT 6.8  ALBUMIN 3.7   CBC:  Recent Labs Lab 02/07/16 2021 02/08/16 0514 02/09/16 0357 02/10/16 0455 02/11/16 0509 02/12/16 0632  WBC 7.5 7.4 6.5 6.4 6.7 7.0  NEUTROABS 5.5  --   --   --   --   --   HGB 11.7* 11.8* 11.0* 11.0* 10.7* 10.6*  HCT 37.3 38.4 35.5* 35.2* 34.7* 33.6*  MCV 83.8 84.0 83.3 83.4 83.0 83.6  PLT 214 214 203 199 186 184   Cardiac Enzymes:  Recent Labs Lab 02/07/16 2021 02/07/16 2247 02/08/16 0514 02/08/16 1120  CKTOTAL  --  74  --   --   TROPONINI 0.03* 0.03* 0.04* 0.05*   CBG:  Recent Labs Lab 02/09/16 0819 02/10/16 0827 02/11/16 0817 02/12/16 0806  GLUCAP 93 89 107* 85     IMAGING STUDIES Dg Chest 2 View  Result Date: 02/07/2016 CLINICAL DATA:  Recent fall with altered mental status EXAM: CHEST  2 VIEW COMPARISON:  12/06/2015 FINDINGS: Cardiac shadow is stable. Thickening of the minor and major fissures are again identified on the right and stable. No focal infiltrate or sizable effusion is noted. No acute bony abnormality is noted. IMPRESSION: Thickening of the major and minor fissures on the right. No acute abnormality is noted. Electronically Signed   By: Inez Catalina M.D.   On: 02/07/2016 21:51   Ct Head Wo Contrast  Result Date: 02/07/2016 CLINICAL DATA:  Altered mental status EXAM: CT HEAD WITHOUT CONTRAST TECHNIQUE: Contiguous axial images were obtained from the base  of the skull through the vertex without intravenous contrast. COMPARISON:  02/03/2016 FINDINGS: Brain: No evidence of acute infarction, hemorrhage, hydrocephalus, extra-axial collection or mass lesion/mass effect. Mild chronic white matter ischemic change and mild atrophy are seen. Vascular: No hyperdense vessel or unexpected calcification. Skull: Normal. Negative for fracture or focal lesion. Sinuses/Orbits: No acute finding. Other: None. IMPRESSION: No acute intracranial abnormality noted. Electronically Signed   By: Inez Catalina M.D.   On: 02/07/2016 19:40   Ct Head Wo Contrast  Result Date: 02/03/2016 CLINICAL DATA:  Status post fall, with concern for head or cervical spine injury. Initial encounter. EXAM: CT HEAD WITHOUT CONTRAST CT CERVICAL SPINE WITHOUT CONTRAST TECHNIQUE: Multidetector CT imaging of the head and cervical spine was performed following the standard protocol without intravenous contrast. Multiplanar CT image reconstructions of the cervical spine were also generated. COMPARISON:  CT of the head performed 12/05/2015 FINDINGS: CT HEAD FINDINGS Brain: No evidence of acute infarction, hemorrhage, hydrocephalus, extra-axial collection or mass lesion/mass effect. Prominence of the ventricles and sulci reflects mild cortical volume loss. Mild periventricular white matter change likely reflects small vessel ischemic microangiopathy. Mild cerebellar atrophy is noted. The brainstem and fourth ventricle are within normal limits. The basal ganglia are unremarkable in appearance. The cerebral hemispheres demonstrate grossly  normal gray-white differentiation. No mass effect or midline shift is seen. Vascular: No hyperdense vessel or unexpected calcification. Skull: There is no evidence of fracture; visualized osseous structures are unremarkable in appearance. Sinuses/Orbits: The orbits are within normal limits. The paranasal sinuses and mastoid air cells are well-aerated. Other: No significant soft  tissue abnormalities are seen. CT CERVICAL SPINE FINDINGS Alignment: Normal. Skull base and vertebrae: No acute fracture. No primary bone lesion or focal pathologic process. Soft tissues and spinal canal: No prevertebral fluid or swelling. No visible canal hematoma. Disc levels: Intervertebral disc space narrowing is noted at C6-C7, with anterior and posterior disc osteophyte complexes. Mild facet disease is noted at the mid cervical spine. Upper chest: Scarring is noted at the lung apices. The visualized portions of the thyroid gland are unremarkable. Other: No additional soft tissue abnormalities are seen. IMPRESSION: 1. No evidence of traumatic intracranial injury or fracture. 2. No evidence of fracture or subluxation along the cervical spine. 3. Mild cortical volume loss and scattered small vessel ischemic microangiopathy. 4. Mild degenerative change along the mid to lower cervical spine. 5. Scarring at the lung apices. Electronically Signed   By: Garald Balding M.D.   On: 02/03/2016 02:38   Ct Cervical Spine Wo Contrast  Result Date: 02/03/2016 CLINICAL DATA:  Status post fall, with concern for head or cervical spine injury. Initial encounter. EXAM: CT HEAD WITHOUT CONTRAST CT CERVICAL SPINE WITHOUT CONTRAST TECHNIQUE: Multidetector CT imaging of the head and cervical spine was performed following the standard protocol without intravenous contrast. Multiplanar CT image reconstructions of the cervical spine were also generated. COMPARISON:  CT of the head performed 12/05/2015 FINDINGS: CT HEAD FINDINGS Brain: No evidence of acute infarction, hemorrhage, hydrocephalus, extra-axial collection or mass lesion/mass effect. Prominence of the ventricles and sulci reflects mild cortical volume loss. Mild periventricular white matter change likely reflects small vessel ischemic microangiopathy. Mild cerebellar atrophy is noted. The brainstem and fourth ventricle are within normal limits. The basal ganglia are  unremarkable in appearance. The cerebral hemispheres demonstrate grossly normal gray-white differentiation. No mass effect or midline shift is seen. Vascular: No hyperdense vessel or unexpected calcification. Skull: There is no evidence of fracture; visualized osseous structures are unremarkable in appearance. Sinuses/Orbits: The orbits are within normal limits. The paranasal sinuses and mastoid air cells are well-aerated. Other: No significant soft tissue abnormalities are seen. CT CERVICAL SPINE FINDINGS Alignment: Normal. Skull base and vertebrae: No acute fracture. No primary bone lesion or focal pathologic process. Soft tissues and spinal canal: No prevertebral fluid or swelling. No visible canal hematoma. Disc levels: Intervertebral disc space narrowing is noted at C6-C7, with anterior and posterior disc osteophyte complexes. Mild facet disease is noted at the mid cervical spine. Upper chest: Scarring is noted at the lung apices. The visualized portions of the thyroid gland are unremarkable. Other: No additional soft tissue abnormalities are seen. IMPRESSION: 1. No evidence of traumatic intracranial injury or fracture. 2. No evidence of fracture or subluxation along the cervical spine. 3. Mild cortical volume loss and scattered small vessel ischemic microangiopathy. 4. Mild degenerative change along the mid to lower cervical spine. 5. Scarring at the lung apices. Electronically Signed   By: Garald Balding M.D.   On: 02/03/2016 02:38   Mr Brain Wo Contrast  Result Date: 02/08/2016 CLINICAL DATA:  Fall.  Altered mental status. EXAM: MRI HEAD WITHOUT CONTRAST TECHNIQUE: Multiplanar, multiecho pulse sequences of the brain and surrounding structures were obtained without intravenous contrast. COMPARISON:  Head CT  02/07/2016 FINDINGS: Brain: No acute infarct or intraparenchymal hemorrhage. There is beginning confluent hyperintense T2-weighted signal within the periventricular white matter, mildly asymmetric T2  hyperintensity and volume loss of the left hippocampus. No mass lesion or midline shift. No hydrocephalus or extra-axial fluid collection. No age advanced atrophy. The midline structures are normal. Vascular: Major intracranial arterial and venous sinus flow voids are preserved. No evidence of chronic microhemorrhage or amyloid angiopathy. Skull and upper cervical spine: The visualized skull base, calvarium, upper cervical spine and extracranial soft tissues are normal. Sinuses/Orbits: No fluid levels or advanced mucosal thickening. No mastoid effusion. Normal orbits. IMPRESSION: 1. No acute intracranial abnormality. 2. Mild atrophy and findings of chronic microvascular ischemia. Electronically Signed   By: Ulyses Jarred M.D.   On: 02/08/2016 05:21   Dg Knee Complete 4 Views Right  Result Date: 02/07/2016 CLINICAL DATA:  Status post fall, with right knee pain. Initial encounter. EXAM: RIGHT KNEE - COMPLETE 4+ VIEW COMPARISON:  None. FINDINGS: There is no evidence of fracture or dislocation. A bone island is noted at the proximal tibia. The joint spaces are preserved. No significant degenerative change is seen; the patellofemoral joint is grossly unremarkable in appearance. No significant joint effusion is seen. The visualized soft tissues are normal in appearance. IMPRESSION: No evidence of fracture or dislocation. Electronically Signed   By: Garald Balding M.D.   On: 02/07/2016 23:49   Dg Hips Bilat With Pelvis 3-4 Views  Result Date: 02/07/2016 CLINICAL DATA:  Bilateral hip pain following fall, initial encounter EXAM: DG HIP (WITH OR WITHOUT PELVIS) 3-4V BILAT COMPARISON:  None. FINDINGS: Pelvic ring is intact. No acute fracture or dislocation is noted. No soft tissue abnormality is noted. IMPRESSION: No acute abnormality noted. Electronically Signed   By: Inez Catalina M.D.   On: 02/07/2016 21:49    DISCHARGE EXAMINATION: Vitals:   02/11/16 1507 02/11/16 2348 02/12/16 0431 02/12/16 1223  BP: (!)  155/50 (!) 171/56 (!) 159/56 (!) 148/62  Pulse:  70 76 75  Resp:  18 18 20   Temp:  98.3 F (36.8 C) 98.4 F (36.9 C) 97.5 F (36.4 C)  TempSrc:    Oral  SpO2:  92% 95% 97%  Weight:       General appearance: alert, cooperative, slightly distracted, appears stated age and no distress Resp: clear to auscultation bilaterally Cardio: regular rate and rhythm, S1, S2 normal, no murmur, click, rub or gallop GI: soft, non-tender; bowel sounds normal; no masses,  no organomegaly Extremities: extremities normal, atraumatic, no cyanosis or edema  DISPOSITION: SNF  Discharge Instructions    Ambulatory referral to Neurology    Complete by:  As directed    An appointment is requested in approximately: 4 weeks. With any available provider. For further evaluation of possible Dementia and tremors.   Call MD for:  difficulty breathing, headache or visual disturbances    Complete by:  As directed    Call MD for:  extreme fatigue    Complete by:  As directed    Call MD for:  persistant dizziness or light-headedness    Complete by:  As directed    Call MD for:  persistant nausea and vomiting    Complete by:  As directed    Call MD for:  severe uncontrolled pain    Complete by:  As directed    Call MD for:  temperature >100.4    Complete by:  As directed    Discharge instructions    Complete by:  As directed    CBC and basic metabolic panel and PT/INR on 12/8. Maintain INR between 2 and 3. Referral has been sent to [facility] neurology.   You were cared for by a hospitalist during your hospital stay. If you have any questions about your discharge medications or the care you received while you were in the hospital after you are discharged, you can call the unit and asked to speak with the hospitalist on call if the hospitalist that took care of you is not available. Once you are discharged, your primary care physician will handle any further medical issues. Please note that NO REFILLS for any discharge  medications will be authorized once you are discharged, as it is imperative that you return to your primary care physician (or establish a relationship with a primary care physician if you do not have one) for your aftercare needs so that they can reassess your need for medications and monitor your lab values. If you do not have a primary care physician, you can call [phone] for a physician referral.   Increase activity slowly    Complete by:  As directed       ALLERGIES:  Allergies  Allergen Reactions  . Codeine Anaphylaxis  . Sulfa Antibiotics Rash  . Darvon [Propoxyphene] Other (See Comments)  . Floxin [Ofloxacin] Other (See Comments)  . Levsin [Hyoscyamine Sulfate] Other (See Comments)  . Penicillins     Has patient had a PCN reaction causing immediate rash, facial/tongue/throat swelling, SOB or lightheadedness with hypotension: Yes Has patient had a PCN reaction causing severe rash involving mucus membranes or skin necrosis: No Has patient had a PCN reaction that required hospitalization No Has patient had a PCN reaction occurring within the last 10 years: No If all of the above answers are "NO", then may proceed with Cephalosporin use.   . Valium [Diazepam] Other (See Comments)  . Zantac [Ranitidine Hcl] Other (See Comments)     Current Discharge Medication List    START taking these medications   Details  acetaminophen (TYLENOL) 325 MG tablet Take 2 tablets (650 mg total) by mouth every 6 (six) hours as needed for mild pain (or Fever >/= 101).    carvedilol (COREG) 6.25 MG tablet Take 1 tablet (6.25 mg total) by mouth 2 (two) times daily with a meal.    potassium chloride SA (K-DUR,KLOR-CON) 20 MEQ tablet Take 1 tablet (20 mEq total) by mouth daily.      CONTINUE these medications which have CHANGED   Details  atorvastatin (LIPITOR) 20 MG tablet Take 2 tablets (40 mg total) by mouth daily. Qty: 90 tablet, Refills: 3    furosemide (LASIX) 40 MG tablet Take 0.5 tablets  (20 mg total) by mouth daily. Qty: 30 tablet    QUEtiapine (SEROQUEL) 25 MG tablet Take 1 tablet (25 mg total) by mouth 2 (two) times daily. Qty: 60 tablet, Refills: 0    traMADol (ULTRAM) 50 MG tablet Take 1 tablet (50 mg total) by mouth every 6 (six) hours as needed for moderate pain. Qty: 20 tablet, Refills: 0    traZODone (DESYREL) 150 MG tablet Take 150 mg by mouth at bedtime Qty: 30 tablet, Refills: 0      CONTINUE these medications which have NOT CHANGED   Details  digoxin (LANOXIN) 0.125 MG tablet Take 1 tablet (125 mcg total) by mouth daily. Qty: 90 tablet, Refills: 3    DILT-XR 120 MG 24 hr capsule Take 120 mg by mouth daily. Refills: 3  escitalopram (LEXAPRO) 10 MG tablet TAKE 1 TABLET(10 MG) BY MOUTH DAILY Qty: 90 tablet, Refills: 1    ferrous sulfate 325 (65 FE) MG tablet Take 325 mg by mouth daily with breakfast.    gabapentin (NEURONTIN) 300 MG capsule Take 1 capsule (300 mg total) by mouth 2 (two) times daily. Qty: 180 capsule, Refills: 3    mirabegron ER (MYRBETRIQ) 25 MG TB24 tablet Take 1 tablet (25 mg total) by mouth daily. Qty: 30 tablet, Refills: 3    warfarin (COUMADIN) 5 MG tablet Take 2.5-5 mg by mouth See admin instructions. Take 5 mg by mouth on Monday and take 2.5 mg by mouth on all other days         Follow-up Information    [name], MD. Schedule an appointment as soon as possible for a visit in 1 week(s).   Specialty:  Internal Medicine Contact information: [place] [phone]           TOTAL DISCHARGE TIME: 76 minutes  [facility] Pager [phone]  02/12/2016, 1:15 PM

## 2016-02-13 ENCOUNTER — Encounter: Payer: Self-pay | Admitting: Internal Medicine

## 2016-02-13 ENCOUNTER — Ambulatory Visit (INDEPENDENT_AMBULATORY_CARE_PROVIDER_SITE_OTHER): Payer: Medicare Other | Admitting: Internal Medicine

## 2016-02-13 DIAGNOSIS — I10 Essential (primary) hypertension: Secondary | ICD-10-CM | POA: Diagnosis not present

## 2016-02-13 DIAGNOSIS — R5381 Other malaise: Secondary | ICD-10-CM | POA: Diagnosis not present

## 2016-02-13 DIAGNOSIS — I4891 Unspecified atrial fibrillation: Secondary | ICD-10-CM | POA: Diagnosis not present

## 2016-02-13 DIAGNOSIS — I482 Chronic atrial fibrillation, unspecified: Secondary | ICD-10-CM

## 2016-02-13 DIAGNOSIS — I160 Hypertensive urgency: Secondary | ICD-10-CM

## 2016-02-13 DIAGNOSIS — R410 Disorientation, unspecified: Secondary | ICD-10-CM | POA: Diagnosis not present

## 2016-02-13 NOTE — Patient Instructions (Signed)
Call or return to clinic prn if these symptoms worsen or fail to improve as anticipated.

## 2016-02-13 NOTE — Progress Notes (Signed)
   Subjective:    Patient ID: Tracy Rodriguez, female    DOB: [birth year], 82 y.o.   MRN: [id number]  HPI  Appointment today was for a family consultation.  Patient was discharged from the hospital yesterday and presently is receiving PT and OT at [facility].  She is scheduled for neurology follow-up in January  The patient was last seen by me 6 weeks ago and has had 2 ED admissions due to falls since that time.  More recently she was admitted with hypertensive crisis.  According to the family,  she was also having word finding difficulties, confusion, and also had difficulty writing.  The patient did have inpatient neurological evaluation.  Some concerns of parkinsonism also raised with dementia shuffling gait, tremor orthostatic hypotension with frequent falls.  According to her 2 daughters, she spends almost all her time in bed.  Usually asleep with very little physical activity Hospital course apparently also, complicated by urinary retention requiring Foley catheterization.  Impression- recurrent falls.  The daughters, estimated at least 12 falls over the past 4 months.  When the patient falls,she is unable to stand and requires EMS assistance.  It is felt that many of these episodes are related to orthostasis, some secondary to loss of balance and mechanical falls.  The patient also has history of syncope, which has been evaluated.  I wonder if the patient had an episode of global aphasia with the word finding difficulties apparent confusion and difficulty with writing  The family hopes to have the patient return to independent living.  After further physical therapy and rehabilitation.  There are financial concerns about assisted living, which they feel the patient will not be able to afford.  Present situation and prognosis discussed at length.  [name]  Review of Systems     Objective:   Physical Exam        Assessment & Plan:

## 2016-02-14 ENCOUNTER — Other Ambulatory Visit: Payer: Self-pay | Admitting: Internal Medicine

## 2016-02-15 ENCOUNTER — Other Ambulatory Visit: Payer: Self-pay | Admitting: Internal Medicine

## 2016-02-16 ENCOUNTER — Other Ambulatory Visit: Payer: Self-pay | Admitting: *Deleted

## 2016-02-16 ENCOUNTER — Ambulatory Visit: Payer: Medicare Other | Admitting: Internal Medicine

## 2016-02-16 DIAGNOSIS — Z0289 Encounter for other administrative examinations: Secondary | ICD-10-CM

## 2016-02-16 MED ORDER — QUETIAPINE FUMARATE 25 MG PO TABS: 25.0000 mg | ORAL_TABLET | Freq: Two times a day (BID) | ORAL | 1 refills | Status: DC

## 2016-02-28 ENCOUNTER — Ambulatory Visit (INDEPENDENT_AMBULATORY_CARE_PROVIDER_SITE_OTHER): Payer: Medicare Other | Admitting: General Practice

## 2016-02-28 ENCOUNTER — Other Ambulatory Visit: Payer: Self-pay | Admitting: General Practice

## 2016-02-28 ENCOUNTER — Ambulatory Visit: Payer: Medicare Other | Admitting: Internal Medicine

## 2016-02-28 DIAGNOSIS — Z5181 Encounter for therapeutic drug level monitoring: Secondary | ICD-10-CM | POA: Diagnosis not present

## 2016-02-28 DIAGNOSIS — I4891 Unspecified atrial fibrillation: Secondary | ICD-10-CM

## 2016-02-28 LAB — POCT INR: INR: 4.3

## 2016-02-28 MED ORDER — WARFARIN SODIUM 4 MG PO TABS: ORAL_TABLET | ORAL | 0 refills | Status: DC

## 2016-02-28 NOTE — Patient Instructions (Signed)
Pre visit review using our clinic review tool, if applicable. No additional management support is needed unless otherwise documented below in the visit note. 

## 2016-03-03 ENCOUNTER — Encounter (HOSPITAL_COMMUNITY): Payer: Self-pay | Admitting: Emergency Medicine

## 2016-03-03 ENCOUNTER — Inpatient Hospital Stay (HOSPITAL_COMMUNITY)
Admission: EM | Admit: 2016-03-03 | Discharge: 2016-03-09 | DRG: 871 | Disposition: A | Discharge status: Skilled Nursing Facility | Payer: Medicare Other | Attending: Internal Medicine | Admitting: Internal Medicine

## 2016-03-03 ENCOUNTER — Emergency Department (HOSPITAL_COMMUNITY): Payer: Medicare Other

## 2016-03-03 DIAGNOSIS — N183 Chronic kidney disease, stage 3 unspecified: Secondary | ICD-10-CM | POA: Diagnosis present

## 2016-03-03 DIAGNOSIS — Z9011 Acquired absence of right breast and nipple: Secondary | ICD-10-CM

## 2016-03-03 DIAGNOSIS — A419 Sepsis, unspecified organism: Principal | ICD-10-CM

## 2016-03-03 DIAGNOSIS — N170 Acute kidney failure with tubular necrosis: Secondary | ICD-10-CM | POA: Diagnosis not present

## 2016-03-03 DIAGNOSIS — I482 Chronic atrial fibrillation: Secondary | ICD-10-CM

## 2016-03-03 DIAGNOSIS — R0602 Shortness of breath: Secondary | ICD-10-CM

## 2016-03-03 DIAGNOSIS — F319 Bipolar disorder, unspecified: Secondary | ICD-10-CM | POA: Diagnosis not present

## 2016-03-03 DIAGNOSIS — F0390 Unspecified dementia without behavioral disturbance: Secondary | ICD-10-CM | POA: Diagnosis present

## 2016-03-03 DIAGNOSIS — Z8249 Family history of ischemic heart disease and other diseases of the circulatory system: Secondary | ICD-10-CM

## 2016-03-03 DIAGNOSIS — D649 Anemia, unspecified: Secondary | ICD-10-CM | POA: Diagnosis present

## 2016-03-03 DIAGNOSIS — R9389 Abnormal findings on diagnostic imaging of other specified body structures: Secondary | ICD-10-CM | POA: Diagnosis present

## 2016-03-03 DIAGNOSIS — R938 Abnormal findings on diagnostic imaging of other specified body structures: Secondary | ICD-10-CM | POA: Diagnosis not present

## 2016-03-03 DIAGNOSIS — I480 Paroxysmal atrial fibrillation: Secondary | ICD-10-CM | POA: Diagnosis present

## 2016-03-03 DIAGNOSIS — R001 Bradycardia, unspecified: Secondary | ICD-10-CM | POA: Diagnosis present

## 2016-03-03 DIAGNOSIS — R531 Weakness: Secondary | ICD-10-CM | POA: Diagnosis not present

## 2016-03-03 DIAGNOSIS — I5033 Acute on chronic diastolic (congestive) heart failure: Secondary | ICD-10-CM | POA: Diagnosis present

## 2016-03-03 DIAGNOSIS — I712 Thoracic aortic aneurysm, without rupture: Secondary | ICD-10-CM | POA: Diagnosis present

## 2016-03-03 DIAGNOSIS — Z7901 Long term (current) use of anticoagulants: Secondary | ICD-10-CM

## 2016-03-03 DIAGNOSIS — B9620 Unspecified Escherichia coli [E. coli] as the cause of diseases classified elsewhere: Secondary | ICD-10-CM | POA: Diagnosis present

## 2016-03-03 DIAGNOSIS — F05 Delirium due to known physiological condition: Secondary | ICD-10-CM | POA: Diagnosis not present

## 2016-03-03 DIAGNOSIS — G9340 Encephalopathy, unspecified: Secondary | ICD-10-CM

## 2016-03-03 DIAGNOSIS — Z803 Family history of malignant neoplasm of breast: Secondary | ICD-10-CM

## 2016-03-03 DIAGNOSIS — Z79899 Other long term (current) drug therapy: Secondary | ICD-10-CM

## 2016-03-03 DIAGNOSIS — Z823 Family history of stroke: Secondary | ICD-10-CM

## 2016-03-03 DIAGNOSIS — R748 Abnormal levels of other serum enzymes: Secondary | ICD-10-CM | POA: Diagnosis present

## 2016-03-03 DIAGNOSIS — N390 Urinary tract infection, site not specified: Secondary | ICD-10-CM

## 2016-03-03 DIAGNOSIS — R402421 Glasgow coma scale score 9-12, in the field [EMT or ambulance]: Secondary | ICD-10-CM | POA: Diagnosis not present

## 2016-03-03 DIAGNOSIS — Z806 Family history of leukemia: Secondary | ICD-10-CM

## 2016-03-03 DIAGNOSIS — N179 Acute kidney failure, unspecified: Secondary | ICD-10-CM

## 2016-03-03 DIAGNOSIS — I509 Heart failure, unspecified: Secondary | ICD-10-CM

## 2016-03-03 DIAGNOSIS — R319 Hematuria, unspecified: Secondary | ICD-10-CM

## 2016-03-03 DIAGNOSIS — Z820 Family history of epilepsy and other diseases of the nervous system: Secondary | ICD-10-CM

## 2016-03-03 DIAGNOSIS — Z66 Do not resuscitate: Secondary | ICD-10-CM | POA: Diagnosis present

## 2016-03-03 DIAGNOSIS — E785 Hyperlipidemia, unspecified: Secondary | ICD-10-CM | POA: Diagnosis present

## 2016-03-03 DIAGNOSIS — I5031 Acute diastolic (congestive) heart failure: Secondary | ICD-10-CM

## 2016-03-03 DIAGNOSIS — R4182 Altered mental status, unspecified: Secondary | ICD-10-CM | POA: Diagnosis not present

## 2016-03-03 DIAGNOSIS — J189 Pneumonia, unspecified organism: Secondary | ICD-10-CM | POA: Diagnosis not present

## 2016-03-03 DIAGNOSIS — Z853 Personal history of malignant neoplasm of breast: Secondary | ICD-10-CM

## 2016-03-03 DIAGNOSIS — J9601 Acute respiratory failure with hypoxia: Secondary | ICD-10-CM

## 2016-03-03 DIAGNOSIS — Z90710 Acquired absence of both cervix and uterus: Secondary | ICD-10-CM

## 2016-03-03 DIAGNOSIS — G8929 Other chronic pain: Secondary | ICD-10-CM | POA: Diagnosis present

## 2016-03-03 DIAGNOSIS — Z9882 Breast implant status: Secondary | ICD-10-CM

## 2016-03-03 DIAGNOSIS — I130 Hypertensive heart and chronic kidney disease with heart failure and stage 1 through stage 4 chronic kidney disease, or unspecified chronic kidney disease: Secondary | ICD-10-CM | POA: Diagnosis present

## 2016-03-03 LAB — CBC WITH DIFFERENTIAL/PLATELET
Basophils Absolute: 0 10*3/uL (ref 0.0–0.1)
Basophils Relative: 0 %
Eosinophils Absolute: 0 10*3/uL (ref 0.0–0.7)
Eosinophils Relative: 0 %
HCT: 34.6 % — ABNORMAL LOW (ref 36.0–46.0)
Hemoglobin: 10.9 g/dL — ABNORMAL LOW (ref 12.0–15.0)
Lymphocytes Relative: 4 %
Lymphs Abs: 0.4 10*3/uL — ABNORMAL LOW (ref 0.7–4.0)
MCH: 27 pg (ref 26.0–34.0)
MCHC: 31.5 g/dL (ref 30.0–36.0)
MCV: 85.6 fL (ref 78.0–100.0)
Monocytes Absolute: 0.2 10*3/uL (ref 0.1–1.0)
Monocytes Relative: 2 %
Neutro Abs: 10.6 10*3/uL — ABNORMAL HIGH (ref 1.7–7.7)
Neutrophils Relative %: 94 %
Platelets: 168 10*3/uL (ref 150–400)
RBC: 4.04 MIL/uL (ref 3.87–5.11)
RDW: 20.1 % — ABNORMAL HIGH (ref 11.5–15.5)
WBC: 11.2 10*3/uL — ABNORMAL HIGH (ref 4.0–10.5)

## 2016-03-03 LAB — COMPREHENSIVE METABOLIC PANEL
ALT: 21 U/L (ref 14–54)
AST: 28 U/L (ref 15–41)
Albumin: 3 g/dL — ABNORMAL LOW (ref 3.5–5.0)
Alkaline Phosphatase: 64 U/L (ref 38–126)
Anion gap: 7 (ref 5–15)
BUN: 19 mg/dL (ref 6–20)
CO2: 24 mmol/L (ref 22–32)
Calcium: 8.1 mg/dL — ABNORMAL LOW (ref 8.9–10.3)
Chloride: 106 mmol/L (ref 101–111)
Creatinine, Ser: 1.51 mg/dL — ABNORMAL HIGH (ref 0.44–1.00)
GFR calc Af Amer: 36 mL/min — ABNORMAL LOW (ref >60)
GFR calc non Af Amer: 31 mL/min — ABNORMAL LOW (ref >60)
Glucose, Bld: 143 mg/dL — ABNORMAL HIGH (ref 65–99)
Potassium: 3.9 mmol/L (ref 3.5–5.1)
Sodium: 137 mmol/L (ref 135–145)
Total Bilirubin: 0.7 mg/dL (ref 0.3–1.2)
Total Protein: 6.4 g/dL — ABNORMAL LOW (ref 6.5–8.1)

## 2016-03-03 LAB — URINALYSIS, ROUTINE W REFLEX MICROSCOPIC
Bilirubin Urine: NEGATIVE
Glucose, UA: NEGATIVE mg/dL
Ketones, ur: NEGATIVE mg/dL
Nitrite: POSITIVE — AB
Protein, ur: 100 mg/dL — AB
Specific Gravity, Urine: 1.009 (ref 1.005–1.030)
Squamous Epithelial / LPF: NONE SEEN
pH: 5 (ref 5.0–8.0)

## 2016-03-03 LAB — CBG MONITORING, ED: Glucose-Capillary: 140 mg/dL — ABNORMAL HIGH (ref 65–99)

## 2016-03-03 LAB — I-STAT TROPONIN, ED: Troponin i, poc: 0.07 ng/mL (ref 0.00–0.08)

## 2016-03-03 LAB — I-STAT CG4 LACTIC ACID, ED: Lactic Acid, Venous: 1.32 mmol/L (ref 0.5–1.9)

## 2016-03-03 LAB — CK: Total CK: 31 U/L — ABNORMAL LOW (ref 38–234)

## 2016-03-03 LAB — APTT: aPTT: 46 seconds — ABNORMAL HIGH (ref 24–36)

## 2016-03-03 LAB — PROTIME-INR
INR: 2.37
Prothrombin Time: 26.3 seconds — ABNORMAL HIGH (ref 11.4–15.2)

## 2016-03-03 LAB — TSH: TSH: 1.301 u[IU]/mL (ref 0.350–4.500)

## 2016-03-03 MED ORDER — SODIUM CHLORIDE 0.9 % IV BOLUS (SEPSIS)
500.0000 mL | Freq: Once | INTRAVENOUS | Status: AC
  Administered 2016-03-03: 500 mL via INTRAVENOUS

## 2016-03-03 MED ORDER — SODIUM CHLORIDE 0.9 % IV BOLUS (SEPSIS)
1000.0000 mL | Freq: Once | INTRAVENOUS | Status: AC
  Administered 2016-03-03: 1000 mL via INTRAVENOUS

## 2016-03-03 MED ORDER — DEXTROSE 5 % IV SOLN
1.0000 g | Freq: Once | INTRAVENOUS | Status: AC
  Administered 2016-03-03: 1 g via INTRAVENOUS
  Filled 2016-03-03: qty 10

## 2016-03-03 MED ORDER — ACETAMINOPHEN 650 MG RE SUPP
650.0000 mg | Freq: Once | RECTAL | Status: AC
  Administered 2016-03-03: 650 mg via RECTAL

## 2016-03-03 MED ORDER — ACETAMINOPHEN 650 MG RE SUPP: 650.0000 mg | Freq: Once | RECTAL | Status: DC

## 2016-03-03 NOTE — ED Notes (Signed)
Patient transported to CT 

## 2016-03-03 NOTE — H&P (Addendum)
History and Physical    Tracy Rodriguez [id number] DOB: November 15, 1931 DOA: 03/03/2016  Referring MD/NP/PA: Dr. Laneta Simmers PCP: [name], MD  Patient coming from:  [place] assisted [facility] facility  Chief Complaint:   HPI: Tracy Rodriguez is a 82 y.o. female with medical history significant of HTN, HLD, PAF, breast cancer s/p right mastectomy, CKD stage III, and bipolar disorder; who presents with acute altered mental status. Patient was  last seen normal around 5:30 PM when she last received her medication, altered acutely at 9 PM. Nursing home facility staff noted that patient had a fever and there is question about a possible episode of vomiting. Family is present at bedside and noted that the patient had a new cough over the last 2 days and has some lower extremity swelling. At baseline the patient is able to get up and ambulate with the assistance of a rolling walker and is usually able to maintain a conversation. They feel that the mother has some dementia, although not formally diagnosed. At baseline she does not require oxygen.  He states that just last month patient was hospitalized for hypertensive urgency and acute encephalopathy and was noted to have inability to urinate for which they had to place a Foley catheter. Foley catheter was in place until 12/4.  ED Course: Upon admission to the emergency department patient was seen to be febrile up to 105.38F rectally, respirations of 24, blood pressures as low as 99/48, oxygen saturation maintained on 2 L nasal cannula oxygen. Her initial UA was positive for signs of infection for which sepsis protocol was initiated patient was given 2500 mL of IVF and started on Rocephin IV. Chest x-ray revealed possibility of bilateral pleural effusions. Request ED physician BNP given cough and x-ray imaging. Patient was noted by family be more alert after initial resuscitative measures.  Review of Systems: As per HPI otherwise 10 point review of  systems negative.   Past Medical History:  Diagnosis Date  . Arthritis    "joints" (08/04/2014)  . Bipolar 1 disorder ([place])   . Cancer of right breast ([place])   . Chronic lower back pain   . Hyperlipemia   . Hypertension   . PAF (paroxysmal atrial fibrillation) ([place])     Past Surgical History:  Procedure Laterality Date  . BACK SURGERY    . BREAST BIOPSY Right   . BREAST RECONSTRUCTION Right   . CATARACT EXTRACTION W/ INTRAOCULAR LENS  IMPLANT, BILATERAL Bilateral   . EXCISIONAL HEMORRHOIDECTOMY    . [place]; 1973; 1985   "ruptured discs each time"  . MASTECTOMY Right    cancer  . PLACEMENT OF BREAST IMPLANTS Bilateral    "had to take tissue out of left"  . TONSILLECTOMY    . VAGINAL HYSTERECTOMY       reports that she has never smoked. She has never used smokeless tobacco. She reports that she does not drink alcohol or use drugs.  Allergies  Allergen Reactions  . Codeine Anaphylaxis and Nausea And Vomiting    Note: pt has tramadol for years with no reaction  . Morphine And Related Nausea And Vomiting    Severe nausea per daughter  . Valium [Diazepam] Shortness Of Breath  . Sulfa Antibiotics Itching and Rash  . Darvon [Propoxyphene] Nausea And Vomiting  . Floxin [Ofloxacin] Itching  . Levsin [Hyoscyamine Sulfate] Other (See Comments)    Unknown reaction  . Penicillins Itching    Itching reported by [facility]  Has patient had a PCN reaction causing immediate rash, facial/tongue/throat swelling, SOB or lightheadedness with hypotension: Yes Has patient had a PCN reaction causing severe rash involving mucus membranes or skin necrosis: No Has patient had a PCN reaction that required hospitalization No Has patient had a PCN reaction occurring within the last 10 years: No If all of the above answers are "NO", then may proceed with Cephalosporin use.   . Zantac [Ranitidine Hcl] Other (See Comments)    Unknown reaction    Family  History  Problem Relation Age of Onset  . Stroke Mother   . Heart disease Father   . Alzheimer's disease Sister   . Leukemia Brother   . Arthritis Maternal Grandmother   . Breast cancer Daughter     Prior to Admission medications   Medication Sig Start Date End Date Taking? Authorizing Provider  acetaminophen (TYLENOL) 325 MG tablet Take 2 tablets (650 mg total) by mouth every 6 (six) hours as needed for mild pain (or Fever >/= 101). Patient taking differently: Take 325 mg by mouth every 6 (six) hours as needed for mild pain (or Fever >/= 101).  02/12/16  Yes Bonnielee Haff, MD  atorvastatin (LIPITOR) 20 MG tablet Take 2 tablets (40 mg total) by mouth daily. Patient taking differently: Take 20 mg by mouth daily.  02/12/16  Yes Bonnielee Haff, MD  carvedilol (COREG) 6.25 MG tablet Take 1 tablet (6.25 mg total) by mouth 2 (two) times daily with a meal. 02/12/16  Yes Bonnielee Haff, MD  Dextromethorphan Polistirex (DELSYM PO) Take 10 mLs by mouth every 12 (twelve) hours as needed.   Yes Historical Provider, MD  digoxin (LANOXIN) 0.125 MG tablet Take 1 tablet (125 mcg total) by mouth daily. Patient taking differently: Take 0.125 mg by mouth daily.  01/02/16  Yes Marletta Lor, MD  diltiazem (DILACOR XR) 120 MG 24 hr capsule Take 120 mg by mouth daily.   Yes Historical Provider, MD  escitalopram (LEXAPRO) 10 MG tablet TAKE 1 TABLET(10 MG) BY MOUTH DAILY 02/08/16  Yes Marletta Lor, MD  ferrous sulfate 325 (65 FE) MG tablet Take 325 mg by mouth daily with breakfast.   Yes Historical Provider, MD  furosemide (LASIX) 40 MG tablet Take 0.5 tablets (20 mg total) by mouth daily. Patient taking differently: Take 40 mg by mouth daily.  02/12/16  Yes Bonnielee Haff, MD  gabapentin (NEURONTIN) 300 MG capsule Take 1 capsule (300 mg total) by mouth 2 (two) times daily. Patient taking differently: Take 300 mg by mouth 2 (two) times daily with a meal.  05/15/15  Yes Marletta Lor, MD  MYRBETRIQ  25 MG TB24 tablet TAKE 1 TABLET(25 MG) BY MOUTH DAILY 02/15/16  Yes Marletta Lor, MD  potassium chloride SA (K-DUR,KLOR-CON) 20 MEQ tablet Take 1 tablet (20 mEq total) by mouth daily. 02/12/16  Yes Bonnielee Haff, MD  QUEtiapine (SEROQUEL) 25 MG tablet Take 25 mg by mouth 2 (two) times daily with a meal.  02/15/16  Yes Historical Provider, MD  traMADol (ULTRAM) 50 MG tablet Take 1 tablet (50 mg total) by mouth every 6 (six) hours as needed for moderate pain. 02/12/16  Yes Bonnielee Haff, MD  traZODone (DESYREL) 150 MG tablet Take 150 mg by mouth at bedtime Patient taking differently: Take 150 mg by mouth at bedtime.  02/12/16  Yes Bonnielee Haff, MD  warfarin (COUMADIN) 4 MG tablet Take as directed by anticoagulation clinic. Patient taking differently: Take 2-4 mg by mouth See admin  instructions. Take 1/2 tablet (2 mg) by mouth on Monday and Friday morning, take 1 tablet (4 mg) on Sunday, Tuesday, Wednesday, Thursday, Saturday or as directed by anticoagulation clinic. 02/28/16  Yes Marletta Lor, MD    Physical Exam:    Constitutional: Elderly female who appears sick, but now able to follow commands. Vitals:   03/03/16 2200 03/03/16 2215 03/03/16 2230 03/03/16 2308  BP: (!) 128/116 115/55 (!) 99/48   Pulse: 77 70 69   Resp: 18 19 22    Temp:    98.8 F (37.1 C)  TempSrc:    Oral  SpO2: 100% 96% 96%   Weight:      Height:       Eyes: PERRL, lids and conjunctivae normal ENMT: Mucous membranes are dry. Posterior pharynx clear of any exudate or lesions.  Neck: normal, supple, no masses, no thyromegaly Respiratory: Mildly tachypnea with decreased breath sounds in b/l bases. No expiratory wheezes appreciated. Cardiovascular: Regular rate and rhythm, no murmurs / rubs / gallops. No extremity edema. 2+ pedal pulses. No carotid bruits.  Abdomen: no tenderness, no masses palpated. No hepatosplenomegaly. Bowel sounds positive.  Musculoskeletal: no clubbing / cyanosis. No joint deformity  upper and lower extremities. Good ROM, no contractures. Normal muscle tone.  Skin: no rashes, lesions, ulcers. No induration Neurologic: CN 2-12 grossly intact. Sensation intact, DTR normal. Strength 5/5 in all 4.  Psychiatric: . Alert and oriented x2. Normal mood.     Labs on Admission: I have personally reviewed following labs and imaging studies  CBC:  Recent Labs Lab 03/03/16 2142  WBC 11.2*  NEUTROABS 10.6*  HGB 10.9*  HCT 34.6*  MCV 85.6  PLT [id number]   Basic Metabolic Panel:  Recent Labs Lab 03/03/16 2142  NA 137  K 3.9  CL 106  CO2 24  GLUCOSE 143*  BUN 19  CREATININE 1.51*  CALCIUM 8.1*   GFR: Estimated Creatinine Clearance: 29.7 mL/min (by C-G formula based on SCr of 1.51 mg/dL (H)). Liver Function Tests:  Recent Labs Lab 03/03/16 2142  AST 28  ALT 21  ALKPHOS 64  BILITOT 0.7  PROT 6.4*  ALBUMIN 3.0*   No results for input(s): LIPASE, AMYLASE in the last 168 hours. No results for input(s): AMMONIA in the last 168 hours. Coagulation Profile:  Recent Labs Lab 02/28/16 03/03/16 2142  INR 4.3 2.37   Cardiac Enzymes:  Recent Labs Lab 03/03/16 2204  CKTOTAL 31*   BNP (last 3 results) No results for input(s): PROBNP in the last 8760 hours. HbA1C: No results for input(s): HGBA1C in the last 72 hours. CBG:  Recent Labs Lab 03/03/16 2134  GLUCAP 140*   Lipid Profile: No results for input(s): CHOL, HDL, LDLCALC, TRIG, CHOLHDL, LDLDIRECT in the last 72 hours. Thyroid Function Tests:  Recent Labs  03/03/16 2204  TSH 1.301   Anemia Panel: No results for input(s): VITAMINB12, FOLATE, FERRITIN, TIBC, IRON, RETICCTPCT in the last 72 hours. Urine analysis:    Component Value Date/Time   COLORURINE YELLOW 03/03/2016 2204   APPEARANCEUR HAZY (A) 03/03/2016 2204   LABSPEC 1.009 03/03/2016 2204   PHURINE 5.0 03/03/2016 2204   GLUCOSEU NEGATIVE 03/03/2016 2204   HGBUR LARGE (A) 03/03/2016 2204   BILIRUBINUR NEGATIVE 03/03/2016 2204    BILIRUBINUR n 07/03/2015 1457   KETONESUR NEGATIVE 03/03/2016 2204   PROTEINUR 100 (A) 03/03/2016 2204   UROBILINOGEN 1.0 07/03/2015 1457   UROBILINOGEN 0.2 08/04/2014 0657   NITRITE POSITIVE (A) 03/03/2016 2204   LEUKOCYTESUR  MODERATE (A) 03/03/2016 2204   Sepsis Labs: No results found for this or any previous visit (from the past 240 hour(s)).   Radiological Exams on Admission: Ct Head Wo Contrast  Result Date: 03/03/2016 CLINICAL DATA:  Altered mental status. EXAM: CT HEAD WITHOUT CONTRAST TECHNIQUE: Contiguous axial images were obtained from the base of the skull through the vertex without intravenous contrast. COMPARISON:  CT 02/07/2016, MRI 02/08/2016 FINDINGS: Brain: No evidence of acute infarction, hemorrhage, hydrocephalus, extra-axial collection or mass lesion/mass effect. Stable degree of atrophy and chronic small vessel ischemia. Vascular: Atherosclerosis of skullbase vasculature without hyperdense vessel or abnormal calcification. Skull: Normal. Negative for fracture or focal lesion. Sinuses/Orbits: Post bilateral cataract resection. Mild chronic mucosal thickening in the paranasal sinuses. No fluid levels. Mastoid air cells are well aerated. Other: None. IMPRESSION: No acute intracranial abnormality. Electronically Signed   By: Jeb Levering M.D.   On: 03/03/2016 23:10   Dg Chest Port 1 View  Result Date: 03/03/2016 CLINICAL DATA:  Altered mental status. EXAM: PORTABLE CHEST 1 VIEW COMPARISON:  Chest radiograph 02/07/2016 FINDINGS: Cardiomediastinal silhouette is enlarged. Mediastinal contours appear intact. Calcific atherosclerotic disease of the aorta noted. There is no evidence of pneumothorax. Low lung volumes with bibasilar atelectasis. Probable bilateral pleural effusions, greater on the left. Rounded density projecting over the lateral mid right thorax may represent loculated pleural effusion versus pulmonary or pleural based mass. Osseous structures are without acute  abnormality. Soft tissues are grossly normal. Breast implants are noted. IMPRESSION: Probable bilateral pleural effusions, greater on the left with bibasilar atelectasis. Rounded density projecting over the lateral mid right hemithorax may represent loculated pleural effusion versus pulmonary or pleural based mass. Cross-sectional imaging is recommended if further evaluation is clinically desired. Electronically Signed   By: Fidela Salisbury M.D.   On: 03/03/2016 22:23    EKG: Independently reviewed. Sinus rhythm  Assessment/Plan Sepsis 2/2 suspected UTI: Acute. Patient presents with rectal temperature to 105.43F, respirations up to 24, WBC 11.2, and lactic acid 1.32. Urinalysis was positive for signs of infection with rare bacteria, moderate leukocytes, positive nitrite, negative squamous cells, and TNTC WBCs. Patient was empirically given ceftriaxone and was given 2500 mL normal saline IV fluids. - Admit to a telemetry bed - Sepsis protocol initiated  - f/u blood, sputum,  & urine culture  - Changed to cefepime for possible healthcare associated UTI - Trend cardiac enzymes Addendum: 1:45am Due to patient again being found acutely lethargic and hard to arouse with soft blood pressures, abnormal chest x-ray with possible infiltrate, and elevated pro-calcitonin level antibiotic coverage was broadened to include vancomycin and azithromycin. Patient's bed status was changed from a telemetry bed to stepdown   Acute encephalopathy: Suspect to acute infection has patient appeared to improve initial resuscitative efforts including antibiotics - neuro checks - Check ammonia level - limiting sedating medications   Acute kidney injury on chronic kidney disease stage III: Patient's creatinine had previously been around 1.09 prior to discharge his 1 month ago. She presents with elevated creatinine of 1.51 and a BUN of 19. During her previous hospitalization she was also noted to have issues with urinary  retention/Foley catheter had to be placed. Question had symptoms secondary to dehydration, diuretics, or possibly CHF. - check FeUr - Repeat BMP in a.m.  Suspect diastolic CHF exacerbation: Patient notes lower extremity swelling and new cough. CXR abnormal with b/l pleural effusions. Last echocardiogram showed EF of 65-75% on 02/09/2016 - continuous pulse oximetry with nasal cannula oxygen to keep  O2 sats patient greater 92%. - checking BNP, f/u sputum studies - strict I&Os - If BNP elevated, will give gentle IV Lasix due to low blood pressure  Paroxysmal atrial fibrillation on anticoagulation: INR therapeutic at 2.37. Currently rate controlled at this time. Chads - Continue diltiazem, Coreg, digoxin, and warfarin per pharmacy once able  Bipolar disorder - Held Seroquel  Essential hypertension - Continue medications as noted above as tolerated  Hyperlipidemia - Continue atorvastatin  Anemia: Hemoglobin 10.9 stable - continue to monitor  DVT prophylaxis: lovenox  Code Status: DNR Family Communication: discussed plan of care with the  Disposition Plan: likely back to skilled Nursing facitlity once stable  Consults called: None Admission status: Inpatient  Norval Morton MD [facility] Pager [phone]  If 7PM-7AM, please contact night-coverage [email or web address] Password [facility]  03/03/2016, 11:28 PM

## 2016-03-03 NOTE — ED Provider Notes (Signed)
[facility] DEPT Provider Note   CSN: [id number] Arrival date & time: 03/03/16  2125     History   Chief Complaint Chief Complaint  Patient presents with  . Altered Mental Status    HPI Saline Demetrius is a 82 y.o. female.  The history is provided by the EMS personnel, the nursing home and a relative.  Altered Mental Status   This is a new problem. The current episode started 3 to 5 hours ago. The problem has not changed since onset.Associated symptoms include confusion and weakness. Risk factors: elderly. Her past medical history is significant for dementia (mild).    Past Medical History:  Diagnosis Date  . Arthritis    "joints" (08/04/2014)  . Bipolar 1 disorder ([place])   . Cancer of right breast ([place])   . Chronic lower back pain   . Hyperlipemia   . Hypertension   . PAF (paroxysmal atrial fibrillation) [facility])     Patient Active Problem List   Diagnosis Date Noted  . Acute renal failure superimposed on stage 3 chronic kidney disease ([place]) 02/08/2016  . Acute encephalopathy 02/07/2016  . Elevated troponin 02/07/2016  . Tremor of right hand 02/07/2016  . Delirium   . Fall   . Right knee injury   . Syncope 12/05/2015  . Physical deconditioning 12/05/2015  . Acute kidney injury ([place]) 12/05/2015  . Dehydration, moderate 12/05/2015  . Acute hypokalemia 12/05/2015  . Abnormal chest x-ray 12/05/2015  . Encounter for therapeutic drug monitoring 12/22/2014  . Atrial fibrillation with RVR ([place]) 08/04/2014  . Chronic atrial fibrillation ([place]) 08/04/2014  . Hypertensive urgency   . Obesity 09/27/2013  . Chronic back pain 09/14/2012  . Bipolar disorder ([place]) 09/14/2012    Past Surgical History:  Procedure Laterality Date  . BACK SURGERY    . BREAST BIOPSY Right   . BREAST RECONSTRUCTION Right   . CATARACT EXTRACTION W/ INTRAOCULAR LENS  IMPLANT, BILATERAL Bilateral   . EXCISIONAL HEMORRHOIDECTOMY    . [place]; 1973; 1985   "ruptured discs each  time"  . MASTECTOMY Right    cancer  . PLACEMENT OF BREAST IMPLANTS Bilateral    "had to take tissue out of left"  . TONSILLECTOMY    . VAGINAL HYSTERECTOMY      OB History    No data available       Home Medications    Prior to Admission medications   Medication Sig Start Date End Date Taking? Authorizing Provider  acetaminophen (TYLENOL) 325 MG tablet Take 2 tablets (650 mg total) by mouth every 6 (six) hours as needed for mild pain (or Fever >/= 101). Patient taking differently: Take 325 mg by mouth every 6 (six) hours as needed for mild pain (or Fever >/= 101).  02/12/16  Yes Bonnielee Haff, MD  atorvastatin (LIPITOR) 20 MG tablet Take 2 tablets (40 mg total) by mouth daily. Patient taking differently: Take 20 mg by mouth daily.  02/12/16  Yes Bonnielee Haff, MD  carvedilol (COREG) 6.25 MG tablet Take 1 tablet (6.25 mg total) by mouth 2 (two) times daily with a meal. 02/12/16  Yes Bonnielee Haff, MD  Dextromethorphan Polistirex (DELSYM PO) Take 10 mLs by mouth every 12 (twelve) hours as needed.   Yes Historical Provider, MD  digoxin (LANOXIN) 0.125 MG tablet Take 1 tablet (125 mcg total) by mouth daily. Patient taking differently: Take 0.125 mg by mouth daily.  01/02/16  Yes Marletta Lor, MD  diltiazem (DILACOR XR) 120  MG 24 hr capsule Take 120 mg by mouth daily.   Yes Historical Provider, MD  escitalopram (LEXAPRO) 10 MG tablet TAKE 1 TABLET(10 MG) BY MOUTH DAILY 02/08/16  Yes Marletta Lor, MD  ferrous sulfate 325 (65 FE) MG tablet Take 325 mg by mouth daily with breakfast.   Yes Historical Provider, MD  furosemide (LASIX) 40 MG tablet Take 0.5 tablets (20 mg total) by mouth daily. Patient taking differently: Take 40 mg by mouth daily.  02/12/16  Yes Bonnielee Haff, MD  gabapentin (NEURONTIN) 300 MG capsule Take 1 capsule (300 mg total) by mouth 2 (two) times daily. Patient taking differently: Take 300 mg by mouth 2 (two) times daily with a meal.  05/15/15  Yes Marletta Lor, MD  MYRBETRIQ 25 MG TB24 tablet TAKE 1 TABLET(25 MG) BY MOUTH DAILY 02/15/16  Yes Marletta Lor, MD  potassium chloride SA (K-DUR,KLOR-CON) 20 MEQ tablet Take 1 tablet (20 mEq total) by mouth daily. 02/12/16  Yes Bonnielee Haff, MD  QUEtiapine (SEROQUEL) 25 MG tablet Take 25 mg by mouth 2 (two) times daily with a meal.  02/15/16  Yes Historical Provider, MD  traMADol (ULTRAM) 50 MG tablet Take 1 tablet (50 mg total) by mouth every 6 (six) hours as needed for moderate pain. 02/12/16  Yes Bonnielee Haff, MD  traZODone (DESYREL) 150 MG tablet Take 150 mg by mouth at bedtime Patient taking differently: Take 150 mg by mouth at bedtime.  02/12/16  Yes Bonnielee Haff, MD  warfarin (COUMADIN) 4 MG tablet Take as directed by anticoagulation clinic. Patient taking differently: Take 2-4 mg by mouth See admin instructions. Take 1/2 tablet (2 mg) by mouth on Monday and Friday morning, take 1 tablet (4 mg) on Sunday, Tuesday, Wednesday, Thursday, Saturday or as directed by anticoagulation clinic. 02/28/16  Yes Marletta Lor, MD    Family History Family History  Problem Relation Age of Onset  . Stroke Mother   . Heart disease Father   . Alzheimer's disease Sister   . Leukemia Brother   . Arthritis Maternal Grandmother   . Breast cancer Daughter     Social History Social History  Substance Use Topics  . Smoking status: Never Smoker  . Smokeless tobacco: Never Used  . Alcohol use No     Allergies   Codeine; Morphine and related; Valium [diazepam]; Sulfa antibiotics; Darvon [propoxyphene]; Floxin [ofloxacin]; Levsin [hyoscyamine sulfate]; Penicillins; and Zantac [ranitidine hcl]   Review of Systems Review of Systems  Constitutional: Positive for fever (noted on arrival).  Neurological: Positive for weakness.  Psychiatric/Behavioral: Positive for confusion.  All other systems reviewed and are negative.    Physical Exam Updated Vital Signs BP (!) 106/50   Pulse 67    Temp 98.8 F (37.1 C) (Oral)   Resp 23   Ht 5\' 5"  (1.651 m)   Wt 172 lb (78 kg)   LMP 10/10/1969   SpO2 97%   BMI 28.62 kg/m   Physical Exam  Constitutional: She appears well-developed and well-nourished. No distress.  HENT:  Head: Normocephalic.  Nose: Nose normal.  Eyes: Conjunctivae are normal.  Neck: Neck supple. No tracheal deviation present.  Cardiovascular: Normal rate, regular rhythm and normal heart sounds.   Pulmonary/Chest: Effort normal and breath sounds normal. No respiratory distress.  Abdominal: Soft. She exhibits no distension. There is no tenderness. There is no rebound and no guarding.  Neurological: She is alert. She is disoriented (oriented to self and month, not year).  Skin:  Skin is warm and dry.  Psychiatric: She has a normal mood and affect.     ED Treatments / Results  Labs (all labs ordered are listed, but only abnormal results are displayed) Labs Reviewed  COMPREHENSIVE METABOLIC PANEL - Abnormal; Notable for the following:       Result Value   Glucose, Bld 143 (*)    Creatinine, Ser 1.51 (*)    Calcium 8.1 (*)    Total Protein 6.4 (*)    Albumin 3.0 (*)    GFR calc non Af Amer 31 (*)    GFR calc Af Amer 36 (*)    All other components within normal limits  CBC WITH DIFFERENTIAL/PLATELET - Abnormal; Notable for the following:    WBC 11.2 (*)    Hemoglobin 10.9 (*)    HCT 34.6 (*)    RDW 20.1 (*)    Neutro Abs 10.6 (*)    Lymphs Abs 0.4 (*)    All other components within normal limits  PROTIME-INR - Abnormal; Notable for the following:    Prothrombin Time 26.3 (*)    All other components within normal limits  APTT - Abnormal; Notable for the following:    aPTT 46 (*)    All other components within normal limits  URINALYSIS, ROUTINE W REFLEX MICROSCOPIC - Abnormal; Notable for the following:    APPearance HAZY (*)    Hgb urine dipstick LARGE (*)    Protein, ur 100 (*)    Nitrite POSITIVE (*)    Leukocytes, UA MODERATE (*)     Bacteria, UA RARE (*)    All other components within normal limits  CK - Abnormal; Notable for the following:    Total CK 31 (*)    All other components within normal limits  CBG MONITORING, ED - Abnormal; Notable for the following:    Glucose-Capillary 140 (*)    All other components within normal limits  CULTURE, BLOOD (ROUTINE X 2)  CULTURE, BLOOD (ROUTINE X 2)  URINE CULTURE  TSH  BRAIN NATRIURETIC PEPTIDE  I-STAT CG4 LACTIC ACID, ED  I-STAT TROPOININ, ED    EKG  EKG Interpretation  Date/Time:  Sunday March 03 2016 21:37:59 EST Ventricular Rate:  78 PR Interval:    QRS Duration: 88 QT Interval:  373 QTC Calculation: 425 R Axis:   -16 Text Interpretation:  Sinus rhythm Borderline left axis deviation Baseline wander Confirmed by Durene Dodge MD, Sriram Febles [phone]) on 03/03/2016 9:50:18 PM       Radiology Ct Head Wo Contrast  Result Date: 03/03/2016 CLINICAL DATA:  Altered mental status. EXAM: CT HEAD WITHOUT CONTRAST TECHNIQUE: Contiguous axial images were obtained from the base of the skull through the vertex without intravenous contrast. COMPARISON:  CT 02/07/2016, MRI 02/08/2016 FINDINGS: Brain: No evidence of acute infarction, hemorrhage, hydrocephalus, extra-axial collection or mass lesion/mass effect. Stable degree of atrophy and chronic small vessel ischemia. Vascular: Atherosclerosis of skullbase vasculature without hyperdense vessel or abnormal calcification. Skull: Normal. Negative for fracture or focal lesion. Sinuses/Orbits: Post bilateral cataract resection. Mild chronic mucosal thickening in the paranasal sinuses. No fluid levels. Mastoid air cells are well aerated. Other: None. IMPRESSION: No acute intracranial abnormality. Electronically Signed   By: Jeb Levering M.D.   On: 03/03/2016 23:10   Dg Chest Port 1 View  Result Date: 03/03/2016 CLINICAL DATA:  Altered mental status. EXAM: PORTABLE CHEST 1 VIEW COMPARISON:  Chest radiograph 02/07/2016 FINDINGS:  Cardiomediastinal silhouette is enlarged. Mediastinal contours appear intact. Calcific atherosclerotic disease of  the aorta noted. There is no evidence of pneumothorax. Low lung volumes with bibasilar atelectasis. Probable bilateral pleural effusions, greater on the left. Rounded density projecting over the lateral mid right thorax may represent loculated pleural effusion versus pulmonary or pleural based mass. Osseous structures are without acute abnormality. Soft tissues are grossly normal. Breast implants are noted. IMPRESSION: Probable bilateral pleural effusions, greater on the left with bibasilar atelectasis. Rounded density projecting over the lateral mid right hemithorax may represent loculated pleural effusion versus pulmonary or pleural based mass. Cross-sectional imaging is recommended if further evaluation is clinically desired. Electronically Signed   By: Fidela Salisbury M.D.   On: 03/03/2016 22:23    Procedures Procedures (including critical care time)  Medications Ordered in ED Medications  acetaminophen (TYLENOL) suppository 650 mg (650 mg Rectal Given 03/03/16 2203)  sodium chloride 0.9 % bolus 1,000 mL (1,000 mLs Intravenous New Bag/Given 03/03/16 2235)    And  sodium chloride 0.9 % bolus 1,000 mL (1,000 mLs Intravenous New Bag/Given 03/03/16 2154)    And  sodium chloride 0.9 % bolus 500 mL (500 mLs Intravenous New Bag/Given 03/03/16 2235)  cefTRIAXone (ROCEPHIN) 1 g in dextrose 5 % 50 mL IVPB (1 g Intravenous New Bag/Given 03/03/16 2305)     Initial Impression / Assessment and Plan / ED Course  I have reviewed the triage vital signs and the nursing notes.  Pertinent labs & imaging results that were available during my care of the patient were reviewed by me and considered in my medical decision making (see chart for details).  Clinical Course     82 y.o. female presents with AMS noted by independent living facility where she was found altered, warm and weak. She is  febrile to 105. Sepsis workup initiated, heavy pyuria suggests UTI as source of encephalopathy and fever. No tachycardia or other SIRS currently, covered with rocephin. Family notes fall 3 days ago and Pt is anticoagulated, CT head ordered but no bleeding or other abnormality noted. Hospitalist was consulted for admission and will see the patient in the emergency department.   Final Clinical Impressions(s) / ED Diagnoses   Final diagnoses:  Urinary tract infection with hematuria, site unspecified  Encephalopathy    New Prescriptions New Prescriptions   No medications on file     Leo Grosser, MD 03/04/16 [phone]

## 2016-03-03 NOTE — ED Triage Notes (Signed)
Pt brought to ED by [facility] from [facility] assisting living, SNF staff reported pt last seen normal today at 1830 pm when she got her medication found with AMS at 2100 no verbal, per snf staff pt is usually able to communicate and move with out assistance.

## 2016-03-04 ENCOUNTER — Inpatient Hospital Stay (HOSPITAL_COMMUNITY): Payer: Medicare Other

## 2016-03-04 DIAGNOSIS — Z7901 Long term (current) use of anticoagulants: Secondary | ICD-10-CM | POA: Diagnosis not present

## 2016-03-04 DIAGNOSIS — Z66 Do not resuscitate: Secondary | ICD-10-CM | POA: Diagnosis present

## 2016-03-04 DIAGNOSIS — N183 Chronic kidney disease, stage 3 (moderate): Secondary | ICD-10-CM | POA: Diagnosis not present

## 2016-03-04 DIAGNOSIS — I9589 Other hypotension: Secondary | ICD-10-CM | POA: Diagnosis not present

## 2016-03-04 DIAGNOSIS — J810 Acute pulmonary edema: Secondary | ICD-10-CM | POA: Diagnosis not present

## 2016-03-04 DIAGNOSIS — A4151 Sepsis due to Escherichia coli [E. coli]: Secondary | ICD-10-CM | POA: Diagnosis not present

## 2016-03-04 DIAGNOSIS — B9620 Unspecified Escherichia coli [E. coli] as the cause of diseases classified elsewhere: Secondary | ICD-10-CM | POA: Diagnosis not present

## 2016-03-04 DIAGNOSIS — M6281 Muscle weakness (generalized): Secondary | ICD-10-CM | POA: Diagnosis not present

## 2016-03-04 DIAGNOSIS — I130 Hypertensive heart and chronic kidney disease with heart failure and stage 1 through stage 4 chronic kidney disease, or unspecified chronic kidney disease: Secondary | ICD-10-CM | POA: Diagnosis present

## 2016-03-04 DIAGNOSIS — Z90710 Acquired absence of both cervix and uterus: Secondary | ICD-10-CM | POA: Diagnosis not present

## 2016-03-04 DIAGNOSIS — J189 Pneumonia, unspecified organism: Secondary | ICD-10-CM | POA: Diagnosis not present

## 2016-03-04 DIAGNOSIS — R0902 Hypoxemia: Secondary | ICD-10-CM | POA: Diagnosis not present

## 2016-03-04 DIAGNOSIS — D649 Anemia, unspecified: Secondary | ICD-10-CM | POA: Diagnosis present

## 2016-03-04 DIAGNOSIS — Z820 Family history of epilepsy and other diseases of the nervous system: Secondary | ICD-10-CM | POA: Diagnosis not present

## 2016-03-04 DIAGNOSIS — A419 Sepsis, unspecified organism: Secondary | ICD-10-CM | POA: Diagnosis not present

## 2016-03-04 DIAGNOSIS — J90 Pleural effusion, not elsewhere classified: Secondary | ICD-10-CM | POA: Diagnosis not present

## 2016-03-04 DIAGNOSIS — M6289 Other specified disorders of muscle: Secondary | ICD-10-CM | POA: Diagnosis not present

## 2016-03-04 DIAGNOSIS — F05 Delirium due to known physiological condition: Secondary | ICD-10-CM | POA: Diagnosis present

## 2016-03-04 DIAGNOSIS — R6520 Severe sepsis without septic shock: Secondary | ICD-10-CM | POA: Diagnosis not present

## 2016-03-04 DIAGNOSIS — Z823 Family history of stroke: Secondary | ICD-10-CM | POA: Diagnosis not present

## 2016-03-04 DIAGNOSIS — I509 Heart failure, unspecified: Secondary | ICD-10-CM

## 2016-03-04 DIAGNOSIS — R41841 Cognitive communication deficit: Secondary | ICD-10-CM | POA: Diagnosis not present

## 2016-03-04 DIAGNOSIS — F319 Bipolar disorder, unspecified: Secondary | ICD-10-CM | POA: Diagnosis present

## 2016-03-04 DIAGNOSIS — N170 Acute kidney failure with tubular necrosis: Secondary | ICD-10-CM | POA: Diagnosis present

## 2016-03-04 DIAGNOSIS — R4182 Altered mental status, unspecified: Secondary | ICD-10-CM | POA: Diagnosis not present

## 2016-03-04 DIAGNOSIS — Z79899 Other long term (current) drug therapy: Secondary | ICD-10-CM | POA: Diagnosis not present

## 2016-03-04 DIAGNOSIS — R2689 Other abnormalities of gait and mobility: Secondary | ICD-10-CM | POA: Diagnosis not present

## 2016-03-04 DIAGNOSIS — J9690 Respiratory failure, unspecified, unspecified whether with hypoxia or hypercapnia: Secondary | ICD-10-CM | POA: Diagnosis not present

## 2016-03-04 DIAGNOSIS — G9340 Encephalopathy, unspecified: Secondary | ICD-10-CM | POA: Diagnosis present

## 2016-03-04 DIAGNOSIS — I480 Paroxysmal atrial fibrillation: Secondary | ICD-10-CM | POA: Diagnosis not present

## 2016-03-04 DIAGNOSIS — N390 Urinary tract infection, site not specified: Secondary | ICD-10-CM | POA: Diagnosis present

## 2016-03-04 DIAGNOSIS — I712 Thoracic aortic aneurysm, without rupture: Secondary | ICD-10-CM | POA: Diagnosis present

## 2016-03-04 DIAGNOSIS — N179 Acute kidney failure, unspecified: Secondary | ICD-10-CM | POA: Diagnosis not present

## 2016-03-04 DIAGNOSIS — N3000 Acute cystitis without hematuria: Secondary | ICD-10-CM | POA: Diagnosis not present

## 2016-03-04 DIAGNOSIS — E785 Hyperlipidemia, unspecified: Secondary | ICD-10-CM | POA: Diagnosis present

## 2016-03-04 DIAGNOSIS — F0390 Unspecified dementia without behavioral disturbance: Secondary | ICD-10-CM | POA: Diagnosis present

## 2016-03-04 DIAGNOSIS — R918 Other nonspecific abnormal finding of lung field: Secondary | ICD-10-CM | POA: Diagnosis not present

## 2016-03-04 DIAGNOSIS — I5033 Acute on chronic diastolic (congestive) heart failure: Secondary | ICD-10-CM | POA: Diagnosis not present

## 2016-03-04 DIAGNOSIS — Z853 Personal history of malignant neoplasm of breast: Secondary | ICD-10-CM | POA: Diagnosis not present

## 2016-03-04 DIAGNOSIS — Z9011 Acquired absence of right breast and nipple: Secondary | ICD-10-CM | POA: Diagnosis not present

## 2016-03-04 DIAGNOSIS — Z8249 Family history of ischemic heart disease and other diseases of the circulatory system: Secondary | ICD-10-CM | POA: Diagnosis not present

## 2016-03-04 DIAGNOSIS — R938 Abnormal findings on diagnostic imaging of other specified body structures: Secondary | ICD-10-CM | POA: Diagnosis not present

## 2016-03-04 LAB — I-STAT ARTERIAL BLOOD GAS, ED
Acid-base deficit: 3 mmol/L — ABNORMAL HIGH (ref 0.0–2.0)
Bicarbonate: 22.8 mmol/L (ref 20.0–28.0)
O2 Saturation: 98 %
Patient temperature: 98.8
TCO2: 24 mmol/L (ref 0–100)
pCO2 arterial: 42.5 mmHg (ref 32.0–48.0)
pH, Arterial: 7.338 — ABNORMAL LOW (ref 7.350–7.450)
pO2, Arterial: 107 mmHg (ref 83.0–108.0)

## 2016-03-04 LAB — BRAIN NATRIURETIC PEPTIDE: B Natriuretic Peptide: 420.9 pg/mL — ABNORMAL HIGH (ref 0.0–100.0)

## 2016-03-04 LAB — LACTIC ACID, PLASMA
Lactic Acid, Venous: 0.8 mmol/L (ref 0.5–1.9)
Lactic Acid, Venous: 0.7 mmol/L (ref 0.5–1.9)

## 2016-03-04 LAB — HEMOGLOBIN AND HEMATOCRIT, BLOOD
HCT: 29.7 % — ABNORMAL LOW (ref 36.0–46.0)
Hemoglobin: 9.5 g/dL — ABNORMAL LOW (ref 12.0–15.0)

## 2016-03-04 LAB — MRSA PCR SCREENING: MRSA by PCR: NEGATIVE

## 2016-03-04 LAB — CBC
HCT: 29.6 % — ABNORMAL LOW (ref 36.0–46.0)
Hemoglobin: 9.2 g/dL — ABNORMAL LOW (ref 12.0–15.0)
MCH: 26.3 pg (ref 26.0–34.0)
MCHC: 31.1 g/dL (ref 30.0–36.0)
MCV: 84.6 fL (ref 78.0–100.0)
Platelets: 156 10*3/uL (ref 150–400)
RBC: 3.5 MIL/uL — ABNORMAL LOW (ref 3.87–5.11)
RDW: 19.9 % — ABNORMAL HIGH (ref 11.5–15.5)
WBC: 14.9 10*3/uL — ABNORMAL HIGH (ref 4.0–10.5)

## 2016-03-04 LAB — PROCALCITONIN: Procalcitonin: 19.32 ng/mL

## 2016-03-04 LAB — TROPONIN I
Troponin I: 0.33 ng/mL (ref <0.03)
Troponin I: 0.29 ng/mL (ref <0.03)
Troponin I: 0.17 ng/mL (ref <0.03)
Troponin I: 0.12 ng/mL (ref <0.03)

## 2016-03-04 LAB — GLUCOSE, CAPILLARY: Glucose-Capillary: 210 mg/dL — ABNORMAL HIGH (ref 65–99)

## 2016-03-04 LAB — PROTIME-INR
INR: 2.34
Prothrombin Time: 26.1 seconds — ABNORMAL HIGH (ref 11.4–15.2)

## 2016-03-04 LAB — I-STAT CG4 LACTIC ACID, ED: Lactic Acid, Venous: 0.69 mmol/L (ref 0.5–1.9)

## 2016-03-04 LAB — AMMONIA: Ammonia: 24 umol/L (ref 9–35)

## 2016-03-04 LAB — CBG MONITORING, ED: Glucose-Capillary: 137 mg/dL — ABNORMAL HIGH (ref 65–99)

## 2016-03-04 LAB — CREATININE, URINE, RANDOM: Creatinine, Urine: 97.1 mg/dL

## 2016-03-04 LAB — DIGOXIN LEVEL: Digoxin Level: 1.5 ng/mL (ref 0.8–2.0)

## 2016-03-04 LAB — STREP PNEUMONIAE URINARY ANTIGEN: Strep Pneumo Urinary Antigen: NEGATIVE

## 2016-03-04 MED ORDER — WARFARIN SODIUM 2 MG PO TABS
2.0000 mg | ORAL_TABLET | ORAL | Status: DC
  Administered 2016-03-04: 2 mg via ORAL
  Filled 2016-03-04: qty 1

## 2016-03-04 MED ORDER — POTASSIUM CHLORIDE CRYS ER 20 MEQ PO TBCR
20.0000 meq | EXTENDED_RELEASE_TABLET | Freq: Every day | ORAL | Status: DC
  Administered 2016-03-04 – 2016-03-06 (×3): 20 meq via ORAL
  Filled 2016-03-04 (×3): qty 1

## 2016-03-04 MED ORDER — ONDANSETRON HCL 4 MG PO TABS: 4.0000 mg | ORAL_TABLET | Freq: Four times a day (QID) | ORAL | Status: DC | PRN

## 2016-03-04 MED ORDER — DILTIAZEM HCL ER COATED BEADS 120 MG PO CP24: 120.0000 mg | ORAL_CAPSULE | Freq: Every day | ORAL | Status: DC

## 2016-03-04 MED ORDER — WARFARIN SODIUM 4 MG PO TABS
4.0000 mg | ORAL_TABLET | ORAL | Status: DC
  Administered 2016-03-05 – 2016-03-06 (×2): 4 mg via ORAL
  Filled 2016-03-04 (×2): qty 1

## 2016-03-04 MED ORDER — FUROSEMIDE 10 MG/ML IJ SOLN: 40.0000 mg | Freq: Once | INTRAMUSCULAR | Status: DC

## 2016-03-04 MED ORDER — FUROSEMIDE 10 MG/ML IJ SOLN
20.0000 mg | Freq: Two times a day (BID) | INTRAMUSCULAR | Status: DC
  Administered 2016-03-04 (×2): 20 mg via INTRAVENOUS
  Filled 2016-03-04 (×2): qty 2

## 2016-03-04 MED ORDER — WARFARIN - PHARMACIST DOSING INPATIENT
Freq: Every day | Status: DC
  Administered 2016-03-06: 18:00:00

## 2016-03-04 MED ORDER — CARVEDILOL 6.25 MG PO TABS: 6.2500 mg | ORAL_TABLET | Freq: Two times a day (BID) | ORAL | Status: DC

## 2016-03-04 MED ORDER — CEFEPIME HCL 2 G IJ SOLR
2.0000 g | INTRAMUSCULAR | Status: DC
  Administered 2016-03-05: 2 g via INTRAVENOUS
  Filled 2016-03-04: qty 2

## 2016-03-04 MED ORDER — SODIUM CHLORIDE 0.9 % IV SOLN
INTRAVENOUS | Status: DC
  Administered 2016-03-04 (×2): via INTRAVENOUS

## 2016-03-04 MED ORDER — SODIUM CHLORIDE 0.9 % IV BOLUS (SEPSIS)
500.0000 mL | Freq: Once | INTRAVENOUS | Status: AC
  Administered 2016-03-04: 500 mL via INTRAVENOUS

## 2016-03-04 MED ORDER — DEXTROSE 5 % IV SOLN
2.0000 g | INTRAVENOUS | Status: DC
  Administered 2016-03-04: 2 g via INTRAVENOUS
  Filled 2016-03-04: qty 2

## 2016-03-04 MED ORDER — ALBUTEROL SULFATE (2.5 MG/3ML) 0.083% IN NEBU: 2.5000 mg | INHALATION_SOLUTION | RESPIRATORY_TRACT | Status: DC | PRN

## 2016-03-04 MED ORDER — VANCOMYCIN HCL IN DEXTROSE 750-5 MG/150ML-% IV SOLN
750.0000 mg | INTRAVENOUS | Status: DC
  Administered 2016-03-05 – 2016-03-06 (×2): 750 mg via INTRAVENOUS
  Filled 2016-03-04 (×2): qty 150

## 2016-03-04 MED ORDER — ORAL CARE MOUTH RINSE
15.0000 mL | Freq: Two times a day (BID) | OROMUCOSAL | Status: DC
  Administered 2016-03-04 – 2016-03-08 (×7): 15 mL via OROMUCOSAL

## 2016-03-04 MED ORDER — DEXTROSE 5 % IV SOLN
500.0000 mg | INTRAVENOUS | Status: AC
  Administered 2016-03-04: 500 mg via INTRAVENOUS
  Filled 2016-03-04: qty 500

## 2016-03-04 MED ORDER — ACETAMINOPHEN 325 MG PO TABS
650.0000 mg | ORAL_TABLET | Freq: Four times a day (QID) | ORAL | Status: DC | PRN
  Administered 2016-03-05 – 2016-03-06 (×3): 650 mg via ORAL
  Filled 2016-03-04 (×3): qty 2

## 2016-03-04 MED ORDER — ACETAMINOPHEN 650 MG RE SUPP: 650.0000 mg | Freq: Four times a day (QID) | RECTAL | Status: DC | PRN

## 2016-03-04 MED ORDER — DIGOXIN 125 MCG PO TABS: 0.1250 mg | ORAL_TABLET | Freq: Every day | ORAL | Status: DC

## 2016-03-04 MED ORDER — ATORVASTATIN CALCIUM 20 MG PO TABS
20.0000 mg | ORAL_TABLET | Freq: Every day | ORAL | Status: DC
  Administered 2016-03-04 – 2016-03-09 (×6): 20 mg via ORAL
  Filled 2016-03-04 (×6): qty 1

## 2016-03-04 MED ORDER — QUETIAPINE FUMARATE 25 MG PO TABS: 25.0000 mg | ORAL_TABLET | Freq: Two times a day (BID) | ORAL | Status: DC

## 2016-03-04 MED ORDER — FERROUS SULFATE 325 (65 FE) MG PO TABS
325.0000 mg | ORAL_TABLET | Freq: Every day | ORAL | Status: DC
  Administered 2016-03-04 – 2016-03-09 (×6): 325 mg via ORAL
  Filled 2016-03-04 (×6): qty 1

## 2016-03-04 MED ORDER — DEXTROSE 5 % IV SOLN
500.0000 mg | INTRAVENOUS | Status: DC
  Administered 2016-03-05 – 2016-03-06 (×2): 500 mg via INTRAVENOUS
  Filled 2016-03-04 (×2): qty 500

## 2016-03-04 MED ORDER — VANCOMYCIN HCL IN DEXTROSE 1-5 GM/200ML-% IV SOLN
1000.0000 mg | INTRAVENOUS | Status: AC
  Administered 2016-03-04: 1000 mg via INTRAVENOUS
  Filled 2016-03-04: qty 200

## 2016-03-04 MED ORDER — SODIUM CHLORIDE 0.9 % IV BOLUS (SEPSIS)
250.0000 mL | Freq: Once | INTRAVENOUS | Status: AC
  Administered 2016-03-04: 250 mL via INTRAVENOUS

## 2016-03-04 MED ORDER — ONDANSETRON HCL 4 MG/2ML IJ SOLN: 4.0000 mg | Freq: Four times a day (QID) | INTRAMUSCULAR | Status: DC | PRN

## 2016-03-04 NOTE — Progress Notes (Signed)
Attempt report x1  

## 2016-03-04 NOTE — Progress Notes (Signed)
PHARMACY CONSULT NOTE - Initial Consult  Pharmacy Consult for Warfarin  Indication: atrial fibrillation   Pharmacy Consult for Vancomycin and Cefepime  Indication: sepsis   Allergies  Allergen Reactions  . Codeine Anaphylaxis and Nausea And Vomiting    Note: pt has tramadol for years with no reaction  . Morphine And Related Nausea And Vomiting    Severe nausea per daughter  . Valium [Diazepam] Shortness Of Breath  . Sulfa Antibiotics Itching and Rash  . Darvon [Propoxyphene] Nausea And Vomiting  . Floxin [Ofloxacin] Itching  . Levsin [Hyoscyamine Sulfate] Other (See Comments)    Unknown reaction  . Penicillins Itching    Itching reported by [facility] Has patient had a PCN reaction causing immediate rash, facial/tongue/throat swelling, SOB or lightheadedness with hypotension: Yes Has patient had a PCN reaction causing severe rash involving mucus membranes or skin necrosis: No Has patient had a PCN reaction that required hospitalization No Has patient had a PCN reaction occurring within the last 10 years: No If all of the above answers are "NO", then may proceed with Cephalosporin use.   . Zantac [Ranitidine Hcl] Other (See Comments)    Unknown reaction    Patient Measurements: Height: 5\' 9"  (175.3 cm) (per grandson) Weight: 170 lb 13.7 oz (77.5 kg) IBW/kg (Calculated) : 66.2  Vital Signs: Temp: 97.3 F (36.3 C) (12/25 0710) Temp Source: Axillary (12/25 0710) BP: 92/48 (12/25 0710) Pulse Rate: 48 (12/25 0710)  Labs:  Recent Labs  03/03/16 2142 03/03/16 2204 03/04/16 0211 03/04/16 0520 03/04/16 0758  HGB 10.9*  --  9.2*  --   --   HCT 34.6*  --  29.6*  --   --   PLT 168  --  156  --   --   APTT 46*  --   --   --   --   LABPROT 26.3*  --  26.1*  --   --   INR 2.37  --  2.34  --   --   CREATININE 1.51*  --   --   --   --   CKTOTAL  --  31*  --   --   --   TROPONINI  --   --   --  0.33* 0.29*    Estimated Creatinine Clearance: 30  mL/min (by C-G formula based on SCr of 1.51 mg/dL (H)).   Medical History: Past Medical History:  Diagnosis Date  . Arthritis    "joints" (08/04/2014)  . Bipolar 1 disorder ([place])   . Cancer of right breast ([place])   . Chronic lower back pain   . Hyperlipemia   . Hypertension   . PAF (paroxysmal atrial fibrillation) [facility])    Assessment: 82 y/o F presents to the ED with altered mental status and suspected UTI. Patient takes warfarin PTA for afib and is to continue inpatient. Pharmacy to dose. INR is therapeutic at 2.37 on admission. Hgb down slightly, from 10.9 to 9.2, though could be dilutional. Warfarin PTA dosing is 2 mg on Mon/Fri and 4 mg all other days. No overt s/s bleeding noted.   Patient with pyuria per ED provider, WBC 14.9 and temp on admission 105.6. PCt also elevated at 19.32. Patient originally on cefepime for UTI, but has since had worsening hypotension and lethargy. Antibiotics broadened to cefepime + vancomycin + azithromycin.   Of note, watch INR as azithromycin may increase INR d/t drug interaction with warfarin.   Goal of Therapy:  INR 2-3  Monitor platelets by anticoagulation protocol: Yes   Plan (Warfarin):  Warfarin 2 mg Mon/Fri, 4 mg all other days Daily PT/INR CBC as needed  Monitor for bleeding Monitor INR closely d/t drug interaction with azithromycin   Plan (Vanc + Cefepime):  Continue Cefepime 2g IV q24hr  Start Vancomycin 750 mg IV q24hr  Azithromycin 500 mg IV q24hr  Follow cultures, renal function, clinical picture, and LOT  Adjust as needed for renal function changes  Vancomycin trough at steady state and PRN   Argie Ramming, PharmD Pharmacy Resident  Pager [phone] 03/04/16 9:52 AM

## 2016-03-04 NOTE — Progress Notes (Signed)
Night float  Called by rn re: hypotension, bradycardia early this am, maps mid 50s, pt lethargic, minimally arousable to pain  - bolus 250cc ordered - ordered dig level  640am, on arrival , pt just finished bolus, bp 82/45 map again in mid 50s. Lethargic, arounsable to painful stimuli, startles but than doses off again. Unable to follow commands.  Dry mmm No jvp No crackles on lungs, diminished bases abd soft Le no edema  Hypotension - from sepsis / uti source? Dig level still pending Holding parameters on bp meds Another 500cc bolus now. - chk cxr.  Rounded w/ rn dnr

## 2016-03-04 NOTE — ED Notes (Signed)
Report attempted x 2, report to be given at the bedside.

## 2016-03-04 NOTE — Progress Notes (Signed)
PROGRESS NOTE                                                                                                                                                                                                             Patient Demographics:    Tracy Rodriguez, is a 82 y.o. female, DOB - 1931/06/07, [id number]  Admit date - 03/03/2016   Admitting Physician Norval Morton, MD  Outpatient Primary MD for the patient is [name], MD  LOS - 0  Chief Complaint  Patient presents with  . Altered Mental Status       Brief Narrative   82 y.o. female with medical history significant of HTN, HLD, PAF, breast cancer s/p right mastectomy, CKD stage III, and bipolar disorder; who presents with acute altered mental status, Workup significant for sepsis secondary to UTI and pneumonia.   Subjective:    [place] today has, No headache, No chest pain, No abdominal pain - No Nausea, Report generalized weakness, but reports she is already feeling better, reported cough, denies any urinary symptoms.  Assessment  & Plan :    Principal Problem:   Sepsis ([place]) Active Problems:   Bipolar disorder ([place])   Paroxysmal atrial fibrillation ([place])   Acute kidney injury ([place])   Abnormal chest x-ray   Acute renal failure superimposed on stage 3 chronic kidney disease (HCC)   CHF (congestive heart failure) (HCC)   Anemia   Sepsis  - Continue suspected UTI and CAP , he presents with a rectal temperature of 105.6 , respirations up to 24, WBC 11.2, and lactic acid 1.32. With altered mental status . - Workup significant for positive urinalysis, she reports cough, and possible new left upper lung pneumonia . - Sepsis protocol initiated , hypotensive requiring multiple fluid boluses, appears to be improving, hold antihypertensive medication and Lasix, if recurrence of hypotension will consider stress dose steroids/ - f/u blood, sputum,  & urine culture  -  Continue with IV vancomycin, cefepime and azithromycin  UTI - Continue with IV cefepime, follow urine cultures   CAP - Chest x-ray with right upper lung opacity, check CT chest with contrast for further evaluation, continue with broad-spectrum antibiotic coverage.  Elevated troponins - She denies any chest pain, non-ACS pattern, most likely related to sepsis and hypotension  Acute kidney injury on chronic kidney  disease stage III:  - Obtain baseline around 1.09 , elevated creatinine on admission  - Continue gentle hydration  Suspect diastolic CHF exacerbation:  - Patient notes lower extremity swelling and new cough. CXR abnormal with b/l pleural effusions. Last echocardiogram showed EF of 65-75% on 02/09/2016 - continuous pulse oximetry with nasal cannula oxygen to keep O2 sats patient greater 92%. - checking BNP, f/u sputum studies - strict I&Os - If BNP elevated, will give gentle IV Lasix due to low blood pressure  Paroxysmal atrial fibrillation on anticoagulation: - INR therapeutic at 2.37. Currently rate controlled at this time.  - Continue  digoxin, and warfarin per pharmacy once able - Hold Cardizem and metoprolol secondary to hypotension  Bipolar disorder - Held Seroquel  Essential hypertension - Agent is hypotensive, responding to fluid bolus, continue to hold all antihypertensive medication  Hyperlipidemia - Continue atorvastatin  Anemia:  - Hemoglobin 10.9 stable - continue to monitor    Code Status : DO NOT RESUSCITATE  Family Communication  : None at bedside  Disposition Plan  : We'll consult PT when stable  Consults  :  None  Procedures  : none  DVT Prophylaxis  : On warfarin  Lab Results  Component Value Date   PLT 156 03/04/2016    Antibiotics  :    Anti-infectives    Start     Dose/Rate Route Frequency Ordered Stop   03/05/16 0500  azithromycin (ZITHROMAX) 500 mg in dextrose 5 % 250 mL IVPB     500 mg 250 mL/hr over 60 Minutes  Intravenous Every 24 hours 03/04/16 1002     03/05/16 0500  vancomycin (VANCOCIN) IVPB 750 mg/150 ml premix     750 mg 150 mL/hr over 60 Minutes Intravenous Every 24 hours 03/04/16 1013     03/05/16 0500  ceFEPIme (MAXIPIME) 2 g in dextrose 5 % 50 mL IVPB     2 g 100 mL/hr over 30 Minutes Intravenous Every 24 hours 03/04/16 1014     03/04/16 0300  vancomycin (VANCOCIN) IVPB 1000 mg/200 mL premix     1,000 mg 200 mL/hr over 60 Minutes Intravenous STAT 03/04/16 0203 03/04/16 0527   03/04/16 0230  azithromycin (ZITHROMAX) 500 mg in dextrose 5 % 250 mL IVPB     500 mg 250 mL/hr over 60 Minutes Intravenous STAT 03/04/16 0204 03/04/16 0528   03/04/16 0115  ceFEPIme (MAXIPIME) 2 g in dextrose 5 % 50 mL IVPB  Status:  Discontinued     2 g 100 mL/hr over 30 Minutes Intravenous Every 24 hours 03/04/16 0041 03/04/16 1014   03/03/16 2300  cefTRIAXone (ROCEPHIN) 1 g in dextrose 5 % 50 mL IVPB     1 g 100 mL/hr over 30 Minutes Intravenous  Once 03/03/16 2252 03/04/16 0107        Objective:   Vitals:   03/04/16 0650 03/04/16 0710 03/04/16 1153 03/04/16 1421  BP: (!) 94/47 (!) 92/48 (!) 100/58 (!) 96/54  Pulse: (!) 45 (!) 48 (!) 52 (!) 51  Resp: 15 15 17 12   Temp:  97.3 F (36.3 C) 97.3 F (36.3 C) 97.3 F (36.3 C)  TempSrc:  Axillary Oral Oral  SpO2: 100% 100% 99% 99%  Weight:      Height:        Wt Readings from Last 3 Encounters:  03/04/16 77.5 kg (170 lb 13.7 oz)  02/11/16 78.2 kg (172 lb 4.8 oz)  01/12/16 77.2 kg (170 lb 3.2 oz)  Intake/Output Summary (Last 24 hours) at 03/04/16 1509 Last data filed at 03/04/16 1400  Gross per 24 hour  Intake             4150 ml  Output                0 ml  Net             4150 ml     Physical Exam  Awake Alert, Oriented X 3,  [place].AT,PERRAL Supple Neck,No JVD Symmetrical Chest wall movement, Good air movement bilaterally, CTAB RRR,No Gallops,Rubs or new Murmurs, No Parasternal Heave +ve B.Sounds, Abd Soft, No tenderness,  No  rebound - guarding or rigidity. No Cyanosis, Clubbing or edema, No new Rash or bruise      Data Review:    CBC  Recent Labs Lab 03/03/16 2142 03/04/16 0211 03/04/16 1357  WBC 11.2* 14.9*  --   HGB 10.9* 9.2* 9.5*  HCT 34.6* 29.6* 29.7*  PLT 168 156  --   MCV 85.6 84.6  --   MCH 27.0 26.3  --   MCHC 31.5 31.1  --   RDW 20.1* 19.9*  --   LYMPHSABS 0.4*  --   --   MONOABS 0.2  --   --   EOSABS 0.0  --   --   BASOSABS 0.0  --   --     Chemistries   Recent Labs Lab 03/03/16 2142  NA 137  K 3.9  CL 106  CO2 24  GLUCOSE 143*  BUN 19  CREATININE 1.51*  CALCIUM 8.1*  AST 28  ALT 21  ALKPHOS 64  BILITOT 0.7   ------------------------------------------------------------------------------------------------------------------ No results for input(s): CHOL, HDL, LDLCALC, TRIG, CHOLHDL, LDLDIRECT in the last 72 hours.  Lab Results  Component Value Date   HGBA1C 5.2 02/08/2016   ------------------------------------------------------------------------------------------------------------------  Recent Labs  03/03/16 2204  TSH 1.301   ------------------------------------------------------------------------------------------------------------------ No results for input(s): VITAMINB12, FOLATE, FERRITIN, TIBC, IRON, RETICCTPCT in the last 72 hours.  Coagulation profile  Recent Labs Lab 02/28/16 03/03/16 2142 03/04/16 0211  INR 4.3 2.37 2.34    No results for input(s): DDIMER in the last 72 hours.  Cardiac Enzymes  Recent Labs Lab 03/04/16 0520 03/04/16 0758 03/04/16 1357  TROPONINI 0.33* 0.29* 0.17*   ------------------------------------------------------------------------------------------------------------------    Component Value Date/Time   BNP 420.9 (H) 03/03/2016 2142    Inpatient Medications  Scheduled Meds: . atorvastatin  20 mg Oral Daily  . [START ON 03/05/2016] azithromycin  500 mg Intravenous Q24H  . [START ON 03/05/2016] ceFEPime  (MAXIPIME) IV  2 g Intravenous Q24H  . ferrous sulfate  325 mg Oral Q breakfast  . furosemide  20 mg Intravenous BID  . mouth rinse  15 mL Mouth Rinse BID  . potassium chloride SA  20 mEq Oral Daily  . [START ON 03/05/2016] vancomycin  750 mg Intravenous Q24H  . warfarin  2 mg Oral Once per day on Mon Fri  . [START ON 03/05/2016] warfarin  4 mg Oral Once per day on Sun Tue Wed Thu Sat  . Warfarin - Pharmacist Dosing Inpatient   Does not apply q1800   Continuous Infusions: . sodium chloride 100 mL/hr at 03/04/16 1035   PRN Meds:.acetaminophen **OR** acetaminophen, albuterol, ondansetron **OR** ondansetron (ZOFRAN) IV  Micro Results Recent Results (from the past 240 hour(s))  MRSA PCR Screening     Status: None   Collection Time: 03/04/16  4:01 AM  Result Value Ref Range Status  MRSA by PCR NEGATIVE NEGATIVE Final    Comment:        The GeneXpert MRSA Assay (FDA approved for NASAL specimens only), is one component of a comprehensive MRSA colonization surveillance program. It is not intended to diagnose MRSA infection nor to guide or monitor treatment for MRSA infections.     Radiology Reports Dg Chest 1 View  Result Date: 03/04/2016 CLINICAL DATA:  SOB since this morning. EXAM: CHEST 1 VIEW COMPARISON:  1 day prior FINDINGS: Patient rotated to the left. Cardiomegaly accentuated by AP portable technique. Small left pleural effusion. No pneumothorax. Mild interstitial edema is new or increased. Improved bibasilar atelectasis. Inferior right upper lobe opacity is not significantly changed and measures on the order of 3.8 cm. IMPRESSION: Development of mild interstitial edema. Persistent left pleural effusion with decreased bibasilar atelectasis. Right upper lobe rounded density is similar to on the most recent exam. Not readily apparent on 02/07/2016, arguing against a neoplastic process. Possibly an area of loculated pleural fluid. Consider further evaluation with repeat PA and  lateral radiographs. Electronically Signed   By: Abigail Miyamoto M.D.   On: 03/04/2016 08:46   Dg Chest 2 View  Result Date: 02/07/2016 CLINICAL DATA:  Recent fall with altered mental status EXAM: CHEST  2 VIEW COMPARISON:  12/06/2015 FINDINGS: Cardiac shadow is stable. Thickening of the minor and major fissures are again identified on the right and stable. No focal infiltrate or sizable effusion is noted. No acute bony abnormality is noted. IMPRESSION: Thickening of the major and minor fissures on the right. No acute abnormality is noted. Electronically Signed   By: Inez Catalina M.D.   On: 02/07/2016 21:51   Ct Head Wo Contrast  Result Date: 03/03/2016 CLINICAL DATA:  Altered mental status. EXAM: CT HEAD WITHOUT CONTRAST TECHNIQUE: Contiguous axial images were obtained from the base of the skull through the vertex without intravenous contrast. COMPARISON:  CT 02/07/2016, MRI 02/08/2016 FINDINGS: Brain: No evidence of acute infarction, hemorrhage, hydrocephalus, extra-axial collection or mass lesion/mass effect. Stable degree of atrophy and chronic small vessel ischemia. Vascular: Atherosclerosis of skullbase vasculature without hyperdense vessel or abnormal calcification. Skull: Normal. Negative for fracture or focal lesion. Sinuses/Orbits: Post bilateral cataract resection. Mild chronic mucosal thickening in the paranasal sinuses. No fluid levels. Mastoid air cells are well aerated. Other: None. IMPRESSION: No acute intracranial abnormality. Electronically Signed   By: Jeb Levering M.D.   On: 03/03/2016 23:10   Ct Head Wo Contrast  Result Date: 02/07/2016 CLINICAL DATA:  Altered mental status EXAM: CT HEAD WITHOUT CONTRAST TECHNIQUE: Contiguous axial images were obtained from the base of the skull through the vertex without intravenous contrast. COMPARISON:  02/03/2016 FINDINGS: Brain: No evidence of acute infarction, hemorrhage, hydrocephalus, extra-axial collection or mass lesion/mass effect.  Mild chronic white matter ischemic change and mild atrophy are seen. Vascular: No hyperdense vessel or unexpected calcification. Skull: Normal. Negative for fracture or focal lesion. Sinuses/Orbits: No acute finding. Other: None. IMPRESSION: No acute intracranial abnormality noted. Electronically Signed   By: Inez Catalina M.D.   On: 02/07/2016 19:40   Mr Brain Wo Contrast  Result Date: 02/08/2016 CLINICAL DATA:  Fall.  Altered mental status. EXAM: MRI HEAD WITHOUT CONTRAST TECHNIQUE: Multiplanar, multiecho pulse sequences of the brain and surrounding structures were obtained without intravenous contrast. COMPARISON:  Head CT 02/07/2016 FINDINGS: Brain: No acute infarct or intraparenchymal hemorrhage. There is beginning confluent hyperintense T2-weighted signal within the periventricular white matter, mildly asymmetric T2 hyperintensity and volume loss  of the left hippocampus. No mass lesion or midline shift. No hydrocephalus or extra-axial fluid collection. No age advanced atrophy. The midline structures are normal. Vascular: Major intracranial arterial and venous sinus flow voids are preserved. No evidence of chronic microhemorrhage or amyloid angiopathy. Skull and upper cervical spine: The visualized skull base, calvarium, upper cervical spine and extracranial soft tissues are normal. Sinuses/Orbits: No fluid levels or advanced mucosal thickening. No mastoid effusion. Normal orbits. IMPRESSION: 1. No acute intracranial abnormality. 2. Mild atrophy and findings of chronic microvascular ischemia. Electronically Signed   By: Ulyses Jarred M.D.   On: 02/08/2016 05:21   Dg Chest Port 1 View  Result Date: 03/03/2016 CLINICAL DATA:  Altered mental status. EXAM: PORTABLE CHEST 1 VIEW COMPARISON:  Chest radiograph 02/07/2016 FINDINGS: Cardiomediastinal silhouette is enlarged. Mediastinal contours appear intact. Calcific atherosclerotic disease of the aorta noted. There is no evidence of pneumothorax. Low lung  volumes with bibasilar atelectasis. Probable bilateral pleural effusions, greater on the left. Rounded density projecting over the lateral mid right thorax may represent loculated pleural effusion versus pulmonary or pleural based mass. Osseous structures are without acute abnormality. Soft tissues are grossly normal. Breast implants are noted. IMPRESSION: Probable bilateral pleural effusions, greater on the left with bibasilar atelectasis. Rounded density projecting over the lateral mid right hemithorax may represent loculated pleural effusion versus pulmonary or pleural based mass. Cross-sectional imaging is recommended if further evaluation is clinically desired. Electronically Signed   By: Fidela Salisbury M.D.   On: 03/03/2016 22:23   Dg Knee Complete 4 Views Right  Result Date: 02/07/2016 CLINICAL DATA:  Status post fall, with right knee pain. Initial encounter. EXAM: RIGHT KNEE - COMPLETE 4+ VIEW COMPARISON:  None. FINDINGS: There is no evidence of fracture or dislocation. A bone island is noted at the proximal tibia. The joint spaces are preserved. No significant degenerative change is seen; the patellofemoral joint is grossly unremarkable in appearance. No significant joint effusion is seen. The visualized soft tissues are normal in appearance. IMPRESSION: No evidence of fracture or dislocation. Electronically Signed   By: Garald Balding M.D.   On: 02/07/2016 23:49   Dg Hips Bilat With Pelvis 3-4 Views  Result Date: 02/07/2016 CLINICAL DATA:  Bilateral hip pain following fall, initial encounter EXAM: DG HIP (WITH OR WITHOUT PELVIS) 3-4V BILAT COMPARISON:  None. FINDINGS: Pelvic ring is intact. No acute fracture or dislocation is noted. No soft tissue abnormality is noted. IMPRESSION: No acute abnormality noted. Electronically Signed   By: Inez Catalina M.D.   On: 02/07/2016 21:49     Shalena Ezzell M.D on 03/04/2016 at 3:09 PM  Between 7am to 7pm - Pager - [phone]  After 7pm go  to [email or web address] - password [facility]  [facility] -  Office  [phone]

## 2016-03-04 NOTE — Progress Notes (Signed)
0615am Pt HR dropped from mid 50's to low- mid 40's. Pt is lethargic, minimally arousal. Pt response to touch makes eye contact but goes back to sleep. While in ED HR were 50-80's. MD notified. Rapid Response Jerene Pitch also notified of pt condition. While in pt room BP dropped to 80's/ 40's. MD paged again. MD ordered 250 ml bolus, labs for dig levels to test for toxicity and came to see pt along with rapid response nurse. 0630am  Pt HR dropped to high 30's. MD notified . MD came back up to see pt. While in room MD ordered a 500 ml bolus at 250 ml/hr and CXR. MD stated she would put hold on all BP medications.  Pt is a DNR. I paged day shift MD to call family with an update on patients medical changes and status. No response. Report was giving to day shift nurse with all updates. Will continue to monitor patient.

## 2016-03-04 NOTE — Progress Notes (Signed)
PHARMACY CONSULT NOTE - Initial Consult  Pharmacy Consult for Warfarin  Indication: atrial fibrillation   Pharmacy Consult for Cefepime Indication: UTI  Allergies  Allergen Reactions  . Codeine Anaphylaxis and Nausea And Vomiting    Note: pt has tramadol for years with no reaction  . Morphine And Related Nausea And Vomiting    Severe nausea per daughter  . Valium [Diazepam] Shortness Of Breath  . Sulfa Antibiotics Itching and Rash  . Darvon [Propoxyphene] Nausea And Vomiting  . Floxin [Ofloxacin] Itching  . Levsin [Hyoscyamine Sulfate] Other (See Comments)    Unknown reaction  . Penicillins Itching    Itching reported by [facility] Has patient had a PCN reaction causing immediate rash, facial/tongue/throat swelling, SOB or lightheadedness with hypotension: Yes Has patient had a PCN reaction causing severe rash involving mucus membranes or skin necrosis: No Has patient had a PCN reaction that required hospitalization No Has patient had a PCN reaction occurring within the last 10 years: No If all of the above answers are "NO", then may proceed with Cephalosporin use.   . Zantac [Ranitidine Hcl] Other (See Comments)    Unknown reaction    Patient Measurements: Height: 5\' 5"  (165.1 cm) Weight: 172 lb (78 kg) IBW/kg (Calculated) : 57  Vital Signs: Temp: 98.8 F (37.1 C) (12/24 2308) Temp Source: Oral (12/24 2308) BP: 106/50 (12/25 0000) Pulse Rate: 67 (12/25 0000)  Labs:  Recent Labs  03/03/16 2142 03/03/16 2204  HGB 10.9*  --   HCT 34.6*  --   PLT 168  --   APTT 46*  --   LABPROT 26.3*  --   INR 2.37  --   CREATININE 1.51*  --   CKTOTAL  --  31*    Estimated Creatinine Clearance: 29.7 mL/min (by C-G formula based on SCr of 1.51 mg/dL (H)).   Medical History: Past Medical History:  Diagnosis Date  . Arthritis    "joints" (08/04/2014)  . Bipolar 1 disorder ([place])   . Cancer of right breast ([place])   . Chronic lower back pain   .  Hyperlipemia   . Hypertension   . PAF (paroxysmal atrial fibrillation) (HCC)    Assessment: 82 y/o F presents to the ED with altered mental status, pt with pyuria per EDP, WBC mildly elevated, mild bump in Scr, pt takes warfarin PTA for afib>>to continue. INR is therapeutic at 2.37 on admit. Hgb 10.9. Warfarin PTA dosing is 2 mg on Mon/Fri and 4 mg all other days.   Goal of Therapy:  INR 2-3 Monitor platelets by anticoagulation protocol: Yes   Plan:  -Warfarin 2 mg Mon/Fri, 4 mg all other days -Daily PT/INR -Monitor for bleeding  -Cefepime 2g IV q24h -Trend WBC, temp, renal function  -F/U urine culture for directed therapy  Narda Bonds 03/04/2016,12:52 AM

## 2016-03-04 NOTE — Progress Notes (Signed)
CRITICAL VALUE ALERT  Critical value received: Troponin 0.33  Date of notification:  03/04/16  Time of notification:  0550  Critical value read back: yes  Nurse who received alert:  Carolyne Fiscal RN  MD notified (1st page):  Janne Napoleon ([place])   Time of first page:  [phone]  Responding MD:  Janne Napoleon  MD came up to floor. [id number]

## 2016-03-04 NOTE — ED Notes (Signed)
Report attempted, RN to call back. 

## 2016-03-04 NOTE — ED Notes (Signed)
Report attempted 

## 2016-03-05 ENCOUNTER — Other Ambulatory Visit (HOSPITAL_COMMUNITY): Payer: Medicare Other

## 2016-03-05 ENCOUNTER — Inpatient Hospital Stay (HOSPITAL_COMMUNITY): Payer: Medicare Other

## 2016-03-05 DIAGNOSIS — J189 Pneumonia, unspecified organism: Secondary | ICD-10-CM

## 2016-03-05 DIAGNOSIS — J90 Pleural effusion, not elsewhere classified: Secondary | ICD-10-CM

## 2016-03-05 DIAGNOSIS — N3000 Acute cystitis without hematuria: Secondary | ICD-10-CM

## 2016-03-05 DIAGNOSIS — R918 Other nonspecific abnormal finding of lung field: Secondary | ICD-10-CM

## 2016-03-05 DIAGNOSIS — J810 Acute pulmonary edema: Secondary | ICD-10-CM

## 2016-03-05 DIAGNOSIS — R0902 Hypoxemia: Secondary | ICD-10-CM

## 2016-03-05 DIAGNOSIS — A4151 Sepsis due to Escherichia coli [E. coli]: Secondary | ICD-10-CM

## 2016-03-05 LAB — PROTIME-INR
INR: 2.39
Prothrombin Time: 26.5 seconds — ABNORMAL HIGH (ref 11.4–15.2)

## 2016-03-05 LAB — BASIC METABOLIC PANEL
Anion gap: 9 (ref 5–15)
BUN: 25 mg/dL — ABNORMAL HIGH (ref 6–20)
CO2: 20 mmol/L — ABNORMAL LOW (ref 22–32)
Calcium: 8.2 mg/dL — ABNORMAL LOW (ref 8.9–10.3)
Chloride: 111 mmol/L (ref 101–111)
Creatinine, Ser: 1.91 mg/dL — ABNORMAL HIGH (ref 0.44–1.00)
GFR calc Af Amer: 27 mL/min — ABNORMAL LOW (ref >60)
GFR calc non Af Amer: 23 mL/min — ABNORMAL LOW (ref >60)
Glucose, Bld: 102 mg/dL — ABNORMAL HIGH (ref 65–99)
Potassium: 4 mmol/L (ref 3.5–5.1)
Sodium: 140 mmol/L (ref 135–145)

## 2016-03-05 LAB — CBC
HCT: 32.5 % — ABNORMAL LOW (ref 36.0–46.0)
Hemoglobin: 10 g/dL — ABNORMAL LOW (ref 12.0–15.0)
MCH: 26.7 pg (ref 26.0–34.0)
MCHC: 30.8 g/dL (ref 30.0–36.0)
MCV: 86.9 fL (ref 78.0–100.0)
Platelets: 168 10*3/uL (ref 150–400)
RBC: 3.74 MIL/uL — ABNORMAL LOW (ref 3.87–5.11)
RDW: 20.8 % — ABNORMAL HIGH (ref 11.5–15.5)
WBC: 10.9 10*3/uL — ABNORMAL HIGH (ref 4.0–10.5)

## 2016-03-05 MED ORDER — DEXTROSE 5 % IV SOLN
1.0000 g | INTRAVENOUS | Status: DC
  Administered 2016-03-06: 1 g via INTRAVENOUS
  Filled 2016-03-05: qty 1

## 2016-03-05 MED ORDER — CARVEDILOL 6.25 MG PO TABS
6.2500 mg | ORAL_TABLET | Freq: Two times a day (BID) | ORAL | Status: DC
Start: 2016-03-05 — End: 2016-03-09
  Administered 2016-03-05 – 2016-03-09 (×9): 6.25 mg via ORAL
  Filled 2016-03-05 (×9): qty 1

## 2016-03-05 MED ORDER — HYDRALAZINE HCL 20 MG/ML IJ SOLN
10.0000 mg | Freq: Once | INTRAMUSCULAR | Status: AC
  Administered 2016-03-05: 10 mg via INTRAVENOUS
  Filled 2016-03-05: qty 1

## 2016-03-05 MED ORDER — GUAIFENESIN ER 600 MG PO TB12
1200.0000 mg | ORAL_TABLET | Freq: Two times a day (BID) | ORAL | Status: DC
  Administered 2016-03-05 – 2016-03-09 (×8): 1200 mg via ORAL
  Filled 2016-03-05 (×8): qty 2

## 2016-03-05 MED ORDER — DILTIAZEM HCL ER COATED BEADS 120 MG PO CP24
120.0000 mg | ORAL_CAPSULE | Freq: Every day | ORAL | Status: DC
  Administered 2016-03-05 – 2016-03-09 (×5): 120 mg via ORAL
  Filled 2016-03-05 (×5): qty 1

## 2016-03-05 MED ORDER — TRAZODONE HCL 100 MG PO TABS
150.0000 mg | ORAL_TABLET | Freq: Every day | ORAL | Status: DC
  Administered 2016-03-05 – 2016-03-08 (×4): 150 mg via ORAL
  Filled 2016-03-05 (×4): qty 1

## 2016-03-05 MED ORDER — WHITE PETROLATUM GEL
Status: AC
  Administered 2016-03-05: 15:00:00
  Filled 2016-03-05: qty 1

## 2016-03-05 MED ORDER — HYDRALAZINE HCL 20 MG/ML IJ SOLN
10.0000 mg | INTRAMUSCULAR | Status: DC | PRN
  Administered 2016-03-05 – 2016-03-06 (×4): 10 mg via INTRAVENOUS
  Filled 2016-03-05 (×5): qty 1

## 2016-03-05 MED ORDER — WHITE PETROLATUM GEL
Status: AC
  Administered 2016-03-05: 05:00:00
  Filled 2016-03-05: qty 1

## 2016-03-05 NOTE — Progress Notes (Addendum)
[place] for Warfarin, cefepime, vancomycin  Indication: atrial fibrillation; sepsis   Allergies  Allergen Reactions  . Codeine Anaphylaxis and Nausea And Vomiting    Note: pt has tramadol for years with no reaction  . Morphine And Related Nausea And Vomiting    Severe nausea per daughter  . Valium [Diazepam] Shortness Of Breath  . Sulfa Antibiotics Itching and Rash  . Darvon [Propoxyphene] Nausea And Vomiting  . Floxin [Ofloxacin] Itching  . Levsin [Hyoscyamine Sulfate] Other (See Comments)    Unknown reaction  . Penicillins Itching    Itching reported by [facility] Has patient had a PCN reaction causing immediate rash, facial/tongue/throat swelling, SOB or lightheadedness with hypotension: Yes Has patient had a PCN reaction causing severe rash involving mucus membranes or skin necrosis: No Has patient had a PCN reaction that required hospitalization No Has patient had a PCN reaction occurring within the last 10 years: No If all of the above answers are "NO", then may proceed with Cephalosporin use.   . Zantac [Ranitidine Hcl] Other (See Comments)    Unknown reaction    Patient Measurements: Height: 5\' 9"  (175.3 cm) (per grandson) Weight: 180 lb 8.9 oz (81.9 kg) IBW/kg (Calculated) : 66.2  Vital Signs: Temp: 97.7 F (36.5 C) (12/26 0834) Temp Source: Oral (12/26 0834) BP: 165/79 (12/26 0900) Pulse Rate: 80 (12/26 0900)  Labs:  Recent Labs  03/03/16 2142 03/03/16 2204 03/04/16 0211  03/04/16 0758 03/04/16 1357 03/04/16 1822 03/05/16 0401  HGB 10.9*  --  9.2*  --   --  9.5*  --  10.0*  HCT 34.6*  --  29.6*  --   --  29.7*  --  32.5*  PLT 168  --  156  --   --   --   --  168  APTT 46*  --   --   --   --   --   --   --   LABPROT 26.3*  --  26.1*  --   --   --   --  26.5*  INR 2.37  --  2.34  --   --   --   --  2.39  CREATININE 1.51*  --   --   --   --   --   --  1.91*  CKTOTAL  --  31*  --   --   --   --    --   --   TROPONINI  --   --   --   < > 0.29* 0.17* 0.12*  --   < > = values in this interval not displayed.  Estimated Creatinine Clearance: 26 mL/min (by C-G formula based on SCr of 1.91 mg/dL (H)).   Assessment: 82 y/o F presents to the ED with altered mental status and suspected UTI. Patient takes warfarin PTA for afib and is to continue inpatient. Pharmacy to dose. INR is therapeutic at 2.37 on admission. -INR= 2.39 and at goal (azithromycin may increase INR d/t drug interaction with warfarin).  Warfarin PTA dosing is 2 mg on Mon/Fri and 4 mg all other days. No overt s/s bleeding noted.   She is als on vancomycin and cefepime for sepsis due to UTI/PNA. WBC= 10, afebrile, SCr= 1.91 (up) and CrCl ~ 25  Goal of Therapy:  INR 2-3 Monitor platelets by anticoagulation protocol: Yes   Plan Warfarin 2 mg Mon/Fri, 4 mg all other days Daily PT/INR Change cefepime  to 1gm IV q24hr No vancomycin changes needed Follow renal function, cultures and clinical progress  Hildred Laser, Pharm D 03/05/2016 10:51 AM

## 2016-03-05 NOTE — Progress Notes (Signed)
PROGRESS NOTE                                                                                                                                                                                                             Patient Demographics:    Tracy Rodriguez, is a 82 y.o. female, DOB - 1931/03/30, [id number]  Admit date - 03/03/2016   Admitting Physician Norval Morton, MD  Outpatient Primary MD for the patient is [name], MD  LOS - 1  Chief Complaint  Patient presents with  . Altered Mental Status       Brief Narrative   82 y.o. female with medical history significant of HTN, HLD, PAF, breast cancer s/p right mastectomy, CKD stage III, and bipolar disorder; who presents with acute altered mental status, Workup significant for sepsis secondary to UTI and pneumonia.   Subjective:    [place] today has, No headache, No chest pain, No abdominal pain - No Nausea, Report generalized weakness, reported cough, denies any urinary symptoms.  Assessment  & Plan :    Principal Problem:   Sepsis ([place]) Active Problems:   Bipolar disorder ([place])   Paroxysmal atrial fibrillation ([place])   Acute kidney injury ([place])   Abnormal chest x-ray   Acute renal failure superimposed on stage 3 chronic kidney disease (H[facility])   CHF (congestive heart failure) (H[facility])   Anemia   Sepsis  - Continue suspected UTI and CAP , he presents with a rectal temperature of 105.6 , respirations up to 24, WBC 11.2, and lactic acid 1.32. With altered mental status . - Workup significant for positive urinalysis, she reports cough, and possible new left upper lung pneumonia . - Sepsis protocol initiated , hypotensive requiring multiple fluid boluses, appears to be improving, hold antihypertensive medication and Lasix - f/u blood, sputum,  & urine culture  - Continue with IV vancomycin, cefepime and azithromycin  UTI - Continue with IV cefepime, urine cultures  Growing Escherichia coli, pending sensitivities   CAP - Chest x-ray with right upper lung opacity, CT chest significant for masslike right lung nodularity, neoplasm versus infection, and interlobular septal thickening , pulmonary consulted for further evaluation regarding these findings . - Continue with broad-spectrum IV antibiotic coverage  - Patient with significant cough and secretion, encourage pulmonary toilet, incentive spirometry, flutter valve, and Mucinex  Elevated troponins - She denies any chest pain, non-ACS pattern, most likely related to sepsis and hypotension  Acute kidney injury on chronic kidney disease stage III:  - Creatinine baseline around 1.09 , elevated creatinine on admission , today is 1.9, this is most likely due to ATN from hypotension and sepsis, continue with hydration   chronic diastolic CHF  - Patient notes lower extremity swelling and new cough. CXR abnormal with b/l pleural effusions. Last echocardiogram showed EF of 65-75% on 02/09/2016 - continuous pulse oximetry with nasal cannula oxygen to keep O2 sats patient greater 92%. - appears appears euvolemic  Paroxysmal atrial fibrillation on anticoagulation: - INR therapeutic at 2.37. Currently rate controlled at this time.  - Continue  digoxin, Cardizem and metoprolol and warfarin per pharmacy once able  Bipolar disorder - Held Seroquel  Essential hypertension - Initially hypotensive requiring fluid boluses, today blood pressure is elevated, so we'll resume back on home medication Hyperlipidemia - Continue atorvastatin  Anemia:  - continue to monitor    Code Status : DO NOT RESUSCITATE  Family Communication  : None at bedside  Disposition Plan  : We'll consult PT when stable  Consults  :  None  Procedures  : none  DVT Prophylaxis  : On warfarin  Lab Results  Component Value Date   PLT 168 03/05/2016    Antibiotics  :    Anti-infectives    Start     Dose/Rate Route Frequency  Ordered Stop   03/06/16 0500  ceFEPIme (MAXIPIME) 1 g in dextrose 5 % 50 mL IVPB     1 g 100 mL/hr over 30 Minutes Intravenous Every 24 hours 03/05/16 1139     03/05/16 0500  azithromycin (ZITHROMAX) 500 mg in dextrose 5 % 250 mL IVPB     500 mg 250 mL/hr over 60 Minutes Intravenous Every 24 hours 03/04/16 1002     03/05/16 0500  vancomycin (VANCOCIN) IVPB 750 mg/150 ml premix     750 mg 150 mL/hr over 60 Minutes Intravenous Every 24 hours 03/04/16 1013     03/05/16 0500  ceFEPIme (MAXIPIME) 2 g in dextrose 5 % 50 mL IVPB  Status:  Discontinued     2 g 100 mL/hr over 30 Minutes Intravenous Every 24 hours 03/04/16 1014 03/05/16 1139   03/04/16 0300  vancomycin (VANCOCIN) IVPB 1000 mg/200 mL premix     1,000 mg 200 mL/hr over 60 Minutes Intravenous STAT 03/04/16 0203 03/04/16 0527   03/04/16 0230  azithromycin (ZITHROMAX) 500 mg in dextrose 5 % 250 mL IVPB     500 mg 250 mL/hr over 60 Minutes Intravenous STAT 03/04/16 0204 03/04/16 0528   03/04/16 0115  ceFEPIme (MAXIPIME) 2 g in dextrose 5 % 50 mL IVPB  Status:  Discontinued     2 g 100 mL/hr over 30 Minutes Intravenous Every 24 hours 03/04/16 0041 03/04/16 1014   03/03/16 2300  cefTRIAXone (ROCEPHIN) 1 g in dextrose 5 % 50 mL IVPB     1 g 100 mL/hr over 30 Minutes Intravenous  Once 03/03/16 2252 03/04/16 0107        Objective:   Vitals:   03/05/16 0833 03/05/16 0834 03/05/16 0900 03/05/16 1207  BP: (!) 198/85  (!) 165/79 (!) 168/83  Pulse: 87  80 83  Resp:    19  Temp:  97.7 F (36.5 C)  98.2 F (36.8 C)  TempSrc:  Oral  Oral  SpO2: 94%  95% 97%  Weight:  Height:        Wt Readings from Last 3 Encounters:  03/05/16 81.9 kg (180 lb 8.9 oz)  02/11/16 78.2 kg (172 lb 4.8 oz)  01/12/16 77.2 kg (170 lb 3.2 oz)     Intake/Output Summary (Last 24 hours) at 03/05/16 1439 Last data filed at 03/05/16 1311  Gross per 24 hour  Intake          3563.34 ml  Output             3200 ml  Net           363.34 ml      Physical Exam  Awake Alert, Oriented X 3,  [place].AT,PERRAL Supple Neck,No JVD Symmetrical Chest wall movement, Good air movement bilaterally, CTAB RRR,No Gallops,Rubs or new Murmurs, No Parasternal Heave +ve B.Sounds, Abd Soft, No tenderness,  No rebound - guarding or rigidity. No Cyanosis, Clubbing or edema, No new Rash or bruise      Data Review:    CBC  Recent Labs Lab 03/03/16 2142 03/04/16 0211 03/04/16 1357 03/05/16 0401  WBC 11.2* 14.9*  --  10.9*  HGB 10.9* 9.2* 9.5* 10.0*  HCT 34.6* 29.6* 29.7* 32.5*  PLT 168 156  --  168  MCV 85.6 84.6  --  86.9  MCH 27.0 26.3  --  26.7  MCHC 31.5 31.1  --  30.8  RDW 20.1* 19.9*  --  20.8*  LYMPHSABS 0.4*  --   --   --   MONOABS 0.2  --   --   --   EOSABS 0.0  --   --   --   BASOSABS 0.0  --   --   --     Chemistries   Recent Labs Lab 03/03/16 2142 03/05/16 0401  NA 137 140  K 3.9 4.0  CL 106 111  CO2 24 20*  GLUCOSE 143* 102*  BUN 19 25*  CREATININE 1.51* 1.91*  CALCIUM 8.1* 8.2*  AST 28  --   ALT 21  --   ALKPHOS 64  --   BILITOT 0.7  --    ------------------------------------------------------------------------------------------------------------------ No results for input(s): CHOL, HDL, LDLCALC, TRIG, CHOLHDL, LDLDIRECT in the last 72 hours.  Lab Results  Component Value Date   HGBA1C 5.2 02/08/2016   ------------------------------------------------------------------------------------------------------------------  Recent Labs  03/03/16 2204  TSH 1.301   ------------------------------------------------------------------------------------------------------------------ No results for input(s): VITAMINB12, FOLATE, FERRITIN, TIBC, IRON, RETICCTPCT in the last 72 hours.  Coagulation profile  Recent Labs Lab 02/28/16 03/03/16 2142 03/04/16 0211 03/05/16 0401  INR 4.3 2.37 2.34 2.39    No results for input(s): DDIMER in the last 72 hours.  Cardiac Enzymes  Recent Labs Lab  03/04/16 0758 03/04/16 1357 03/04/16 1822  TROPONINI 0.29* 0.17* 0.12*   ------------------------------------------------------------------------------------------------------------------    Component Value Date/Time   BNP 420.9 (H) 03/03/2016 2142    Inpatient Medications  Scheduled Meds: . atorvastatin  20 mg Oral Daily  . azithromycin  500 mg Intravenous Q24H  . carvedilol  6.25 mg Oral BID WC  . [START ON 03/06/2016] ceFEPime (MAXIPIME) IV  1 g Intravenous Q24H  . diltiazem  120 mg Oral Daily  . ferrous sulfate  325 mg Oral Q breakfast  . mouth rinse  15 mL Mouth Rinse BID  . potassium chloride SA  20 mEq Oral Daily  . vancomycin  750 mg Intravenous Q24H  . warfarin  2 mg Oral Once per day on Mon Fri  . warfarin  4 mg  Oral Once per day on Sun Tue Wed Thu Sat  . Warfarin - Pharmacist Dosing Inpatient   Does not apply q1800   Continuous Infusions: . sodium chloride 100 mL/hr at 03/05/16 0410   PRN Meds:.acetaminophen **OR** acetaminophen, albuterol, hydrALAZINE, ondansetron **OR** ondansetron (ZOFRAN) IV  Micro Results Recent Results (from the past 240 hour(s))  Blood Culture (routine x 2)     Status: None (Preliminary result)   Collection Time: 03/03/16  9:35 PM  Result Value Ref Range Status   Specimen Description BLOOD LEFT ANTECUBITAL  Final   Special Requests IN PEDIATRIC BOTTLE 4CC  Final   Culture NO GROWTH 2 DAYS  Final   Report Status PENDING  Incomplete  Blood Culture (routine x 2)     Status: None (Preliminary result)   Collection Time: 03/03/16  9:43 PM  Result Value Ref Range Status   Specimen Description BLOOD RIGHT ARM  Final   Special Requests BOTTLES DRAWN AEROBIC AND ANAEROBIC 10CC EA  Final   Culture NO GROWTH 2 DAYS  Final   Report Status PENDING  Incomplete  Urine culture     Status: Abnormal (Preliminary result)   Collection Time: 03/03/16 10:00 PM  Result Value Ref Range Status   Specimen Description URINE, CATHETERIZED  Final   Special  Requests NONE  Final   Culture >=100,000 COLONIES/mL ESCHERICHIA COLI (A)  Final   Report Status PENDING  Incomplete  MRSA PCR Screening     Status: None   Collection Time: 03/04/16  4:01 AM  Result Value Ref Range Status   MRSA by PCR NEGATIVE NEGATIVE Final    Comment:        The GeneXpert MRSA Assay (FDA approved for NASAL specimens only), is one component of a comprehensive MRSA colonization surveillance program. It is not intended to diagnose MRSA infection nor to guide or monitor treatment for MRSA infections.     Radiology Reports Dg Chest 1 View  Result Date: 03/04/2016 CLINICAL DATA:  SOB since this morning. EXAM: CHEST 1 VIEW COMPARISON:  1 day prior FINDINGS: Patient rotated to the left. Cardiomegaly accentuated by AP portable technique. Small left pleural effusion. No pneumothorax. Mild interstitial edema is new or increased. Improved bibasilar atelectasis. Inferior right upper lobe opacity is not significantly changed and measures on the order of 3.8 cm. IMPRESSION: Development of mild interstitial edema. Persistent left pleural effusion with decreased bibasilar atelectasis. Right upper lobe rounded density is similar to on the most recent exam. Not readily apparent on 02/07/2016, arguing against a neoplastic process. Possibly an area of loculated pleural fluid. Consider further evaluation with repeat PA and lateral radiographs. Electronically Signed   By: Abigail Miyamoto M.D.   On: 03/04/2016 08:46   Dg Chest 2 View  Result Date: 02/07/2016 CLINICAL DATA:  Recent fall with altered mental status EXAM: CHEST  2 VIEW COMPARISON:  12/06/2015 FINDINGS: Cardiac shadow is stable. Thickening of the minor and major fissures are again identified on the right and stable. No focal infiltrate or sizable effusion is noted. No acute bony abnormality is noted. IMPRESSION: Thickening of the major and minor fissures on the right. No acute abnormality is noted. Electronically Signed   By: Inez Catalina M.D.   On: 02/07/2016 21:51   Ct Head Wo Contrast  Result Date: 03/03/2016 CLINICAL DATA:  Altered mental status. EXAM: CT HEAD WITHOUT CONTRAST TECHNIQUE: Contiguous axial images were obtained from the base of the skull through the vertex without intravenous contrast. COMPARISON:  CT  02/07/2016, MRI 02/08/2016 FINDINGS: Brain: No evidence of acute infarction, hemorrhage, hydrocephalus, extra-axial collection or mass lesion/mass effect. Stable degree of atrophy and chronic small vessel ischemia. Vascular: Atherosclerosis of skullbase vasculature without hyperdense vessel or abnormal calcification. Skull: Normal. Negative for fracture or focal lesion. Sinuses/Orbits: Post bilateral cataract resection. Mild chronic mucosal thickening in the paranasal sinuses. No fluid levels. Mastoid air cells are well aerated. Other: None. IMPRESSION: No acute intracranial abnormality. Electronically Signed   By: Jeb Levering M.D.   On: 03/03/2016 23:10   Ct Head Wo Contrast  Result Date: 02/07/2016 CLINICAL DATA:  Altered mental status EXAM: CT HEAD WITHOUT CONTRAST TECHNIQUE: Contiguous axial images were obtained from the base of the skull through the vertex without intravenous contrast. COMPARISON:  02/03/2016 FINDINGS: Brain: No evidence of acute infarction, hemorrhage, hydrocephalus, extra-axial collection or mass lesion/mass effect. Mild chronic white matter ischemic change and mild atrophy are seen. Vascular: No hyperdense vessel or unexpected calcification. Skull: Normal. Negative for fracture or focal lesion. Sinuses/Orbits: No acute finding. Other: None. IMPRESSION: No acute intracranial abnormality noted. Electronically Signed   By: Inez Catalina M.D.   On: 02/07/2016 19:40   Ct Chest Wo Contrast  Result Date: 03/05/2016 CLINICAL DATA:  Sepsis with abnormal finding in the right lung on recent chest x-ray EXAM: CT CHEST WITHOUT CONTRAST TECHNIQUE: Multidetector CT imaging of the chest was performed  following the standard protocol without IV contrast. COMPARISON:  Chest x-ray 03/04/2016. Chest x-ray 02/07/2016. Abdomen and pelvis CT 08/13/2015. FINDINGS: Cardiovascular: The heart is enlarged. Coronary artery calcification is noted. Ascending thoracic aorta measures 4.1 cm maximum diameter. Atherosclerotic calcification is noted in the wall of the thoracic aorta. Mediastinum/Nodes: Upper normal to mildly enlarged lymph nodes are scattered through the mediastinum. 9 mm short axis precarinal lymph node is identified on image 37 series 201. 13 mm short axis subcarinal lymph nodes seen image 52. Soft tissue fullness noted in both hilar regions which may be related to atelectasis although the degree of lymphadenopathy cannot be excluded on this noncontrast exam. The esophagus has normal imaging features. Lungs/Pleura: Interlobular septal thickening is identified in the upper and lower lobes bilaterally, with a slight right-sided predominance. There is some probable asymmetric pleural-parenchymal scarring left apex with associated calcification. Comparing back to previous chest x-ray from 02/02/2016 and abdomen CT from 08/13/2015, the patient has developed new small bilateral pleural effusions and nodularity associated with the right lung fissures. 3.4 x 4.4 cm lobulated masslike lesion is identified in the right major fissure posteriorly. This is associated with nodular opacity in the minor fissure which is contiguous. The dominant masslike component approaches water attenuation. Associated bilateral lower lobe collapse/consolidation is evident. Upper Abdomen: Unremarkable. Musculoskeletal: Bone windows reveal no worrisome lytic or sclerotic osseous lesions. IMPRESSION: 1. Findings on recent chest x-ray represent areas of low-attenuation masslike and nodular opacity involving the fissures of the right lung given the relatively rapid interval appearance, this would be unusual for neoplasm and the attenuation of this  disease is lower than would be expected for soft tissue. The Nodularity is atypical for simple pleural effusion. As such, malignant involvement of the pleural surface with effusion would be a consideration. Infection/empyema with extubated effusion in the fissure might also present with these features. 2. Bibasilar collapse/consolidation. 3. Interlobular septal thickening, right greater than left. This may be related to pulmonary edema or lymphangitic disease including neoplasm. 4. Borderline mediastinal lymphadenopathy. 5. Coronary artery and thoracic aortic atherosclerosis. Ascending thoracic aorta measures up to 4.1 cm  diameter. Recommend annual imaging followup by CTA or MRA. This recommendation follows 2010 ACCF/AHA/AATS/ACR/ASA/SCA/SCAI/SIR/STS/SVM Guidelines for the Diagnosis and Management of Patients with Thoracic Aortic Disease. Circulation. 2010; [id number] Electronically Signed   By: Misty Stanley M.D.   On: 03/05/2016 07:20   Mr Brain Wo Contrast  Result Date: 02/08/2016 CLINICAL DATA:  Fall.  Altered mental status. EXAM: MRI HEAD WITHOUT CONTRAST TECHNIQUE: Multiplanar, multiecho pulse sequences of the brain and surrounding structures were obtained without intravenous contrast. COMPARISON:  Head CT 02/07/2016 FINDINGS: Brain: No acute infarct or intraparenchymal hemorrhage. There is beginning confluent hyperintense T2-weighted signal within the periventricular white matter, mildly asymmetric T2 hyperintensity and volume loss of the left hippocampus. No mass lesion or midline shift. No hydrocephalus or extra-axial fluid collection. No age advanced atrophy. The midline structures are normal. Vascular: Major intracranial arterial and venous sinus flow voids are preserved. No evidence of chronic microhemorrhage or amyloid angiopathy. Skull and upper cervical spine: The visualized skull base, calvarium, upper cervical spine and extracranial soft tissues are normal. Sinuses/Orbits: No fluid levels  or advanced mucosal thickening. No mastoid effusion. Normal orbits. IMPRESSION: 1. No acute intracranial abnormality. 2. Mild atrophy and findings of chronic microvascular ischemia. Electronically Signed   By: Ulyses Jarred M.D.   On: 02/08/2016 05:21   Dg Chest Port 1 View  Result Date: 03/03/2016 CLINICAL DATA:  Altered mental status. EXAM: PORTABLE CHEST 1 VIEW COMPARISON:  Chest radiograph 02/07/2016 FINDINGS: Cardiomediastinal silhouette is enlarged. Mediastinal contours appear intact. Calcific atherosclerotic disease of the aorta noted. There is no evidence of pneumothorax. Low lung volumes with bibasilar atelectasis. Probable bilateral pleural effusions, greater on the left. Rounded density projecting over the lateral mid right thorax may represent loculated pleural effusion versus pulmonary or pleural based mass. Osseous structures are without acute abnormality. Soft tissues are grossly normal. Breast implants are noted. IMPRESSION: Probable bilateral pleural effusions, greater on the left with bibasilar atelectasis. Rounded density projecting over the lateral mid right hemithorax may represent loculated pleural effusion versus pulmonary or pleural based mass. Cross-sectional imaging is recommended if further evaluation is clinically desired. Electronically Signed   By: Fidela Salisbury M.D.   On: 03/03/2016 22:23   Dg Knee Complete 4 Views Right  Result Date: 02/07/2016 CLINICAL DATA:  Status post fall, with right knee pain. Initial encounter. EXAM: RIGHT KNEE - COMPLETE 4+ VIEW COMPARISON:  None. FINDINGS: There is no evidence of fracture or dislocation. A bone island is noted at the proximal tibia. The joint spaces are preserved. No significant degenerative change is seen; the patellofemoral joint is grossly unremarkable in appearance. No significant joint effusion is seen. The visualized soft tissues are normal in appearance. IMPRESSION: No evidence of fracture or dislocation. Electronically  Signed   By: Garald Balding M.D.   On: 02/07/2016 23:49   Dg Hips Bilat With Pelvis 3-4 Views  Result Date: 02/07/2016 CLINICAL DATA:  Bilateral hip pain following fall, initial encounter EXAM: DG HIP (WITH OR WITHOUT PELVIS) 3-4V BILAT COMPARISON:  None. FINDINGS: Pelvic ring is intact. No acute fracture or dislocation is noted. No soft tissue abnormality is noted. IMPRESSION: No acute abnormality noted. Electronically Signed   By: Inez Catalina M.D.   On: 02/07/2016 21:49     Christmas Faraci M.D on 03/05/2016 at 2:39 PM  Between 7am to 7pm - Pager - [phone]  After 7pm go to [email or web address] - password [facility]  [facility] -  Office  [phone]

## 2016-03-05 NOTE — Consult Note (Signed)
Name: Tracy Rodriguez MRN: [id number] DOB: June 22, 1931    ADMISSION DATE:  03/03/2016 CONSULTATION DATE:  03/05/16  REFERRING MD :  Dr. Waldron Labs   CHIEF COMPLAINT:  Altered Mental Status    HISTORY OF PRESENT ILLNESS:  82 y/o F, assisted living resident, with PMH of arthritis, multiple lumbar disc surgeries with chronic lower back pain, bipolar disorder, HTN, HLD PAF, CKD III, cancer of the right breast (remote) s/p mastectomy with implants who presented to [facility] on 12/24 with altered mental status and weakness.    At baseline, she is ambulatory with the assistance of a rolling walker.  Family reportedly feels she has some degree of dementia.  She had a recent hospitalization for hypertensive urgency, acute encephalopathy and urinary retention s/p placement of a foley that was in place until 12/4.The patient was last seen normal on the day of admit for her medications at 530 at the facility.  Around 9PM she was noted to be acutely altered. There was question of vomiting at the facility.  She was seen in the ER and found to be febrile to 105.6 (rectal), tachypneic and soft BP.  UA was concerning for UTI and she was treated with IV rocephin.  CXR raised concern of bilateral pleural effusion. Mental status improved with volume resuscitation.  She was admitted by [facility] for further evaluation of sepsis.  Later in the evening of 12/24 she became lethargic with soft blood pressures.  ABX therapy was broadened to vancomycin / zosyn and she was transferred to SDU.  Repeat CXR showed possible LUL PNA.  This was followed up with a CT of the chest 12/26 that showed a masslike and nodular opacity involving the fissures of the right lung, bibasilar collapse/consolidation, interlobular septal thickening R>L, borderline mediastinal LAN and ascending thoracic aorta measures up to 4.1 cm.    PCCM consulted for evaluation of abnormal CT chest.    Currently the patient reports swelling of lower extremities, cough with  minimal sputum production.  She reports swelling has been an ongoing issue.  She denies weight loss, night sweats, chills, hemoptysis, chest pain.  She is a never smoker.  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    PAST MEDICAL HISTORY :   has a past medical history of Arthritis; Bipolar 1 disorder ([place]); Cancer of right breast ([place]); Chronic lower back pain; Hyperlipemia; Hypertension; and PAF (paroxysmal atrial fibrillation) ([place]).   has a past surgical history that includes Cataract extraction w/ intraocular lens  implant, bilateral (Bilateral); Tonsillectomy; Excisional hemorrhoidectomy; Breast biopsy (Right); Mastectomy (Right); Breast reconstruction (Right); Placement of breast implants (Bilateral); Lumbar disc surgery (1972; 1973; 1985); Back surgery; and Vaginal hysterectomy.  Prior to Admission medications   Medication Sig Start Date End Date Taking? Authorizing Provider  acetaminophen (TYLENOL) 325 MG tablet Take 2 tablets (650 mg total) by mouth every 6 (six) hours as needed for mild pain (or Fever >/= 101). Patient taking differently: Take 325 mg by mouth every 6 (six) hours as needed for mild pain (or Fever >/= 101).  02/12/16  Yes Bonnielee Haff, MD  atorvastatin (LIPITOR) 20 MG tablet Take 2 tablets (40 mg total) by mouth daily. Patient taking differently: Take 20 mg by mouth daily.  02/12/16  Yes Bonnielee Haff, MD  carvedilol (COREG) 6.25 MG tablet Take 1 tablet (6.25 mg total) by mouth 2 (two) times daily with a meal. 02/12/16  Yes Bonnielee Haff, MD  Dextromethorphan Polistirex (DELSYM PO) Take 10 mLs by mouth  every 12 (twelve) hours as needed.   Yes Historical Provider, MD  digoxin (LANOXIN) 0.125 MG tablet Take 1 tablet (125 mcg total) by mouth daily. Patient taking differently: Take 0.125 mg by mouth daily.  01/02/16  Yes Marletta Lor, MD  diltiazem (DILACOR XR) 120 MG 24 hr capsule Take 120 mg by mouth daily.   Yes Historical Provider, MD   escitalopram (LEXAPRO) 10 MG tablet TAKE 1 TABLET(10 MG) BY MOUTH DAILY 02/08/16  Yes Marletta Lor, MD  ferrous sulfate 325 (65 FE) MG tablet Take 325 mg by mouth daily with breakfast.   Yes Historical Provider, MD  furosemide (LASIX) 40 MG tablet Take 0.5 tablets (20 mg total) by mouth daily. Patient taking differently: Take 40 mg by mouth daily.  02/12/16  Yes Bonnielee Haff, MD  gabapentin (NEURONTIN) 300 MG capsule Take 1 capsule (300 mg total) by mouth 2 (two) times daily. Patient taking differently: Take 300 mg by mouth 2 (two) times daily with a meal.  05/15/15  Yes Marletta Lor, MD  MYRBETRIQ 25 MG TB24 tablet TAKE 1 TABLET(25 MG) BY MOUTH DAILY 02/15/16  Yes Marletta Lor, MD  potassium chloride SA (K-DUR,KLOR-CON) 20 MEQ tablet Take 1 tablet (20 mEq total) by mouth daily. 02/12/16  Yes Bonnielee Haff, MD  QUEtiapine (SEROQUEL) 25 MG tablet Take 25 mg by mouth 2 (two) times daily with a meal.  02/15/16  Yes Historical Provider, MD  traMADol (ULTRAM) 50 MG tablet Take 1 tablet (50 mg total) by mouth every 6 (six) hours as needed for moderate pain. 02/12/16  Yes Bonnielee Haff, MD  traZODone (DESYREL) 150 MG tablet Take 150 mg by mouth at bedtime Patient taking differently: Take 150 mg by mouth at bedtime.  02/12/16  Yes Bonnielee Haff, MD  warfarin (COUMADIN) 4 MG tablet Take as directed by anticoagulation clinic. Patient taking differently: Take 2-4 mg by mouth See admin instructions. Take 1/2 tablet (2 mg) by mouth on Monday and Friday morning, take 1 tablet (4 mg) on Sunday, Tuesday, Wednesday, Thursday, Saturday or as directed by anticoagulation clinic. 02/28/16  Yes Marletta Lor, MD    Allergies  Allergen Reactions  . Codeine Anaphylaxis and Nausea And Vomiting    Note: pt has tramadol for years with no reaction  . Morphine And Related Nausea And Vomiting    Severe nausea per daughter  . Valium [Diazepam] Shortness Of Breath  . Sulfa Antibiotics Itching and  Rash  . Darvon [Propoxyphene] Nausea And Vomiting  . Floxin [Ofloxacin] Itching  . Levsin [Hyoscyamine Sulfate] Other (See Comments)    Unknown reaction  . Penicillins Itching    Itching reported by [facility] Has patient had a PCN reaction causing immediate rash, facial/tongue/throat swelling, SOB or lightheadedness with hypotension: Yes Has patient had a PCN reaction causing severe rash involving mucus membranes or skin necrosis: No Has patient had a PCN reaction that required hospitalization No Has patient had a PCN reaction occurring within the last 10 years: No If all of the above answers are "NO", then may proceed with Cephalosporin use.   . Zantac [Ranitidine Hcl] Other (See Comments)    Unknown reaction    FAMILY HISTORY:  family history includes Alzheimer's disease in her sister; Arthritis in her maternal grandmother; Breast cancer in her daughter; Heart disease in her father; Leukemia in her brother; Stroke in her mother.  SOCIAL HISTORY:  reports that she has never smoked. She has never used smokeless tobacco.  She reports that she does not drink alcohol or use drugs.  REVIEW OF SYSTEMS:  POSITIVES IN BOLD  Constitutional: Negative for fever, chills, weight loss, malaise/fatigue and diaphoresis.  HENT: Negative for hearing loss, ear pain, nosebleeds, congestion, sore throat, neck pain, tinnitus and ear discharge.   Eyes: Negative for blurred vision, double vision, photophobia, pain, discharge and redness.  Respiratory: Negative for cough, hemoptysis, sputum production, shortness of breath, wheezing and stridor.   Cardiovascular: Negative for chest pain, palpitations, orthopnea, claudication, leg swelling and PND.  Gastrointestinal: Negative for heartburn, nausea, vomiting, abdominal pain, diarrhea, constipation, blood in stool and melena.  Genitourinary: Negative for dysuria, urgency, frequency, hematuria and flank pain.  Musculoskeletal: Negative for  myalgias, back pain, joint pain and falls.  Skin: Negative for itching and rash.  Neurological: Negative for dizziness, tingling, tremors, sensory change, speech change, focal weakness, seizures, loss of consciousness, weakness and headaches.  Endo/Heme/Allergies: Negative for environmental allergies and polydipsia. Does not bruise/bleed easily.  SUBJECTIVE:   VITAL SIGNS: Temp:  [97.2 F (36.2 C)-98.4 F (36.9 C)] 98.2 F (36.8 C) (12/26 1207) Pulse Rate:  [65-87] 83 (12/26 1207) Resp:  [17-27] 19 (12/26 1207) BP: (126-198)/(65-111) 168/83 (12/26 1207) SpO2:  [91 %-97 %] 97 % (12/26 1207) Weight:  [180 lb 8.9 oz (81.9 kg)] 180 lb 8.9 oz (81.9 kg) (12/26 0413)  PHYSICAL EXAMINATION: General:  Elderly female in NAD lying in bed Neuro:  Awake, alert to self, place, time, events, occasionally delaying in answering or appears to be "covering" when she doesn't know an answer HEENT:  MM pink/moist, no jvd Cardiovascular:  s1s2 rrr, no m/r/g  Lungs:   Even/non-labored, lungs bilaterally clear anterior, diminished lower posterior/lateral  Abdomen:  Obese/soft, bsx4 active  Musculoskeletal:  No acute deformities  Skin:  War/dry, trace LE edema   Recent Labs Lab 03/03/16 2142 03/05/16 0401  NA 137 140  K 3.9 4.0  CL 106 111  CO2 24 20*  BUN 19 25*  CREATININE 1.51* 1.91*  GLUCOSE 143* 102*     Recent Labs Lab 03/03/16 2142 03/04/16 0211 03/04/16 1357 03/05/16 0401  HGB 10.9* 9.2* 9.5* 10.0*  HCT 34.6* 29.6* 29.7* 32.5*  WBC 11.2* 14.9*  --  10.9*  PLT 168 156  --  168    Dg Chest 1 View  Result Date: 03/04/2016 CLINICAL DATA:  SOB since this morning. EXAM: CHEST 1 VIEW COMPARISON:  1 day prior FINDINGS: Patient rotated to the left. Cardiomegaly accentuated by AP portable technique. Small left pleural effusion. No pneumothorax. Mild interstitial edema is new or increased. Improved bibasilar atelectasis. Inferior right upper lobe opacity is not significantly changed and  measures on the order of 3.8 cm. IMPRESSION: Development of mild interstitial edema. Persistent left pleural effusion with decreased bibasilar atelectasis. Right upper lobe rounded density is similar to on the most recent exam. Not readily apparent on 02/07/2016, arguing against a neoplastic process. Possibly an area of loculated pleural fluid. Consider further evaluation with repeat PA and lateral radiographs. Electronically Signed   By: Abigail Miyamoto M.D.   On: 03/04/2016 08:46   Ct Head Wo Contrast  Result Date: 03/03/2016 CLINICAL DATA:  Altered mental status. EXAM: CT HEAD WITHOUT CONTRAST TECHNIQUE: Contiguous axial images were obtained from the base of the skull through the vertex without intravenous contrast. COMPARISON:  CT 02/07/2016, MRI 02/08/2016 FINDINGS: Brain: No evidence of acute infarction, hemorrhage, hydrocephalus, extra-axial collection or mass lesion/mass effect. Stable degree of atrophy and chronic small vessel ischemia.  Vascular: Atherosclerosis of skullbase vasculature without hyperdense vessel or abnormal calcification. Skull: Normal. Negative for fracture or focal lesion. Sinuses/Orbits: Post bilateral cataract resection. Mild chronic mucosal thickening in the paranasal sinuses. No fluid levels. Mastoid air cells are well aerated. Other: None. IMPRESSION: No acute intracranial abnormality. Electronically Signed   By: Jeb Levering M.D.   On: 03/03/2016 23:10   Ct Chest Wo Contrast  Result Date: 03/05/2016 CLINICAL DATA:  Sepsis with abnormal finding in the right lung on recent chest x-ray EXAM: CT CHEST WITHOUT CONTRAST TECHNIQUE: Multidetector CT imaging of the chest was performed following the standard protocol without IV contrast. COMPARISON:  Chest x-ray 03/04/2016. Chest x-ray 02/07/2016. Abdomen and pelvis CT 08/13/2015. FINDINGS: Cardiovascular: The heart is enlarged. Coronary artery calcification is noted. Ascending thoracic aorta measures 4.1 cm maximum diameter.  Atherosclerotic calcification is noted in the wall of the thoracic aorta. Mediastinum/Nodes: Upper normal to mildly enlarged lymph nodes are scattered through the mediastinum. 9 mm short axis precarinal lymph node is identified on image 37 series 201. 13 mm short axis subcarinal lymph nodes seen image 52. Soft tissue fullness noted in both hilar regions which may be related to atelectasis although the degree of lymphadenopathy cannot be excluded on this noncontrast exam. The esophagus has normal imaging features. Lungs/Pleura: Interlobular septal thickening is identified in the upper and lower lobes bilaterally, with a slight right-sided predominance. There is some probable asymmetric pleural-parenchymal scarring left apex with associated calcification. Comparing back to previous chest x-ray from 02/02/2016 and abdomen CT from 08/13/2015, the patient has developed new small bilateral pleural effusions and nodularity associated with the right lung fissures. 3.4 x 4.4 cm lobulated masslike lesion is identified in the right major fissure posteriorly. This is associated with nodular opacity in the minor fissure which is contiguous. The dominant masslike component approaches water attenuation. Associated bilateral lower lobe collapse/consolidation is evident. Upper Abdomen: Unremarkable. Musculoskeletal: Bone windows reveal no worrisome lytic or sclerotic osseous lesions. IMPRESSION: 1. Findings on recent chest x-ray represent areas of low-attenuation masslike and nodular opacity involving the fissures of the right lung given the relatively rapid interval appearance, this would be unusual for neoplasm and the attenuation of this disease is lower than would be expected for soft tissue. The Nodularity is atypical for simple pleural effusion. As such, malignant involvement of the pleural surface with effusion would be a consideration. Infection/empyema with extubated effusion in the fissure might also present with these  features. 2. Bibasilar collapse/consolidation. 3. Interlobular septal thickening, right greater than left. This may be related to pulmonary edema or lymphangitic disease including neoplasm. 4. Borderline mediastinal lymphadenopathy. 5. Coronary artery and thoracic aortic atherosclerosis. Ascending thoracic aorta measures up to 4.1 cm diameter. Recommend annual imaging followup by CTA or MRA. This recommendation follows 2010 ACCF/AHA/AATS/ACR/ASA/SCA/SCAI/SIR/STS/SVM Guidelines for the Diagnosis and Management of Patients with Thoracic Aortic Disease. Circulation. 2010; [id number] Electronically Signed   By: Misty Stanley M.D.   On: 03/05/2016 07:20   Dg Chest Port 1 View  Result Date: 03/03/2016 CLINICAL DATA:  Altered mental status. EXAM: PORTABLE CHEST 1 VIEW COMPARISON:  Chest radiograph 02/07/2016 FINDINGS: Cardiomediastinal silhouette is enlarged. Mediastinal contours appear intact. Calcific atherosclerotic disease of the aorta noted. There is no evidence of pneumothorax. Low lung volumes with bibasilar atelectasis. Probable bilateral pleural effusions, greater on the left. Rounded density projecting over the lateral mid right thorax may represent loculated pleural effusion versus pulmonary or pleural based mass. Osseous structures are without acute abnormality. Soft tissues are  grossly normal. Breast implants are noted. IMPRESSION: Probable bilateral pleural effusions, greater on the left with bibasilar atelectasis. Rounded density projecting over the lateral mid right hemithorax may represent loculated pleural effusion versus pulmonary or pleural based mass. Cross-sectional imaging is recommended if further evaluation is clinically desired. Electronically Signed   By: Fidela Salisbury M.D.   On: 03/03/2016 22:23      SIGNIFICANT EVENTS  12/24 Admit with AMS, fever, concern for UTI  12/26 PCCM consulted for abnormal CT findings  STUDIES:  CT Head 12/24 >> negative  CT of the chest 12/26  >> masslike and nodular opacity involving the fissures of the right lung, bibasilar collapse/consolidation, interlobular septal thickening R>L, borderline mediastinal LAN and ascending thoracic aorta measures up to 4.1 cm (recommended for follow up in one year).    CULTURES: BCx2 12/24 >>  UC 12/24 >> 100k E-Coli >>   ANTIBIOTICS:  Azithro 12/25 >> Cefepime 12/25 >> Vanco 12/25 >>  ASSESSMENT / PLAN:  82 y/o F admitted 12/24 with AMS, fever and concern for UTI.  Urine culture growing E-Coli.  Also note to have cough with ?  pleural effusions vs infiltrate.  CT follow up showed masslike opacities in the right fissures that were not present on imaging several months ago.     1.  Mass-like Opacities in R Fissure - mass vs pleural fluid with intralobular septal thickening.  DDx includes CHF (favor diastolic dysfunction) in the setting of sepsis resuscitation and metastases (remote hx of breast cancer)  Plan: Consider gentle diuresis when sr cr allows BP / Rate control   Follow up in pulmonary office as arranged Will need repeat CT imaging after diuresis, office will arrange  Trend CXR  Doubt area is amenable to FOB for assessment  Reviewed location / fluid with IR and they would not be able to do needle biopsy if needed  2.  Questionable CAP   Plan: ABX per primary  Pulmonary hygiene - IS, mobilize Continue Mucinex   3. E-Coli UTI   Plan: Continue abx per primary  Monitor cultures, await sensitivities   4. CKD III   Plan: Trend BMP / UOP   5.  Chronic Diastolic CHF   Plan: Gentle diuresis as able   6.  PAF   Plan: Coumadin Per primary   GOC:  DNR   Noe Gens, NP-C [facility] Pgr: [phone] or if no answer [phone] 03/05/2016, 3:28 PM  Attending Note:  82 year old female with history of breast cancer 50 years ago who presents with a lung "mass".  On exam, lungs with bibasilar crackles.  Chest CT that I reviewed myself, loculated  fluid in the fissure with some associated nodularities and mediastinal LAN.  I reviewed the chest CT with Dr. Earleen Newport from IR.  The "mass" is definitely fluid in the fissure.  It would be difficult to approach bronchoscopically or with CT guidance.  More importantly, given its rate of evolution (was not there 5 months ago) makes it very unlikely to be cancer.  However, the nodules in there are concerning.  Given age, acute current illness (sepsis from a UTI) and location, after discussion, we decided to diurese patient, get to dry weight if possible then repeat CT in 4-6 wks f/u with Dr. Ashok Cordia on 04/16/2016 at 10:15 AM.  Lung mass:             - See recommendations above.  Hypoxemia:             -  Titrate O2 for sat of 88-92%             - May need an ambulatory desaturation study for home O2 if remains hypoxemic  Pulmonary edema:             - Diureses             - Treat heart failure  Pleural effusion: not big enough to tap             - Diureses             - Strict I/O.  PCCM will sign off, please call back if needed.  Patient seen and examined, agree with above note.  I dictated the care and orders written for this patient under my direction.  Rush Farmer, MD [phone]

## 2016-03-06 ENCOUNTER — Inpatient Hospital Stay (HOSPITAL_COMMUNITY): Payer: Medicare Other

## 2016-03-06 DIAGNOSIS — I9589 Other hypotension: Secondary | ICD-10-CM

## 2016-03-06 LAB — BASIC METABOLIC PANEL
Anion gap: 10 (ref 5–15)
BUN: 15 mg/dL (ref 6–20)
CO2: 20 mmol/L — ABNORMAL LOW (ref 22–32)
Calcium: 8.6 mg/dL — ABNORMAL LOW (ref 8.9–10.3)
Chloride: 111 mmol/L (ref 101–111)
Creatinine, Ser: 1.1 mg/dL — ABNORMAL HIGH (ref 0.44–1.00)
GFR calc Af Amer: 53 mL/min — ABNORMAL LOW (ref >60)
GFR calc non Af Amer: 45 mL/min — ABNORMAL LOW (ref >60)
Glucose, Bld: 140 mg/dL — ABNORMAL HIGH (ref 65–99)
Potassium: 3.7 mmol/L (ref 3.5–5.1)
Sodium: 141 mmol/L (ref 135–145)

## 2016-03-06 LAB — ECHOCARDIOGRAM COMPLETE
Height: 69 in
Weight: 2888.91 oz

## 2016-03-06 LAB — URINE CULTURE: Culture: >=100000 — AB

## 2016-03-06 LAB — PROTIME-INR
INR: 2.32
Prothrombin Time: 25.9 seconds — ABNORMAL HIGH (ref 11.4–15.2)

## 2016-03-06 LAB — CBC
HCT: 35.1 % — ABNORMAL LOW (ref 36.0–46.0)
Hemoglobin: 11.2 g/dL — ABNORMAL LOW (ref 12.0–15.0)
MCH: 26.4 pg (ref 26.0–34.0)
MCHC: 31.9 g/dL (ref 30.0–36.0)
MCV: 82.6 fL (ref 78.0–100.0)
Platelets: 218 10*3/uL (ref 150–400)
RBC: 4.25 MIL/uL (ref 3.87–5.11)
RDW: 20 % — ABNORMAL HIGH (ref 11.5–15.5)
WBC: 10.8 10*3/uL — ABNORMAL HIGH (ref 4.0–10.5)

## 2016-03-06 LAB — UREA NITROGEN, URINE: Urea Nitrogen, Ur: 178 mg/dL

## 2016-03-06 LAB — LEGIONELLA PNEUMOPHILA SEROGP 1 UR AG: L. pneumophila Serogp 1 Ur Ag: NEGATIVE

## 2016-03-06 MED ORDER — AZITHROMYCIN 500 MG PO TABS
500.0000 mg | ORAL_TABLET | Freq: Every day | ORAL | Status: DC
  Administered 2016-03-07 – 2016-03-09 (×3): 500 mg via ORAL
  Filled 2016-03-06 (×3): qty 1

## 2016-03-06 MED ORDER — QUETIAPINE FUMARATE 25 MG PO TABS
25.0000 mg | ORAL_TABLET | Freq: Two times a day (BID) | ORAL | Status: DC
  Administered 2016-03-06 – 2016-03-09 (×7): 25 mg via ORAL
  Filled 2016-03-06 (×7): qty 1

## 2016-03-06 MED ORDER — CEFTRIAXONE SODIUM 2 G IJ SOLR
2.0000 g | INTRAMUSCULAR | Status: DC
  Administered 2016-03-07 – 2016-03-08 (×2): 2 g via INTRAVENOUS
  Filled 2016-03-06 (×4): qty 2

## 2016-03-06 MED ORDER — HALOPERIDOL LACTATE 5 MG/ML IJ SOLN
INTRAMUSCULAR | Status: AC
  Filled 2016-03-06: qty 1

## 2016-03-06 MED ORDER — HYDRALAZINE HCL 25 MG PO TABS
25.0000 mg | ORAL_TABLET | Freq: Four times a day (QID) | ORAL | Status: DC
  Administered 2016-03-06 – 2016-03-09 (×11): 25 mg via ORAL
  Filled 2016-03-06 (×11): qty 1

## 2016-03-06 MED ORDER — BISACODYL 10 MG RE SUPP
10.0000 mg | Freq: Every day | RECTAL | Status: DC | PRN
  Administered 2016-03-06: 10 mg via RECTAL
  Filled 2016-03-06: qty 1

## 2016-03-06 MED ORDER — ESCITALOPRAM OXALATE 10 MG PO TABS
10.0000 mg | ORAL_TABLET | Freq: Every day | ORAL | Status: DC
  Administered 2016-03-06 – 2016-03-09 (×4): 10 mg via ORAL
  Filled 2016-03-06 (×5): qty 1

## 2016-03-06 MED ORDER — HALOPERIDOL LACTATE 5 MG/ML IJ SOLN
2.5000 mg | Freq: Once | INTRAMUSCULAR | Status: AC
  Administered 2016-03-06: 2.5 mg via INTRAVENOUS

## 2016-03-06 MED ORDER — FUROSEMIDE 10 MG/ML IJ SOLN
40.0000 mg | Freq: Two times a day (BID) | INTRAMUSCULAR | Status: DC
  Administered 2016-03-06 – 2016-03-09 (×7): 40 mg via INTRAVENOUS
  Filled 2016-03-06 (×7): qty 4

## 2016-03-06 MED ORDER — FLEET ENEMA 7-19 GM/118ML RE ENEM
1.0000 | ENEMA | Freq: Every day | RECTAL | Status: DC | PRN
  Administered 2016-03-06: 1 via RECTAL
  Filled 2016-03-06: qty 1

## 2016-03-06 NOTE — Evaluation (Signed)
Physical Therapy Evaluation Patient Details Name: Tracy Rodriguez MRN: [id number] DOB: [birth year] Today's Date: 03/06/2016   History of Present Illness  Patient is a 82 y/o female with hx of HTN, HLD, depressionm PAF, breast ca s/p right mastectomy, bipolar disorder, chronic back pain, possible dementia presents with AMS. Found to have sepsis secondary to UTI.  Clinical Impression  Patient presents with generalized weakness, confusion, perseveration, distractibility, and impaired mobility s/p above. Tolerated SPT bed to/from chair with Min A for balance/safety. Pt perseverating on not swallowing her spit and spitting up everywhere. Able to be redirected at times to swallow but reverts back to not swallowing. No family members present to provide PLOF/history. Would benefit from SNF to maximize independence and mobility prior to return ALF unless [facility] can provide hands on assist for all mobility. Pt high fall risk. Will follow acutely.    Follow Up Recommendations SNF;Supervision/Assistance - 24 hour    Equipment Recommendations  None recommended by PT    Recommendations for Other Services OT consult     Precautions / Restrictions Precautions Precautions: Fall Precaution Comments: h/o multiple falls Restrictions Weight Bearing Restrictions: No      Mobility  Bed Mobility Overal bed mobility: Needs Assistance Bed Mobility: Supine to Sit     Supine to sit: Min assist     General bed mobility comments: Assist to elevate trunk to get to EOB.   Transfers Overall transfer level: Needs assistance Equipment used: Rolling walker (2 wheeled);None Transfers: Sit to/from Stand Sit to Stand: Min assist Stand pivot transfers: Min assist       General transfer comment: min A to rise, min A for SPT to BSC, used RW to return back to bed with assist with RW management.   Ambulation/Gait             General Gait Details: NA as pt distracted.   Stairs             Wheelchair Mobility    Modified Rankin (Stroke Patients Only)       Balance Overall balance assessment: Needs assistance Sitting-balance support: Feet supported;No upper extremity supported Sitting balance-Leahy Scale: Fair     Standing balance support: During functional activity;Single extremity supported Standing balance-Leahy Scale: Fair Standing balance comment: RW for support or UE support during dynamic activities.                              Pertinent Vitals/Pain Pain Assessment: Faces Pain Score: 0-No pain    Home Living Family/patient expects to be discharged to:: Assisted living ([facility])               Home Equipment: Gilford Rile - 2 wheels;Walker - 4 wheels      Prior Function Level of Independence: Needs assistance   Gait / Transfers Assistance Needed: used rollator to walk to dining room up until 2 months ago  ADL's / Homemaking Assistance Needed: recently has needed assist for ADL.  Comments: Information gathered from prior admission 3 weeks ago.     Hand Dominance        Extremity/Trunk Assessment   Upper Extremity Assessment Upper Extremity Assessment: Defer to OT evaluation;Generalized weakness    Lower Extremity Assessment Lower Extremity Assessment: Generalized weakness    Cervical / Trunk Assessment Cervical / Trunk Assessment: Normal  Communication   Communication: No difficulties  Cognition Arousal/Alertness: Awake/alert Behavior During Therapy: Flat affect Overall Cognitive Status: Impaired/Different from baseline Area of  Impairment: Safety/judgement     Memory: Decreased short-term memory Following Commands: Follows multi-step commands with increased time     Problem Solving:  (Distracted but able to be redirected inconsistently.) General Comments: Pt perseverating on not swallowing her spit, but able to swallow on command when OOB. once back in bed, pt spitting up her own spit and holding yanker  suction to mouth. Per RN, pt perseverating on having a BM and had stool all over her hands, bed, rail prior to PT arrival.     General Comments General comments (skin integrity, edema, etc.): No family members present during session.    Exercises     Assessment/Plan    PT Assessment Patient needs continued PT services  PT Problem List Decreased activity tolerance;Decreased strength;Decreased mobility;Decreased balance;Decreased cognition;Decreased safety awareness;Pain          PT Treatment Interventions Gait training;Functional mobility training;Balance training;Therapeutic exercise;Therapeutic activities;Patient/family education    PT Goals (Current goals can be found in the Care Plan section)  Acute Rehab PT Goals Patient Stated Goal: none stated PT Goal Formulation: Patient unable to participate in goal setting Time For Goal Achievement: 03/20/16 Potential to Achieve Goals: Fair    Frequency Min 2X/week   Barriers to discharge        Co-evaluation               End of Session Equipment Utilized During Treatment: Gait belt Activity Tolerance: Other (comment) (confusion) Patient left: in bed;with call bell/phone within reach;with bed alarm set Nurse Communication: Mobility status         Time: [id number] PT Time Calculation (min) (ACUTE ONLY): 31 min   Charges:   PT Evaluation $PT Eval Moderate Complexity: 1 Procedure PT Treatments $Therapeutic Activity: 8-22 mins   PT G Codes:        Toa Mia A Mavric Cortright 03/06/2016, 3:18 PM Wray Kearns, [place], DPT [phone]

## 2016-03-06 NOTE — Progress Notes (Signed)
Patient family concerned about patient not taking seroquel meds. Patient agitated intermittently confused NP notified and orders received will continue to monitor patient closely.

## 2016-03-06 NOTE — Progress Notes (Signed)
Patient BP 177/78 too early to administer prn Hydralazine 10mg  IV. MD paged awaiting response.

## 2016-03-06 NOTE — Progress Notes (Signed)
  Echocardiogram 2D Echocardiogram has been performed.  Tracy Rodriguez 03/06/2016, 12:33 PM

## 2016-03-06 NOTE — Progress Notes (Signed)
PROGRESS NOTE                                                                                                                                                                                                             Patient Demographics:    Tracy Rodriguez, is a 82 y.o. female, DOB - 18-Jun-1931, [id number]  Admit date - 03/03/2016   Admitting Physician Norval Morton, MD  Outpatient Primary MD for the patient is [name], MD  LOS - 2  Chief Complaint  Patient presents with  . Altered Mental Status       Brief Narrative   82 y.o. female with medical history significant of HTN, HLD, PAF, breast cancer s/p right mastectomy, CKD stage III, and bipolar disorder; who presents with acute altered mental status, Workup significant for sepsis secondary to UTI and pneumonia.   Subjective:    [place] today has, No headache, No chest pain, No abdominal pain - No Nausea, Report generalized weakness, reported cough, denies any urinary symptoms.  Assessment  & Plan :    Principal Problem:   Sepsis ([place]) Active Problems:   Bipolar disorder ([place])   Paroxysmal atrial fibrillation ([place])   Acute kidney injury ([place])   Abnormal chest x-ray   Acute renal failure superimposed on stage 3 chronic kidney disease (H[facility])   CHF (congestive heart failure) (H[facility])   Anemia   Sepsis  - Continue suspected UTI and CAP , he presents with a rectal temperature of 105.6 , respirations up to 24, WBC 11.2, and lactic acid 1.32. With altered mental status . - Workup significant for positive urinalysis, she reports cough, and possible new left upper lung pneumonia . - Sepsis protocol initiated , hypotensive requiring multiple fluid boluses, appears to be improving, hold antihypertensive medication and Lasix  UTI - Urine culture growing coli pansensitive, initially on cefepime, will transition to Rocephin .   CAP - Chest x-ray with right upper lung  opacity, CT chest significant for masslike right lung nodularity, volume overload versus neoplasm versus infection, and interlobular septal thickening , pulmonary consulted for further evaluation regarding these findings ., The plan is to continue with diuresis, and anterior to treat possible infection, and will need repeat CT after appropriate diuresis, follow-up appointment as an outpatient has been arranged by PCCM. - Initially on broad-spectrum antibiotics when  she septic, IV vancomycin, cefepime, and azithromycin , currently narrowed to IV Rocephin and azithromycin . - Patient with significant cough and secretion, encourage pulmonary toilet, incentive spirometry, flutter valve, and Mucinex   Elevated troponins - She denies any chest pain, non-ACS pattern, most likely related to sepsis and hypotension - 2-D echo with no wall motion abnormalities  Acute kidney injury on chronic kidney disease stage III:  - Creatinine baseline around 1.09 , elevated creatinine on admission , today is 1.9, this is most likely due to ATN from hypotension and sepsis, continue with hydration   chronic diastolic CHF  - Patient notes lower extremity swelling and new cough. CXR abnormal with b/l pleural effusions. Last echocardiogram showed EF of 65-75% on 02/09/2016, repeat echo with stable EF, no wall motion abnormality, and grade 1 diastolic dysfunction - continuous pulse oximetry with nasal cannula oxygen to keep O2 sats patient greater 92%. - Continue with IV diuresis  Paroxysmal atrial fibrillation on anticoagulation: - INR therapeutic at 2.37. Currently rate controlled at this time.  - Continue  digoxin, Cardizem and metoprolol and warfarin per pharmacy  Bipolar disorder - Resume home medication  Delirium - Patient with sundowning, would resume her home Seroquel.  Essential hypertension - Initially hypotensive requiring fluid boluses, recurrence of hypertension, blood pressure uncontrolled, resume  back on her home meds, as well on when necessary hydralazine .   Hyperlipidemia - Continue atorvastatin  Anemia:  - continue to monitor    Code Status : DO NOT RESUSCITATE  Family Communication  : Daughter's at bedside  Disposition Plan  : PT consulted, as per family arrangement for ALF is been made, but he likely will need SNF  Consults  :  None  Procedures  : none  DVT Prophylaxis  : On warfarin  Lab Results  Component Value Date   PLT 218 03/06/2016    Antibiotics  :    Anti-infectives    Start     Dose/Rate Route Frequency Ordered Stop   03/07/16 1000  azithromycin (ZITHROMAX) tablet 500 mg     500 mg Oral Daily 03/06/16 1112     03/06/16 1300  cefTRIAXone (ROCEPHIN) 2 g in dextrose 5 % 50 mL IVPB     2 g 100 mL/hr over 30 Minutes Intravenous Every 24 hours 03/06/16 1112     03/06/16 0500  ceFEPIme (MAXIPIME) 1 g in dextrose 5 % 50 mL IVPB  Status:  Discontinued     1 g 100 mL/hr over 30 Minutes Intravenous Every 24 hours 03/05/16 1139 03/06/16 1112   03/05/16 0500  azithromycin (ZITHROMAX) 500 mg in dextrose 5 % 250 mL IVPB  Status:  Discontinued     500 mg 250 mL/hr over 60 Minutes Intravenous Every 24 hours 03/04/16 1002 03/06/16 1112   03/05/16 0500  vancomycin (VANCOCIN) IVPB 750 mg/150 ml premix  Status:  Discontinued     750 mg 150 mL/hr over 60 Minutes Intravenous Every 24 hours 03/04/16 1013 03/06/16 1112   03/05/16 0500  ceFEPIme (MAXIPIME) 2 g in dextrose 5 % 50 mL IVPB  Status:  Discontinued     2 g 100 mL/hr over 30 Minutes Intravenous Every 24 hours 03/04/16 1014 03/05/16 1139   03/04/16 0300  vancomycin (VANCOCIN) IVPB 1000 mg/200 mL premix     1,000 mg 200 mL/hr over 60 Minutes Intravenous STAT 03/04/16 0203 03/04/16 0527   03/04/16 0230  azithromycin (ZITHROMAX) 500 mg in dextrose 5 % 250 mL IVPB  500 mg 250 mL/hr over 60 Minutes Intravenous STAT 03/04/16 0204 03/04/16 0528   03/04/16 0115  ceFEPIme (MAXIPIME) 2 g in dextrose 5 % 50 mL  IVPB  Status:  Discontinued     2 g 100 mL/hr over 30 Minutes Intravenous Every 24 hours 03/04/16 0041 03/04/16 1014   03/03/16 2300  cefTRIAXone (ROCEPHIN) 1 g in dextrose 5 % 50 mL IVPB     1 g 100 mL/hr over 30 Minutes Intravenous  Once 03/03/16 2252 03/04/16 0107        Objective:   Vitals:   03/06/16 0553 03/06/16 0646 03/06/16 0700 03/06/16 1111  BP: (!) 161/81 (!) 177/78 (!) 189/85 (!) 179/87  Pulse:  78 80 71  Resp:  18 (!) 24 (!) 21  Temp:   98.7 F (37.1 C) 98.7 F (37.1 C)  TempSrc:   Oral Oral  SpO2:  96% 95% 96%  Weight:      Height:        Wt Readings from Last 3 Encounters:  03/06/16 81.9 kg (180 lb 8.9 oz)  02/11/16 78.2 kg (172 lb 4.8 oz)  01/12/16 77.2 kg (170 lb 3.2 oz)     Intake/Output Summary (Last 24 hours) at 03/06/16 1449 Last data filed at 03/06/16 0803  Gross per 24 hour  Intake          1441.67 ml  Output             2350 ml  Net          -908.33 ml     Physical Exam  Awake Alert, confused [place].AT,PERRAL Supple Neck,No JVD Symmetrical Chest wall movement, Good air movement bilaterally, CTAB RRR,No Gallops,Rubs or new Murmurs, No Parasternal Heave +ve B.Sounds, Abd Soft, No tenderness,  No rebound - guarding or rigidity. No Cyanosis, Clubbing or edema, No new Rash or bruise      Data Review:    CBC  Recent Labs Lab 03/03/16 2142 03/04/16 0211 03/04/16 1357 03/05/16 0401 03/06/16 0501  WBC 11.2* 14.9*  --  10.9* 10.8*  HGB 10.9* 9.2* 9.5* 10.0* 11.2*  HCT 34.6* 29.6* 29.7* 32.5* 35.1*  PLT 168 156  --  168 218  MCV 85.6 84.6  --  86.9 82.6  MCH 27.0 26.3  --  26.7 26.4  MCHC 31.5 31.1  --  30.8 31.9  RDW 20.1* 19.9*  --  20.8* 20.0*  LYMPHSABS 0.4*  --   --   --   --   MONOABS 0.2  --   --   --   --   EOSABS 0.0  --   --   --   --   BASOSABS 0.0  --   --   --   --     Chemistries   Recent Labs Lab 03/03/16 2142 03/05/16 0401 03/06/16 0501  NA 137 140 141  K 3.9 4.0 3.7  CL 106 111 111  CO2 24 20* 20*    GLUCOSE 143* 102* 140*  BUN 19 25* 15  CREATININE 1.51* 1.91* 1.10*  CALCIUM 8.1* 8.2* 8.6*  AST 28  --   --   ALT 21  --   --   ALKPHOS 64  --   --   BILITOT 0.7  --   --    ------------------------------------------------------------------------------------------------------------------ No results for input(s): CHOL, HDL, LDLCALC, TRIG, CHOLHDL, LDLDIRECT in the last 72 hours.  Lab Results  Component Value Date   HGBA1C 5.2 02/08/2016   ------------------------------------------------------------------------------------------------------------------  Recent Labs  03/03/16 2204  TSH 1.301   ------------------------------------------------------------------------------------------------------------------ No results for input(s): VITAMINB12, FOLATE, FERRITIN, TIBC, IRON, RETICCTPCT in the last 72 hours.  Coagulation profile  Recent Labs Lab 03/03/16 2142 03/04/16 0211 03/05/16 0401 03/06/16 0501  INR 2.37 2.34 2.39 2.32    No results for input(s): DDIMER in the last 72 hours.  Cardiac Enzymes  Recent Labs Lab 03/04/16 0758 03/04/16 1357 03/04/16 1822  TROPONINI 0.29* 0.17* 0.12*   ------------------------------------------------------------------------------------------------------------------    Component Value Date/Time   BNP 420.9 (H) 03/03/2016 2142    Inpatient Medications  Scheduled Meds: . atorvastatin  20 mg Oral Daily  . [START ON 03/07/2016] azithromycin  500 mg Oral Daily  . carvedilol  6.25 mg Oral BID WC  . cefTRIAXone (ROCEPHIN)  IV  2 g Intravenous Q24H  . diltiazem  120 mg Oral Daily  . escitalopram  10 mg Oral Daily  . ferrous sulfate  325 mg Oral Q breakfast  . furosemide  40 mg Intravenous BID  . guaiFENesin  1,200 mg Oral BID  . mouth rinse  15 mL Mouth Rinse BID  . potassium chloride SA  20 mEq Oral Daily  . QUEtiapine  25 mg Oral BID  . traZODone  150 mg Oral QHS  . warfarin  2 mg Oral Once per day on Mon Fri  . warfarin  4  mg Oral Once per day on Sun Tue Wed Thu Sat  . Warfarin - Pharmacist Dosing Inpatient   Does not apply q1800   Continuous Infusions: . sodium chloride 50 mL/hr at 03/05/16 1450   PRN Meds:.acetaminophen **OR** acetaminophen, albuterol, bisacodyl, hydrALAZINE, ondansetron **OR** ondansetron (ZOFRAN) IV, sodium phosphate  Micro Results Recent Results (from the past 240 hour(s))  Blood Culture (routine x 2)     Status: None (Preliminary result)   Collection Time: 03/03/16  9:35 PM  Result Value Ref Range Status   Specimen Description BLOOD LEFT ANTECUBITAL  Final   Special Requests IN PEDIATRIC BOTTLE 4CC  Final   Culture NO GROWTH 3 DAYS  Final   Report Status PENDING  Incomplete  Blood Culture (routine x 2)     Status: None (Preliminary result)   Collection Time: 03/03/16  9:43 PM  Result Value Ref Range Status   Specimen Description BLOOD RIGHT ARM  Final   Special Requests BOTTLES DRAWN AEROBIC AND ANAEROBIC 10CC EA  Final   Culture NO GROWTH 3 DAYS  Final   Report Status PENDING  Incomplete  Urine culture     Status: Abnormal   Collection Time: 03/03/16 10:00 PM  Result Value Ref Range Status   Specimen Description URINE, CATHETERIZED  Final   Special Requests NONE  Final   Culture >=100,000 COLONIES/mL ESCHERICHIA COLI (A)  Final   Report Status 03/06/2016 FINAL  Final   Organism ID, Bacteria ESCHERICHIA COLI (A)  Final      Susceptibility   Escherichia coli - MIC*    AMPICILLIN <=2 SENSITIVE Sensitive     CEFAZOLIN <=4 SENSITIVE Sensitive     CEFTRIAXONE <=1 SENSITIVE Sensitive     CIPROFLOXACIN <=0.25 SENSITIVE Sensitive     GENTAMICIN <=1 SENSITIVE Sensitive     IMIPENEM <=0.25 SENSITIVE Sensitive     NITROFURANTOIN <=16 SENSITIVE Sensitive     TRIMETH/SULFA <=20 SENSITIVE Sensitive     AMPICILLIN/SULBACTAM <=2 SENSITIVE Sensitive     PIP/TAZO <=4 SENSITIVE Sensitive     Extended ESBL NEGATIVE Sensitive     * >=100,000 COLONIES/mL ESCHERICHIA  COLI  MRSA PCR  Screening     Status: None   Collection Time: 03/04/16  4:01 AM  Result Value Ref Range Status   MRSA by PCR NEGATIVE NEGATIVE Final    Comment:        The GeneXpert MRSA Assay (FDA approved for NASAL specimens only), is one component of a comprehensive MRSA colonization surveillance program. It is not intended to diagnose MRSA infection nor to guide or monitor treatment for MRSA infections.     Radiology Reports Dg Chest 1 View  Result Date: 03/04/2016 CLINICAL DATA:  SOB since this morning. EXAM: CHEST 1 VIEW COMPARISON:  1 day prior FINDINGS: Patient rotated to the left. Cardiomegaly accentuated by AP portable technique. Small left pleural effusion. No pneumothorax. Mild interstitial edema is new or increased. Improved bibasilar atelectasis. Inferior right upper lobe opacity is not significantly changed and measures on the order of 3.8 cm. IMPRESSION: Development of mild interstitial edema. Persistent left pleural effusion with decreased bibasilar atelectasis. Right upper lobe rounded density is similar to on the most recent exam. Not readily apparent on 02/07/2016, arguing against a neoplastic process. Possibly an area of loculated pleural fluid. Consider further evaluation with repeat PA and lateral radiographs. Electronically Signed   By: Abigail Miyamoto M.D.   On: 03/04/2016 08:46   Dg Chest 2 View  Result Date: 02/07/2016 CLINICAL DATA:  Recent fall with altered mental status EXAM: CHEST  2 VIEW COMPARISON:  12/06/2015 FINDINGS: Cardiac shadow is stable. Thickening of the minor and major fissures are again identified on the right and stable. No focal infiltrate or sizable effusion is noted. No acute bony abnormality is noted. IMPRESSION: Thickening of the major and minor fissures on the right. No acute abnormality is noted. Electronically Signed   By: Inez Catalina M.D.   On: 02/07/2016 21:51   Ct Head Wo Contrast  Result Date: 03/03/2016 CLINICAL DATA:  Altered mental status.  EXAM: CT HEAD WITHOUT CONTRAST TECHNIQUE: Contiguous axial images were obtained from the base of the skull through the vertex without intravenous contrast. COMPARISON:  CT 02/07/2016, MRI 02/08/2016 FINDINGS: Brain: No evidence of acute infarction, hemorrhage, hydrocephalus, extra-axial collection or mass lesion/mass effect. Stable degree of atrophy and chronic small vessel ischemia. Vascular: Atherosclerosis of skullbase vasculature without hyperdense vessel or abnormal calcification. Skull: Normal. Negative for fracture or focal lesion. Sinuses/Orbits: Post bilateral cataract resection. Mild chronic mucosal thickening in the paranasal sinuses. No fluid levels. Mastoid air cells are well aerated. Other: None. IMPRESSION: No acute intracranial abnormality. Electronically Signed   By: Jeb Levering M.D.   On: 03/03/2016 23:10   Ct Head Wo Contrast  Result Date: 02/07/2016 CLINICAL DATA:  Altered mental status EXAM: CT HEAD WITHOUT CONTRAST TECHNIQUE: Contiguous axial images were obtained from the base of the skull through the vertex without intravenous contrast. COMPARISON:  02/03/2016 FINDINGS: Brain: No evidence of acute infarction, hemorrhage, hydrocephalus, extra-axial collection or mass lesion/mass effect. Mild chronic white matter ischemic change and mild atrophy are seen. Vascular: No hyperdense vessel or unexpected calcification. Skull: Normal. Negative for fracture or focal lesion. Sinuses/Orbits: No acute finding. Other: None. IMPRESSION: No acute intracranial abnormality noted. Electronically Signed   By: Inez Catalina M.D.   On: 02/07/2016 19:40   Ct Chest Wo Contrast  Result Date: 03/05/2016 CLINICAL DATA:  Sepsis with abnormal finding in the right lung on recent chest x-ray EXAM: CT CHEST WITHOUT CONTRAST TECHNIQUE: Multidetector CT imaging of the chest was performed following the standard protocol without  IV contrast. COMPARISON:  Chest x-ray 03/04/2016. Chest x-ray 02/07/2016. Abdomen and  pelvis CT 08/13/2015. FINDINGS: Cardiovascular: The heart is enlarged. Coronary artery calcification is noted. Ascending thoracic aorta measures 4.1 cm maximum diameter. Atherosclerotic calcification is noted in the wall of the thoracic aorta. Mediastinum/Nodes: Upper normal to mildly enlarged lymph nodes are scattered through the mediastinum. 9 mm short axis precarinal lymph node is identified on image 37 series 201. 13 mm short axis subcarinal lymph nodes seen image 52. Soft tissue fullness noted in both hilar regions which may be related to atelectasis although the degree of lymphadenopathy cannot be excluded on this noncontrast exam. The esophagus has normal imaging features. Lungs/Pleura: Interlobular septal thickening is identified in the upper and lower lobes bilaterally, with a slight right-sided predominance. There is some probable asymmetric pleural-parenchymal scarring left apex with associated calcification. Comparing back to previous chest x-ray from 02/02/2016 and abdomen CT from 08/13/2015, the patient has developed new small bilateral pleural effusions and nodularity associated with the right lung fissures. 3.4 x 4.4 cm lobulated masslike lesion is identified in the right major fissure posteriorly. This is associated with nodular opacity in the minor fissure which is contiguous. The dominant masslike component approaches water attenuation. Associated bilateral lower lobe collapse/consolidation is evident. Upper Abdomen: Unremarkable. Musculoskeletal: Bone windows reveal no worrisome lytic or sclerotic osseous lesions. IMPRESSION: 1. Findings on recent chest x-ray represent areas of low-attenuation masslike and nodular opacity involving the fissures of the right lung given the relatively rapid interval appearance, this would be unusual for neoplasm and the attenuation of this disease is lower than would be expected for soft tissue. The Nodularity is atypical for simple pleural effusion. As such,  malignant involvement of the pleural surface with effusion would be a consideration. Infection/empyema with extubated effusion in the fissure might also present with these features. 2. Bibasilar collapse/consolidation. 3. Interlobular septal thickening, right greater than left. This may be related to pulmonary edema or lymphangitic disease including neoplasm. 4. Borderline mediastinal lymphadenopathy. 5. Coronary artery and thoracic aortic atherosclerosis. Ascending thoracic aorta measures up to 4.1 cm diameter. Recommend annual imaging followup by CTA or MRA. This recommendation follows 2010 ACCF/AHA/AATS/ACR/ASA/SCA/SCAI/SIR/STS/SVM Guidelines for the Diagnosis and Management of Patients with Thoracic Aortic Disease. Circulation. 2010; [id number] Electronically Signed   By: Misty Stanley M.D.   On: 03/05/2016 07:20   Mr Brain Wo Contrast  Result Date: 02/08/2016 CLINICAL DATA:  Fall.  Altered mental status. EXAM: MRI HEAD WITHOUT CONTRAST TECHNIQUE: Multiplanar, multiecho pulse sequences of the brain and surrounding structures were obtained without intravenous contrast. COMPARISON:  Head CT 02/07/2016 FINDINGS: Brain: No acute infarct or intraparenchymal hemorrhage. There is beginning confluent hyperintense T2-weighted signal within the periventricular white matter, mildly asymmetric T2 hyperintensity and volume loss of the left hippocampus. No mass lesion or midline shift. No hydrocephalus or extra-axial fluid collection. No age advanced atrophy. The midline structures are normal. Vascular: Major intracranial arterial and venous sinus flow voids are preserved. No evidence of chronic microhemorrhage or amyloid angiopathy. Skull and upper cervical spine: The visualized skull base, calvarium, upper cervical spine and extracranial soft tissues are normal. Sinuses/Orbits: No fluid levels or advanced mucosal thickening. No mastoid effusion. Normal orbits. IMPRESSION: 1. No acute intracranial abnormality. 2.  Mild atrophy and findings of chronic microvascular ischemia. Electronically Signed   By: Ulyses Jarred M.D.   On: 02/08/2016 05:21   Dg Chest Port 1 View  Result Date: 03/06/2016 CLINICAL DATA:  Respiratory failure. EXAM: PORTABLE CHEST 1 VIEW  COMPARISON:  03/04/2016 CT of the chest on 03/05/2016. FINDINGS: Stable lenticular opacity within the right major fissure. Improved aeration of both lower lung zones. Some residual atelectasis remains in the left lower lung. No edema or pneumothorax. The heart size is stable and within normal limits. Stable mild mediastinal prominence and ectasia of the thoracic aorta. IMPRESSION: Stable opacity within the right major fissure. Improved aeration of both lower lungs. Electronically Signed   By: Aletta Edouard M.D.   On: 03/06/2016 07:48   Dg Chest Port 1 View  Result Date: 03/03/2016 CLINICAL DATA:  Altered mental status. EXAM: PORTABLE CHEST 1 VIEW COMPARISON:  Chest radiograph 02/07/2016 FINDINGS: Cardiomediastinal silhouette is enlarged. Mediastinal contours appear intact. Calcific atherosclerotic disease of the aorta noted. There is no evidence of pneumothorax. Low lung volumes with bibasilar atelectasis. Probable bilateral pleural effusions, greater on the left. Rounded density projecting over the lateral mid right thorax may represent loculated pleural effusion versus pulmonary or pleural based mass. Osseous structures are without acute abnormality. Soft tissues are grossly normal. Breast implants are noted. IMPRESSION: Probable bilateral pleural effusions, greater on the left with bibasilar atelectasis. Rounded density projecting over the lateral mid right hemithorax may represent loculated pleural effusion versus pulmonary or pleural based mass. Cross-sectional imaging is recommended if further evaluation is clinically desired. Electronically Signed   By: Fidela Salisbury M.D.   On: 03/03/2016 22:23   Dg Knee Complete 4 Views Right  Result Date:  02/07/2016 CLINICAL DATA:  Status post fall, with right knee pain. Initial encounter. EXAM: RIGHT KNEE - COMPLETE 4+ VIEW COMPARISON:  None. FINDINGS: There is no evidence of fracture or dislocation. A bone island is noted at the proximal tibia. The joint spaces are preserved. No significant degenerative change is seen; the patellofemoral joint is grossly unremarkable in appearance. No significant joint effusion is seen. The visualized soft tissues are normal in appearance. IMPRESSION: No evidence of fracture or dislocation. Electronically Signed   By: Garald Balding M.D.   On: 02/07/2016 23:49   Dg Hips Bilat With Pelvis 3-4 Views  Result Date: 02/07/2016 CLINICAL DATA:  Bilateral hip pain following fall, initial encounter EXAM: DG HIP (WITH OR WITHOUT PELVIS) 3-4V BILAT COMPARISON:  None. FINDINGS: Pelvic ring is intact. No acute fracture or dislocation is noted. No soft tissue abnormality is noted. IMPRESSION: No acute abnormality noted. Electronically Signed   By: Inez Catalina M.D.   On: 02/07/2016 21:49     Yer Castello M.D on 03/06/2016 at 2:49 PM  Between 7am to 7pm - Pager - [phone]  After 7pm go to [email or web address] - password [facility]  [facility] -  Office  [phone]

## 2016-03-06 NOTE — Progress Notes (Signed)
[place] for Warfarin Indication: atrial fibrillation  Allergies  Allergen Reactions  . Codeine Anaphylaxis and Nausea And Vomiting    Note: pt has tramadol for years with no reaction  . Morphine And Related Nausea And Vomiting    Severe nausea per daughter  . Valium [Diazepam] Shortness Of Breath  . Sulfa Antibiotics Itching and Rash  . Darvon [Propoxyphene] Nausea And Vomiting  . Floxin [Ofloxacin] Itching  . Levsin [Hyoscyamine Sulfate] Other (See Comments)    Unknown reaction  . Penicillins Itching    Itching reported by [facility] Has patient had a PCN reaction causing immediate rash, facial/tongue/throat swelling, SOB or lightheadedness with hypotension: Yes Has patient had a PCN reaction causing severe rash involving mucus membranes or skin necrosis: No Has patient had a PCN reaction that required hospitalization No Has patient had a PCN reaction occurring within the last 10 years: No If all of the above answers are "NO", then may proceed with Cephalosporin use.   . Zantac [Ranitidine Hcl] Other (See Comments)    Unknown reaction    Patient Measurements: Height: 5\' 9"  (175.3 cm) (per grandson) Weight: 180 lb 8.9 oz (81.9 kg) IBW/kg (Calculated) : 66.2  Vital Signs: Temp: 98.7 F (37.1 C) (12/27 0700) Temp Source: Oral (12/27 0700) BP: 189/85 (12/27 0700) Pulse Rate: 80 (12/27 0700)  Labs:  Recent Labs  03/03/16 2142 03/03/16 2204 03/04/16 0211  03/04/16 0758 03/04/16 1357 03/04/16 1822 03/05/16 0401 03/06/16 0501  HGB 10.9*  --  9.2*  --   --  9.5*  --  10.0* 11.2*  HCT 34.6*  --  29.6*  --   --  29.7*  --  32.5* 35.1*  PLT 168  --  156  --   --   --   --  168 218  APTT 46*  --   --   --   --   --   --   --   --   LABPROT 26.3*  --  26.1*  --   --   --   --  26.5* 25.9*  INR 2.37  --  2.34  --   --   --   --  2.39 2.32  CREATININE 1.51*  --   --   --   --   --   --  1.91* 1.10*  CKTOTAL  --  31*   --   --   --   --   --   --   --   TROPONINI  --   --   --   < > 0.29* 0.17* 0.12*  --   --   < > = values in this interval not displayed.  Estimated Creatinine Clearance: 45.1 mL/min (by C-G formula based on SCr of 1.1 mg/dL (H)).   Assessment: 82 y/o F presents to the ED with altered mental status and suspected UTI. Patient takes warfarin PTA for afib and is to continue inpatient. Pharmacy to dose. INR is therapeutic at 2.37 on admission. -INR= 2.32 and at goal (azithromycin may increase INR d/t drug interaction with warfarin).  Warfarin PTA dosing is 2 mg on Mon/Fri and 4 mg all other days. No overt s/s bleeding noted.   Goal of Therapy:  INR 2-3 Monitor platelets by anticoagulation protocol: Yes   Plan Warfarin 2 mg Mon/Fri, 4 mg all other days Daily PT/INR (while on azithromycin)  Hildred Laser, Pharm D 03/06/2016 11:12 AM

## 2016-03-07 LAB — CBC
HCT: 31.1 % — ABNORMAL LOW (ref 36.0–46.0)
Hemoglobin: 9.9 g/dL — ABNORMAL LOW (ref 12.0–15.0)
MCH: 26.5 pg (ref 26.0–34.0)
MCHC: 31.8 g/dL (ref 30.0–36.0)
MCV: 83.4 fL (ref 78.0–100.0)
Platelets: 197 10*3/uL (ref 150–400)
RBC: 3.73 MIL/uL — ABNORMAL LOW (ref 3.87–5.11)
RDW: 20.1 % — ABNORMAL HIGH (ref 11.5–15.5)
WBC: 7.6 10*3/uL (ref 4.0–10.5)

## 2016-03-07 LAB — BASIC METABOLIC PANEL
Anion gap: 8 (ref 5–15)
Anion gap: 11 (ref 5–15)
BUN: 12 mg/dL (ref 6–20)
BUN: 12 mg/dL (ref 6–20)
CO2: 23 mmol/L (ref 22–32)
CO2: 21 mmol/L — ABNORMAL LOW (ref 22–32)
Calcium: 8.2 mg/dL — ABNORMAL LOW (ref 8.9–10.3)
Calcium: 8.5 mg/dL — ABNORMAL LOW (ref 8.9–10.3)
Chloride: 109 mmol/L (ref 101–111)
Chloride: 108 mmol/L (ref 101–111)
Creatinine, Ser: 1.29 mg/dL — ABNORMAL HIGH (ref 0.44–1.00)
Creatinine, Ser: 1.32 mg/dL — ABNORMAL HIGH (ref 0.44–1.00)
GFR calc Af Amer: 43 mL/min — ABNORMAL LOW (ref >60)
GFR calc Af Amer: 42 mL/min — ABNORMAL LOW (ref >60)
GFR calc non Af Amer: 37 mL/min — ABNORMAL LOW (ref >60)
GFR calc non Af Amer: 36 mL/min — ABNORMAL LOW (ref >60)
Glucose, Bld: 93 mg/dL (ref 65–99)
Glucose, Bld: 125 mg/dL — ABNORMAL HIGH (ref 65–99)
Potassium: 2.9 mmol/L — ABNORMAL LOW (ref 3.5–5.1)
Potassium: 3.4 mmol/L — ABNORMAL LOW (ref 3.5–5.1)
Sodium: 140 mmol/L (ref 135–145)
Sodium: 140 mmol/L (ref 135–145)

## 2016-03-07 LAB — PROTIME-INR
INR: 4.12
Prothrombin Time: 41.2 seconds — ABNORMAL HIGH (ref 11.4–15.2)

## 2016-03-07 MED ORDER — POTASSIUM CHLORIDE CRYS ER 20 MEQ PO TBCR
40.0000 meq | EXTENDED_RELEASE_TABLET | Freq: Every day | ORAL | Status: DC
  Administered 2016-03-07 – 2016-03-09 (×3): 40 meq via ORAL
  Filled 2016-03-07 (×3): qty 2

## 2016-03-07 MED ORDER — TUBERCULIN PPD 5 UNIT/0.1ML ID SOLN
5.0000 [IU] | Freq: Once | INTRADERMAL | Status: DC
  Filled 2016-03-07: qty 0.1

## 2016-03-07 MED ORDER — POTASSIUM CHLORIDE CRYS ER 20 MEQ PO TBCR
40.0000 meq | EXTENDED_RELEASE_TABLET | Freq: Four times a day (QID) | ORAL | Status: AC
  Administered 2016-03-07: 40 meq via ORAL
  Filled 2016-03-07 (×2): qty 2

## 2016-03-07 NOTE — Care Management Note (Signed)
Case Management Note  Patient Details  Name: Tracy Rodriguez MRN: [id number] Date of Birth: Aug 10, 1931  Subjective/Objective:   Patient is from ALF, will need SNF, CSW following.                 Action/Plan:   Expected Discharge Date:                  Expected Discharge Plan:  Assisted Living / Rest Home  In-House Referral:  Clinical Social Work  Discharge planning Services  CM Consult  Post Acute Care Choice:    Choice offered to:     DME Arranged:    DME Agency:     HH Arranged:    [place] Agency:     Status of Service:  Completed, signed off  If discussed at [employer], dates discussed:    Additional Comments:  Zenon Mayo, RN 03/07/2016, 5:18 PM

## 2016-03-07 NOTE — Progress Notes (Signed)
Critical INR of 4.12 called to pharmacy and paged to Dr Landis Gandy awaiting call back

## 2016-03-07 NOTE — Clinical Social Work Note (Signed)
Clinical Social Work Assessment  Patient Details  Name: Tracy Rodriguez MRN: [id number] Date of Birth: Oct 25, 1931  Date of referral:  03/07/16               Reason for consult:  Facility Placement                Permission sought to share information with:  Facility Sport and exercise psychologist, Family Supports Permission granted to share information::     Name::     Tracy Rodriguez  Agency::  SNFs  Relationship::  Daughter  Contact Information:  [phone]  Housing/Transportation Living arrangements for the past 2 months:  [place], Halls of Information:  Adult Children Patient Interpreter Needed:  None Criminal Activity/Legal Involvement Pertinent to Current Situation/Hospitalization:  No - Comment as needed Significant Relationships:  Adult Children Lives with:  Self Do you feel safe going back to the place where you live?  No Need for family participation in patient care:  Yes (Comment)  Care giving concerns:  CSW received consult for possible SNF placement at time of discharge. CSW spoke with patient's daughter, Tracy Rodriguez, regarding PT recommendation of SNF placement at time of discharge. Per patient's daughter, they have arranged for patient to move to [facility], but pt needs rehab before she goes there. Patient and patient's daughter expressed understanding of PT recommendation and are agreeable to SNF placement at time of discharge. CSW to continue to follow and assist with discharge planning needs.   Social Worker assessment / plan:  CSW spoke with patient and patient's daughter concerning possibility of rehab at [facility] before returning home.  Employment status:  Retired Forensic scientist:  Medicare PT Recommendations:  [place] / Referral to community resources:  [place]  Patient/Family's Response to care:  Patient and patient's daughter recognize need for rehab before going to ALF and are agreeable to a SNF in  [place]. Patient reported preference for [facility].  Patient/Family's Understanding of and Emotional Response to Diagnosis, Current Treatment, and Prognosis:  Patient/family is realistic regarding therapy needs and expressed being hopeful for SNF placement. Patient's daughter expressed understanding of CSW role and discharge process. No questions/concerns about plan or treatment.    Emotional Assessment Appearance:  Appears stated age Attitude/Demeanor/Rapport:  Other (Appropriate) Affect (typically observed):  Appropriate Orientation:  Oriented to Self, Oriented to Situation, Oriented to Place, Oriented to  Time Alcohol / Substance use:  Not Applicable Psych involvement (Current and /or in the community):  No (Comment)  Discharge Needs  Concerns to be addressed:  Care Coordination Readmission within the last 30 days:  Yes Current discharge risk:  None Barriers to Discharge:  Continued Medical Work up   [employer], [place] 03/07/2016, 4:45 PM

## 2016-03-07 NOTE — Progress Notes (Signed)
PROGRESS NOTE                                                                                                                                                                                                             Patient Demographics:    Tracy Rodriguez, is a 82 y.o. female, DOB - January 18, 1932, [id number]  Admit date - 03/03/2016   Admitting Physician Norval Morton, MD  Outpatient Primary MD for the patient is [name], MD  LOS - 3  Chief Complaint  Patient presents with  . Altered Mental Status       Brief Narrative   82 y.o. female with medical history significant of HTN, HLD, PAF, breast cancer s/p right mastectomy, CKD stage III, and bipolar disorder; who presents with acute altered mental status, Workup significant for sepsis secondary to UTI and pneumonia.   Subjective:    [place] today has, No headache, No chest pain, No abdominal pain - No Nausea, Report generalized weakness, reported cough, denies any urinary symptoms.  Assessment  & Plan :    Principal Problem:   Sepsis ([place]) Active Problems:   Bipolar disorder ([place])   Paroxysmal atrial fibrillation ([place])   Acute kidney injury ([place])   Abnormal chest x-ray   Acute renal failure superimposed on stage 3 chronic kidney disease (HCC)   CHF (congestive heart failure) (HCC)   Anemia   Sepsis  - suspected UTI and CAP , she presents with a rectal temperature of 105.6 , respirations up to 24, WBC 11.2, and lactic acid 1.32. With altered mental status . - Workup significant for positive urinalysis, she reports cough, and possible new left upper lung pneumonia . - Sepsis protocol initiated , hypotensive requiring multiple fluid boluses,Hypotensive resolved.  UTI - Urine culture growing coli pansensitive, initially on cefepime, will transitioned to rocephin .   CAP - Chest x-ray with right upper lung opacity, CT chest significant for masslike right lung  nodularity, volume overload versus neoplasm versus infection, and interlobular septal thickening , pulmonary consulted for further evaluation regarding these findings ., The plan is to continue with diuresis, and anterior to treat possible infection, and will need repeat CT after appropriate diuresis, follow-up appointment as an outpatient has been arranged by PCCM on 04/17/2015, this was discussed with both daughters. - Initially on broad-spectrum antibiotics when she  septic, IV vancomycin, cefepime, and azithromycin , currently narrowed to IV Rocephin and azithromycin . - Patient with significant cough and secretion, encourage pulmonary toilet, incentive spirometry, flutter valve, and Mucinex   Elevated troponins - She denies any chest pain, non-ACS pattern, most likely related to sepsis and hypotension - 2-D echo with no wall motion abnormalities  Acute kidney injury on chronic kidney disease stage III:  - Creatinine baseline around 1.09 , elevated creatinine on admission , at 1.9 , this is most likely due to ATN from hypotension and sepsis - Continue to monitor as on diuresis  chronic diastolic CHF  - Patient notes lower extremity swelling and new cough. CXR abnormal with b/l pleural effusions. Last echocardiogram showed EF of 65-75% on 02/09/2016, repeat echo with stable EF, no wall motion abnormality, and grade 1 diastolic dysfunction - continuous pulse oximetry with nasal cannula oxygen to keep O2 sats patient greater 92%. - Continue with IV diuresis  Paroxysmal atrial fibrillation on anticoagulation: - Continue  digoxin, Cardizem and metoprolol and warfarin per pharmacy  Bipolar disorder - Resume home medication  Delirium - Patient with sundowning, would resume her home Seroquel.  Essential hypertension - Initially hypotensive requiring fluid boluses,  - Blood pressure acceptable, continue with home medication, as well on when necessary hydralazine  Hyperlipidemia - Continue  atorvastatin  Anemia:  - continue to monitor    Code Status : DO NOT RESUSCITATE  Family Communication  : Daughter's at bedside  Disposition Plan  : PT consulted, as per family arrangement for ALF is been made, will need SNF prior to it ALF placement D/W social worker  Consults  :  None  Procedures  : none  DVT Prophylaxis  : On warfarin  Lab Results  Component Value Date   PLT 197 03/07/2016    Antibiotics  :    Anti-infectives    Start     Dose/Rate Route Frequency Ordered Stop   03/07/16 1000  azithromycin (ZITHROMAX) tablet 500 mg     500 mg Oral Daily 03/06/16 1112     03/06/16 1300  cefTRIAXone (ROCEPHIN) 2 g in dextrose 5 % 50 mL IVPB     2 g 100 mL/hr over 30 Minutes Intravenous Every 24 hours 03/06/16 1112     03/06/16 0500  ceFEPIme (MAXIPIME) 1 g in dextrose 5 % 50 mL IVPB  Status:  Discontinued     1 g 100 mL/hr over 30 Minutes Intravenous Every 24 hours 03/05/16 1139 03/06/16 1112   03/05/16 0500  azithromycin (ZITHROMAX) 500 mg in dextrose 5 % 250 mL IVPB  Status:  Discontinued     500 mg 250 mL/hr over 60 Minutes Intravenous Every 24 hours 03/04/16 1002 03/06/16 1112   03/05/16 0500  vancomycin (VANCOCIN) IVPB 750 mg/150 ml premix  Status:  Discontinued     750 mg 150 mL/hr over 60 Minutes Intravenous Every 24 hours 03/04/16 1013 03/06/16 1112   03/05/16 0500  ceFEPIme (MAXIPIME) 2 g in dextrose 5 % 50 mL IVPB  Status:  Discontinued     2 g 100 mL/hr over 30 Minutes Intravenous Every 24 hours 03/04/16 1014 03/05/16 1139   03/04/16 0300  vancomycin (VANCOCIN) IVPB 1000 mg/200 mL premix     1,000 mg 200 mL/hr over 60 Minutes Intravenous STAT 03/04/16 0203 03/04/16 0527   03/04/16 0230  azithromycin (ZITHROMAX) 500 mg in dextrose 5 % 250 mL IVPB     500 mg 250 mL/hr over 60 Minutes Intravenous STAT 03/04/16  [id number] 03/04/16 0528   03/04/16 0115  ceFEPIme (MAXIPIME) 2 g in dextrose 5 % 50 mL IVPB  Status:  Discontinued     2 g 100 mL/hr over 30 Minutes  Intravenous Every 24 hours 03/04/16 0041 03/04/16 1014   03/03/16 2300  cefTRIAXone (ROCEPHIN) 1 g in dextrose 5 % 50 mL IVPB     1 g 100 mL/hr over 30 Minutes Intravenous  Once 03/03/16 2252 03/04/16 0107        Objective:   Vitals:   03/06/16 2317 03/07/16 0347 03/07/16 0700 03/07/16 1052  BP: (!) 151/69 (!) 154/69 (!) 174/77 129/63  Pulse: 72 85 82 67  Resp: 16 16 15  (!) 23  Temp: 98.2 F (36.8 C) 99.5 F (37.5 C) 99.5 F (37.5 C) 99.4 F (37.4 C)  TempSrc: Oral Oral Oral Oral  SpO2: 96% 93% 94% 96%  Weight:  79.5 kg (175 lb 4.3 oz)    Height:        Wt Readings from Last 3 Encounters:  03/07/16 79.5 kg (175 lb 4.3 oz)  02/11/16 78.2 kg (172 lb 4.8 oz)  01/12/16 77.2 kg (170 lb 3.2 oz)     Intake/Output Summary (Last 24 hours) at 03/07/16 1304 Last data filed at 03/07/16 1100  Gross per 24 hour  Intake             2050 ml  Output             1150 ml  Net              900 ml     Physical Exam  Awake Alert 2 today, More appropriate [place].AT,PERRAL Supple Neck,No JVD Symmetrical Chest wall movement, Good air movement bilaterally, CTAB RRR,No Gallops,Rubs or new Murmurs, No Parasternal Heave +ve B.Sounds, Abd Soft, No tenderness,  No rebound - guarding or rigidity. No Cyanosis, Clubbing or edema, No new Rash or bruise      Data Review:    CBC  Recent Labs Lab 03/03/16 2142 03/04/16 0211 03/04/16 1357 03/05/16 0401 03/06/16 0501 03/07/16 0456  WBC 11.2* 14.9*  --  10.9* 10.8* 7.6  HGB 10.9* 9.2* 9.5* 10.0* 11.2* 9.9*  HCT 34.6* 29.6* 29.7* 32.5* 35.1* 31.1*  PLT 168 156  --  168 218 197  MCV 85.6 84.6  --  86.9 82.6 83.4  MCH 27.0 26.3  --  26.7 26.4 26.5  MCHC 31.5 31.1  --  30.8 31.9 31.8  RDW 20.1* 19.9*  --  20.8* 20.0* 20.1*  LYMPHSABS 0.4*  --   --   --   --   --   MONOABS 0.2  --   --   --   --   --   EOSABS 0.0  --   --   --   --   --   BASOSABS 0.0  --   --   --   --   --     Chemistries   Recent Labs Lab 03/03/16 2142  03/05/16 0401 03/06/16 0501 03/07/16 0456  NA 137 140 141 140  K 3.9 4.0 3.7 2.9*  CL 106 111 111 109  CO2 24 20* 20* 23  GLUCOSE 143* 102* 140* 93  BUN 19 25* 15 12  CREATININE 1.51* 1.91* 1.10* 1.29*  CALCIUM 8.1* 8.2* 8.6* 8.2*  AST 28  --   --   --   ALT 21  --   --   --   ALKPHOS 64  --   --   --  BILITOT 0.7  --   --   --    ------------------------------------------------------------------------------------------------------------------ No results for input(s): CHOL, HDL, LDLCALC, TRIG, CHOLHDL, LDLDIRECT in the last 72 hours.  Lab Results  Component Value Date   HGBA1C 5.2 02/08/2016   ------------------------------------------------------------------------------------------------------------------ No results for input(s): TSH, T4TOTAL, T3FREE, THYROIDAB in the last 72 hours.  Invalid input(s): FREET3 ------------------------------------------------------------------------------------------------------------------ No results for input(s): VITAMINB12, FOLATE, FERRITIN, TIBC, IRON, RETICCTPCT in the last 72 hours.  Coagulation profile  Recent Labs Lab 03/03/16 2142 03/04/16 0211 03/05/16 0401 03/06/16 0501 03/07/16 0456  INR 2.37 2.34 2.39 2.32 4.12*    No results for input(s): DDIMER in the last 72 hours.  Cardiac Enzymes  Recent Labs Lab 03/04/16 0758 03/04/16 1357 03/04/16 1822  TROPONINI 0.29* 0.17* 0.12*   ------------------------------------------------------------------------------------------------------------------    Component Value Date/Time   BNP 420.9 (H) 03/03/2016 2142    Inpatient Medications  Scheduled Meds: . atorvastatin  20 mg Oral Daily  . azithromycin  500 mg Oral Daily  . carvedilol  6.25 mg Oral BID WC  . cefTRIAXone (ROCEPHIN)  IV  2 g Intravenous Q24H  . diltiazem  120 mg Oral Daily  . escitalopram  10 mg Oral Daily  . ferrous sulfate  325 mg Oral Q breakfast  . furosemide  40 mg Intravenous BID  . guaiFENesin   1,200 mg Oral BID  . hydrALAZINE  25 mg Oral Q6H  . mouth rinse  15 mL Mouth Rinse BID  . potassium chloride  40 mEq Oral Q6H  . potassium chloride SA  40 mEq Oral Daily  . QUEtiapine  25 mg Oral BID  . traZODone  150 mg Oral QHS  . tuberculin  5 Units Intradermal Once  . Warfarin - Pharmacist Dosing Inpatient   Does not apply q1800   Continuous Infusions:  PRN Meds:.acetaminophen **OR** acetaminophen, albuterol, bisacodyl, hydrALAZINE, ondansetron **OR** ondansetron (ZOFRAN) IV, sodium phosphate  Micro Results Recent Results (from the past 240 hour(s))  Blood Culture (routine x 2)     Status: None (Preliminary result)   Collection Time: 03/03/16  9:35 PM  Result Value Ref Range Status   Specimen Description BLOOD LEFT ANTECUBITAL  Final   Special Requests IN PEDIATRIC BOTTLE 4CC  Final   Culture NO GROWTH 4 DAYS  Final   Report Status PENDING  Incomplete  Blood Culture (routine x 2)     Status: None (Preliminary result)   Collection Time: 03/03/16  9:43 PM  Result Value Ref Range Status   Specimen Description BLOOD RIGHT ARM  Final   Special Requests BOTTLES DRAWN AEROBIC AND ANAEROBIC 10CC EA  Final   Culture NO GROWTH 4 DAYS  Final   Report Status PENDING  Incomplete  Urine culture     Status: Abnormal   Collection Time: 03/03/16 10:00 PM  Result Value Ref Range Status   Specimen Description URINE, CATHETERIZED  Final   Special Requests NONE  Final   Culture >=100,000 COLONIES/mL ESCHERICHIA COLI (A)  Final   Report Status 03/06/2016 FINAL  Final   Organism ID, Bacteria ESCHERICHIA COLI (A)  Final      Susceptibility   Escherichia coli - MIC*    AMPICILLIN <=2 SENSITIVE Sensitive     CEFAZOLIN <=4 SENSITIVE Sensitive     CEFTRIAXONE <=1 SENSITIVE Sensitive     CIPROFLOXACIN <=0.25 SENSITIVE Sensitive     GENTAMICIN <=1 SENSITIVE Sensitive     IMIPENEM <=0.25 SENSITIVE Sensitive     NITROFURANTOIN <=16 SENSITIVE Sensitive  TRIMETH/SULFA <=20 SENSITIVE Sensitive      AMPICILLIN/SULBACTAM <=2 SENSITIVE Sensitive     PIP/TAZO <=4 SENSITIVE Sensitive     Extended ESBL NEGATIVE Sensitive     * >=100,000 COLONIES/mL ESCHERICHIA COLI  MRSA PCR Screening     Status: None   Collection Time: 03/04/16  4:01 AM  Result Value Ref Range Status   MRSA by PCR NEGATIVE NEGATIVE Final    Comment:        The GeneXpert MRSA Assay (FDA approved for NASAL specimens only), is one component of a comprehensive MRSA colonization surveillance program. It is not intended to diagnose MRSA infection nor to guide or monitor treatment for MRSA infections.     Radiology Reports Dg Chest 1 View  Result Date: 03/04/2016 CLINICAL DATA:  SOB since this morning. EXAM: CHEST 1 VIEW COMPARISON:  1 day prior FINDINGS: Patient rotated to the left. Cardiomegaly accentuated by AP portable technique. Small left pleural effusion. No pneumothorax. Mild interstitial edema is new or increased. Improved bibasilar atelectasis. Inferior right upper lobe opacity is not significantly changed and measures on the order of 3.8 cm. IMPRESSION: Development of mild interstitial edema. Persistent left pleural effusion with decreased bibasilar atelectasis. Right upper lobe rounded density is similar to on the most recent exam. Not readily apparent on 02/07/2016, arguing against a neoplastic process. Possibly an area of loculated pleural fluid. Consider further evaluation with repeat PA and lateral radiographs. Electronically Signed   By: Abigail Miyamoto M.D.   On: 03/04/2016 08:46   Dg Chest 2 View  Result Date: 02/07/2016 CLINICAL DATA:  Recent fall with altered mental status EXAM: CHEST  2 VIEW COMPARISON:  12/06/2015 FINDINGS: Cardiac shadow is stable. Thickening of the minor and major fissures are again identified on the right and stable. No focal infiltrate or sizable effusion is noted. No acute bony abnormality is noted. IMPRESSION: Thickening of the major and minor fissures on the right. No acute  abnormality is noted. Electronically Signed   By: Inez Catalina M.D.   On: 02/07/2016 21:51   Ct Head Wo Contrast  Result Date: 03/03/2016 CLINICAL DATA:  Altered mental status. EXAM: CT HEAD WITHOUT CONTRAST TECHNIQUE: Contiguous axial images were obtained from the base of the skull through the vertex without intravenous contrast. COMPARISON:  CT 02/07/2016, MRI 02/08/2016 FINDINGS: Brain: No evidence of acute infarction, hemorrhage, hydrocephalus, extra-axial collection or mass lesion/mass effect. Stable degree of atrophy and chronic small vessel ischemia. Vascular: Atherosclerosis of skullbase vasculature without hyperdense vessel or abnormal calcification. Skull: Normal. Negative for fracture or focal lesion. Sinuses/Orbits: Post bilateral cataract resection. Mild chronic mucosal thickening in the paranasal sinuses. No fluid levels. Mastoid air cells are well aerated. Other: None. IMPRESSION: No acute intracranial abnormality. Electronically Signed   By: Jeb Levering M.D.   On: 03/03/2016 23:10   Ct Head Wo Contrast  Result Date: 02/07/2016 CLINICAL DATA:  Altered mental status EXAM: CT HEAD WITHOUT CONTRAST TECHNIQUE: Contiguous axial images were obtained from the base of the skull through the vertex without intravenous contrast. COMPARISON:  02/03/2016 FINDINGS: Brain: No evidence of acute infarction, hemorrhage, hydrocephalus, extra-axial collection or mass lesion/mass effect. Mild chronic white matter ischemic change and mild atrophy are seen. Vascular: No hyperdense vessel or unexpected calcification. Skull: Normal. Negative for fracture or focal lesion. Sinuses/Orbits: No acute finding. Other: None. IMPRESSION: No acute intracranial abnormality noted. Electronically Signed   By: Inez Catalina M.D.   On: 02/07/2016 19:40   Ct Chest Wo Contrast  Result  Date: 03/05/2016 CLINICAL DATA:  Sepsis with abnormal finding in the right lung on recent chest x-ray EXAM: CT CHEST WITHOUT CONTRAST  TECHNIQUE: Multidetector CT imaging of the chest was performed following the standard protocol without IV contrast. COMPARISON:  Chest x-ray 03/04/2016. Chest x-ray 02/07/2016. Abdomen and pelvis CT 08/13/2015. FINDINGS: Cardiovascular: The heart is enlarged. Coronary artery calcification is noted. Ascending thoracic aorta measures 4.1 cm maximum diameter. Atherosclerotic calcification is noted in the wall of the thoracic aorta. Mediastinum/Nodes: Upper normal to mildly enlarged lymph nodes are scattered through the mediastinum. 9 mm short axis precarinal lymph node is identified on image 37 series 201. 13 mm short axis subcarinal lymph nodes seen image 52. Soft tissue fullness noted in both hilar regions which may be related to atelectasis although the degree of lymphadenopathy cannot be excluded on this noncontrast exam. The esophagus has normal imaging features. Lungs/Pleura: Interlobular septal thickening is identified in the upper and lower lobes bilaterally, with a slight right-sided predominance. There is some probable asymmetric pleural-parenchymal scarring left apex with associated calcification. Comparing back to previous chest x-ray from 02/02/2016 and abdomen CT from 08/13/2015, the patient has developed new small bilateral pleural effusions and nodularity associated with the right lung fissures. 3.4 x 4.4 cm lobulated masslike lesion is identified in the right major fissure posteriorly. This is associated with nodular opacity in the minor fissure which is contiguous. The dominant masslike component approaches water attenuation. Associated bilateral lower lobe collapse/consolidation is evident. Upper Abdomen: Unremarkable. Musculoskeletal: Bone windows reveal no worrisome lytic or sclerotic osseous lesions. IMPRESSION: 1. Findings on recent chest x-ray represent areas of low-attenuation masslike and nodular opacity involving the fissures of the right lung given the relatively rapid interval appearance,  this would be unusual for neoplasm and the attenuation of this disease is lower than would be expected for soft tissue. The Nodularity is atypical for simple pleural effusion. As such, malignant involvement of the pleural surface with effusion would be a consideration. Infection/empyema with extubated effusion in the fissure might also present with these features. 2. Bibasilar collapse/consolidation. 3. Interlobular septal thickening, right greater than left. This may be related to pulmonary edema or lymphangitic disease including neoplasm. 4. Borderline mediastinal lymphadenopathy. 5. Coronary artery and thoracic aortic atherosclerosis. Ascending thoracic aorta measures up to 4.1 cm diameter. Recommend annual imaging followup by CTA or MRA. This recommendation follows 2010 ACCF/AHA/AATS/ACR/ASA/SCA/SCAI/SIR/STS/SVM Guidelines for the Diagnosis and Management of Patients with Thoracic Aortic Disease. Circulation. 2010; [id number] Electronically Signed   By: Misty Stanley M.D.   On: 03/05/2016 07:20   Mr Brain Wo Contrast  Result Date: 02/08/2016 CLINICAL DATA:  Fall.  Altered mental status. EXAM: MRI HEAD WITHOUT CONTRAST TECHNIQUE: Multiplanar, multiecho pulse sequences of the brain and surrounding structures were obtained without intravenous contrast. COMPARISON:  Head CT 02/07/2016 FINDINGS: Brain: No acute infarct or intraparenchymal hemorrhage. There is beginning confluent hyperintense T2-weighted signal within the periventricular white matter, mildly asymmetric T2 hyperintensity and volume loss of the left hippocampus. No mass lesion or midline shift. No hydrocephalus or extra-axial fluid collection. No age advanced atrophy. The midline structures are normal. Vascular: Major intracranial arterial and venous sinus flow voids are preserved. No evidence of chronic microhemorrhage or amyloid angiopathy. Skull and upper cervical spine: The visualized skull base, calvarium, upper cervical spine and  extracranial soft tissues are normal. Sinuses/Orbits: No fluid levels or advanced mucosal thickening. No mastoid effusion. Normal orbits. IMPRESSION: 1. No acute intracranial abnormality. 2. Mild atrophy and findings of chronic microvascular  ischemia. Electronically Signed   By: Ulyses Jarred M.D.   On: 02/08/2016 05:21   Dg Chest Port 1 View  Result Date: 03/06/2016 CLINICAL DATA:  Respiratory failure. EXAM: PORTABLE CHEST 1 VIEW COMPARISON:  03/04/2016 CT of the chest on 03/05/2016. FINDINGS: Stable lenticular opacity within the right major fissure. Improved aeration of both lower lung zones. Some residual atelectasis remains in the left lower lung. No edema or pneumothorax. The heart size is stable and within normal limits. Stable mild mediastinal prominence and ectasia of the thoracic aorta. IMPRESSION: Stable opacity within the right major fissure. Improved aeration of both lower lungs. Electronically Signed   By: Aletta Edouard M.D.   On: 03/06/2016 07:48   Dg Chest Port 1 View  Result Date: 03/03/2016 CLINICAL DATA:  Altered mental status. EXAM: PORTABLE CHEST 1 VIEW COMPARISON:  Chest radiograph 02/07/2016 FINDINGS: Cardiomediastinal silhouette is enlarged. Mediastinal contours appear intact. Calcific atherosclerotic disease of the aorta noted. There is no evidence of pneumothorax. Low lung volumes with bibasilar atelectasis. Probable bilateral pleural effusions, greater on the left. Rounded density projecting over the lateral mid right thorax may represent loculated pleural effusion versus pulmonary or pleural based mass. Osseous structures are without acute abnormality. Soft tissues are grossly normal. Breast implants are noted. IMPRESSION: Probable bilateral pleural effusions, greater on the left with bibasilar atelectasis. Rounded density projecting over the lateral mid right hemithorax may represent loculated pleural effusion versus pulmonary or pleural based mass. Cross-sectional imaging is  recommended if further evaluation is clinically desired. Electronically Signed   By: Fidela Salisbury M.D.   On: 03/03/2016 22:23   Dg Knee Complete 4 Views Right  Result Date: 02/07/2016 CLINICAL DATA:  Status post fall, with right knee pain. Initial encounter. EXAM: RIGHT KNEE - COMPLETE 4+ VIEW COMPARISON:  None. FINDINGS: There is no evidence of fracture or dislocation. A bone island is noted at the proximal tibia. The joint spaces are preserved. No significant degenerative change is seen; the patellofemoral joint is grossly unremarkable in appearance. No significant joint effusion is seen. The visualized soft tissues are normal in appearance. IMPRESSION: No evidence of fracture or dislocation. Electronically Signed   By: Garald Balding M.D.   On: 02/07/2016 23:49   Dg Hips Bilat With Pelvis 3-4 Views  Result Date: 02/07/2016 CLINICAL DATA:  Bilateral hip pain following fall, initial encounter EXAM: DG HIP (WITH OR WITHOUT PELVIS) 3-4V BILAT COMPARISON:  None. FINDINGS: Pelvic ring is intact. No acute fracture or dislocation is noted. No soft tissue abnormality is noted. IMPRESSION: No acute abnormality noted. Electronically Signed   By: Inez Catalina M.D.   On: 02/07/2016 21:49     Rosco Harriott M.D on 03/07/2016 at 1:04 PM  Between 7am to 7pm - Pager - [phone]  After 7pm go to [email or web address] - password [facility]  [facility] -  Office  [phone]

## 2016-03-07 NOTE — NC FL2 (Signed)
[place] MEDICAID FL2 LEVEL OF CARE SCREENING TOOL     IDENTIFICATION  Patient Name: Tracy Rodriguez Birthdate: 1931-08-28 Sex: female Admission Date (Current Location): 03/03/2016  [facility] and [place] Number:  Herbalist and Address:  [facility], [place] [address], [place], [place] [address]      Provider Number: [id number]  Attending Physician Name and Address:  Albertine Patricia, MD  Relative Name and Phone Number:  Warren Lacy, daughter, [phone]    Current Level of Care: Hospital Recommended Level of Care: [place] Prior Approval Number:    Date Approved/Denied:   PASRR Number: [id number]  Discharge Plan: SNF    Current Diagnoses: Patient Active Problem List   Diagnosis Date Noted  . CHF (congestive heart failure) ([place]) 03/04/2016  . Sepsis ([place]) 03/04/2016  . Anemia 03/04/2016  . Acute renal failure superimposed on stage 3 chronic kidney disease ([place]) 02/08/2016  . Acute encephalopathy 02/07/2016  . Elevated troponin 02/07/2016  . Tremor of right hand 02/07/2016  . Delirium   . Fall   . Right knee injury   . Syncope 12/05/2015  . Physical deconditioning 12/05/2015  . Acute kidney injury ([place]) 12/05/2015  . Dehydration, moderate 12/05/2015  . Acute hypokalemia 12/05/2015  . Abnormal chest x-ray 12/05/2015  . Encounter for therapeutic drug monitoring 12/22/2014  . Atrial fibrillation with RVR ([place]) 08/04/2014  . Paroxysmal atrial fibrillation ([place]) 08/04/2014  . Hypertensive urgency   . Obesity 09/27/2013  . Chronic back pain 09/14/2012  . Bipolar disorder ([place]) 09/14/2012    Orientation RESPIRATION BLADDER Height & Weight     Self, Time, Situation, Place  Normal Continent, Indwelling catheter Weight: 79.5 kg (175 lb 4.3 oz) Height:  5\' 9"  (175.3 cm) (per grandson)  BEHAVIORAL SYMPTOMS/MOOD NEUROLOGICAL BOWEL NUTRITION STATUS      Continent Diet (Please see DC summary)  AMBULATORY STATUS COMMUNICATION  OF NEEDS Skin   Limited Assist Verbally Normal                       Personal Care Assistance Level of Assistance  Bathing, Dressing, Feeding Bathing Assistance: Limited assistance Feeding assistance: Independent Dressing Assistance: Limited assistance     Functional Limitations Info             SPECIAL CARE FACTORS FREQUENCY  PT (By licensed PT)     PT Frequency: 5x/week              Contractures Contractures Info: Not present    Additional Factors Info  Code Status, Allergies, Psychotropic Code Status Info: DNR Allergies Info: Codeine, Morphine And Related, Valium Diazepam, Sulfa Antibiotics, Darvon Propoxyphene, Floxin Ofloxacin, Levsin Hyoscyamine Sulfate, Penicillins, Zantac Ranitidine Hcl Psychotropic Info: Seroquel         Current Medications (03/07/2016):  This is the current hospital active medication list Current Facility-Administered Medications  Medication Dose Route Frequency Provider Last Rate Last Dose  . acetaminophen (TYLENOL) tablet 650 mg  650 mg Oral Q6H PRN Norval Morton, MD   650 mg at 03/06/16 1541   Or  . acetaminophen (TYLENOL) suppository 650 mg  650 mg Rectal Q6H PRN Norval Morton, MD      . albuterol (PROVENTIL) (2.5 MG/3ML) 0.083% nebulizer solution 2.5 mg  2.5 mg Nebulization Q2H PRN Norval Morton, MD      . atorvastatin (LIPITOR) tablet 20 mg  20 mg Oral Daily Norval Morton, MD   20 mg at 03/07/16 0849  .  azithromycin (ZITHROMAX) tablet 500 mg  500 mg Oral Daily Albertine Patricia, MD   500 mg at 03/07/16 1200  . bisacodyl (DULCOLAX) suppository 10 mg  10 mg Rectal Daily PRN Albertine Patricia, MD   10 mg at 03/06/16 1248  . carvedilol (COREG) tablet 6.25 mg  6.25 mg Oral BID WC Albertine Patricia, MD   6.25 mg at 03/07/16 0850  . cefTRIAXone (ROCEPHIN) 2 g in dextrose 5 % 50 mL IVPB  2 g Intravenous Q24H Albertine Patricia, MD   2 g at 03/07/16 1300  . diltiazem (CARDIZEM CD) 24 hr capsule 120 mg  120 mg Oral Daily Albertine Patricia, MD   120 mg at 03/07/16 0849  . escitalopram (LEXAPRO) tablet 10 mg  10 mg Oral Daily Albertine Patricia, MD   10 mg at 03/07/16 1200  . ferrous sulfate tablet 325 mg  325 mg Oral Q breakfast Norval Morton, MD   325 mg at 03/07/16 0850  . furosemide (LASIX) injection 40 mg  40 mg Intravenous BID Albertine Patricia, MD   40 mg at 03/07/16 0850  . guaiFENesin (MUCINEX) 12 hr tablet 1,200 mg  1,200 mg Oral BID Albertine Patricia, MD   1,200 mg at 03/07/16 0850  . hydrALAZINE (APRESOLINE) injection 10 mg  10 mg Intravenous Q4H PRN Albertine Patricia, MD   10 mg at 03/06/16 1130  . hydrALAZINE (APRESOLINE) tablet 25 mg  25 mg Oral Q6H Albertine Patricia, MD   25 mg at 03/07/16 1200  . MEDLINE mouth rinse  15 mL Mouth Rinse BID Rondell A Tamala Julian, MD   15 mL at 03/07/16 1000  . ondansetron (ZOFRAN) tablet 4 mg  4 mg Oral Q6H PRN Norval Morton, MD       Or  . ondansetron (ZOFRAN) injection 4 mg  4 mg Intravenous Q6H PRN Rondell A Smith, MD      . potassium chloride SA (K-DUR,KLOR-CON) CR tablet 40 mEq  40 mEq Oral Daily Albertine Patricia, MD   40 mEq at 03/07/16 0849  . QUEtiapine (SEROQUEL) tablet 25 mg  25 mg Oral BID Albertine Patricia, MD   25 mg at 03/07/16 0850  . sodium phosphate (FLEET) 7-19 GM/118ML enema 1 enema  1 enema Rectal Daily PRN Albertine Patricia, MD   1 enema at 03/06/16 1539  . traZODone (DESYREL) tablet 150 mg  150 mg Oral QHS Rhetta Mura Schorr, NP   150 mg at 03/06/16 2104  . tuberculin injection 5 Units  5 Units Intradermal Once Albertine Patricia, MD      . Warfarin - Pharmacist Dosing Inpatient   Does not apply [place], [place] at 03/07/16 1800     Discharge Medications: Please see discharge summary for a list of discharge medications.  Relevant Imaging Results:  Relevant Lab Results:   Additional Information SSN: [place], [place]

## 2016-03-07 NOTE — Progress Notes (Signed)
[place] for Warfarin Indication: atrial fibrillation  Allergies  Allergen Reactions  . Codeine Anaphylaxis and Nausea And Vomiting    Note: pt has tramadol for years with no reaction  . Morphine And Related Nausea And Vomiting    Severe nausea per daughter  . Valium [Diazepam] Shortness Of Breath  . Sulfa Antibiotics Itching and Rash  . Darvon [Propoxyphene] Nausea And Vomiting  . Floxin [Ofloxacin] Itching  . Levsin [Hyoscyamine Sulfate] Other (See Comments)    Unknown reaction  . Penicillins Itching    Itching reported by [facility] Has patient had a PCN reaction causing immediate rash, facial/tongue/throat swelling, SOB or lightheadedness with hypotension: Yes Has patient had a PCN reaction causing severe rash involving mucus membranes or skin necrosis: No Has patient had a PCN reaction that required hospitalization No Has patient had a PCN reaction occurring within the last 10 years: No If all of the above answers are "NO", then may proceed with Cephalosporin use.   . Zantac [Ranitidine Hcl] Other (See Comments)    Unknown reaction    Patient Measurements: Height: 5\' 9"  (175.3 cm) (per grandson) Weight: 175 lb 4.3 oz (79.5 kg) IBW/kg (Calculated) : 66.2  Vital Signs: Temp: 99.5 F (37.5 C) (12/28 0700) Temp Source: Oral (12/28 0700) BP: 129/63 (12/28 1052) Pulse Rate: 67 (12/28 1052)  Labs:  Recent Labs  03/04/16 1357 03/04/16 1822 03/05/16 0401 03/06/16 0501 03/07/16 0456  HGB 9.5*  --  10.0* 11.2* 9.9*  HCT 29.7*  --  32.5* 35.1* 31.1*  PLT  --   --  168 218 197  LABPROT  --   --  26.5* 25.9* 41.2*  INR  --   --  2.39 2.32 4.12*  CREATININE  --   --  1.91* 1.10* 1.29*  TROPONINI 0.17* 0.12*  --   --   --     Estimated Creatinine Clearance: 38 mL/min (by C-G formula based on SCr of 1.29 mg/dL (H)).   Assessment: 82 y/o F presents to the ED with altered mental status and suspected UTI. Patient  takes warfarin PTA for afib and is to continue inpatient. INR jumped over night, up to 4, no overt s/sx bleeding.  Warfarin PTA dosing: 2 mg on Mon/Fri and 4 mg all other days.   Goal of Therapy:  INR 2-3 Monitor platelets by anticoagulation protocol: Yes   Plan -Hold warfarin  -Daily INR    Hughes Better, PharmD, BCPS Clinical Pharmacist 03/07/2016 11:27 AM

## 2016-03-08 ENCOUNTER — Other Ambulatory Visit: Payer: Self-pay

## 2016-03-08 DIAGNOSIS — R918 Other nonspecific abnormal finding of lung field: Secondary | ICD-10-CM

## 2016-03-08 LAB — BASIC METABOLIC PANEL
Anion gap: 7 (ref 5–15)
BUN: 11 mg/dL (ref 6–20)
CO2: 23 mmol/L (ref 22–32)
Calcium: 8.1 mg/dL — ABNORMAL LOW (ref 8.9–10.3)
Chloride: 111 mmol/L (ref 101–111)
Creatinine, Ser: 1.34 mg/dL — ABNORMAL HIGH (ref 0.44–1.00)
GFR calc Af Amer: 42 mL/min — ABNORMAL LOW (ref >60)
GFR calc non Af Amer: 36 mL/min — ABNORMAL LOW (ref >60)
Glucose, Bld: 92 mg/dL (ref 65–99)
Potassium: 3.3 mmol/L — ABNORMAL LOW (ref 3.5–5.1)
Sodium: 141 mmol/L (ref 135–145)

## 2016-03-08 LAB — CBC
HCT: 29.8 % — ABNORMAL LOW (ref 36.0–46.0)
Hemoglobin: 9.4 g/dL — ABNORMAL LOW (ref 12.0–15.0)
MCH: 25.9 pg — ABNORMAL LOW (ref 26.0–34.0)
MCHC: 31.5 g/dL (ref 30.0–36.0)
MCV: 82.1 fL (ref 78.0–100.0)
Platelets: 232 10*3/uL (ref 150–400)
RBC: 3.63 MIL/uL — ABNORMAL LOW (ref 3.87–5.11)
RDW: 20 % — ABNORMAL HIGH (ref 11.5–15.5)
WBC: 7.4 10*3/uL (ref 4.0–10.5)

## 2016-03-08 LAB — CULTURE, BLOOD (ROUTINE X 2)
Culture: NO GROWTH
Culture: NO GROWTH

## 2016-03-08 LAB — PROTIME-INR
INR: 4.06
Prothrombin Time: 40.5 seconds — ABNORMAL HIGH (ref 11.4–15.2)

## 2016-03-08 LAB — PHOSPHORUS: Phosphorus: 3.1 mg/dL (ref 2.5–4.6)

## 2016-03-08 LAB — MAGNESIUM: Magnesium: 1.9 mg/dL (ref 1.7–2.4)

## 2016-03-08 MED ORDER — GUAIFENESIN ER 600 MG PO TB12: 1200.0000 mg | ORAL_TABLET | Freq: Two times a day (BID) | ORAL | Status: DC

## 2016-03-08 MED ORDER — HYDRALAZINE HCL 25 MG PO TABS: 25.0000 mg | ORAL_TABLET | Freq: Four times a day (QID) | ORAL | Status: DC

## 2016-03-08 MED ORDER — CEFUROXIME AXETIL 250 MG PO TABS: 250.0000 mg | ORAL_TABLET | Freq: Two times a day (BID) | ORAL | Status: AC

## 2016-03-08 MED ORDER — POTASSIUM CHLORIDE CRYS ER 20 MEQ PO TBCR
40.0000 meq | EXTENDED_RELEASE_TABLET | Freq: Once | ORAL | Status: AC
  Administered 2016-03-08: 40 meq via ORAL
  Filled 2016-03-08: qty 2

## 2016-03-08 MED ORDER — FUROSEMIDE 40 MG PO TABS: 40.0000 mg | ORAL_TABLET | Freq: Every day | ORAL | Status: DC

## 2016-03-08 MED ORDER — LORAZEPAM 2 MG/ML IJ SOLN
0.5000 mg | Freq: Once | INTRAMUSCULAR | Status: AC
  Administered 2016-03-08: 0.5 mg via INTRAVENOUS
  Filled 2016-03-08: qty 1

## 2016-03-08 NOTE — Discharge Instructions (Signed)
Follow with Primary MD [name], MD after discharge from SNF  Get CBC, CMP, 2 view Chest X ray checked  by Primary MD next visit.    Activity: As tolerated with Full fall precautions use walker/cane & assistance as needed   Disposition SNF   Diet: Heart Healthy  , with feeding assistance and aspiration precautions.  For Heart failure patients - Check your Weight same time everyday, if you gain over 2 pounds, or you develop in leg swelling, experience more shortness of breath or chest pain, call your Primary MD immediately. Follow Cardiac Low Salt Diet and 1.5 lit/day fluid restriction.   On your next visit with your primary care physician please Get Medicines reviewed and adjusted.   Please request your Prim.MD to go over all Hospital Tests and Procedure/Radiological results at the follow up, please get all Hospital records sent to your Prim MD by signing hospital release before you go home.   If you experience worsening of your admission symptoms, develop shortness of breath, life threatening emergency, suicidal or homicidal thoughts you must seek medical attention immediately by calling 911 or calling your MD immediately  if symptoms less severe.  You Must read complete instructions/literature along with all the possible adverse reactions/side effects for all the Medicines you take and that have been prescribed to you. Take any new Medicines after you have completely understood and accpet all the possible adverse reactions/side effects.   Do not drive, operating heavy machinery, perform activities at heights, swimming or participation in water activities or provide baby sitting services if your were admitted for syncope or siezures until you have seen by Primary MD or a Neurologist and advised to do so again.  Do not drive when taking Pain medications.    Do not take more than prescribed Pain, Sleep and Anxiety Medications  Special Instructions: If you have smoked or  chewed Tobacco  in the last 2 yrs please stop smoking, stop any regular Alcohol  and or any Recreational drug use.  Wear Seat belts while driving.   Please note  You were cared for by a hospitalist during your hospital stay. If you have any questions about your discharge medications or the care you received while you were in the hospital after you are discharged, you can call the unit and asked to speak with the hospitalist on call if the hospitalist that took care of you is not available. Once you are discharged, your primary care physician will handle any further medical issues. Please note that NO REFILLS for any discharge medications will be authorized once you are discharged, as it is imperative that you return to your primary care physician (or establish a relationship with a primary care physician if you do not have one) for your aftercare needs so that they can reassess your need for medications and monitor your lab values.

## 2016-03-08 NOTE — Progress Notes (Signed)
Physical Therapy Treatment Patient Details Name: Tracy Rodriguez MRN: [id number] DOB: 05-28-1931 Today's Date: 03/08/2016    History of Present Illness Patient is a 82 y/o female with hx of HTN, HLD, depressionm PAF, breast ca s/p right mastectomy, bipolar disorder, chronic back pain, possible dementia presents with AMS. Found to have sepsis secondary to UTI.    PT Comments    Pt less distracted and able to advance mobility to gait in halls.  Pt reports fatigue but denies pain.  Pt will continue to require placement at SNF to improve strength and functional mobility before returning home.  Follow Up Recommendations  SNF;Supervision/Assistance - 24 hour     Equipment Recommendations  None recommended by PT    Recommendations for Other Services       Precautions / Restrictions Precautions Precautions: Fall Precaution Comments: h/o multiple falls Restrictions Weight Bearing Restrictions: No    Mobility  Bed Mobility Overal bed mobility: Needs Assistance Bed Mobility: Supine to Sit   Sidelying to sit: Supervision Supine to sit: Supervision     General bed mobility comments: Cues for technique to achieve sitting at edge of bed.    Transfers Overall transfer level: Needs assistance Equipment used: Rolling walker (2 wheeled) Transfers: Sit to/from Stand Sit to Stand: Min assist Stand pivot transfers: Min assist          Ambulation/Gait Ambulation/Gait assistance: Min assist Ambulation Distance (Feet): 150 Feet Assistive device: Rolling walker (2 wheeled) Gait Pattern/deviations: Step-through pattern;Shuffle;Trunk flexed;Decreased stride length     General Gait Details: Cues for negotiating obstacles and assist needed for turning RW.     Stairs            Wheelchair Mobility    Modified Rankin (Stroke Patients Only)       Balance     Sitting balance-Leahy Scale: Fair       Standing balance-Leahy Scale: Fair                       Cognition Arousal/Alertness: Awake/alert Behavior During Therapy: Flat affect Overall Cognitive Status: Impaired/Different from baseline Area of Impairment: Safety/judgement     Memory: Decreased short-term memory Following Commands: Follows multi-step commands with increased time       General Comments: Pt following commands better and less distracted during session.      Exercises      General Comments        Pertinent Vitals/Pain Pain Assessment: No/denies pain    Home Living                      Prior Function            PT Goals (current goals can now be found in the care plan section) Acute Rehab PT Goals Patient Stated Goal: none stated Potential to Achieve Goals: Fair Progress towards PT goals: Progressing toward goals    Frequency    Min 2X/week      PT Plan Current plan remains appropriate    Co-evaluation             End of Session Equipment Utilized During Treatment: Gait belt Activity Tolerance: Patient tolerated treatment well Patient left: in chair;with call bell/phone within reach;with chair alarm set     Time: 1525-1550 PT Time Calculation (min) (ACUTE ONLY): 25 min  Charges:  $Gait Training: 8-22 mins $Therapeutic Activity: 8-22 mins  G Codes:      Cristela Blue 03/08/2016, 3:54 PM  [place]or [place], PTA pager [phone]

## 2016-03-08 NOTE — Progress Notes (Signed)
CRITICAL VALUE ALERT  Critical value received:  PT 40.5       INR 4.06  Date of notification: 03/08/16  Time of notification:  0752  Critical value read back:yes  Nurse who received alert:  Alex Gardener  MD notified (1st page): Dr. Waldron Labs  Time of first page: [phone]  Responding MD:  Dr. Landis Gandy  Time MD responded: [phone]

## 2016-03-08 NOTE — Care Management Note (Signed)
Case Management Note  Patient Details  Name: Tracy Rodriguez MRN: [id number] Date of Birth: 24-Dec-1931  Subjective/Objective:                 Patient from [place]. Will DC to their SNF as facilitated by CSW.    Action/Plan:  DC to SNF Expected Discharge Date:                  Expected Discharge Plan:  Skilled Nursing Facility  In-House Referral:  Clinical Social Work  Discharge planning Services  CM Consult  Post Acute Care Choice:    Choice offered to:     DME Arranged:    DME Agency:     HH Arranged:    [place] Agency:     Status of Service:  Completed, signed off  If discussed at [employer], dates discussed:    Additional Comments:  Carles Collet, RN 03/08/2016, 12:15 PM

## 2016-03-08 NOTE — Progress Notes (Signed)
PROGRESS NOTE                                                                                                                                                                                                             Patient Demographics:    Tracy Rodriguez, is a 82 y.o. female, DOB - 1931-09-08, [id number]  Admit date - 03/03/2016   Admitting Physician Norval Morton, MD  Outpatient Primary MD for the patient is [name], MD  LOS - 4  Chief Complaint  Patient presents with  . Altered Mental Status       Brief Narrative   82 y.o. female with medical history significant of HTN, HLD, PAF, breast cancer s/p right mastectomy, CKD stage III, and bipolar disorder; who presents with acute altered mental status, Workup significant for sepsis secondary to UTI and pneumonia.   Subjective:    [place] today has, No headache, No chest pain, No abdominal pain - No Nausea, Report generalized weakness, reported cough, denies any urinary symptoms.  Assessment  & Plan :    Principal Problem:   Sepsis ([place]) Active Problems:   Bipolar disorder ([place])   Paroxysmal atrial fibrillation ([place])   Acute kidney injury ([place])   Abnormal chest x-ray   Acute renal failure superimposed on stage 3 chronic kidney disease (HCC)   CHF (congestive heart failure) (HCC)   Anemia   Sepsis  - suspected UTI and CAP , she presents with a rectal temperature of 105.6 , respirations up to 24, WBC 11.2, and lactic acid 1.32. With altered mental status . - Workup significant for positive urinalysis, she reports cough, and possible new left upper lung pneumonia . - Sepsis protocol initiated , hypotensive requiring multiple fluid boluses,Hypotensive resolved.  UTI - Urine culture growing coli pansensitive, initially on cefepime,  transitioned to rocephin , will discharge  on another 5 days of oral Ceftin to finish total of 10 days of complicated UTI.   CAP -  Chest x-ray with right upper lung opacity, CT chest significant for masslike right lung nodularity, volume overload versus neoplasm versus infection, and interlobular septal thickening , pulmonary consulted for further evaluation regarding these findings ., The plan is to continue with diuresis, and anterior to treat possible infection, and will need repeat CT after appropriate diuresis, follow-up appointment as an outpatient has  been arranged by PCCM on 04/17/2015, this was discussed with both daughters. - Initially on broad-spectrum antibiotics when she septic, IV vancomycin, cefepime, and azithromycin , currently narrowed to IV Rocephin and azithromycin . - Patient with significant cough and secretion, encourage pulmonary toilet, incentive spirometry, flutter valve, and Mucinex   Elevated troponins - She denies any chest pain, non-ACS pattern, most likely related to sepsis and hypotension - 2-D echo with no wall motion abnormalities  Acute kidney injury on chronic kidney disease stage III:  - Creatinine baseline around 1.09 , elevated creatinine on admission , at 1.9 , this is most likely due to ATN from hypotension and sepsis - Continue to monitor as on diuresis  chronic diastolic CHF  - Patient notes lower extremity swelling and new cough. CXR abnormal with b/l pleural effusions. Last echocardiogram showed EF of 65-75% on 02/09/2016, repeat echo with stable EF, no wall motion abnormality, and grade 1 diastolic dysfunction - continuous pulse oximetry with nasal cannula oxygen to keep O2 sats patient greater 92%. - Continue with IV diuresis  Paroxysmal atrial fibrillation on anticoagulation: - Continue  digoxin, Cardizem and metoprolol and warfarin per pharmacy  Bipolar disorder - Resume home medication  Delirium - Patient with sundowning, would resume her home Seroquel.  Essential hypertension - Initially hypotensive requiring fluid boluses,  - Blood pressure acceptable, continue  with home medication, as well on when necessary hydralazine  Hyperlipidemia - Continue atorvastatin  Anemia:  - continue to monitor    Code Status : DO NOT RESUSCITATE  Family Communication  : None at bedside  Disposition Plan  : for SNF placment in am  Consults  :  None  Procedures  : none  DVT Prophylaxis  : On warfarin  Lab Results  Component Value Date   PLT 232 03/08/2016    Antibiotics  :    Anti-infectives    Start     Dose/Rate Route Frequency Ordered Stop   03/09/16 0000  cefUROXime (CEFTIN) 250 MG tablet     250 mg Oral 2 times daily with meals 03/08/16 1511 03/14/16 2359   03/07/16 1000  azithromycin (ZITHROMAX) tablet 500 mg     500 mg Oral Daily 03/06/16 1112     03/06/16 1300  cefTRIAXone (ROCEPHIN) 2 g in dextrose 5 % 50 mL IVPB     2 g 100 mL/hr over 30 Minutes Intravenous Every 24 hours 03/06/16 1112     03/06/16 0500  ceFEPIme (MAXIPIME) 1 g in dextrose 5 % 50 mL IVPB  Status:  Discontinued     1 g 100 mL/hr over 30 Minutes Intravenous Every 24 hours 03/05/16 1139 03/06/16 1112   03/05/16 0500  azithromycin (ZITHROMAX) 500 mg in dextrose 5 % 250 mL IVPB  Status:  Discontinued     500 mg 250 mL/hr over 60 Minutes Intravenous Every 24 hours 03/04/16 1002 03/06/16 1112   03/05/16 0500  vancomycin (VANCOCIN) IVPB 750 mg/150 ml premix  Status:  Discontinued     750 mg 150 mL/hr over 60 Minutes Intravenous Every 24 hours 03/04/16 1013 03/06/16 1112   03/05/16 0500  ceFEPIme (MAXIPIME) 2 g in dextrose 5 % 50 mL IVPB  Status:  Discontinued     2 g 100 mL/hr over 30 Minutes Intravenous Every 24 hours 03/04/16 1014 03/05/16 1139   03/04/16 0300  vancomycin (VANCOCIN) IVPB 1000 mg/200 mL premix     1,000 mg 200 mL/hr over 60 Minutes Intravenous STAT 03/04/16 0203 03/04/16 0527  03/04/16 0230  azithromycin (ZITHROMAX) 500 mg in dextrose 5 % 250 mL IVPB     500 mg 250 mL/hr over 60 Minutes Intravenous STAT 03/04/16 0204 03/04/16 0528   03/04/16 0115   ceFEPIme (MAXIPIME) 2 g in dextrose 5 % 50 mL IVPB  Status:  Discontinued     2 g 100 mL/hr over 30 Minutes Intravenous Every 24 hours 03/04/16 0041 03/04/16 1014   03/03/16 2300  cefTRIAXone (ROCEPHIN) 1 g in dextrose 5 % 50 mL IVPB     1 g 100 mL/hr over 30 Minutes Intravenous  Once 03/03/16 2252 03/04/16 0107        Objective:   Vitals:   03/08/16 0543 03/08/16 0623 03/08/16 0820 03/08/16 1058  BP: (!) 174/64 (!) 153/54 (!) 160/70 (!) 167/88  Pulse: 90 84  73  Resp: 17     Temp: 98.2 F (36.8 C)     TempSrc: Oral     SpO2: 96%     Weight:      Height:        Wt Readings from Last 3 Encounters:  03/07/16 79.5 kg (175 lb 4.3 oz)  02/11/16 78.2 kg (172 lb 4.8 oz)  01/12/16 77.2 kg (170 lb 3.2 oz)     Intake/Output Summary (Last 24 hours) at 03/08/16 1515 Last data filed at 03/08/16 1210  Gross per 24 hour  Intake              500 ml  Output              400 ml  Net              100 ml     Physical Exam  Awake Alert 2 today, More appropriate [place].AT,PERRAL Supple Neck,No JVD Symmetrical Chest wall movement, Good air movement bilaterally, CTAB RRR,No Gallops,Rubs or new Murmurs, No Parasternal Heave +ve B.Sounds, Abd Soft, No tenderness,  No rebound - guarding or rigidity. No Cyanosis, Clubbing or edema, No new Rash or bruise      Data Review:    CBC  Recent Labs Lab 03/03/16 2142 03/04/16 0211 03/04/16 1357 03/05/16 0401 03/06/16 0501 03/07/16 0456 03/08/16 0639  WBC 11.2* 14.9*  --  10.9* 10.8* 7.6 7.4  HGB 10.9* 9.2* 9.5* 10.0* 11.2* 9.9* 9.4*  HCT 34.6* 29.6* 29.7* 32.5* 35.1* 31.1* 29.8*  PLT 168 156  --  168 218 197 232  MCV 85.6 84.6  --  86.9 82.6 83.4 82.1  MCH 27.0 26.3  --  26.7 26.4 26.5 25.9*  MCHC 31.5 31.1  --  30.8 31.9 31.8 31.5  RDW 20.1* 19.9*  --  20.8* 20.0* 20.1* 20.0*  LYMPHSABS 0.4*  --   --   --   --   --   --   MONOABS 0.2  --   --   --   --   --   --   EOSABS 0.0  --   --   --   --   --   --   BASOSABS 0.0  --   --    --   --   --   --     Chemistries   Recent Labs Lab 03/03/16 2142 03/05/16 0401 03/06/16 0501 03/07/16 0456 03/07/16 1408 03/08/16 0639  NA 137 140 141 140 140 141  K 3.9 4.0 3.7 2.9* 3.4* 3.3*  CL 106 111 111 109 108 111  CO2 24 20* 20* 23 21* 23  GLUCOSE 143* 102*  140* 93 125* 92  BUN 19 25* 15 12 12 11   CREATININE 1.51* 1.91* 1.10* 1.29* 1.32* 1.34*  CALCIUM 8.1* 8.2* 8.6* 8.2* 8.5* 8.1*  MG  --   --   --   --   --  1.9  AST 28  --   --   --   --   --   ALT 21  --   --   --   --   --   ALKPHOS 64  --   --   --   --   --   BILITOT 0.7  --   --   --   --   --    ------------------------------------------------------------------------------------------------------------------ No results for input(s): CHOL, HDL, LDLCALC, TRIG, CHOLHDL, LDLDIRECT in the last 72 hours.  Lab Results  Component Value Date   HGBA1C 5.2 02/08/2016   ------------------------------------------------------------------------------------------------------------------ No results for input(s): TSH, T4TOTAL, T3FREE, THYROIDAB in the last 72 hours.  Invalid input(s): FREET3 ------------------------------------------------------------------------------------------------------------------ No results for input(s): VITAMINB12, FOLATE, FERRITIN, TIBC, IRON, RETICCTPCT in the last 72 hours.  Coagulation profile  Recent Labs Lab 03/04/16 0211 03/05/16 0401 03/06/16 0501 03/07/16 0456 03/08/16 0639  INR 2.34 2.39 2.32 4.12* 4.06*    No results for input(s): DDIMER in the last 72 hours.  Cardiac Enzymes  Recent Labs Lab 03/04/16 0758 03/04/16 1357 03/04/16 1822  TROPONINI 0.29* 0.17* 0.12*   ------------------------------------------------------------------------------------------------------------------    Component Value Date/Time   BNP 420.9 (H) 03/03/2016 2142    Inpatient Medications  Scheduled Meds: . atorvastatin  20 mg Oral Daily  . azithromycin  500 mg Oral Daily  .  carvedilol  6.25 mg Oral BID WC  . cefTRIAXone (ROCEPHIN)  IV  2 g Intravenous Q24H  . diltiazem  120 mg Oral Daily  . escitalopram  10 mg Oral Daily  . ferrous sulfate  325 mg Oral Q breakfast  . furosemide  40 mg Intravenous BID  . guaiFENesin  1,200 mg Oral BID  . hydrALAZINE  25 mg Oral Q6H  . mouth rinse  15 mL Mouth Rinse BID  . potassium chloride SA  40 mEq Oral Daily  . QUEtiapine  25 mg Oral BID  . traZODone  150 mg Oral QHS  . tuberculin  5 Units Intradermal Once  . Warfarin - Pharmacist Dosing Inpatient   Does not apply q1800   Continuous Infusions:  PRN Meds:.acetaminophen **OR** acetaminophen, albuterol, bisacodyl, hydrALAZINE, ondansetron **OR** ondansetron (ZOFRAN) IV, sodium phosphate  Micro Results Recent Results (from the past 240 hour(s))  Blood Culture (routine x 2)     Status: None   Collection Time: 03/03/16  9:35 PM  Result Value Ref Range Status   Specimen Description BLOOD LEFT ANTECUBITAL  Final   Special Requests IN PEDIATRIC BOTTLE 4CC  Final   Culture NO GROWTH 5 DAYS  Final   Report Status 03/08/2016 FINAL  Final  Blood Culture (routine x 2)     Status: None   Collection Time: 03/03/16  9:43 PM  Result Value Ref Range Status   Specimen Description BLOOD RIGHT ARM  Final   Special Requests BOTTLES DRAWN AEROBIC AND ANAEROBIC 10CC EA  Final   Culture NO GROWTH 5 DAYS  Final   Report Status 03/08/2016 FINAL  Final  Urine culture     Status: Abnormal   Collection Time: 03/03/16 10:00 PM  Result Value Ref Range Status   Specimen Description URINE, CATHETERIZED  Final   Special Requests NONE  Final  Culture >=100,000 COLONIES/mL ESCHERICHIA COLI (A)  Final   Report Status 03/06/2016 FINAL  Final   Organism ID, Bacteria ESCHERICHIA COLI (A)  Final      Susceptibility   Escherichia coli - MIC*    AMPICILLIN <=2 SENSITIVE Sensitive     CEFAZOLIN <=4 SENSITIVE Sensitive     CEFTRIAXONE <=1 SENSITIVE Sensitive     CIPROFLOXACIN <=0.25 SENSITIVE  Sensitive     GENTAMICIN <=1 SENSITIVE Sensitive     IMIPENEM <=0.25 SENSITIVE Sensitive     NITROFURANTOIN <=16 SENSITIVE Sensitive     TRIMETH/SULFA <=20 SENSITIVE Sensitive     AMPICILLIN/SULBACTAM <=2 SENSITIVE Sensitive     PIP/TAZO <=4 SENSITIVE Sensitive     Extended ESBL NEGATIVE Sensitive     * >=100,000 COLONIES/mL ESCHERICHIA COLI  MRSA PCR Screening     Status: None   Collection Time: 03/04/16  4:01 AM  Result Value Ref Range Status   MRSA by PCR NEGATIVE NEGATIVE Final    Comment:        The GeneXpert MRSA Assay (FDA approved for NASAL specimens only), is one component of a comprehensive MRSA colonization surveillance program. It is not intended to diagnose MRSA infection nor to guide or monitor treatment for MRSA infections.     Radiology Reports Dg Chest 1 View  Result Date: 03/04/2016 CLINICAL DATA:  SOB since this morning. EXAM: CHEST 1 VIEW COMPARISON:  1 day prior FINDINGS: Patient rotated to the left. Cardiomegaly accentuated by AP portable technique. Small left pleural effusion. No pneumothorax. Mild interstitial edema is new or increased. Improved bibasilar atelectasis. Inferior right upper lobe opacity is not significantly changed and measures on the order of 3.8 cm. IMPRESSION: Development of mild interstitial edema. Persistent left pleural effusion with decreased bibasilar atelectasis. Right upper lobe rounded density is similar to on the most recent exam. Not readily apparent on 02/07/2016, arguing against a neoplastic process. Possibly an area of loculated pleural fluid. Consider further evaluation with repeat PA and lateral radiographs. Electronically Signed   By: Abigail Miyamoto M.D.   On: 03/04/2016 08:46   Dg Chest 2 View  Result Date: 02/07/2016 CLINICAL DATA:  Recent fall with altered mental status EXAM: CHEST  2 VIEW COMPARISON:  12/06/2015 FINDINGS: Cardiac shadow is stable. Thickening of the minor and major fissures are again identified on the right  and stable. No focal infiltrate or sizable effusion is noted. No acute bony abnormality is noted. IMPRESSION: Thickening of the major and minor fissures on the right. No acute abnormality is noted. Electronically Signed   By: Inez Catalina M.D.   On: 02/07/2016 21:51   Ct Head Wo Contrast  Result Date: 03/03/2016 CLINICAL DATA:  Altered mental status. EXAM: CT HEAD WITHOUT CONTRAST TECHNIQUE: Contiguous axial images were obtained from the base of the skull through the vertex without intravenous contrast. COMPARISON:  CT 02/07/2016, MRI 02/08/2016 FINDINGS: Brain: No evidence of acute infarction, hemorrhage, hydrocephalus, extra-axial collection or mass lesion/mass effect. Stable degree of atrophy and chronic small vessel ischemia. Vascular: Atherosclerosis of skullbase vasculature without hyperdense vessel or abnormal calcification. Skull: Normal. Negative for fracture or focal lesion. Sinuses/Orbits: Post bilateral cataract resection. Mild chronic mucosal thickening in the paranasal sinuses. No fluid levels. Mastoid air cells are well aerated. Other: None. IMPRESSION: No acute intracranial abnormality. Electronically Signed   By: Jeb Levering M.D.   On: 03/03/2016 23:10   Ct Head Wo Contrast  Result Date: 02/07/2016 CLINICAL DATA:  Altered mental status EXAM: CT HEAD WITHOUT  CONTRAST TECHNIQUE: Contiguous axial images were obtained from the base of the skull through the vertex without intravenous contrast. COMPARISON:  02/03/2016 FINDINGS: Brain: No evidence of acute infarction, hemorrhage, hydrocephalus, extra-axial collection or mass lesion/mass effect. Mild chronic white matter ischemic change and mild atrophy are seen. Vascular: No hyperdense vessel or unexpected calcification. Skull: Normal. Negative for fracture or focal lesion. Sinuses/Orbits: No acute finding. Other: None. IMPRESSION: No acute intracranial abnormality noted. Electronically Signed   By: Inez Catalina M.D.   On: 02/07/2016 19:40     Ct Chest Wo Contrast  Result Date: 03/05/2016 CLINICAL DATA:  Sepsis with abnormal finding in the right lung on recent chest x-ray EXAM: CT CHEST WITHOUT CONTRAST TECHNIQUE: Multidetector CT imaging of the chest was performed following the standard protocol without IV contrast. COMPARISON:  Chest x-ray 03/04/2016. Chest x-ray 02/07/2016. Abdomen and pelvis CT 08/13/2015. FINDINGS: Cardiovascular: The heart is enlarged. Coronary artery calcification is noted. Ascending thoracic aorta measures 4.1 cm maximum diameter. Atherosclerotic calcification is noted in the wall of the thoracic aorta. Mediastinum/Nodes: Upper normal to mildly enlarged lymph nodes are scattered through the mediastinum. 9 mm short axis precarinal lymph node is identified on image 37 series 201. 13 mm short axis subcarinal lymph nodes seen image 52. Soft tissue fullness noted in both hilar regions which may be related to atelectasis although the degree of lymphadenopathy cannot be excluded on this noncontrast exam. The esophagus has normal imaging features. Lungs/Pleura: Interlobular septal thickening is identified in the upper and lower lobes bilaterally, with a slight right-sided predominance. There is some probable asymmetric pleural-parenchymal scarring left apex with associated calcification. Comparing back to previous chest x-ray from 02/02/2016 and abdomen CT from 08/13/2015, the patient has developed new small bilateral pleural effusions and nodularity associated with the right lung fissures. 3.4 x 4.4 cm lobulated masslike lesion is identified in the right major fissure posteriorly. This is associated with nodular opacity in the minor fissure which is contiguous. The dominant masslike component approaches water attenuation. Associated bilateral lower lobe collapse/consolidation is evident. Upper Abdomen: Unremarkable. Musculoskeletal: Bone windows reveal no worrisome lytic or sclerotic osseous lesions. IMPRESSION: 1. Findings on  recent chest x-ray represent areas of low-attenuation masslike and nodular opacity involving the fissures of the right lung given the relatively rapid interval appearance, this would be unusual for neoplasm and the attenuation of this disease is lower than would be expected for soft tissue. The Nodularity is atypical for simple pleural effusion. As such, malignant involvement of the pleural surface with effusion would be a consideration. Infection/empyema with extubated effusion in the fissure might also present with these features. 2. Bibasilar collapse/consolidation. 3. Interlobular septal thickening, right greater than left. This may be related to pulmonary edema or lymphangitic disease including neoplasm. 4. Borderline mediastinal lymphadenopathy. 5. Coronary artery and thoracic aortic atherosclerosis. Ascending thoracic aorta measures up to 4.1 cm diameter. Recommend annual imaging followup by CTA or MRA. This recommendation follows 2010 ACCF/AHA/AATS/ACR/ASA/SCA/SCAI/SIR/STS/SVM Guidelines for the Diagnosis and Management of Patients with Thoracic Aortic Disease. Circulation. 2010; [id number] Electronically Signed   By: Misty Stanley M.D.   On: 03/05/2016 07:20   Mr Brain Wo Contrast  Result Date: 02/08/2016 CLINICAL DATA:  Fall.  Altered mental status. EXAM: MRI HEAD WITHOUT CONTRAST TECHNIQUE: Multiplanar, multiecho pulse sequences of the brain and surrounding structures were obtained without intravenous contrast. COMPARISON:  Head CT 02/07/2016 FINDINGS: Brain: No acute infarct or intraparenchymal hemorrhage. There is beginning confluent hyperintense T2-weighted signal within the periventricular white matter,  mildly asymmetric T2 hyperintensity and volume loss of the left hippocampus. No mass lesion or midline shift. No hydrocephalus or extra-axial fluid collection. No age advanced atrophy. The midline structures are normal. Vascular: Major intracranial arterial and venous sinus flow voids are  preserved. No evidence of chronic microhemorrhage or amyloid angiopathy. Skull and upper cervical spine: The visualized skull base, calvarium, upper cervical spine and extracranial soft tissues are normal. Sinuses/Orbits: No fluid levels or advanced mucosal thickening. No mastoid effusion. Normal orbits. IMPRESSION: 1. No acute intracranial abnormality. 2. Mild atrophy and findings of chronic microvascular ischemia. Electronically Signed   By: Ulyses Jarred M.D.   On: 02/08/2016 05:21   Dg Chest Port 1 View  Result Date: 03/06/2016 CLINICAL DATA:  Respiratory failure. EXAM: PORTABLE CHEST 1 VIEW COMPARISON:  03/04/2016 CT of the chest on 03/05/2016. FINDINGS: Stable lenticular opacity within the right major fissure. Improved aeration of both lower lung zones. Some residual atelectasis remains in the left lower lung. No edema or pneumothorax. The heart size is stable and within normal limits. Stable mild mediastinal prominence and ectasia of the thoracic aorta. IMPRESSION: Stable opacity within the right major fissure. Improved aeration of both lower lungs. Electronically Signed   By: Aletta Edouard M.D.   On: 03/06/2016 07:48   Dg Chest Port 1 View  Result Date: 03/03/2016 CLINICAL DATA:  Altered mental status. EXAM: PORTABLE CHEST 1 VIEW COMPARISON:  Chest radiograph 02/07/2016 FINDINGS: Cardiomediastinal silhouette is enlarged. Mediastinal contours appear intact. Calcific atherosclerotic disease of the aorta noted. There is no evidence of pneumothorax. Low lung volumes with bibasilar atelectasis. Probable bilateral pleural effusions, greater on the left. Rounded density projecting over the lateral mid right thorax may represent loculated pleural effusion versus pulmonary or pleural based mass. Osseous structures are without acute abnormality. Soft tissues are grossly normal. Breast implants are noted. IMPRESSION: Probable bilateral pleural effusions, greater on the left with bibasilar atelectasis.  Rounded density projecting over the lateral mid right hemithorax may represent loculated pleural effusion versus pulmonary or pleural based mass. Cross-sectional imaging is recommended if further evaluation is clinically desired. Electronically Signed   By: Fidela Salisbury M.D.   On: 03/03/2016 22:23   Dg Knee Complete 4 Views Right  Result Date: 02/07/2016 CLINICAL DATA:  Status post fall, with right knee pain. Initial encounter. EXAM: RIGHT KNEE - COMPLETE 4+ VIEW COMPARISON:  None. FINDINGS: There is no evidence of fracture or dislocation. A bone island is noted at the proximal tibia. The joint spaces are preserved. No significant degenerative change is seen; the patellofemoral joint is grossly unremarkable in appearance. No significant joint effusion is seen. The visualized soft tissues are normal in appearance. IMPRESSION: No evidence of fracture or dislocation. Electronically Signed   By: Garald Balding M.D.   On: 02/07/2016 23:49   Dg Hips Bilat With Pelvis 3-4 Views  Result Date: 02/07/2016 CLINICAL DATA:  Bilateral hip pain following fall, initial encounter EXAM: DG HIP (WITH OR WITHOUT PELVIS) 3-4V BILAT COMPARISON:  None. FINDINGS: Pelvic ring is intact. No acute fracture or dislocation is noted. No soft tissue abnormality is noted. IMPRESSION: No acute abnormality noted. Electronically Signed   By: Inez Catalina M.D.   On: 02/07/2016 21:49     Bill Mcvey M.D on 03/08/2016 at 3:15 PM  Between 7am to 7pm - Pager - [phone]  After 7pm go to [email or web address] - password [facility]  [facility] -  Office  [phone]

## 2016-03-08 NOTE — Care Management Important Message (Signed)
Important Message  Patient Details  Name: Tracy Rodriguez MRN: [id number] Date of Birth: 10-Nov-1931   Medicare Important Message Given:  Yes    Nathen May 03/08/2016, 2:05 PM

## 2016-03-08 NOTE — Progress Notes (Signed)
[place] for Warfarin Indication: atrial fibrillation  Allergies  Allergen Reactions  . Codeine Anaphylaxis and Nausea And Vomiting    Note: pt has tramadol for years with no reaction  . Morphine And Related Nausea And Vomiting    Severe nausea per daughter  . Valium [Diazepam] Shortness Of Breath  . Sulfa Antibiotics Itching and Rash  . Darvon [Propoxyphene] Nausea And Vomiting  . Floxin [Ofloxacin] Itching  . Levsin [Hyoscyamine Sulfate] Other (See Comments)    Unknown reaction  . Penicillins Itching    Itching reported by [facility] Has patient had a PCN reaction causing immediate rash, facial/tongue/throat swelling, SOB or lightheadedness with hypotension: Yes Has patient had a PCN reaction causing severe rash involving mucus membranes or skin necrosis: No Has patient had a PCN reaction that required hospitalization No Has patient had a PCN reaction occurring within the last 10 years: No If all of the above answers are "NO", then may proceed with Cephalosporin use.   . Zantac [Ranitidine Hcl] Other (See Comments)    Unknown reaction    Patient Measurements: Height: 5\' 9"  (175.3 cm) (per grandson) Weight: 175 lb 4.3 oz (79.5 kg) IBW/kg (Calculated) : 66.2  Vital Signs: Temp: 98.2 F (36.8 C) (12/29 0543) Temp Source: Oral (12/29 0543) BP: 160/70 (12/29 0820) Pulse Rate: 84 (12/29 0623)  Labs:  Recent Labs  03/06/16 0501 03/07/16 0456 03/07/16 1408 03/08/16 0639  HGB 11.2* 9.9*  --  9.4*  HCT 35.1* 31.1*  --  29.8*  PLT 218 197  --  232  LABPROT 25.9* 41.2*  --  40.5*  INR 2.32 4.12*  --  4.06*  CREATININE 1.10* 1.29* 1.32* 1.34*    Estimated Creatinine Clearance: 36.5 mL/min (by C-G formula based on SCr of 1.34 mg/dL (H)).   Assessment: 82 y/o F presents to the ED with altered mental status and suspected UTI. Patient takes warfarin PTA for afib and is to continue inpatient. INR jumped over night, up  to 4, no overt s/sx bleeding.  Warfarin PTA dosing: 2 mg on Mon/Fri and 4 mg all other days.   Goal of Therapy:  INR 2-3 Monitor platelets by anticoagulation protocol: Yes   Plan -Hold warfarin  -Daily INR  Thank you Anette Guarneri, PharmD [phone]  03/08/2016 9:15 AM

## 2016-03-09 DIAGNOSIS — F339 Major depressive disorder, recurrent, unspecified: Secondary | ICD-10-CM | POA: Diagnosis not present

## 2016-03-09 DIAGNOSIS — R2689 Other abnormalities of gait and mobility: Secondary | ICD-10-CM | POA: Diagnosis not present

## 2016-03-09 DIAGNOSIS — N390 Urinary tract infection, site not specified: Secondary | ICD-10-CM | POA: Diagnosis not present

## 2016-03-09 DIAGNOSIS — F319 Bipolar disorder, unspecified: Secondary | ICD-10-CM | POA: Diagnosis not present

## 2016-03-09 DIAGNOSIS — E784 Other hyperlipidemia: Secondary | ICD-10-CM | POA: Diagnosis not present

## 2016-03-09 DIAGNOSIS — G4700 Insomnia, unspecified: Secondary | ICD-10-CM | POA: Diagnosis not present

## 2016-03-09 DIAGNOSIS — J181 Lobar pneumonia, unspecified organism: Secondary | ICD-10-CM | POA: Diagnosis not present

## 2016-03-09 DIAGNOSIS — I5033 Acute on chronic diastolic (congestive) heart failure: Secondary | ICD-10-CM | POA: Diagnosis not present

## 2016-03-09 DIAGNOSIS — J189 Pneumonia, unspecified organism: Secondary | ICD-10-CM | POA: Diagnosis not present

## 2016-03-09 DIAGNOSIS — I5032 Chronic diastolic (congestive) heart failure: Secondary | ICD-10-CM | POA: Diagnosis not present

## 2016-03-09 DIAGNOSIS — B9620 Unspecified Escherichia coli [E. coli] as the cause of diseases classified elsewhere: Secondary | ICD-10-CM | POA: Diagnosis not present

## 2016-03-09 DIAGNOSIS — R938 Abnormal findings on diagnostic imaging of other specified body structures: Secondary | ICD-10-CM | POA: Diagnosis not present

## 2016-03-09 DIAGNOSIS — E876 Hypokalemia: Secondary | ICD-10-CM | POA: Diagnosis not present

## 2016-03-09 DIAGNOSIS — F314 Bipolar disorder, current episode depressed, severe, without psychotic features: Secondary | ICD-10-CM | POA: Diagnosis not present

## 2016-03-09 DIAGNOSIS — R41841 Cognitive communication deficit: Secondary | ICD-10-CM | POA: Diagnosis not present

## 2016-03-09 DIAGNOSIS — R531 Weakness: Secondary | ICD-10-CM | POA: Diagnosis not present

## 2016-03-09 DIAGNOSIS — N3281 Overactive bladder: Secondary | ICD-10-CM | POA: Diagnosis not present

## 2016-03-09 DIAGNOSIS — N183 Chronic kidney disease, stage 3 (moderate): Secondary | ICD-10-CM | POA: Diagnosis not present

## 2016-03-09 DIAGNOSIS — R6520 Severe sepsis without septic shock: Secondary | ICD-10-CM | POA: Diagnosis not present

## 2016-03-09 DIAGNOSIS — M549 Dorsalgia, unspecified: Secondary | ICD-10-CM | POA: Diagnosis not present

## 2016-03-09 DIAGNOSIS — I480 Paroxysmal atrial fibrillation: Secondary | ICD-10-CM | POA: Diagnosis not present

## 2016-03-09 DIAGNOSIS — M6281 Muscle weakness (generalized): Secondary | ICD-10-CM | POA: Diagnosis not present

## 2016-03-09 DIAGNOSIS — M6289 Other specified disorders of muscle: Secondary | ICD-10-CM | POA: Diagnosis not present

## 2016-03-09 DIAGNOSIS — D638 Anemia in other chronic diseases classified elsewhere: Secondary | ICD-10-CM | POA: Diagnosis not present

## 2016-03-09 LAB — PROTIME-INR
INR: 3.2
Prothrombin Time: 33.5 seconds — ABNORMAL HIGH (ref 11.4–15.2)

## 2016-03-09 MED ORDER — TRAMADOL HCL 50 MG PO TABS: 50.0000 mg | ORAL_TABLET | Freq: Four times a day (QID) | ORAL | 0 refills | Status: DC | PRN

## 2016-03-09 MED ORDER — WARFARIN SODIUM 2 MG PO TABS
2.0000 mg | ORAL_TABLET | Freq: Once | ORAL | Status: DC
  Filled 2016-03-09: qty 1

## 2016-03-09 MED ORDER — TRAZODONE HCL 150 MG PO TABS: ORAL_TABLET | ORAL | 0 refills | Status: DC

## 2016-03-09 NOTE — Discharge Summary (Signed)
[facility]  Physician Discharge Summary   Patient ID: Tracy Rodriguez MRN: [id number] DOB/AGE: Dec 08, 1931 82 y.o.  Admit date: 03/03/2016 Discharge date: 03/09/2016  PCP: [name], MD  DISCHARGE DIAGNOSES:  Principal Problem:   Sepsis [facility]) Active Problems:   Bipolar disorder ([place])   Paroxysmal atrial fibrillation ([place])   Acute kidney injury ([place])   Abnormal chest x-ray   Acute renal failure superimposed on stage 3 chronic kidney disease (HCC)   CHF (congestive heart failure) ([place])   Anemia   RECOMMENDATIONS FOR OUTPATIENT FOLLOW UP: CBC and basic metabolic panel in 4-5 days. Will need repeat CT scan of her chest after adequate diuresis. She has an appointment with pulmonology in February.    DISCHARGE CONDITION: fair  Diet recommendation: Heart healthy  Filed Weights   03/06/16 0335 03/07/16 0347 03/09/16 0500  Weight: 81.9 kg (180 lb 8.9 oz) 79.5 kg (175 lb 4.3 oz) 79.7 kg (175 lb 11.2 oz)    INITIAL HISTORY: 82 y.o.femalewith medical history significant of HTN, HLD, PAF, breast cancer s/p right mastectomy, CKD stage III, and bipolar disorder; who presents with acute altered mental status, Workup significant for sepsis secondary to UTI and pneumonia.  Consultations:  None  Procedures:  None  HOSPITAL COURSE:   Sepsis  Patient presented with a rectal temperature of 105.6 , respirations up to 24, WBC 11.2, and lactic acid 1.32. With altered mental status. Workup significant for positive urinalysis, she reports cough, and possible new left upper lung pneumonia. Sepsis protocol was initiated. She was initially hypotensive requiring multiple fluid boluses. Patient was stabilized.  UTI Urine culture growing e coli pansensitive, initially on cefepime,  transitioned to rocephin , will discharge on another 5 days of oral Ceftin to finish total of 10 days of complicated UTI.  CAP Chest x-ray with right upper lung opacity, CT chest  significant for masslike right lung nodularity, volume overload versus neoplasm versus infection, and interlobular septal thickening. Pulmonary consulted for further evaluation regarding these findings. The plan is to continue with diuresis, and to treat possible infection, and will need repeat CT after appropriate diuresis, follow-up appointment as an outpatient has been arranged by PCCM on 04/17/2015. Initially on broad-spectrum antibiotics when she septic, IV vancomycin, cefepime, and azithromycin , then narrowed to IV Rocephin and azithromycin. She has completed a course of antibiotics.  Elevated troponins She denies any chest pain, non-ACS pattern, most likely related to sepsis and hypotension. 2-D echo with no wall motion abnormalities.  Acute kidney injury on chronic kidney disease stage III Creatinine baseline around 1.09 , elevated creatinine on admission , at 1.9 , this is most likely due to ATN from hypotension and sepsis. Renal function has been stable. We'll recommend repeating renal function panel at the skilled nursing facility in a few days.  Acute on chronic diastolic CHF  Patient notes lower extremity swelling and new cough. CXR abnormal with b/l pleural effusions. Last echocardiogram showed EF of 65-75% on 02/09/2016, repeat echo with stable EF, no wall motion abnormality, and grade 1 diastolic dysfunction. Patient was given IV Lasix. She has improved. She'll be discharged on her usual dose of oral furosemide.  Paroxysmalatrial fibrillation on anticoagulation: Continue  digoxin, Cardizem and metoprolol and warfarin per pharmacy  Bipolar disorder Resume home medication  Delirium Patient with sundowning. Seroquel was resumed. Symptoms are stable.  Essential hypertension Initially hypotensive requiring fluid boluses. Blood pressure acceptable, continue with home medication.  Hyperlipidemia Continue atorvastatin  Anemia:  Continue to monitor  Thoracic aortic  aneurysm. Will need annual evaluation.  Overall improved. Discussed with her daughters in detail. Stable for discharge today.    PERTINENT LABS:  The results of significant diagnostics from this hospitalization (including imaging, microbiology, ancillary and laboratory) are listed below for reference.    Microbiology: Recent Results (from the past 240 hour(s))  Blood Culture (routine x 2)     Status: None   Collection Time: 03/03/16  9:35 PM  Result Value Ref Range Status   Specimen Description BLOOD LEFT ANTECUBITAL  Final   Special Requests IN PEDIATRIC BOTTLE 4CC  Final   Culture NO GROWTH 5 DAYS  Final   Report Status 03/08/2016 FINAL  Final  Blood Culture (routine x 2)     Status: None   Collection Time: 03/03/16  9:43 PM  Result Value Ref Range Status   Specimen Description BLOOD RIGHT ARM  Final   Special Requests BOTTLES DRAWN AEROBIC AND ANAEROBIC 10CC EA  Final   Culture NO GROWTH 5 DAYS  Final   Report Status 03/08/2016 FINAL  Final  Urine culture     Status: Abnormal   Collection Time: 03/03/16 10:00 PM  Result Value Ref Range Status   Specimen Description URINE, CATHETERIZED  Final   Special Requests NONE  Final   Culture >=100,000 COLONIES/mL ESCHERICHIA COLI (A)  Final   Report Status 03/06/2016 FINAL  Final   Organism ID, Bacteria ESCHERICHIA COLI (A)  Final      Susceptibility   Escherichia coli - MIC*    AMPICILLIN <=2 SENSITIVE Sensitive     CEFAZOLIN <=4 SENSITIVE Sensitive     CEFTRIAXONE <=1 SENSITIVE Sensitive     CIPROFLOXACIN <=0.25 SENSITIVE Sensitive     GENTAMICIN <=1 SENSITIVE Sensitive     IMIPENEM <=0.25 SENSITIVE Sensitive     NITROFURANTOIN <=16 SENSITIVE Sensitive     TRIMETH/SULFA <=20 SENSITIVE Sensitive     AMPICILLIN/SULBACTAM <=2 SENSITIVE Sensitive     PIP/TAZO <=4 SENSITIVE Sensitive     Extended ESBL NEGATIVE Sensitive     * >=100,000 COLONIES/mL ESCHERICHIA COLI  MRSA PCR Screening     Status: None   Collection Time:  03/04/16  4:01 AM  Result Value Ref Range Status   MRSA by PCR NEGATIVE NEGATIVE Final    Comment:        The GeneXpert MRSA Assay (FDA approved for NASAL specimens only), is one component of a comprehensive MRSA colonization surveillance program. It is not intended to diagnose MRSA infection nor to guide or monitor treatment for MRSA infections.      Labs: Basic Metabolic Panel:  Recent Labs Lab 03/05/16 0401 03/06/16 0501 03/07/16 0456 03/07/16 1408 03/08/16 0639  NA 140 141 140 140 141  K 4.0 3.7 2.9* 3.4* 3.3*  CL 111 111 109 108 111  CO2 20* 20* 23 21* 23  GLUCOSE 102* 140* 93 125* 92  BUN 25* 15 12 12 11   CREATININE 1.91* 1.10* 1.29* 1.32* 1.34*  CALCIUM 8.2* 8.6* 8.2* 8.5* 8.1*  MG  --   --   --   --  1.9  PHOS  --   --   --   --  3.1   Liver Function Tests:  Recent Labs Lab 03/03/16 2142  AST 28  ALT 21  ALKPHOS 64  BILITOT 0.7  PROT 6.4*  ALBUMIN 3.0*    Recent Labs Lab 03/04/16 0444  AMMONIA 24   CBC:  Recent Labs Lab 03/03/16 2142 03/04/16 0211 03/04/16 1357 03/05/16 0401 03/06/16  0501 03/07/16 0456 03/08/16 0639  WBC 11.2* 14.9*  --  10.9* 10.8* 7.6 7.4  NEUTROABS 10.6*  --   --   --   --   --   --   HGB 10.9* 9.2* 9.5* 10.0* 11.2* 9.9* 9.4*  HCT 34.6* 29.6* 29.7* 32.5* 35.1* 31.1* 29.8*  MCV 85.6 84.6  --  86.9 82.6 83.4 82.1  PLT 168 156  --  168 218 197 232   Cardiac Enzymes:  Recent Labs Lab 03/03/16 2204 03/04/16 0520 03/04/16 0758 03/04/16 1357 03/04/16 1822  CKTOTAL 31*  --   --   --   --   TROPONINI  --  0.33* 0.29* 0.17* 0.12*   BNP: BNP (last 3 results)  Recent Labs  10/22/15 1847 03/03/16 2142  BNP 118.7* 420.9*    CBG:  Recent Labs Lab 03/03/16 2134 03/04/16 0203 03/04/16 0632  GLUCAP 140* 137* 210*     IMAGING STUDIES Dg Chest 1 View  Result Date: 03/04/2016 CLINICAL DATA:  SOB since this morning. EXAM: CHEST 1 VIEW COMPARISON:  1 day prior FINDINGS: Patient rotated to the left.  Cardiomegaly accentuated by AP portable technique. Small left pleural effusion. No pneumothorax. Mild interstitial edema is new or increased. Improved bibasilar atelectasis. Inferior right upper lobe opacity is not significantly changed and measures on the order of 3.8 cm. IMPRESSION: Development of mild interstitial edema. Persistent left pleural effusion with decreased bibasilar atelectasis. Right upper lobe rounded density is similar to on the most recent exam. Not readily apparent on 02/07/2016, arguing against a neoplastic process. Possibly an area of loculated pleural fluid. Consider further evaluation with repeat PA and lateral radiographs. Electronically Signed   By: Abigail Miyamoto M.D.   On: 03/04/2016 08:46   Ct Head Wo Contrast  Result Date: 03/03/2016 CLINICAL DATA:  Altered mental status. EXAM: CT HEAD WITHOUT CONTRAST TECHNIQUE: Contiguous axial images were obtained from the base of the skull through the vertex without intravenous contrast. COMPARISON:  CT 02/07/2016, MRI 02/08/2016 FINDINGS: Brain: No evidence of acute infarction, hemorrhage, hydrocephalus, extra-axial collection or mass lesion/mass effect. Stable degree of atrophy and chronic small vessel ischemia. Vascular: Atherosclerosis of skullbase vasculature without hyperdense vessel or abnormal calcification. Skull: Normal. Negative for fracture or focal lesion. Sinuses/Orbits: Post bilateral cataract resection. Mild chronic mucosal thickening in the paranasal sinuses. No fluid levels. Mastoid air cells are well aerated. Other: None. IMPRESSION: No acute intracranial abnormality. Electronically Signed   By: Jeb Levering M.D.   On: 03/03/2016 23:10   Ct Chest Wo Contrast  Result Date: 03/05/2016 CLINICAL DATA:  Sepsis with abnormal finding in the right lung on recent chest x-ray EXAM: CT CHEST WITHOUT CONTRAST TECHNIQUE: Multidetector CT imaging of the chest was performed following the standard protocol without IV contrast.  COMPARISON:  Chest x-ray 03/04/2016. Chest x-ray 02/07/2016. Abdomen and pelvis CT 08/13/2015. FINDINGS: Cardiovascular: The heart is enlarged. Coronary artery calcification is noted. Ascending thoracic aorta measures 4.1 cm maximum diameter. Atherosclerotic calcification is noted in the wall of the thoracic aorta. Mediastinum/Nodes: Upper normal to mildly enlarged lymph nodes are scattered through the mediastinum. 9 mm short axis precarinal lymph node is identified on image 37 series 201. 13 mm short axis subcarinal lymph nodes seen image 52. Soft tissue fullness noted in both hilar regions which may be related to atelectasis although the degree of lymphadenopathy cannot be excluded on this noncontrast exam. The esophagus has normal imaging features. Lungs/Pleura: Interlobular septal thickening is identified in the upper and  lower lobes bilaterally, with a slight right-sided predominance. There is some probable asymmetric pleural-parenchymal scarring left apex with associated calcification. Comparing back to previous chest x-ray from 02/02/2016 and abdomen CT from 08/13/2015, the patient has developed new small bilateral pleural effusions and nodularity associated with the right lung fissures. 3.4 x 4.4 cm lobulated masslike lesion is identified in the right major fissure posteriorly. This is associated with nodular opacity in the minor fissure which is contiguous. The dominant masslike component approaches water attenuation. Associated bilateral lower lobe collapse/consolidation is evident. Upper Abdomen: Unremarkable. Musculoskeletal: Bone windows reveal no worrisome lytic or sclerotic osseous lesions. IMPRESSION: 1. Findings on recent chest x-ray represent areas of low-attenuation masslike and nodular opacity involving the fissures of the right lung given the relatively rapid interval appearance, this would be unusual for neoplasm and the attenuation of this disease is lower than would be expected for soft  tissue. The Nodularity is atypical for simple pleural effusion. As such, malignant involvement of the pleural surface with effusion would be a consideration. Infection/empyema with extubated effusion in the fissure might also present with these features. 2. Bibasilar collapse/consolidation. 3. Interlobular septal thickening, right greater than left. This may be related to pulmonary edema or lymphangitic disease including neoplasm. 4. Borderline mediastinal lymphadenopathy. 5. Coronary artery and thoracic aortic atherosclerosis. Ascending thoracic aorta measures up to 4.1 cm diameter. Recommend annual imaging followup by CTA or MRA. This recommendation follows 2010 ACCF/AHA/AATS/ACR/ASA/SCA/SCAI/SIR/STS/SVM Guidelines for the Diagnosis and Management of Patients with Thoracic Aortic Disease. Circulation. 2010; [id number] Electronically Signed   By: Misty Stanley M.D.   On: 03/05/2016 07:20   Dg Chest Port 1 View  Result Date: 03/06/2016 CLINICAL DATA:  Respiratory failure. EXAM: PORTABLE CHEST 1 VIEW COMPARISON:  03/04/2016 CT of the chest on 03/05/2016. FINDINGS: Stable lenticular opacity within the right major fissure. Improved aeration of both lower lung zones. Some residual atelectasis remains in the left lower lung. No edema or pneumothorax. The heart size is stable and within normal limits. Stable mild mediastinal prominence and ectasia of the thoracic aorta. IMPRESSION: Stable opacity within the right major fissure. Improved aeration of both lower lungs. Electronically Signed   By: Aletta Edouard M.D.   On: 03/06/2016 07:48   Dg Chest Port 1 View  Result Date: 03/03/2016 CLINICAL DATA:  Altered mental status. EXAM: PORTABLE CHEST 1 VIEW COMPARISON:  Chest radiograph 02/07/2016 FINDINGS: Cardiomediastinal silhouette is enlarged. Mediastinal contours appear intact. Calcific atherosclerotic disease of the aorta noted. There is no evidence of pneumothorax. Low lung volumes with bibasilar  atelectasis. Probable bilateral pleural effusions, greater on the left. Rounded density projecting over the lateral mid right thorax may represent loculated pleural effusion versus pulmonary or pleural based mass. Osseous structures are without acute abnormality. Soft tissues are grossly normal. Breast implants are noted. IMPRESSION: Probable bilateral pleural effusions, greater on the left with bibasilar atelectasis. Rounded density projecting over the lateral mid right hemithorax may represent loculated pleural effusion versus pulmonary or pleural based mass. Cross-sectional imaging is recommended if further evaluation is clinically desired. Electronically Signed   By: Fidela Salisbury M.D.   On: 03/03/2016 22:23    DISCHARGE EXAMINATION: Vitals:   03/08/16 2228 03/08/16 2352 03/09/16 0500 03/09/16 0631  BP: (!) 146/52 (!) 134/42  (!) 156/58  Pulse: 74     Resp: 16     Temp: 98.1 F (36.7 C)     TempSrc: Oral     SpO2: 96%     Weight:  79.7 kg (175 lb 11.2 oz)   Height:       General appearance: alert, cooperative and no distress Resp: clear to auscultation bilaterally Cardio: regular rate and rhythm, S1, S2 normal, no murmur, click, rub or gallop GI: soft, non-tender; bowel sounds normal; no masses,  no organomegaly Extremities: extremities normal, atraumatic, no cyanosis or edema  DISPOSITION: Skilled Nursing facility  Discharge Instructions    (HEART FAILURE PATIENTS) Call MD:  Anytime you have any of the following symptoms: 1) 3 pound weight gain in 24 hours or 5 pounds in 1 week 2) shortness of breath, with or without a dry hacking cough 3) swelling in the hands, feet or stomach 4) if you have to sleep on extra pillows at night in order to breathe.    Complete by:  As directed    Call MD for:  extreme fatigue    Complete by:  As directed    Call MD for:  persistant nausea and vomiting    Complete by:  As directed    Call MD for:  severe uncontrolled pain    Complete by:  As  directed    Call MD for:  temperature >100.4    Complete by:  As directed    Diet - low sodium heart healthy    Complete by:  As directed    Discharge instructions    Complete by:  As directed    Follow with Primary MD [name], MD after discharge from SNF  Get CBC, CMP, 2 view Chest X ray checked  by Primary MD next visit.    Activity: As tolerated with Full fall precautions use walker/cane & assistance as needed   Disposition SNF   Diet: Heart Healthy  , with feeding assistance and aspiration precautions.  For Heart failure patients - Check your Weight same time everyday, if you gain over 2 pounds, or you develop in leg swelling, experience more shortness of breath or chest pain, call your Primary MD immediately. Follow Cardiac Low Salt Diet and 1.5 lit/day fluid restriction.   On your next visit with your primary care physician please Get Medicines reviewed and adjusted.   Please request your Prim.MD to go over all Hospital Tests and Procedure/Radiological results at the follow up, please get all Hospital records sent to your Prim MD by signing hospital release before you go home.   If you experience worsening of your admission symptoms, develop shortness of breath, life threatening emergency, suicidal or homicidal thoughts you must seek medical attention immediately by calling 911 or calling your MD immediately  if symptoms less severe.  You Must read complete instructions/literature along with all the possible adverse reactions/side effects for all the Medicines you take and that have been prescribed to you. Take any new Medicines after you have completely understood and accpet all the possible adverse reactions/side effects.   Do not drive, operating heavy machinery, perform activities at heights, swimming or participation in water activities or provide baby sitting services if your were admitted for syncope or siezures until you have seen by Primary MD or a  Neurologist and advised to do so again.  Do not drive when taking Pain medications.    Do not take more than prescribed Pain, Sleep and Anxiety Medications  Special Instructions: If you have smoked or chewed Tobacco  in the last 2 yrs please stop smoking, stop any regular Alcohol  and or any Recreational drug use.  Wear Seat belts while driving.   Please  note  You were cared for by a hospitalist during your hospital stay. If you have any questions about your discharge medications or the care you received while you were in the hospital after you are discharged, you can call the unit and asked to speak with the hospitalist on call if the hospitalist that took care of you is not available. Once you are discharged, your primary care physician will handle any further medical issues. Please note that NO REFILLS for any discharge medications will be authorized once you are discharged, as it is imperative that you return to your primary care physician (or establish a relationship with a primary care physician if you do not have one) for your aftercare needs so that they can reassess your need for medications and monitor your lab values.   Discharge instructions    Complete by:  As directed    CBC and basic metabolic panel in 4-5 days.  You were cared for by a hospitalist during your hospital stay. If you have any questions about your discharge medications or the care you received while you were in the hospital after you are discharged, you can call the unit and asked to speak with the hospitalist on call if the hospitalist that took care of you is not available. Once you are discharged, your primary care physician will handle any further medical issues. Please note that NO REFILLS for any discharge medications will be authorized once you are discharged, as it is imperative that you return to your primary care physician (or establish a relationship with a primary care physician if you do not have one) for  your aftercare needs so that they can reassess your need for medications and monitor your lab values. If you do not have a primary care physician, you can call [phone] for a physician referral.   Increase activity slowly    Complete by:  As directed    Increase activity slowly    Complete by:  As directed       ALLERGIES:  Allergies  Allergen Reactions  . Codeine Anaphylaxis and Nausea And Vomiting    Note: pt has tramadol for years with no reaction  . Morphine And Related Nausea And Vomiting    Severe nausea per daughter  . Valium [Diazepam] Shortness Of Breath  . Sulfa Antibiotics Itching and Rash  . Darvon [Propoxyphene] Nausea And Vomiting  . Floxin [Ofloxacin] Itching  . Levsin [Hyoscyamine Sulfate] Other (See Comments)    Unknown reaction  . Penicillins Itching    Itching reported by [facility] Has patient had a PCN reaction causing immediate rash, facial/tongue/throat swelling, SOB or lightheadedness with hypotension: Yes Has patient had a PCN reaction causing severe rash involving mucus membranes or skin necrosis: No Has patient had a PCN reaction that required hospitalization No Has patient had a PCN reaction occurring within the last 10 years: No If all of the above answers are "NO", then may proceed with Cephalosporin use.   . Zantac [Ranitidine Hcl] Other (See Comments)    Unknown reaction     Current Discharge Medication List    START taking these medications   Details  cefUROXime (CEFTIN) 250 MG tablet Take 1 tablet (250 mg total) by mouth 2 (two) times daily with a meal.    guaiFENesin (MUCINEX) 600 MG 12 hr tablet Take 2 tablets (1,200 mg total) by mouth 2 (two) times daily.    hydrALAZINE (APRESOLINE) 25 MG tablet Take 1 tablet (25 mg  total) by mouth every 6 (six) hours.      CONTINUE these medications which have CHANGED   Details  furosemide (LASIX) 40 MG tablet Take 1 tablet (40 mg total) by mouth daily.      CONTINUE these  medications which have NOT CHANGED   Details  acetaminophen (TYLENOL) 325 MG tablet Take 2 tablets (650 mg total) by mouth every 6 (six) hours as needed for mild pain (or Fever >/= 101).    atorvastatin (LIPITOR) 20 MG tablet Take 2 tablets (40 mg total) by mouth daily. Qty: 90 tablet, Refills: 3    carvedilol (COREG) 6.25 MG tablet Take 1 tablet (6.25 mg total) by mouth 2 (two) times daily with a meal.    Dextromethorphan Polistirex (DELSYM PO) Take 10 mLs by mouth every 12 (twelve) hours as needed.    digoxin (LANOXIN) 0.125 MG tablet Take 1 tablet (125 mcg total) by mouth daily. Qty: 90 tablet, Refills: 3    diltiazem (DILACOR XR) 120 MG 24 hr capsule Take 120 mg by mouth daily.    escitalopram (LEXAPRO) 10 MG tablet TAKE 1 TABLET(10 MG) BY MOUTH DAILY Qty: 90 tablet, Refills: 1    ferrous sulfate 325 (65 FE) MG tablet Take 325 mg by mouth daily with breakfast.    gabapentin (NEURONTIN) 300 MG capsule Take 1 capsule (300 mg total) by mouth 2 (two) times daily. Qty: 180 capsule, Refills: 3    MYRBETRIQ 25 MG TB24 tablet TAKE 1 TABLET(25 MG) BY MOUTH DAILY Qty: 30 tablet, Refills: 5    potassium chloride SA (K-DUR,KLOR-CON) 20 MEQ tablet Take 1 tablet (20 mEq total) by mouth daily.    QUEtiapine (SEROQUEL) 25 MG tablet Take 25 mg by mouth 2 (two) times daily with a meal.     traMADol (ULTRAM) 50 MG tablet Take 1 tablet (50 mg total) by mouth every 6 (six) hours as needed for moderate pain. Qty: 20 tablet, Refills: 0    traZODone (DESYREL) 150 MG tablet Take 150 mg by mouth at bedtime Qty: 30 tablet, Refills: 0    warfarin (COUMADIN) 4 MG tablet Take as directed by anticoagulation clinic. Qty: 90 tablet, Refills: 0         Follow-up Information    Tera Partridge, MD Follow up on 04/16/2016.   Specialty:  Pulmonary Disease Why:  Appt at 10:15 AM regarding follow up of CT of Chest.   Contact information: [address] [place] [address] [phone]          [name], MD Follow up.   Specialty:  Internal Medicine Contact information: [place] [address] [phone]           TOTAL DISCHARGE TIME: 25 minutes  [facility] Pager [phone]  03/09/2016, 12:28 PM

## 2016-03-09 NOTE — Progress Notes (Signed)
[place] for Warfarin Indication: atrial fibrillation  Assessment: 82 y/o F presents to the ED with altered mental status and suspected UTI. Patient takes warfarin PTA for afib and is to continue inpatient. Possible DDI with azithromycin (started 12/25) and ceftriaxone (started 12/27). INR peaked at 4.12 on 12/28, now trending back down after 2 doses held. Today's INR is 3.2 (supratherapeutic), CBC stable, no overt s/sx bleeding.  Warfarin PTA dosing: 2 mg on Mon/Fri and 4 mg all other days - INR on admission 2.3 (therapeutic)  Goal of Therapy:  INR 2-3 Monitor platelets by anticoagulation protocol: Yes   Plan -Warfarin 2 mg x 1 dose tonight  -Daily INR   Allergies  Allergen Reactions  . Codeine Anaphylaxis and Nausea And Vomiting    Note: pt has tramadol for years with no reaction  . Morphine And Related Nausea And Vomiting    Severe nausea per daughter  . Valium [Diazepam] Shortness Of Breath  . Sulfa Antibiotics Itching and Rash  . Darvon [Propoxyphene] Nausea And Vomiting  . Floxin [Ofloxacin] Itching  . Levsin [Hyoscyamine Sulfate] Other (See Comments)    Unknown reaction  . Penicillins Itching    Itching reported by [facility] Has patient had a PCN reaction causing immediate rash, facial/tongue/throat swelling, SOB or lightheadedness with hypotension: Yes Has patient had a PCN reaction causing severe rash involving mucus membranes or skin necrosis: No Has patient had a PCN reaction that required hospitalization No Has patient had a PCN reaction occurring within the last 10 years: No If all of the above answers are "NO", then may proceed with Cephalosporin use.   . Zantac [Ranitidine Hcl] Other (See Comments)    Unknown reaction    Patient Measurements: Height: 5\' 9"  (175.3 cm) (per grandson) Weight: 175 lb 11.2 oz (79.7 kg) IBW/kg (Calculated) : 66.2  Vital Signs: Temp: 98.1 F (36.7 C) (12/29 2228) Temp  Source: Oral (12/29 2228) BP: 156/58 (12/30 0631) Pulse Rate: 74 (12/29 2228)  Labs:  Recent Labs  03/07/16 0456 03/07/16 1408 03/08/16 0639 03/09/16 0606  HGB 9.9*  --  9.4*  --   HCT 31.1*  --  29.8*  --   PLT 197  --  232  --   LABPROT 41.2*  --  40.5* 33.5*  INR 4.12*  --  4.06* 3.20  CREATININE 1.29* 1.32* 1.34*  --     Estimated Creatinine Clearance: 36.6 mL/min (by C-G formula based on SCr of 1.34 mg/dL (H)).  Belia Heman, PharmD PGY1 Pharmacy Resident [phone] (Pager) 03/09/2016 9:57 AM

## 2016-03-09 NOTE — Clinical Social Work Placement (Signed)
   CLINICAL SOCIAL WORK PLACEMENT  NOTE  Date:  03/09/2016  Patient Details  Name: Tracy Rodriguez MRN: [id number] Date of Birth: 11/01/1931  Clinical Social Work is seeking post-discharge placement for this patient at the [place] level of care (*CSW will initial, date and re-position this form in  chart as items are completed):  Yes   Patient/family provided with [facility] Work Department's list of facilities offering this level of care within the geographic area requested by the patient (or if unable, by the patient's family).  Yes   Patient/family informed of their freedom to choose among providers that offer the needed level of care, that participate in Medicare, Medicaid or managed care program needed by the patient, have an available bed and are willing to accept the patient.  Yes   Patient/family informed of [facility]'s ownership interest in [facility] and [facility], as well as of the fact that they are under no obligation to receive care at these facilities.  PASRR submitted to EDS on       PASRR number received on       Existing PASRR number confirmed on 03/07/16     FL2 transmitted to all facilities in geographic area requested by pt/family on 03/07/16     FL2 transmitted to all facilities within larger geographic area on       Patient informed that his/her managed care company has contracts with or will negotiate with certain facilities, including the following:        Yes   Patient/family informed of bed offers received.  Patient chooses bed at [facility]     Physician recommends and patient chooses bed at      Patient to be transferred to [facility] on 03/09/16.  Patient to be transferred to facility by Ambulance     Patient family notified on 03/09/16 of transfer.  Name of family member notified:  Daughter [name] Winthrop     PHYSICIAN Please sign FL2, Please sign DNR     Additional Comment:     _______________________________________________ Rigoberto Noel, LCSW 03/09/2016, 1:05 PM

## 2016-03-12 ENCOUNTER — Telehealth: Payer: Self-pay | Admitting: Internal Medicine

## 2016-03-12 ENCOUNTER — Encounter: Payer: Self-pay | Admitting: Internal Medicine

## 2016-03-12 ENCOUNTER — Non-Acute Institutional Stay (SKILLED_NURSING_FACILITY): Payer: Medicare Other | Admitting: Internal Medicine

## 2016-03-12 ENCOUNTER — Other Ambulatory Visit: Payer: Self-pay | Admitting: Internal Medicine

## 2016-03-12 DIAGNOSIS — R531 Weakness: Secondary | ICD-10-CM

## 2016-03-12 DIAGNOSIS — M549 Dorsalgia, unspecified: Secondary | ICD-10-CM

## 2016-03-12 DIAGNOSIS — D638 Anemia in other chronic diseases classified elsewhere: Secondary | ICD-10-CM | POA: Diagnosis not present

## 2016-03-12 DIAGNOSIS — R9389 Abnormal findings on diagnostic imaging of other specified body structures: Secondary | ICD-10-CM

## 2016-03-12 DIAGNOSIS — R938 Abnormal findings on diagnostic imaging of other specified body structures: Secondary | ICD-10-CM | POA: Diagnosis not present

## 2016-03-12 DIAGNOSIS — F319 Bipolar disorder, unspecified: Secondary | ICD-10-CM

## 2016-03-12 DIAGNOSIS — I5032 Chronic diastolic (congestive) heart failure: Secondary | ICD-10-CM | POA: Diagnosis not present

## 2016-03-12 DIAGNOSIS — E784 Other hyperlipidemia: Secondary | ICD-10-CM

## 2016-03-12 DIAGNOSIS — J181 Lobar pneumonia, unspecified organism: Secondary | ICD-10-CM

## 2016-03-12 DIAGNOSIS — N390 Urinary tract infection, site not specified: Secondary | ICD-10-CM | POA: Diagnosis not present

## 2016-03-12 DIAGNOSIS — E876 Hypokalemia: Secondary | ICD-10-CM

## 2016-03-12 DIAGNOSIS — N183 Chronic kidney disease, stage 3 unspecified: Secondary | ICD-10-CM

## 2016-03-12 DIAGNOSIS — J189 Pneumonia, unspecified organism: Secondary | ICD-10-CM

## 2016-03-12 DIAGNOSIS — N3281 Overactive bladder: Secondary | ICD-10-CM

## 2016-03-12 DIAGNOSIS — I480 Paroxysmal atrial fibrillation: Secondary | ICD-10-CM

## 2016-03-12 DIAGNOSIS — E7849 Other hyperlipidemia: Secondary | ICD-10-CM

## 2016-03-12 DIAGNOSIS — B9620 Unspecified Escherichia coli [E. coli] as the cause of diseases classified elsewhere: Secondary | ICD-10-CM

## 2016-03-12 DIAGNOSIS — G8929 Other chronic pain: Secondary | ICD-10-CM

## 2016-03-12 NOTE — Telephone Encounter (Signed)
Spoke with Kathlee Nations, RN @ [address]

## 2016-03-12 NOTE — Progress Notes (Signed)
LOCATION: [place]  PCP: [name], MD   Code Status: DNR  Goals of care: Advanced Directive information Advanced Directives 03/12/2016  Does Patient Have a Medical Advance Directive? Yes  Type of Advance Directive Out of facility DNR (pink MOST or yellow form)  Does patient want to make changes to medical advance directive? No - Patient declined  Copy of [place] in Chart? -  Would patient like information on creating a medical advance directive? -       Extended Emergency Contact Information Primary Emergency Contact: Joyce,Beth Address: [address]           [place], [place] [address] [place] [employer] [place] Phone: [phone] Relation: Daughter Secondary Emergency Contact: Hibler,[name] Address: [address]          [place] [address] [place] [employer] Phone: [phone] Relation: Daughter   Allergies  Allergen Reactions  . Codeine Anaphylaxis and Nausea And Vomiting    Note: pt has tramadol for years with no reaction  . Morphine And Related Nausea And Vomiting    Severe nausea per daughter  . Valium [Diazepam] Shortness Of Breath  . Sulfa Antibiotics Itching and Rash  . Darvon [Propoxyphene] Nausea And Vomiting  . Floxin [Ofloxacin] Itching  . Levsin [Hyoscyamine Sulfate] Other (See Comments)    Unknown reaction  . Penicillins Itching    Itching reported by [facility] Has patient had a PCN reaction causing immediate rash, facial/tongue/throat swelling, SOB or lightheadedness with hypotension: Yes Has patient had a PCN reaction causing severe rash involving mucus membranes or skin necrosis: No Has patient had a PCN reaction that required hospitalization No Has patient had a PCN reaction occurring within the last 10 years: No If all of the above answers are "NO", then may proceed with Cephalosporin use.   . Zantac [Ranitidine Hcl] Other (See Comments)    Unknown  reaction    Chief Complaint  Patient presents with  . New Admit To SNF    New Admission Visit      HPI:  Patient is a 83 y.o. female seen today for short term rehabilitation post hospital admission from 03/03/2016-03/09/2016 with sepsis and altered mental status from Escherichia coli UTI and pneumonia. She also had acute on chronic renal disease and acute on chronic congestive heart failure. She required IV antibiotics, IV Lasix and IV fluids. She has medical history of atrial fibrillation, bipolar disorder, hypertension, breast cancer status post right mastectomy, CKD stage III, chronic congestive heart failure among others. She is seen in her room today.  Review of Systems:  Constitutional: Negative for fever, chills, diaphoresis. Feels weak and tired.   HENT: Negative for headache, congestion, nasal discharge, sore throat, difficulty swallowing.   Eyes: Negative for double vision and discharge.  Respiratory: Negative for cough, shortness of breath and wheezing.   Cardiovascular: Negative for chest pain, palpitations, leg swelling.  Gastrointestinal: Negative for heartburn, nausea, vomiting, abdominal pain. Last bowel movement was yesterday.  Genitourinary: Negative for dysuria Musculoskeletal: Negative for back pain, fall in the facility.  Skin: Negative for itching, rash.  Neurological: Negative for dizziness. Psychiatric/Behavioral: Negative for depression   Past Medical History:  Diagnosis Date  . Arthritis    "joints" (08/04/2014)  . Bipolar 1 disorder ([place])   . Cancer of right breast ([place])   . Chronic lower back pain   . Hyperlipemia   . Hypertension   . PAF (paroxysmal atrial  fibrillation) [facility])    Past Surgical History:  Procedure Laterality Date  . BACK SURGERY    . BREAST BIOPSY Right   . BREAST RECONSTRUCTION Right   . CATARACT EXTRACTION W/ INTRAOCULAR LENS  IMPLANT, BILATERAL Bilateral   . EXCISIONAL HEMORRHOIDECTOMY    . [place]; 1973; 1985    "ruptured discs each time"  . MASTECTOMY Right    cancer  . PLACEMENT OF BREAST IMPLANTS Bilateral    "had to take tissue out of left"  . TONSILLECTOMY    . VAGINAL HYSTERECTOMY     Social History:   reports that she has never smoked. She has never used smokeless tobacco. She reports that she does not drink alcohol or use drugs.  Family History  Problem Relation Age of Onset  . Stroke Mother   . Heart disease Father   . Alzheimer's disease Sister   . Leukemia Brother   . Arthritis Maternal Grandmother   . Breast cancer Daughter     Medications: Allergies as of 03/12/2016      Reactions   Codeine Anaphylaxis, Nausea And Vomiting   Note: pt has tramadol for years with no reaction   Morphine And Related Nausea And Vomiting   Severe nausea per daughter   Valium [diazepam] Shortness Of Breath   Sulfa Antibiotics Itching, Rash   Darvon [propoxyphene] Nausea And Vomiting   Floxin [ofloxacin] Itching   Levsin [hyoscyamine Sulfate] Other (See Comments)   Unknown reaction   Penicillins Itching   Itching reported by [facility] Has patient had a PCN reaction causing immediate rash, facial/tongue/throat swelling, SOB or lightheadedness with hypotension: Yes Has patient had a PCN reaction causing severe rash involving mucus membranes or skin necrosis: No Has patient had a PCN reaction that required hospitalization No Has patient had a PCN reaction occurring within the last 10 years: No If all of the above answers are "NO", then may proceed with Cephalosporin use.   Zantac [ranitidine Hcl] Other (See Comments)   Unknown reaction      Medication List       Accurate as of 03/12/16 10:52 AM. Always use your most recent med list.          acetaminophen 325 MG tablet Commonly known as:  TYLENOL Take 2 tablets (650 mg total) by mouth every 6 (six) hours as needed for mild pain (or Fever >/= 101).   atorvastatin 20 MG tablet Commonly known as:  LIPITOR Take  2 tablets (40 mg total) by mouth daily.   carvedilol 6.25 MG tablet Commonly known as:  COREG Take 1 tablet (6.25 mg total) by mouth 2 (two) times daily with a meal.   cefUROXime 250 MG tablet Commonly known as:  CEFTIN Take 1 tablet (250 mg total) by mouth 2 (two) times daily with a meal.   DELSYM PO Take 10 mLs by mouth every 12 (twelve) hours as needed.   digoxin 0.125 MG tablet Commonly known as:  LANOXIN Take 0.125 mg by mouth daily.   diltiazem 120 MG 24 hr capsule Commonly known as:  DILACOR XR Take 120 mg by mouth daily.   escitalopram 10 MG tablet Commonly known as:  LEXAPRO TAKE 1 TABLET(10 MG) BY MOUTH DAILY   ferrous sulfate 325 (65 FE) MG tablet Take 325 mg by mouth daily with breakfast.   furosemide 40 MG tablet Commonly known as:  LASIX Take 1 tablet (40 mg total) by mouth daily.   gabapentin 100  MG capsule Commonly known as:  NEURONTIN Take 200 mg by mouth 2 (two) times daily.   hydrALAZINE 25 MG tablet Commonly known as:  APRESOLINE Take 1 tablet (25 mg total) by mouth every 6 (six) hours.   potassium chloride SA 20 MEQ tablet Commonly known as:  K-DUR,KLOR-CON Take 1 tablet (20 mEq total) by mouth daily.   QUEtiapine 25 MG tablet Commonly known as:  SEROQUEL Take 25 mg by mouth daily.   traMADol 50 MG tablet Commonly known as:  ULTRAM Take 1 tablet (50 mg total) by mouth every 6 (six) hours as needed for moderate pain.   traZODone 150 MG tablet Commonly known as:  DESYREL Take 150 mg by mouth at bedtime       Immunizations: Immunization History  Administered Date(s) Administered  . Influenza Whole 12/11/2012  . Influenza, High Dose Seasonal PF 11/25/2014  . Influenza,inj,Quad PF,36+ Mos 12/06/2015  . Influenza-Unspecified 12/29/2013  . Pneumococcal Conjugate-13 03/29/2013  . Pneumococcal Polysaccharide-23 03/12/2010     Physical Exam: Vitals:   03/12/16 1038  BP: (!) 132/52  Pulse: 69  Resp: 16  Temp: (!) 96.8 F (36 C)    TempSrc: Oral  SpO2: 93%  Weight: 175 lb 11.2 oz (79.7 kg)  Height: 5\' 9"  (1.753 m)   Body mass index is 25.95 kg/m.  General- elderly female, well built, in no acute distress Head- normocephalic, atraumatic Nose- no maxillary or frontal sinus tenderness, no nasal discharge Throat- moist mucus membrane  Eyes- PERRLA, EOMI, no pallor, no icterus Neck- no cervical lymphadenopathy Cardiovascular- normal s1,s2, no murmur Respiratory- bilateral clear to auscultation, no wheeze, no rhonchi, no crackles, no use of accessory muscles Abdomen- bowel sounds present, soft, non tender Musculoskeletal- able to move all 4 extremities, generalized weakness, on a wheelchair, trace leg edema Neurological- alert and oriented to person, place and time Skin- warm and dry, easy bruising Psychiatry- normal mood and affect    Labs reviewed: Basic Metabolic Panel:  Recent Labs  12/05/15 1838  12/07/15 0321  03/07/16 0456 03/07/16 1408 03/08/16 0639  NA  --   < > 142  < > 140 140 141  K  --   < > 3.5  < > 2.9* 3.4* 3.3*  CL  --   < > 112*  < > 109 108 111  CO2  --   < > 26  < > 23 21* 23  GLUCOSE  --   < > 105*  < > 93 125* 92  BUN  --   < > 11  < > 12 12 11   CREATININE  --   < > 0.99  < > 1.29* 1.32* 1.34*  CALCIUM  --   < > 8.5*  < > 8.2* 8.5* 8.1*  MG 2.1  --   --   --   --   --  1.9  PHOS 2.4*  --  2.6  --   --   --  3.1  < > = values in this interval not displayed. Liver Function Tests:  Recent Labs  12/22/15 1617 02/07/16 2021 03/03/16 2142  AST 15 25 28   ALT 7 17 21   ALKPHOS 68 59 64  BILITOT 0.2 0.5 0.7  PROT 6.4 6.8 6.4*  ALBUMIN 3.6 3.7 3.0*   No results for input(s): LIPASE, AMYLASE in the last 8760 hours.  Recent Labs  03/04/16 0444  AMMONIA 24   CBC:  Recent Labs  01/12/16 1713 02/07/16 2021  03/03/16 2142  03/06/16 0501 03/07/16 0456 03/08/16 0639  WBC 6.5 7.5  < > 11.2*  < > 10.8* 7.6 7.4  NEUTROABS 4,680 5.5  --  10.6*  --   --   --   --   HGB  10.8* 11.7*  < > 10.9*  < > 11.2* 9.9* 9.4*  HCT 35.1 37.3  < > 34.6*  < > 35.1* 31.1* 29.8*  MCV 79.8* 83.8  < > 85.6  < > 82.6 83.4 82.1  PLT 262 214  < > 168  < > 218 197 232  < > = values in this interval not displayed. Cardiac Enzymes:  Recent Labs  02/07/16 2247  03/03/16 2204  03/04/16 0758 03/04/16 1357 03/04/16 1822  CKTOTAL 74  --  31*  --   --   --   --   TROPONINI 0.03*  < >  --   < > 0.29* 0.17* 0.12*  < > = values in this interval not displayed. BNP: Invalid input(s): POCBNP CBG:  Recent Labs  03/03/16 2134 03/04/16 0203 03/04/16 0632  GLUCAP 140* 137* 210*    Radiological Exams: Dg Chest 1 View  Result Date: 03/04/2016 CLINICAL DATA:  SOB since this morning. EXAM: CHEST 1 VIEW COMPARISON:  1 day prior FINDINGS: Patient rotated to the left. Cardiomegaly accentuated by AP portable technique. Small left pleural effusion. No pneumothorax. Mild interstitial edema is new or increased. Improved bibasilar atelectasis. Inferior right upper lobe opacity is not significantly changed and measures on the order of 3.8 cm. IMPRESSION: Development of mild interstitial edema. Persistent left pleural effusion with decreased bibasilar atelectasis. Right upper lobe rounded density is similar to on the most recent exam. Not readily apparent on 02/07/2016, arguing against a neoplastic process. Possibly an area of loculated pleural fluid. Consider further evaluation with repeat PA and lateral radiographs. Electronically Signed   By: Abigail Miyamoto M.D.   On: 03/04/2016 08:46   Ct Head Wo Contrast  Result Date: 03/03/2016 CLINICAL DATA:  Altered mental status. EXAM: CT HEAD WITHOUT CONTRAST TECHNIQUE: Contiguous axial images were obtained from the base of the skull through the vertex without intravenous contrast. COMPARISON:  CT 02/07/2016, MRI 02/08/2016 FINDINGS: Brain: No evidence of acute infarction, hemorrhage, hydrocephalus, extra-axial collection or mass lesion/mass effect. Stable  degree of atrophy and chronic small vessel ischemia. Vascular: Atherosclerosis of skullbase vasculature without hyperdense vessel or abnormal calcification. Skull: Normal. Negative for fracture or focal lesion. Sinuses/Orbits: Post bilateral cataract resection. Mild chronic mucosal thickening in the paranasal sinuses. No fluid levels. Mastoid air cells are well aerated. Other: None. IMPRESSION: No acute intracranial abnormality. Electronically Signed   By: Jeb Levering M.D.   On: 03/03/2016 23:10   Ct Chest Wo Contrast  Result Date: 03/05/2016 CLINICAL DATA:  Sepsis with abnormal finding in the right lung on recent chest x-ray EXAM: CT CHEST WITHOUT CONTRAST TECHNIQUE: Multidetector CT imaging of the chest was performed following the standard protocol without IV contrast. COMPARISON:  Chest x-ray 03/04/2016. Chest x-ray 02/07/2016. Abdomen and pelvis CT 08/13/2015. FINDINGS: Cardiovascular: The heart is enlarged. Coronary artery calcification is noted. Ascending thoracic aorta measures 4.1 cm maximum diameter. Atherosclerotic calcification is noted in the wall of the thoracic aorta. Mediastinum/Nodes: Upper normal to mildly enlarged lymph nodes are scattered through the mediastinum. 9 mm short axis precarinal lymph node is identified on image 37 series 201. 13 mm short axis subcarinal lymph nodes seen image 52. Soft tissue fullness noted in both hilar regions which may be related  to atelectasis although the degree of lymphadenopathy cannot be excluded on this noncontrast exam. The esophagus has normal imaging features. Lungs/Pleura: Interlobular septal thickening is identified in the upper and lower lobes bilaterally, with a slight right-sided predominance. There is some probable asymmetric pleural-parenchymal scarring left apex with associated calcification. Comparing back to previous chest x-ray from 02/02/2016 and abdomen CT from 08/13/2015, the patient has developed new small bilateral pleural effusions  and nodularity associated with the right lung fissures. 3.4 x 4.4 cm lobulated masslike lesion is identified in the right major fissure posteriorly. This is associated with nodular opacity in the minor fissure which is contiguous. The dominant masslike component approaches water attenuation. Associated bilateral lower lobe collapse/consolidation is evident. Upper Abdomen: Unremarkable. Musculoskeletal: Bone windows reveal no worrisome lytic or sclerotic osseous lesions. IMPRESSION: 1. Findings on recent chest x-ray represent areas of low-attenuation masslike and nodular opacity involving the fissures of the right lung given the relatively rapid interval appearance, this would be unusual for neoplasm and the attenuation of this disease is lower than would be expected for soft tissue. The Nodularity is atypical for simple pleural effusion. As such, malignant involvement of the pleural surface with effusion would be a consideration. Infection/empyema with extubated effusion in the fissure might also present with these features. 2. Bibasilar collapse/consolidation. 3. Interlobular septal thickening, right greater than left. This may be related to pulmonary edema or lymphangitic disease including neoplasm. 4. Borderline mediastinal lymphadenopathy. 5. Coronary artery and thoracic aortic atherosclerosis. Ascending thoracic aorta measures up to 4.1 cm diameter. Recommend annual imaging followup by CTA or MRA. This recommendation follows 2010 ACCF/AHA/AATS/ACR/ASA/SCA/SCAI/SIR/STS/SVM Guidelines for the Diagnosis and Management of Patients with Thoracic Aortic Disease. Circulation. 2010; [id number] Electronically Signed   By: Misty Stanley M.D.   On: 03/05/2016 07:20   Dg Chest Port 1 View  Result Date: 03/06/2016 CLINICAL DATA:  Respiratory failure. EXAM: PORTABLE CHEST 1 VIEW COMPARISON:  03/04/2016 CT of the chest on 03/05/2016. FINDINGS: Stable lenticular opacity within the right major fissure. Improved  aeration of both lower lung zones. Some residual atelectasis remains in the left lower lung. No edema or pneumothorax. The heart size is stable and within normal limits. Stable mild mediastinal prominence and ectasia of the thoracic aorta. IMPRESSION: Stable opacity within the right major fissure. Improved aeration of both lower lungs. Electronically Signed   By: Aletta Edouard M.D.   On: 03/06/2016 07:48   Dg Chest Port 1 View  Result Date: 03/03/2016 CLINICAL DATA:  Altered mental status. EXAM: PORTABLE CHEST 1 VIEW COMPARISON:  Chest radiograph 02/07/2016 FINDINGS: Cardiomediastinal silhouette is enlarged. Mediastinal contours appear intact. Calcific atherosclerotic disease of the aorta noted. There is no evidence of pneumothorax. Low lung volumes with bibasilar atelectasis. Probable bilateral pleural effusions, greater on the left. Rounded density projecting over the lateral mid right thorax may represent loculated pleural effusion versus pulmonary or pleural based mass. Osseous structures are without acute abnormality. Soft tissues are grossly normal. Breast implants are noted. IMPRESSION: Probable bilateral pleural effusions, greater on the left with bibasilar atelectasis. Rounded density projecting over the lateral mid right hemithorax may represent loculated pleural effusion versus pulmonary or pleural based mass. Cross-sectional imaging is recommended if further evaluation is clinically desired. Electronically Signed   By: Fidela Salisbury M.D.   On: 03/03/2016 22:23    Assessment/Plan  Generalized weakness From physical deconditioning in the setting of sepsis. Will have her work with physical therapy and occupational therapy to help regain her strength and balance.  Monitor her oral intake. Fall precautions to be taken.  Escherichia coli urinary tract infection Continue Ceftin 250 mg twice a day until 03/14/2016. Hydration to be maintained. Perineal hygiene to be maintained.    Community-acquired pneumonia Left upper lobe. Status post IV Rocephin and azithromycin in the hospital and has completed her antibiotic course. Breathing currently stable. Monitor clinically.  Hypokalemia Monitor BMP with her on Lasix. Continue potassium chloride 20 mEq daily.  Abnormal CT chest finding Findings on CT chest concerning for mosque-like right lung nodularity versus volume overload versus neoplasm versus infection. Patient was treated this hospital admission for CHF exacerbation and pneumonia. Pulmonary team was consulted in the hospital. Plan is to get a repeat CT scan of chest in one month and follow-up with pulmonary team on 04/16/2016.   Chronic congestive heart failure Continue Lasix 40 mg daily, carvedilol 6.25 mg twice a day, Hydralazine 25 mg 4 times a day and digoxin. Monitor BMP and check her weights 3 days a week. To apply TED hose. Monitor her breathing.   Atrial fibrillation Rate controlled. Continue diltiazem 120 mg daily, carvedilol 6.25 mg twice a day and digoxin 0.125 mg daily. inr today 2.7. Her coumadin was on hold x 2 days with supratherapeutic inr. Start coumadin 3.5 mg daily for now and check inr 03/16/15.   Chronic kidney disease stage III Monitor BMP   Anemia of chronic disease Monitor CBC. Continue ferrous sulfate supplement  Chronic back pain Currently on gabapentin 200 mg twice a day. On chart review, per d/c summary, needs to be on 300 mg bid, make changes. Continue tramadol 50 mg q6h prn pain. PMR consult  Bipolar disorder Continue Seroquel 25 mg daily, trazodone 150 mg daily and Lexapro 10 mg daily. Get psychiatric consult.  Hyperlipidemia Continue atorvastatin  OAB Patient on myrbetriq per d/c summary but has not been receiving it at the facility. Start myrbetriq 25 mg daily.   Goals of care: short term rehabilitation   Labs/tests ordered: cbc, bmp 03/15/15  Family/ staff Communication: reviewed care plan with patient and nursing  supervisor    Blanchie Serve, MD Internal Medicine [place], [place] [address] Cell Phone (Monday-Friday 8 am - 5 pm): [phone] On Call: [phone] and follow prompts after 5 pm and on weekends Office Phone: [phone] Office Fax: [phone]

## 2016-03-12 NOTE — Telephone Encounter (Signed)
Kathlee Nations would like callback concerning pt coumadin. Pt has an appt on 03-18-16

## 2016-03-14 ENCOUNTER — Encounter: Payer: Self-pay | Admitting: Adult Health

## 2016-03-14 ENCOUNTER — Non-Acute Institutional Stay (SKILLED_NURSING_FACILITY): Payer: Medicare Other | Admitting: Adult Health

## 2016-03-14 DIAGNOSIS — M549 Dorsalgia, unspecified: Secondary | ICD-10-CM

## 2016-03-14 DIAGNOSIS — J181 Lobar pneumonia, unspecified organism: Secondary | ICD-10-CM

## 2016-03-14 DIAGNOSIS — N390 Urinary tract infection, site not specified: Secondary | ICD-10-CM | POA: Diagnosis not present

## 2016-03-14 DIAGNOSIS — I5032 Chronic diastolic (congestive) heart failure: Secondary | ICD-10-CM

## 2016-03-14 DIAGNOSIS — B9620 Unspecified Escherichia coli [E. coli] as the cause of diseases classified elsewhere: Secondary | ICD-10-CM

## 2016-03-14 DIAGNOSIS — D638 Anemia in other chronic diseases classified elsewhere: Secondary | ICD-10-CM | POA: Diagnosis not present

## 2016-03-14 DIAGNOSIS — I480 Paroxysmal atrial fibrillation: Secondary | ICD-10-CM

## 2016-03-14 DIAGNOSIS — J189 Pneumonia, unspecified organism: Secondary | ICD-10-CM

## 2016-03-14 DIAGNOSIS — N3281 Overactive bladder: Secondary | ICD-10-CM

## 2016-03-14 DIAGNOSIS — E876 Hypokalemia: Secondary | ICD-10-CM

## 2016-03-14 DIAGNOSIS — R531 Weakness: Secondary | ICD-10-CM | POA: Diagnosis not present

## 2016-03-14 DIAGNOSIS — R9389 Abnormal findings on diagnostic imaging of other specified body structures: Secondary | ICD-10-CM

## 2016-03-14 DIAGNOSIS — E784 Other hyperlipidemia: Secondary | ICD-10-CM | POA: Diagnosis not present

## 2016-03-14 DIAGNOSIS — N183 Chronic kidney disease, stage 3 unspecified: Secondary | ICD-10-CM

## 2016-03-14 DIAGNOSIS — E7849 Other hyperlipidemia: Secondary | ICD-10-CM

## 2016-03-14 DIAGNOSIS — F319 Bipolar disorder, unspecified: Secondary | ICD-10-CM

## 2016-03-14 DIAGNOSIS — R938 Abnormal findings on diagnostic imaging of other specified body structures: Secondary | ICD-10-CM | POA: Diagnosis not present

## 2016-03-14 DIAGNOSIS — R4189 Other symptoms and signs involving cognitive functions and awareness: Secondary | ICD-10-CM

## 2016-03-14 DIAGNOSIS — G8929 Other chronic pain: Secondary | ICD-10-CM

## 2016-03-14 LAB — CBC AND DIFFERENTIAL
HCT: 32 % — AB (ref 36–46)
Hemoglobin: 9.8 g/dL — AB (ref 12.0–16.0)
Neutrophils Absolute: 4 /uL
Platelets: 357 10*3/uL (ref 150–399)
WBC: 6.1 10^3/mL

## 2016-03-14 NOTE — Progress Notes (Signed)
DATE:  03/14/2016   MRN:  [id number]  BIRTHDAY: [birth year]  Facility:  Nursing Home Location:  [facility] [place] Room Number: [id number]  LEVEL OF CARE:  SNF (31)  Contact Information    Name Relation Home Work [place] Daughter   [phone]   Bua,[name] Daughter   [phone]   Joyce,Ian Relative   [phone]       Code Status History    Date Active Date Inactive Code Status Order ID Comments User Context   03/04/2016 12:41 AM 03/09/2016  9:44 PM DNR [id number]  Norval Morton, MD ED   02/07/2016 10:50 PM 02/12/2016  6:37 PM DNR [id number]  Ivor Costa, MD ED   12/05/2015  2:28 PM 12/07/2015  9:05 PM DNR [id number]  Samella Parr, NP ED   08/04/2014  5:53 AM 08/05/2014  4:04 PM Full Code [id number]  Phillips Grout, MD Inpatient    Questions for Most Recent Historical Code Status (Order [id number])    Question Answer Comment   In the event of cardiac or respiratory ARREST Do not call a "code blue"    In the event of cardiac or respiratory ARREST Do not perform Intubation, CPR, defibrillation or ACLS    In the event of cardiac or respiratory ARREST Use medication by any route, position, wound care, and other measures to relive pain and suffering. May use oxygen, suction and manual treatment of airway obstruction as needed for comfort.         Advance Directive Documentation   Flowsheet Row Most Recent Value  Type of Advance Directive  Out of facility DNR (pink MOST or yellow form)  Pre-existing out of facility DNR order (yellow form or pink MOST form)  No data  "MOST" Form in Place?  No data       Chief Complaint  Patient presents with  . Discharge Note    HISTORY OF PRESENT ILLNESS:  This is an 36-YO female who is for discharge to ALF with home health PT, OT, CNA, ST and nursing.  She has been admitted to [facility] on 03/09/16 for short-term rehabilitation following a stay at [facility] from 03/03/16-03/09/16 for acute  altered mental status.  Workup revealed sepsis secondary to UTI and pneumonia. She also had an acute on chronic renal disease and acute on chronic CHF. She was given IV antibiotics, IV Lasix and IV fluids. She has PMH of atrial fibrillation, bipolar disorder, hypertension, breast cancer S/P right mastectomy, CKD stage III and CHF.  Patient was admitted to this facility for short-term rehabilitation after the patient's recent hospitalization.  Patient has completed SNF rehabilitation and therapy has cleared the patient for discharge.   PAST MEDICAL HISTORY:  Past Medical History:  Diagnosis Date  . Arthritis    "joints" (08/04/2014)  . Bipolar 1 disorder ([place])   . Cancer of right breast ([place])   . Chronic lower back pain   . Hyperlipemia   . Hypertension   . PAF (paroxysmal atrial fibrillation) ([facility])      CURRENT MEDICATIONS: Reviewed  Patient's Medications  New Prescriptions   No medications on file  Previous Medications   ACETAMINOPHEN (TYLENOL) 325 MG TABLET    Take 2 tablets (650 mg total) by mouth every 6 (six) hours as needed for mild pain (or Fever >/= 101).   ATORVASTATIN (LIPITOR) 20 MG TABLET    Take 2 tablets (40 mg total) by mouth daily.   CARVEDILOL (  COREG) 6.25 MG TABLET    Take 1 tablet (6.25 mg total) by mouth 2 (two) times daily with a meal.   CEFUROXIME (CEFTIN) 250 MG TABLET    Take 1 tablet (250 mg total) by mouth 2 (two) times daily with a meal.   DEXTROMETHORPHAN POLISTIREX (DELSYM PO)    Take 10 mLs by mouth every 12 (twelve) hours as needed.   DIGOXIN (LANOXIN) 0.125 MG TABLET    Take 0.125 mg by mouth daily.   DILTIAZEM (DILACOR XR) 120 MG 24 HR CAPSULE    Take 120 mg by mouth daily.   DOCOSANOL (ABREVA) 10 % CREA    Apply 1 application topically 4 (four) times daily. Apply to upper lip   ESCITALOPRAM (LEXAPRO) 10 MG TABLET    TAKE 1 TABLET(10 MG) BY MOUTH DAILY   FERROUS SULFATE 325 (65 FE) MG TABLET    Take 325 mg by mouth daily with breakfast.   FUROSEMIDE  (LASIX) 40 MG TABLET    Take 1 tablet (40 mg total) by mouth daily.   GABAPENTIN (NEURONTIN) 300 MG CAPSULE    Take 300 mg by mouth 2 (two) times daily.   HYDRALAZINE (APRESOLINE) 25 MG TABLET    Take 1 tablet (25 mg total) by mouth every 6 (six) hours.   MIRABEGRON ER (MYRBETRIQ) 25 MG TB24 TABLET    Take 25 mg by mouth daily.   POTASSIUM CHLORIDE SA (K-DUR,KLOR-CON) 20 MEQ TABLET    Take 1 tablet (20 mEq total) by mouth daily.   QUETIAPINE (SEROQUEL) 25 MG TABLET    Take 25 mg by mouth 2 (two) times daily.    TRAMADOL (ULTRAM) 50 MG TABLET    Take 1 tablet (50 mg total) by mouth every 6 (six) hours as needed for moderate pain.   TRAZODONE (DESYREL) 150 MG TABLET    Take 150 mg by mouth at bedtime  Modified Medications   No medications on file  Discontinued Medications   GABAPENTIN (NEURONTIN) 100 MG CAPSULE    Take 200 mg by mouth 2 (two) times daily.     Allergies  Allergen Reactions  . Codeine Anaphylaxis and Nausea And Vomiting    Note: pt has tramadol for years with no reaction  . Morphine And Related Nausea And Vomiting    Severe nausea per daughter  . Valium [Diazepam] Shortness Of Breath  . Sulfa Antibiotics Itching and Rash  . Darvon [Propoxyphene] Nausea And Vomiting  . Floxin [Ofloxacin] Itching  . Levsin [Hyoscyamine Sulfate] Other (See Comments)    Unknown reaction  . Penicillins Itching    Itching reported by [facility] Has patient had a PCN reaction causing immediate rash, facial/tongue/throat swelling, SOB or lightheadedness with hypotension: Yes Has patient had a PCN reaction causing severe rash involving mucus membranes or skin necrosis: No Has patient had a PCN reaction that required hospitalization No Has patient had a PCN reaction occurring within the last 10 years: No If all of the above answers are "NO", then may proceed with Cephalosporin use.   . Zantac [Ranitidine Hcl] Other (See Comments)    Unknown reaction     REVIEW OF  SYSTEMS:  GENERAL: no change in appetite, no fatigue, no weight changes, no fever, chills or weakness EYES: Denies change in vision, dry eyes, eye pain, itching or discharge EARS: Denies change in hearing, ringing in ears, or earache NOSE: Denies nasal congestion or epistaxis MOUTH and THROAT: Denies oral discomfort, gingival pain or bleeding, pain  from teeth or hoarseness   RESPIRATORY: no cough, SOB, DOE, wheezing, hemoptysis CARDIAC: no chest pain, or palpitations GI: no abdominal pain, diarrhea, constipation, heart burn, nausea or vomiting GU: Denies dysuria, frequency, hematuria, incontinence, or discharge PSYCHIATRIC: Denies feeling of depression or anxiety. No report of hallucinations, insomnia, paranoia, or agitation    PHYSICAL EXAMINATION  GENERAL APPEARANCE: Well nourished. In no acute distress. Normal body habitus SKIN:  Skin is warm and dry.  HEAD: Normal in size and contour. No evidence of trauma EYES: Lids open and close normally. No blepharitis, entropion or ectropion. PERRL. Conjunctivae are clear and sclerae are white. Lenses are without opacity EARS: Pinnae are normal. Patient hears normal voice tunes of the examiner MOUTH and THROAT: Lips are without lesions. Oral mucosa is moist and without lesions. Tongue is normal in shape, size, and color and without lesions NECK: supple, trachea midline, no neck masses, no thyroid tenderness, no thyromegaly LYMPHATICS: no LAN in the neck, no supraclavicular LAN RESPIRATORY: breathing is even & unlabored, BS CTAB CARDIAC: RRR, no murmur,no extra heart sounds, ble 1+ edema GI: abdomen soft, normal BS, no masses, no tenderness, no hepatomegaly, no splenomegaly EXTREMITIES:  Able to move 4 extremities PSYCHIATRIC: Alert and oriented X 3. Affect and behavior are appropriate  LABS/RADIOLOGY: Labs reviewed: 03/14/16  sodium 139  K4.6  glucose 88 BUN 23 creatinine 1.38 calcium 8.4 GFR [id number] Basic Metabolic Panel:  Recent Labs   12/05/15 1838  12/07/15 0321  03/07/16 0456 03/07/16 1408 03/08/16 0639  NA  --   < > 142  < > 140 140 141  K  --   < > 3.5  < > 2.9* 3.4* 3.3*  CL  --   < > 112*  < > 109 108 111  CO2  --   < > 26  < > 23 21* 23  GLUCOSE  --   < > 105*  < > 93 125* 92  BUN  --   < > 11  < > 12 12 11   CREATININE  --   < > 0.99  < > 1.29* 1.32* 1.34*  CALCIUM  --   < > 8.5*  < > 8.2* 8.5* 8.1*  MG 2.1  --   --   --   --   --  1.9  PHOS 2.4*  --  2.6  --   --   --  3.1  < > = values in this interval not displayed. Liver Function Tests:  Recent Labs  12/22/15 1617 02/07/16 2021 03/03/16 2142  AST 15 25 28   ALT 7 17 21   ALKPHOS 68 59 64  BILITOT 0.2 0.5 0.7  PROT 6.4 6.8 6.4*  ALBUMIN 3.6 3.7 3.0*    Recent Labs  03/04/16 0444  AMMONIA 24   CBC:  Recent Labs  01/12/16 1713 02/07/16 2021  03/03/16 2142  03/06/16 0501 03/07/16 0456 03/08/16 0639  WBC 6.5 7.5  < > 11.2*  < > 10.8* 7.6 7.4  NEUTROABS 4,680 5.5  --  10.6*  --   --   --   --   HGB 10.8* 11.7*  < > 10.9*  < > 11.2* 9.9* 9.4*  HCT 35.1 37.3  < > 34.6*  < > 35.1* 31.1* 29.8*  MCV 79.8* 83.8  < > 85.6  < > 82.6 83.4 82.1  PLT 262 214  < > 168  < > 218 197 232  < > = values in this interval not displayed.  Lipid Panel:  Recent Labs  05/15/15 1015 02/08/16 0514  HDL 61.80 52   Cardiac Enzymes:  Recent Labs  02/07/16 2247  03/03/16 2204  03/04/16 0758 03/04/16 1357 03/04/16 1822  CKTOTAL 74  --  31*  --   --   --   --   TROPONINI 0.03*  < >  --   < > 0.29* 0.17* 0.12*  < > = values in this interval not displayed. CBG:  Recent Labs  03/03/16 2134 03/04/16 0203 03/04/16 0632  GLUCAP 140* 137* 210*      Dg Chest 1 View  Result Date: 03/04/2016 CLINICAL DATA:  SOB since this morning. EXAM: CHEST 1 VIEW COMPARISON:  1 day prior FINDINGS: Patient rotated to the left. Cardiomegaly accentuated by AP portable technique. Small left pleural effusion. No pneumothorax. Mild interstitial edema is new or  increased. Improved bibasilar atelectasis. Inferior right upper lobe opacity is not significantly changed and measures on the order of 3.8 cm. IMPRESSION: Development of mild interstitial edema. Persistent left pleural effusion with decreased bibasilar atelectasis. Right upper lobe rounded density is similar to on the most recent exam. Not readily apparent on 02/07/2016, arguing against a neoplastic process. Possibly an area of loculated pleural fluid. Consider further evaluation with repeat PA and lateral radiographs. Electronically Signed   By: Abigail Miyamoto M.D.   On: 03/04/2016 08:46   Ct Head Wo Contrast  Result Date: 03/03/2016 CLINICAL DATA:  Altered mental status. EXAM: CT HEAD WITHOUT CONTRAST TECHNIQUE: Contiguous axial images were obtained from the base of the skull through the vertex without intravenous contrast. COMPARISON:  CT 02/07/2016, MRI 02/08/2016 FINDINGS: Brain: No evidence of acute infarction, hemorrhage, hydrocephalus, extra-axial collection or mass lesion/mass effect. Stable degree of atrophy and chronic small vessel ischemia. Vascular: Atherosclerosis of skullbase vasculature without hyperdense vessel or abnormal calcification. Skull: Normal. Negative for fracture or focal lesion. Sinuses/Orbits: Post bilateral cataract resection. Mild chronic mucosal thickening in the paranasal sinuses. No fluid levels. Mastoid air cells are well aerated. Other: None. IMPRESSION: No acute intracranial abnormality. Electronically Signed   By: Jeb Levering M.D.   On: 03/03/2016 23:10   Ct Chest Wo Contrast  Result Date: 03/05/2016 CLINICAL DATA:  Sepsis with abnormal finding in the right lung on recent chest x-ray EXAM: CT CHEST WITHOUT CONTRAST TECHNIQUE: Multidetector CT imaging of the chest was performed following the standard protocol without IV contrast. COMPARISON:  Chest x-ray 03/04/2016. Chest x-ray 02/07/2016. Abdomen and pelvis CT 08/13/2015. FINDINGS: Cardiovascular: The heart is  enlarged. Coronary artery calcification is noted. Ascending thoracic aorta measures 4.1 cm maximum diameter. Atherosclerotic calcification is noted in the wall of the thoracic aorta. Mediastinum/Nodes: Upper normal to mildly enlarged lymph nodes are scattered through the mediastinum. 9 mm short axis precarinal lymph node is identified on image 37 series 201. 13 mm short axis subcarinal lymph nodes seen image 52. Soft tissue fullness noted in both hilar regions which may be related to atelectasis although the degree of lymphadenopathy cannot be excluded on this noncontrast exam. The esophagus has normal imaging features. Lungs/Pleura: Interlobular septal thickening is identified in the upper and lower lobes bilaterally, with a slight right-sided predominance. There is some probable asymmetric pleural-parenchymal scarring left apex with associated calcification. Comparing back to previous chest x-ray from 02/02/2016 and abdomen CT from 08/13/2015, the patient has developed new small bilateral pleural effusions and nodularity associated with the right lung fissures. 3.4 x 4.4 cm lobulated masslike lesion is identified in the right major fissure  posteriorly. This is associated with nodular opacity in the minor fissure which is contiguous. The dominant masslike component approaches water attenuation. Associated bilateral lower lobe collapse/consolidation is evident. Upper Abdomen: Unremarkable. Musculoskeletal: Bone windows reveal no worrisome lytic or sclerotic osseous lesions. IMPRESSION: 1. Findings on recent chest x-ray represent areas of low-attenuation masslike and nodular opacity involving the fissures of the right lung given the relatively rapid interval appearance, this would be unusual for neoplasm and the attenuation of this disease is lower than would be expected for soft tissue. The Nodularity is atypical for simple pleural effusion. As such, malignant involvement of the pleural surface with effusion would  be a consideration. Infection/empyema with extubated effusion in the fissure might also present with these features. 2. Bibasilar collapse/consolidation. 3. Interlobular septal thickening, right greater than left. This may be related to pulmonary edema or lymphangitic disease including neoplasm. 4. Borderline mediastinal lymphadenopathy. 5. Coronary artery and thoracic aortic atherosclerosis. Ascending thoracic aorta measures up to 4.1 cm diameter. Recommend annual imaging followup by CTA or MRA. This recommendation follows 2010 ACCF/AHA/AATS/ACR/ASA/SCA/SCAI/SIR/STS/SVM Guidelines for the Diagnosis and Management of Patients with Thoracic Aortic Disease. Circulation. 2010; [id number] Electronically Signed   By: Misty Stanley M.D.   On: 03/05/2016 07:20   Dg Chest Port 1 View  Result Date: 03/06/2016 CLINICAL DATA:  Respiratory failure. EXAM: PORTABLE CHEST 1 VIEW COMPARISON:  03/04/2016 CT of the chest on 03/05/2016. FINDINGS: Stable lenticular opacity within the right major fissure. Improved aeration of both lower lung zones. Some residual atelectasis remains in the left lower lung. No edema or pneumothorax. The heart size is stable and within normal limits. Stable mild mediastinal prominence and ectasia of the thoracic aorta. IMPRESSION: Stable opacity within the right major fissure. Improved aeration of both lower lungs. Electronically Signed   By: Aletta Edouard M.D.   On: 03/06/2016 07:48   Dg Chest Port 1 View  Result Date: 03/03/2016 CLINICAL DATA:  Altered mental status. EXAM: PORTABLE CHEST 1 VIEW COMPARISON:  Chest radiograph 02/07/2016 FINDINGS: Cardiomediastinal silhouette is enlarged. Mediastinal contours appear intact. Calcific atherosclerotic disease of the aorta noted. There is no evidence of pneumothorax. Low lung volumes with bibasilar atelectasis. Probable bilateral pleural effusions, greater on the left. Rounded density projecting over the lateral mid right thorax may represent  loculated pleural effusion versus pulmonary or pleural based mass. Osseous structures are without acute abnormality. Soft tissues are grossly normal. Breast implants are noted. IMPRESSION: Probable bilateral pleural effusions, greater on the left with bibasilar atelectasis. Rounded density projecting over the lateral mid right hemithorax may represent loculated pleural effusion versus pulmonary or pleural based mass. Cross-sectional imaging is recommended if further evaluation is clinically desired. Electronically Signed   By: Fidela Salisbury M.D.   On: 03/03/2016 22:23    ASSESSMENT/PLAN:  Generalized weakness - for home health PT, OT, CNA, ST and Nursing,  for therapeutic strengthening exercises; fall precautions  Escherichia coli UTI - had just finished Ceftin course today  Community acquired pneumonia - S/P Rocephin and azithromycin course in the hospital; no cough nor fever  Hypokalemia - continue KCl ER 20 meq 1 tab by mouth daily 03/14/16  K 4.6  Chronic congestive heart failure - no SOB; continue digoxin 0.125 mg 1 tab by mouth daily, hydralazine 25 mg 1 tab by mouth 4 times a day, carvedilol 6.25 mg 1 tab by mouth twice a day and Lasix 40 mg 1 tab by mouth daily  Abnormal CT chest findings - findings on CT test  significant for mass like right lung nodularity versus volume overload versus neoplasm versus infection and interlobular septal thickening; pulmonary team was consulted in the hospital; plan is to get a repeat CT scan of chest in one month and follow-up with pulmonary team on 04/16/16  Atrial fibrillation - rate controlled; continue diltiazem 120 mg 1 tab by mouth daily, carvedilol 6.25 mg 1 tab by mouth twice a day, digoxin 0.125 mg 1 tab by mouth daily and Coumadin 3.5 mg 1 tab by mouth daily and for INR check on 03/16/15  Chronic kidney disease, stage III - stable Lab Results  Component Value Date   CREATININE 1.34 (H) 03/08/2016   Hyperlipidemia - continue Lipitor 20 mg  2 tabs = 40 mg by mouth daily Lab Results  Component Value Date   CHOL 172 02/08/2016   HDL 52 02/08/2016   LDLCALC 106 (H) 02/08/2016   TRIG 68 02/08/2016   CHOLHDL 3.3 02/08/2016     Anemia of chronic disease - continue ferrous sulfate 325 mg 1 tab by mouth daily Lab Results  Component Value Date   HGB 9.8 (A) 03/14/2016   Bipolar disorder - continue Lexapro 10 mg 1 tab by mouth daily and Quetiapine 25 mg 1 tab by mouth twice a day   Chronic back pain - continue tramadol 50 mg 1 tab by mouth every 6 hours when necessary, Tylenol 325 mg 2 tabs and g = 650 mg by mouth every 6 hours when necessary abapentin 300 mg 1 capsule by mouth twice a day   Overactive bladder - continue Myrbetriq ER 25 mg 1 tab by mouth daily  Cognitive decline - for ST for safety awareness and participation in functional tasks      I have filled out patient's discharge paperwork and written prescriptions.  Patient will receive home health PT, OT, ST, Nursing and CNA.  DME provided:  None  Total discharge time: Greater than 30 minutes Greater than 50% was spent in counseling and coordination of care with the patient.  Discharge time involved coordination of the discharge process with social worker, nursing staff and therapy department. Medical justification for home health services verified.   Monina C. Cove - NP  [employer] [phone]

## 2016-03-15 ENCOUNTER — Inpatient Hospital Stay: Admission: RE | Admit: 2016-03-15 | Payer: Medicare Other | Source: Ambulatory Visit

## 2016-03-18 ENCOUNTER — Ambulatory Visit: Payer: Medicare Other

## 2016-03-18 ENCOUNTER — Ambulatory Visit: Payer: Medicare Other | Admitting: Internal Medicine

## 2016-03-18 ENCOUNTER — Telehealth: Payer: Self-pay | Admitting: Internal Medicine

## 2016-03-18 NOTE — Telephone Encounter (Signed)
Pt missed appointment today. Called to see if pt is ok. Spoke with pt's daughter, daughter states that she forgot about her mother's appointment. Daughter states that she will call office later this week to reschedule pt's appointment.

## 2016-03-21 NOTE — Progress Notes (Deleted)
Tracy Rodriguez was seen today in the movement disorders clinic for neurologic consultation at the request of Dr. [place] Pink.  Her PCP is [name], MD.  The consultation is for the evaluation of tremor and to r/o PD as well as memory change.  This patient is accompanied in the office by her {companion:[id number]} who supplements the history.  Pts tremor has been going on for *** and involves the ***.  She is on seroquel and has been on that at least since 2014 for bipolar depression.  The first symptom(s) the patient noticed was {parkinsons general sx:18033} and this was {NUMBERS;0-15 BY 1:408015} {days/wks/mos/yrs:310907}.    Specific Symptoms:  Tremor: {yes no:314532} Family hx of similar:  {yes no:314532} Voice: *** Sleep: ***  Vivid Dreams:  {yes no:314532}  Acting out dreams:  {yes no:314532} Wet Pillows: {yes no:314532} Postural symptoms:  {yes no:314532}  Falls?  {yes no:314532} Bradykinesia symptoms: {parkinson brady:18041} Loss of smell:  {yes no:314532} Loss of taste:  {yes no:314532} Urinary Incontinence:  {yes no:314532} Difficulty Swallowing:  {yes no:314532} Handwriting, micrographia: {yes no:314532} Trouble with ADL's:  {yes no:314532}  Trouble buttoning clothing: {yes no:314532} Depression:  {yes no:314532} Memory changes:  {yes no:314532} Hallucinations:  {yes no:314532}  visual distortions: {yes no:314532} N/V:  {yes no:314532} Lightheaded:  {yes no:314532}  Syncope: {yes no:314532} Diplopia:  {yes no:314532} Dyskinesia:  {yes no:314532}  Neuroimaging has *** previously been performed. The patient had a CT of the brain on 03/03/2016.  I reviewed this.  It was unremarkable.  PREVIOUS MEDICATIONS: {Parkinson's RX:18200}  ALLERGIES:   Allergies  Allergen Reactions  . Codeine Anaphylaxis and Nausea And Vomiting    Note: pt has tramadol for years with no reaction  . Morphine And Related Nausea And Vomiting    Severe nausea per daughter  . Valium  [Diazepam] Shortness Of Breath  . Sulfa Antibiotics Itching and Rash  . Darvon [Propoxyphene] Nausea And Vomiting  . Floxin [Ofloxacin] Itching  . Levsin [Hyoscyamine Sulfate] Other (See Comments)    Unknown reaction  . Penicillins Itching    Itching reported by [facility] Has patient had a PCN reaction causing immediate rash, facial/tongue/throat swelling, SOB or lightheadedness with hypotension: Yes Has patient had a PCN reaction causing severe rash involving mucus membranes or skin necrosis: No Has patient had a PCN reaction that required hospitalization No Has patient had a PCN reaction occurring within the last 10 years: No If all of the above answers are "NO", then may proceed with Cephalosporin use.   . Zantac [Ranitidine Hcl] Other (See Comments)    Unknown reaction    CURRENT MEDICATIONS:  Outpatient Encounter Prescriptions as of 03/25/2016  Medication Sig  . acetaminophen (TYLENOL) 325 MG tablet Take 2 tablets (650 mg total) by mouth every 6 (six) hours as needed for mild pain (or Fever >/= 101).  Marland Kitchen atorvastatin (LIPITOR) 20 MG tablet Take 2 tablets (40 mg total) by mouth daily.  . carvedilol (COREG) 6.25 MG tablet Take 1 tablet (6.25 mg total) by mouth 2 (two) times daily with a meal.  . Dextromethorphan Polistirex (DELSYM PO) Take 10 mLs by mouth every 12 (twelve) hours as needed.  . digoxin (LANOXIN) 0.125 MG tablet Take 0.125 mg by mouth daily.  Marland Kitchen diltiazem (DILACOR XR) 120 MG 24 hr capsule Take 120 mg by mouth daily.  . Docosanol (ABREVA) 10 % CREA Apply 1 application topically 4 (four) times daily. Apply to upper lip  . escitalopram (LEXAPRO) 10 MG  tablet TAKE 1 TABLET(10 MG) BY MOUTH DAILY  . ferrous sulfate 325 (65 FE) MG tablet Take 325 mg by mouth daily with breakfast.  . furosemide (LASIX) 40 MG tablet Take 1 tablet (40 mg total) by mouth daily.  Marland Kitchen gabapentin (NEURONTIN) 300 MG capsule Take 300 mg by mouth 2 (two) times daily.  . hydrALAZINE  (APRESOLINE) 25 MG tablet Take 1 tablet (25 mg total) by mouth every 6 (six) hours.  . mirabegron ER (MYRBETRIQ) 25 MG TB24 tablet Take 25 mg by mouth daily.  . potassium chloride SA (K-DUR,KLOR-CON) 20 MEQ tablet Take 1 tablet (20 mEq total) by mouth daily.  . QUEtiapine (SEROQUEL) 25 MG tablet Take 25 mg by mouth 2 (two) times daily.   . traMADol (ULTRAM) 50 MG tablet Take 1 tablet (50 mg total) by mouth every 6 (six) hours as needed for moderate pain.  . traZODone (DESYREL) 150 MG tablet Take 150 mg by mouth at bedtime   No facility-administered encounter medications on file as of 03/25/2016.     PAST MEDICAL HISTORY:   Past Medical History:  Diagnosis Date  . Arthritis    "joints" (08/04/2014)  . Bipolar 1 disorder ([place])   . Cancer of right breast ([place])   . Chronic lower back pain   . Hyperlipemia   . Hypertension   . PAF (paroxysmal atrial fibrillation) ([place])     PAST SURGICAL HISTORY:   Past Surgical History:  Procedure Laterality Date  . BACK SURGERY    . BREAST BIOPSY Right   . BREAST RECONSTRUCTION Right   . CATARACT EXTRACTION W/ INTRAOCULAR LENS  IMPLANT, BILATERAL Bilateral   . EXCISIONAL HEMORRHOIDECTOMY    . [place]; 1973; 1985   "ruptured discs each time"  . MASTECTOMY Right    cancer  . PLACEMENT OF BREAST IMPLANTS Bilateral    "had to take tissue out of left"  . TONSILLECTOMY    . VAGINAL HYSTERECTOMY      SOCIAL HISTORY:   Social History   Social History  . Marital status: Widowed    Spouse name: N/A  . Number of children: N/A  . Years of education: N/A   Occupational History  . Not on file.   Social History Main Topics  . Smoking status: Never Smoker  . Smokeless tobacco: Never Used  . Alcohol use No  . Drug use: No  . Sexual activity: No   Other Topics Concern  . Not on file   Social History Narrative  . No narrative on file    FAMILY HISTORY:   Family Status  Relation Status  . Mother Deceased  . Father  Deceased  . Sister Alive  . Brother Deceased  . Maternal Grandmother Deceased  . Maternal Grandfather Deceased  . Paternal Grandmother Deceased  . Paternal Grandfather Deceased  . Sister Deceased  . Daughter     ROS:  A complete 10 system review of systems was obtained and was unremarkable apart from what is mentioned above.  PHYSICAL EXAMINATION:    VITALS:  There were no vitals filed for this visit.  GEN:  The patient appears stated age and is in NAD. HEENT:  Normocephalic, atraumatic.  The mucous membranes are moist. The superficial temporal arteries are without ropiness or tenderness. CV:  RRR Lungs:  CTAB Neck/HEME:  There are no carotid bruits bilaterally.  Neurological examination:  Orientation: The patient is alert and oriented x3. Fund of knowledge is appropriate.  Recent and remote  memory are intact.  Attention and concentration are normal.    Able to name objects and repeat phrases. Cranial nerves: There is good facial symmetry. Pupils are equal round and reactive to light bilaterally. Fundoscopic exam reveals clear margins bilaterally. Extraocular muscles are intact. The visual fields are full to confrontational testing. The speech is fluent and clear. Soft palate rises symmetrically and there is no tongue deviation. Hearing is intact to conversational tone. Sensation: Sensation is intact to light and pinprick throughout (facial, trunk, extremities). Vibration is intact at the bilateral big toe. There is no extinction with double simultaneous stimulation. There is no sensory dermatomal level identified. Motor: Strength is 5/5 in the bilateral upper and lower extremities.   Shoulder shrug is equal and symmetric.  There is no pronator drift. Deep tendon reflexes: Deep tendon reflexes are 2/4 at the bilateral biceps, triceps, brachioradialis, patella and achilles. Plantar responses are downgoing bilaterally.  Movement examination: Tone: There is ***tone in the bilateral upper  extremities.  The tone in the lower extremities is ***.  Abnormal movements: *** Coordination:  There is *** decremation with RAM's, *** Gait and Station: The patient has *** difficulty arising out of a deep-seated chair without the use of the hands. The patient's stride length is ***.  The patient has a *** pull test.      ASSESSMENT/PLAN:  ***  Cc:  [name], MD

## 2016-03-22 ENCOUNTER — Ambulatory Visit (INDEPENDENT_AMBULATORY_CARE_PROVIDER_SITE_OTHER): Payer: Medicare Other | Admitting: Family Medicine

## 2016-03-22 ENCOUNTER — Telehealth: Payer: Self-pay | Admitting: Internal Medicine

## 2016-03-22 ENCOUNTER — Encounter: Payer: Self-pay | Admitting: Family Medicine

## 2016-03-22 VITALS — BP 116/48 | HR 57 | Temp 97.1°F | Wt 168.8 lb

## 2016-03-22 DIAGNOSIS — I130 Hypertensive heart and chronic kidney disease with heart failure and stage 1 through stage 4 chronic kidney disease, or unspecified chronic kidney disease: Secondary | ICD-10-CM | POA: Diagnosis not present

## 2016-03-22 DIAGNOSIS — R300 Dysuria: Secondary | ICD-10-CM

## 2016-03-22 DIAGNOSIS — Z2239 Carrier of other specified bacterial diseases: Secondary | ICD-10-CM

## 2016-03-22 DIAGNOSIS — N183 Chronic kidney disease, stage 3 (moderate): Secondary | ICD-10-CM | POA: Diagnosis not present

## 2016-03-22 DIAGNOSIS — Z8701 Personal history of pneumonia (recurrent): Secondary | ICD-10-CM | POA: Diagnosis not present

## 2016-03-22 DIAGNOSIS — I480 Paroxysmal atrial fibrillation: Secondary | ICD-10-CM | POA: Diagnosis not present

## 2016-03-22 DIAGNOSIS — I5032 Chronic diastolic (congestive) heart failure: Secondary | ICD-10-CM | POA: Diagnosis not present

## 2016-03-22 DIAGNOSIS — Z87440 Personal history of urinary (tract) infections: Secondary | ICD-10-CM | POA: Diagnosis not present

## 2016-03-22 LAB — POCT URINALYSIS DIPSTICK
Bilirubin, UA: NEGATIVE
Blood, UA: NEGATIVE
Glucose, UA: NEGATIVE
Ketones, UA: NEGATIVE
Nitrite, UA: NEGATIVE
Protein, UA: NEGATIVE
Spec Grav, UA: 1.01
Urobilinogen, UA: 0.2
pH, UA: 6

## 2016-03-22 MED ORDER — CEFUROXIME AXETIL 250 MG PO TABS: 250.0000 mg | ORAL_TABLET | Freq: Two times a day (BID) | ORAL | 0 refills | Status: DC

## 2016-03-22 NOTE — Telephone Encounter (Signed)
Is it ok for me call orders in? Please advise.

## 2016-03-22 NOTE — Telephone Encounter (Signed)
okay

## 2016-03-22 NOTE — Telephone Encounter (Signed)
[name] with [facility] need verbal orders for nursing for disease management 2 for 5 weeks 1 for 3 weeks with 2 PRN

## 2016-03-22 NOTE — Telephone Encounter (Signed)
Spoke with [name] from [facility] and verbal orders for nursing for disease management 2 for 5 weeks 1 for 3 weeks with 2 PRN were given per Dr.K

## 2016-03-22 NOTE — Progress Notes (Signed)
Pre visit review using our clinic review tool, if applicable. No additional management support is needed unless otherwise documented below in the visit note. 

## 2016-03-22 NOTE — Progress Notes (Signed)
History of Present Illness:    Tracy Rodriguez is a 83 y.o. female who complains of burning with urination for 2 days. Patient denies back or flank pain, fever, chills, N/V, vaginal discharge, and abdominal pain. Patient does have a history of recurrent UTI. Patient does have a history of pyelonephritis - recent hospitalization for urosepsis (e.coli, pan-sensensitive, treated with and tolerated Ceftin).   PMHx, SurgHx, SocialHx, Medications, and Allergies were reviewed in the Visit Navigator and updated as appropriate.   Past Medical History:   Past Medical History:  Diagnosis Date  . Arthritis    "joints" (08/04/2014)  . Bipolar 1 disorder ([place])   . Cancer of right breast ([place])   . Chronic lower back pain   . Hyperlipemia   . Hypertension   . PAF (paroxysmal atrial fibrillation) ([place])      Past Surgical History:   Past Surgical History:  Procedure Laterality Date  . BACK SURGERY    . BREAST BIOPSY Right   . BREAST RECONSTRUCTION Right   . CATARACT EXTRACTION W/ INTRAOCULAR LENS  IMPLANT, BILATERAL Bilateral   . EXCISIONAL HEMORRHOIDECTOMY    . [place]; 1973; 1985   "ruptured discs each time"  . MASTECTOMY Right    cancer  . PLACEMENT OF BREAST IMPLANTS Bilateral    "had to take tissue out of left"  . TONSILLECTOMY    . VAGINAL HYSTERECTOMY       Allergies:   Allergies  Allergen Reactions  . Codeine Anaphylaxis and Nausea And Vomiting    Note: pt has tramadol for years with no reaction  . Morphine And Related Nausea And Vomiting    Severe nausea per daughter  . Valium [Diazepam] Shortness Of Breath  . Sulfa Antibiotics Itching and Rash  . Darvon [Propoxyphene] Nausea And Vomiting  . Floxin [Ofloxacin] Itching  . Levsin [Hyoscyamine Sulfate] Other (See Comments)    Unknown reaction  . Penicillins Itching    Itching reported by [facility] Has patient had a PCN reaction causing immediate rash, facial/tongue/throat  swelling, SOB or lightheadedness with hypotension: Yes Has patient had a PCN reaction causing severe rash involving mucus membranes or skin necrosis: No Has patient had a PCN reaction that required hospitalization No Has patient had a PCN reaction occurring within the last 10 years: No If all of the above answers are "NO", then may proceed with Cephalosporin use.   . Zantac [Ranitidine Hcl] Other (See Comments)    Unknown reaction     Current Medications:   Prior to Admission medications   Medication Sig Start Date End Date Taking? Authorizing Provider  acetaminophen (TYLENOL) 325 MG tablet Take 2 tablets (650 mg total) by mouth every 6 (six) hours as needed for mild pain (or Fever >/= 101). 02/12/16   Bonnielee Haff, MD  atorvastatin (LIPITOR) 20 MG tablet Take 2 tablets (40 mg total) by mouth daily. 02/12/16   Bonnielee Haff, MD  carvedilol (COREG) 6.25 MG tablet Take 1 tablet (6.25 mg total) by mouth 2 (two) times daily with a meal. 02/12/16   Bonnielee Haff, MD  Dextromethorphan Polistirex (DELSYM PO) Take 10 mLs by mouth every 12 (twelve) hours as needed.    Historical Provider, MD  digoxin (LANOXIN) 0.125 MG tablet Take 0.125 mg by mouth daily.    Historical Provider, MD  diltiazem (DILACOR XR) 120 MG 24 hr capsule Take 120 mg by mouth daily.    Historical Provider, MD  Docosanol (ABREVA) 10 %  CREA Apply 1 application topically 4 (four) times daily. Apply to upper lip    Historical Provider, MD  escitalopram (LEXAPRO) 10 MG tablet TAKE 1 TABLET(10 MG) BY MOUTH DAILY 02/08/16   Marletta Lor, MD  ferrous sulfate 325 (65 FE) MG tablet Take 325 mg by mouth daily with breakfast.    Historical Provider, MD  furosemide (LASIX) 40 MG tablet Take 1 tablet (40 mg total) by mouth daily. 03/08/16   Silver Huguenin Elgergawy, MD  gabapentin (NEURONTIN) 300 MG capsule Take 300 mg by mouth 2 (two) times daily.    Historical Provider, MD  hydrALAZINE (APRESOLINE) 25 MG tablet Take 1 tablet (25 mg total)  by mouth every 6 (six) hours. 03/08/16   Silver Huguenin Elgergawy, MD  mirabegron ER (MYRBETRIQ) 25 MG TB24 tablet Take 25 mg by mouth daily.    Historical Provider, MD  potassium chloride SA (K-DUR,KLOR-CON) 20 MEQ tablet Take 1 tablet (20 mEq total) by mouth daily. 02/12/16   Bonnielee Haff, MD  QUEtiapine (SEROQUEL) 25 MG tablet Take 25 mg by mouth 2 (two) times daily.  02/15/16   Historical Provider, MD  traMADol (ULTRAM) 50 MG tablet Take 1 tablet (50 mg total) by mouth every 6 (six) hours as needed for moderate pain. 03/09/16   Bonnielee Haff, MD  traZODone (DESYREL) 150 MG tablet Take 150 mg by mouth at bedtime 03/09/16   Bonnielee Haff, MD    Family History:   Family History  Problem Relation Age of Onset  . Stroke Mother   . Heart disease Father   . Alzheimer's disease Sister   . Leukemia Brother   . Arthritis Maternal Grandmother   . Breast cancer Daughter      Social History:   Social History   Social History  . Marital status: Widowed    Spouse name: N/A  . Number of children: N/A  . Years of education: N/A   Occupational History  . Not on file.   Social History Main Topics  . Smoking status: Never Smoker  . Smokeless tobacco: Never Used  . Alcohol use No  . Drug use: No  . Sexual activity: No   Other Topics Concern  . Not on file   Social History Narrative  . No narrative on file     Review of Systems:   No unusual headaches, no dizziness. No prolonged cough. No dyspnea or chest pain on exertion. No abdominal pain, change in bowel habits, black or bloody stools. No new or unusual musculoskeletal symptoms. No edema.  Vitals:   Vitals:   03/22/16 1526  BP: (!) 116/48  Pulse: (!) 57  Temp: 97.1 F (36.2 C)  TempSrc: Oral  SpO2: 93%  Weight: 168 lb 12.8 oz (76.6 kg)     Body mass index is 24.93 kg/m.   Physical Exam:    General: alert and cooperative  Abdomen: soft, non-tender, without masses or organomegaly in the entire abdomen  Back: CVA  tenderness absent  GU: defer exam    Laboratory:  Results for orders placed or performed in visit on 03/22/16  POCT urinalysis dipstick  Result Value Ref Range   Color, UA yellow    Clarity, UA clear    Glucose, UA neg    Bilirubin, UA neg    Ketones, UA neg    Spec Grav, UA 1.010    Blood, UA neg    pH, UA 6.0    Protein, UA neg    Urobilinogen, UA 0.2  Nitrite, UA neg    Leukocytes, UA small (1+) (A) Negative     Assessment and Plan:    Danielly was seen today for dysuria.  Diagnoses and all orders for this visit:  Dysuria -     cefUROXime (CEFTIN) 250 MG tablet; Take 1 tablet (250 mg total) by mouth 2 (two) times daily with a meal. -     POCT urinalysis dipstick -     Urine culture  . Reviewed expectations re: course of current medical issues. . Discussed self-management of symptoms. . Outlined signs and symptoms indicating need for more acute intervention. . Patient verbalized understanding and all questions were answered. . See orders for this visit as documented in the electronic medical record. . Patient received an After Visit Summary.   Briscoe Deutscher, DO

## 2016-03-25 ENCOUNTER — Ambulatory Visit (INDEPENDENT_AMBULATORY_CARE_PROVIDER_SITE_OTHER): Payer: Medicare Other | Admitting: General Practice

## 2016-03-25 ENCOUNTER — Encounter: Payer: Self-pay | Admitting: Family Medicine

## 2016-03-25 ENCOUNTER — Ambulatory Visit (INDEPENDENT_AMBULATORY_CARE_PROVIDER_SITE_OTHER): Payer: Medicare Other | Admitting: Family Medicine

## 2016-03-25 ENCOUNTER — Ambulatory Visit: Payer: Medicare Other | Admitting: Neurology

## 2016-03-25 ENCOUNTER — Telehealth: Payer: Self-pay | Admitting: Internal Medicine

## 2016-03-25 VITALS — BP 112/58 | HR 51 | Temp 98.2°F | Ht 69.0 in | Wt 166.0 lb

## 2016-03-25 DIAGNOSIS — Z5181 Encounter for therapeutic drug level monitoring: Secondary | ICD-10-CM

## 2016-03-25 DIAGNOSIS — Z87440 Personal history of urinary (tract) infections: Secondary | ICD-10-CM | POA: Diagnosis not present

## 2016-03-25 DIAGNOSIS — N183 Chronic kidney disease, stage 3 (moderate): Secondary | ICD-10-CM | POA: Diagnosis not present

## 2016-03-25 DIAGNOSIS — I480 Paroxysmal atrial fibrillation: Secondary | ICD-10-CM | POA: Diagnosis not present

## 2016-03-25 DIAGNOSIS — I5032 Chronic diastolic (congestive) heart failure: Secondary | ICD-10-CM | POA: Diagnosis not present

## 2016-03-25 DIAGNOSIS — Z8701 Personal history of pneumonia (recurrent): Secondary | ICD-10-CM | POA: Diagnosis not present

## 2016-03-25 DIAGNOSIS — I4891 Unspecified atrial fibrillation: Secondary | ICD-10-CM

## 2016-03-25 DIAGNOSIS — K5900 Constipation, unspecified: Secondary | ICD-10-CM | POA: Diagnosis not present

## 2016-03-25 DIAGNOSIS — I130 Hypertensive heart and chronic kidney disease with heart failure and stage 1 through stage 4 chronic kidney disease, or unspecified chronic kidney disease: Secondary | ICD-10-CM | POA: Diagnosis not present

## 2016-03-25 LAB — POCT INR: INR: 1.3

## 2016-03-25 MED ORDER — METAMUCIL PO WAFR: 2.0000 | WAFER | Freq: Every day | ORAL | 6 refills | Status: DC

## 2016-03-25 MED ORDER — POLYETHYLENE GLYCOL 3350 17 GM/SCOOP PO POWD: 17.0000 g | Freq: Every day | ORAL | 1 refills | Status: DC

## 2016-03-25 NOTE — Patient Instructions (Signed)
Pre visit review using our clinic review tool, if applicable. No additional management support is needed unless otherwise documented below in the visit note. 

## 2016-03-25 NOTE — Telephone Encounter (Signed)
See message below - please advise

## 2016-03-25 NOTE — Progress Notes (Signed)
Alvenia Varella is a 83 y.o. female here for a new problem.  History of Present Illness:   Constipation, x 5 days. No abdominal pain. Passing gas. Denies N/V. Started treatment for UTI last week. No Hx of chronic constipation. Moved to ALF recently. Caregiver is daughter. Otherwise, denies other complaints today. Feels that she is improving with treatment for the UTI.  PMHx, SurgHx, SocialHx, Medications, and Allergies were reviewed in the Visit Navigator and updated as appropriate.  Current Medications:   Current Outpatient Prescriptions:  .  acetaminophen (TYLENOL) 325 MG tablet, Take 2 tablets (650 mg total) by mouth every 6 (six) hours as needed for mild pain (or Fever >/= 101)., Disp: , Rfl:  .  atorvastatin (LIPITOR) 20 MG tablet, Take 2 tablets (40 mg total) by mouth daily., Disp: 90 tablet, Rfl: 3 .  carvedilol (COREG) 6.25 MG tablet, Take 1 tablet (6.25 mg total) by mouth 2 (two) times daily with a meal., Disp: , Rfl:  .  cefUROXime (CEFTIN) 250 MG tablet, Take 1 tablet (250 mg total) by mouth 2 (two) times daily with a meal., Disp: 20 tablet, Rfl: 0 .  Dextromethorphan Polistirex (DELSYM PO), Take 10 mLs by mouth every 12 (twelve) hours as needed., Disp: , Rfl:  .  digoxin (LANOXIN) 0.125 MG tablet, Take 0.125 mg by mouth daily., Disp: , Rfl:  .  diltiazem (DILACOR XR) 120 MG 24 hr capsule, Take 120 mg by mouth daily., Disp: , Rfl:  .  Docosanol (ABREVA) 10 % CREA, Apply 1 application topically 4 (four) times daily. Apply to upper lip, Disp: , Rfl:  .  escitalopram (LEXAPRO) 10 MG tablet, TAKE 1 TABLET(10 MG) BY MOUTH DAILY, Disp: 90 tablet, Rfl: 1 .  ferrous sulfate 325 (65 FE) MG tablet, Take 325 mg by mouth daily with breakfast., Disp: , Rfl:  .  furosemide (LASIX) 40 MG tablet, Take 1 tablet (40 mg total) by mouth daily., Disp: , Rfl:  .  gabapentin (NEURONTIN) 300 MG capsule, Take 300 mg by mouth 2 (two) times daily., Disp: , Rfl:  .  hydrALAZINE (APRESOLINE) 25 MG tablet, Take 1  tablet (25 mg total) by mouth every 6 (six) hours., Disp: , Rfl:  .  mirabegron ER (MYRBETRIQ) 25 MG TB24 tablet, Take 25 mg by mouth daily., Disp: , Rfl:  .  potassium chloride SA (K-DUR,KLOR-CON) 20 MEQ tablet, Take 1 tablet (20 mEq total) by mouth daily., Disp: , Rfl:  .  QUEtiapine (SEROQUEL) 25 MG tablet, Take 25 mg by mouth 2 (two) times daily. , Disp: , Rfl:  .  traMADol (ULTRAM) 50 MG tablet, Take 1 tablet (50 mg total) by mouth every 6 (six) hours as needed for moderate pain., Disp: 20 tablet, Rfl: 0 .  traZODone (DESYREL) 150 MG tablet, Take 150 mg by mouth at bedtime, Disp: 30 tablet, Rfl: 0  Review of Systems:   Constitutional: Negative for fever, chills. HEENT: Negative for ear discharge, ear pain, mouth sores, sore throat, tinnitus and trouble swallowing.  No eye pain. LUNGS: Negative for cough, choking, chest tightness, shortness of breath and wheezing.   CV: Negative for chest pain, palpitations and leg swelling.  GI: Negative for nausea, abdominal pain, diarrhea. No new change in bowel habits. GU: Negative for dysuria, flank pain and difficulty urinating.  MSK:: Negative for unusual  joint swelling or pains, gait problem, neck pain and neck stiffness.  NEURO: Negative for dizziness, tremors, speech difficulty, weakness and headaches. No visual changes.  Vitals:  Vitals:   03/25/16 1432  BP: (!) 112/58  Pulse: (!) 51  Temp: 98.2 F (36.8 C)  TempSrc: Oral  SpO2: 97%  Weight: 166 lb (75.3 kg)  Height: 5\' 9"  (1.753 m)     Body mass index is 24.51 kg/m.  Physical Exam:   General appearance: alert, cooperative and no distress HEENT: Pharynx: Dental Hygiene adequate. Normal buccal mucosa. Normal pharynx. Resp: clear to auscultation bilaterally Cardio: regular rate and rhythm GI: normal findings: bowel sounds normal, no masses palpable and no organomegaly, soft and without any tenderness to palpation Extremities: extremities normal, atraumatic, no cyanosis or  edema   Assessment and Plan:    [place] was seen today for acute visit.  Diagnoses and all orders for this visit:  Constipation, without red flags -     polyethylene glycol powder (GLYCOLAX/MIRALAX) powder; Take 17 g by mouth daily. -     Psyllium (METAMUCIL) WAFR; Take 2 Wafers by mouth daily prn constipation. -     Hydration encouraged. - Will call and review with the patient's daughter.   . Reviewed expectations re: course of current medical issues. . Discussed self-management of symptoms. . Outlined signs and symptoms indicating need for more acute intervention. . Patient verbalized understanding and all questions were answered. . See orders for this visit as documented in the electronic medical record. . Patient received an After-Visit Summary.   Briscoe Deutscher, D.O.

## 2016-03-25 NOTE — Telephone Encounter (Signed)
° ° °  Mikle Bosworth from [facility] call to say they need a stop date fro the below med   cefUROXime (CEFTIN) 250 MG tablet    [phone]

## 2016-03-25 NOTE — Telephone Encounter (Signed)
Rx stating Discontinue after 7 days of therapy was faxed to [place] from [facility] @ [phone]

## 2016-03-25 NOTE — Telephone Encounter (Signed)
Spoke with Mikle Bosworth from [facility] and informed her to D/C Medication after 7 days of therapy. Mikle Bosworth requested an Rx stating this information faxed to [phone].

## 2016-03-25 NOTE — Telephone Encounter (Signed)
Discontinue after 7 days of therapy

## 2016-03-26 ENCOUNTER — Telehealth: Payer: Self-pay | Admitting: Internal Medicine

## 2016-03-26 DIAGNOSIS — I480 Paroxysmal atrial fibrillation: Secondary | ICD-10-CM | POA: Diagnosis not present

## 2016-03-26 DIAGNOSIS — Z8701 Personal history of pneumonia (recurrent): Secondary | ICD-10-CM | POA: Diagnosis not present

## 2016-03-26 DIAGNOSIS — I130 Hypertensive heart and chronic kidney disease with heart failure and stage 1 through stage 4 chronic kidney disease, or unspecified chronic kidney disease: Secondary | ICD-10-CM | POA: Diagnosis not present

## 2016-03-26 DIAGNOSIS — N183 Chronic kidney disease, stage 3 (moderate): Secondary | ICD-10-CM | POA: Diagnosis not present

## 2016-03-26 DIAGNOSIS — Z87440 Personal history of urinary (tract) infections: Secondary | ICD-10-CM | POA: Diagnosis not present

## 2016-03-26 DIAGNOSIS — I5032 Chronic diastolic (congestive) heart failure: Secondary | ICD-10-CM | POA: Diagnosis not present

## 2016-03-26 NOTE — Telephone Encounter (Signed)
Spoke with Jim,OT from [facility], verbal orders for Occupational Therapy were given per Dr. Raliegh Ip

## 2016-03-26 NOTE — Telephone Encounter (Signed)
° ° ° ° °  Tracy Rodriguez a speech therapy with [facility] call to say her speech therapy eval was completed and no  future speech therapy is needed at this time    [phone]

## 2016-03-26 NOTE — Telephone Encounter (Signed)
Tracy Rodriguez would like verbal order for OT

## 2016-03-26 NOTE — Telephone Encounter (Signed)
Okay for verbal order for OT

## 2016-03-26 NOTE — Telephone Encounter (Signed)
Ok to give verbal orders? Please advise  

## 2016-03-29 ENCOUNTER — Telehealth: Payer: Self-pay | Admitting: Internal Medicine

## 2016-03-29 DIAGNOSIS — I480 Paroxysmal atrial fibrillation: Secondary | ICD-10-CM | POA: Diagnosis not present

## 2016-03-29 DIAGNOSIS — N183 Chronic kidney disease, stage 3 (moderate): Secondary | ICD-10-CM | POA: Diagnosis not present

## 2016-03-29 DIAGNOSIS — I130 Hypertensive heart and chronic kidney disease with heart failure and stage 1 through stage 4 chronic kidney disease, or unspecified chronic kidney disease: Secondary | ICD-10-CM | POA: Diagnosis not present

## 2016-03-29 DIAGNOSIS — Z2239 Carrier of other specified bacterial diseases: Secondary | ICD-10-CM | POA: Insufficient documentation

## 2016-03-29 DIAGNOSIS — Z87440 Personal history of urinary (tract) infections: Secondary | ICD-10-CM | POA: Diagnosis not present

## 2016-03-29 DIAGNOSIS — I5032 Chronic diastolic (congestive) heart failure: Secondary | ICD-10-CM | POA: Diagnosis not present

## 2016-03-29 DIAGNOSIS — Z8701 Personal history of pneumonia (recurrent): Secondary | ICD-10-CM | POA: Diagnosis not present

## 2016-03-29 LAB — URINE CULTURE

## 2016-03-29 MED ORDER — NITROFURANTOIN MONOHYD MACRO 100 MG PO CAPS: 100.0000 mg | ORAL_CAPSULE | Freq: Two times a day (BID) | ORAL | 0 refills | Status: DC

## 2016-03-29 NOTE — Telephone Encounter (Signed)
PT need verbal orders she has started pts therapy this week and would like to continue to see the pt for 2 weeks for the next 2 months to work on balance and functional strengthen.

## 2016-03-29 NOTE — Addendum Note (Signed)
Addended by: Briscoe Deutscher R on: 03/29/2016 02:59 PM   Modules accepted: Orders

## 2016-03-29 NOTE — Telephone Encounter (Signed)
See message below, please advise.

## 2016-03-30 DIAGNOSIS — I5032 Chronic diastolic (congestive) heart failure: Secondary | ICD-10-CM | POA: Diagnosis not present

## 2016-03-30 DIAGNOSIS — Z87440 Personal history of urinary (tract) infections: Secondary | ICD-10-CM | POA: Diagnosis not present

## 2016-03-30 DIAGNOSIS — Z8701 Personal history of pneumonia (recurrent): Secondary | ICD-10-CM | POA: Diagnosis not present

## 2016-03-30 DIAGNOSIS — I130 Hypertensive heart and chronic kidney disease with heart failure and stage 1 through stage 4 chronic kidney disease, or unspecified chronic kidney disease: Secondary | ICD-10-CM | POA: Diagnosis not present

## 2016-03-30 DIAGNOSIS — I480 Paroxysmal atrial fibrillation: Secondary | ICD-10-CM | POA: Diagnosis not present

## 2016-03-30 DIAGNOSIS — N183 Chronic kidney disease, stage 3 (moderate): Secondary | ICD-10-CM | POA: Diagnosis not present

## 2016-04-01 NOTE — Progress Notes (Signed)
Patient has moved to [facility] and the daughter is requesting that [name]  To direct all medication  prescriptions to the facility not unless she comes to the clinic and is able to pick it up.04/01/2016. By Rosealee Albee CMA.

## 2016-04-01 NOTE — Telephone Encounter (Signed)
Okay to continue PT.

## 2016-04-01 NOTE — Progress Notes (Signed)
Patient is no longer living home. She is at [facility]. The daughter requests that for future Prescription be sent to the facility, only if the patient is seen at the clinic and a prescription is given to her mother.

## 2016-04-01 NOTE — Telephone Encounter (Signed)
Spoke with Michelle,PT from [facility] verbal orders given for physical therapy for 2 weeks for the next 2 months to work on balance and functional strengthen per Dr. Raliegh Ip

## 2016-04-02 DIAGNOSIS — N183 Chronic kidney disease, stage 3 (moderate): Secondary | ICD-10-CM | POA: Diagnosis not present

## 2016-04-02 DIAGNOSIS — Z87440 Personal history of urinary (tract) infections: Secondary | ICD-10-CM | POA: Diagnosis not present

## 2016-04-02 DIAGNOSIS — Z8701 Personal history of pneumonia (recurrent): Secondary | ICD-10-CM | POA: Diagnosis not present

## 2016-04-02 DIAGNOSIS — I130 Hypertensive heart and chronic kidney disease with heart failure and stage 1 through stage 4 chronic kidney disease, or unspecified chronic kidney disease: Secondary | ICD-10-CM | POA: Diagnosis not present

## 2016-04-02 DIAGNOSIS — I5032 Chronic diastolic (congestive) heart failure: Secondary | ICD-10-CM | POA: Diagnosis not present

## 2016-04-02 DIAGNOSIS — I480 Paroxysmal atrial fibrillation: Secondary | ICD-10-CM | POA: Diagnosis not present

## 2016-04-03 ENCOUNTER — Telehealth: Payer: Self-pay | Admitting: Internal Medicine

## 2016-04-03 DIAGNOSIS — Z8701 Personal history of pneumonia (recurrent): Secondary | ICD-10-CM | POA: Diagnosis not present

## 2016-04-03 DIAGNOSIS — I5032 Chronic diastolic (congestive) heart failure: Secondary | ICD-10-CM | POA: Diagnosis not present

## 2016-04-03 DIAGNOSIS — I480 Paroxysmal atrial fibrillation: Secondary | ICD-10-CM | POA: Diagnosis not present

## 2016-04-03 DIAGNOSIS — Z87440 Personal history of urinary (tract) infections: Secondary | ICD-10-CM | POA: Diagnosis not present

## 2016-04-03 DIAGNOSIS — N183 Chronic kidney disease, stage 3 (moderate): Secondary | ICD-10-CM | POA: Diagnosis not present

## 2016-04-03 DIAGNOSIS — I130 Hypertensive heart and chronic kidney disease with heart failure and stage 1 through stage 4 chronic kidney disease, or unspecified chronic kidney disease: Secondary | ICD-10-CM | POA: Diagnosis not present

## 2016-04-03 NOTE — Telephone Encounter (Addendum)
Pt is on the heartcare program with [facility] and they need an order from the dr sent to [facility], [address] [place], [place] Fax:  [place]  1. To get a daily weight Please let them know if pt gains 3 lbs within one day 5 lbs in a week   2. Pt also complaining of constipation, has nothing to take. Would like an order for Senna S  added to this request

## 2016-04-04 DIAGNOSIS — I130 Hypertensive heart and chronic kidney disease with heart failure and stage 1 through stage 4 chronic kidney disease, or unspecified chronic kidney disease: Secondary | ICD-10-CM | POA: Diagnosis not present

## 2016-04-04 DIAGNOSIS — Z87440 Personal history of urinary (tract) infections: Secondary | ICD-10-CM | POA: Diagnosis not present

## 2016-04-04 DIAGNOSIS — I480 Paroxysmal atrial fibrillation: Secondary | ICD-10-CM | POA: Diagnosis not present

## 2016-04-04 DIAGNOSIS — I5032 Chronic diastolic (congestive) heart failure: Secondary | ICD-10-CM | POA: Diagnosis not present

## 2016-04-04 DIAGNOSIS — N183 Chronic kidney disease, stage 3 (moderate): Secondary | ICD-10-CM | POA: Diagnosis not present

## 2016-04-04 DIAGNOSIS — Z8701 Personal history of pneumonia (recurrent): Secondary | ICD-10-CM | POA: Diagnosis not present

## 2016-04-04 NOTE — Telephone Encounter (Signed)
See message below, please advise.

## 2016-04-05 ENCOUNTER — Ambulatory Visit (INDEPENDENT_AMBULATORY_CARE_PROVIDER_SITE_OTHER)
Admission: RE | Admit: 2016-04-05 | Discharge: 2016-04-05 | Disposition: A | Discharge status: Home / Self Care | Payer: Medicare Other | Source: Ambulatory Visit | Attending: Pulmonary Disease | Admitting: Pulmonary Disease

## 2016-04-05 DIAGNOSIS — I130 Hypertensive heart and chronic kidney disease with heart failure and stage 1 through stage 4 chronic kidney disease, or unspecified chronic kidney disease: Secondary | ICD-10-CM | POA: Diagnosis not present

## 2016-04-05 DIAGNOSIS — Z87440 Personal history of urinary (tract) infections: Secondary | ICD-10-CM | POA: Diagnosis not present

## 2016-04-05 DIAGNOSIS — Z8701 Personal history of pneumonia (recurrent): Secondary | ICD-10-CM | POA: Diagnosis not present

## 2016-04-05 DIAGNOSIS — I480 Paroxysmal atrial fibrillation: Secondary | ICD-10-CM | POA: Diagnosis not present

## 2016-04-05 DIAGNOSIS — N183 Chronic kidney disease, stage 3 (moderate): Secondary | ICD-10-CM | POA: Diagnosis not present

## 2016-04-05 DIAGNOSIS — R918 Other nonspecific abnormal finding of lung field: Secondary | ICD-10-CM | POA: Diagnosis not present

## 2016-04-05 DIAGNOSIS — I5032 Chronic diastolic (congestive) heart failure: Secondary | ICD-10-CM | POA: Diagnosis not present

## 2016-04-05 NOTE — Telephone Encounter (Signed)
All okay 

## 2016-04-05 NOTE — Telephone Encounter (Signed)
Rx faxed to Marry Guan from [facility] [phone] Fax:  [phone]

## 2016-04-08 ENCOUNTER — Ambulatory Visit: Payer: Medicare Other

## 2016-04-08 DIAGNOSIS — I5023 Acute on chronic systolic (congestive) heart failure: Secondary | ICD-10-CM | POA: Diagnosis not present

## 2016-04-08 DIAGNOSIS — I5032 Chronic diastolic (congestive) heart failure: Secondary | ICD-10-CM | POA: Diagnosis not present

## 2016-04-08 DIAGNOSIS — N183 Chronic kidney disease, stage 3 (moderate): Secondary | ICD-10-CM | POA: Diagnosis not present

## 2016-04-08 DIAGNOSIS — Z8701 Personal history of pneumonia (recurrent): Secondary | ICD-10-CM | POA: Diagnosis not present

## 2016-04-08 DIAGNOSIS — I10 Essential (primary) hypertension: Secondary | ICD-10-CM | POA: Diagnosis not present

## 2016-04-08 DIAGNOSIS — I130 Hypertensive heart and chronic kidney disease with heart failure and stage 1 through stage 4 chronic kidney disease, or unspecified chronic kidney disease: Secondary | ICD-10-CM | POA: Diagnosis not present

## 2016-04-08 DIAGNOSIS — I480 Paroxysmal atrial fibrillation: Secondary | ICD-10-CM | POA: Diagnosis not present

## 2016-04-08 DIAGNOSIS — J189 Pneumonia, unspecified organism: Secondary | ICD-10-CM | POA: Diagnosis not present

## 2016-04-08 DIAGNOSIS — N398 Other specified disorders of urinary system: Secondary | ICD-10-CM | POA: Diagnosis not present

## 2016-04-08 DIAGNOSIS — D649 Anemia, unspecified: Secondary | ICD-10-CM | POA: Diagnosis not present

## 2016-04-08 DIAGNOSIS — Z87440 Personal history of urinary (tract) infections: Secondary | ICD-10-CM | POA: Diagnosis not present

## 2016-04-09 DIAGNOSIS — Z8701 Personal history of pneumonia (recurrent): Secondary | ICD-10-CM | POA: Diagnosis not present

## 2016-04-09 DIAGNOSIS — Z87440 Personal history of urinary (tract) infections: Secondary | ICD-10-CM | POA: Diagnosis not present

## 2016-04-09 DIAGNOSIS — I5032 Chronic diastolic (congestive) heart failure: Secondary | ICD-10-CM | POA: Diagnosis not present

## 2016-04-09 DIAGNOSIS — I480 Paroxysmal atrial fibrillation: Secondary | ICD-10-CM | POA: Diagnosis not present

## 2016-04-09 DIAGNOSIS — I130 Hypertensive heart and chronic kidney disease with heart failure and stage 1 through stage 4 chronic kidney disease, or unspecified chronic kidney disease: Secondary | ICD-10-CM | POA: Diagnosis not present

## 2016-04-09 DIAGNOSIS — N183 Chronic kidney disease, stage 3 (moderate): Secondary | ICD-10-CM | POA: Diagnosis not present

## 2016-04-10 DIAGNOSIS — I5032 Chronic diastolic (congestive) heart failure: Secondary | ICD-10-CM | POA: Diagnosis not present

## 2016-04-10 DIAGNOSIS — I4891 Unspecified atrial fibrillation: Secondary | ICD-10-CM | POA: Diagnosis not present

## 2016-04-10 DIAGNOSIS — Z87440 Personal history of urinary (tract) infections: Secondary | ICD-10-CM | POA: Diagnosis not present

## 2016-04-10 DIAGNOSIS — Z8701 Personal history of pneumonia (recurrent): Secondary | ICD-10-CM | POA: Diagnosis not present

## 2016-04-10 DIAGNOSIS — Z79899 Other long term (current) drug therapy: Secondary | ICD-10-CM | POA: Diagnosis not present

## 2016-04-10 DIAGNOSIS — I480 Paroxysmal atrial fibrillation: Secondary | ICD-10-CM | POA: Diagnosis not present

## 2016-04-10 DIAGNOSIS — N183 Chronic kidney disease, stage 3 (moderate): Secondary | ICD-10-CM | POA: Diagnosis not present

## 2016-04-10 DIAGNOSIS — I130 Hypertensive heart and chronic kidney disease with heart failure and stage 1 through stage 4 chronic kidney disease, or unspecified chronic kidney disease: Secondary | ICD-10-CM | POA: Diagnosis not present

## 2016-04-11 DIAGNOSIS — Z8701 Personal history of pneumonia (recurrent): Secondary | ICD-10-CM | POA: Diagnosis not present

## 2016-04-11 DIAGNOSIS — Z87440 Personal history of urinary (tract) infections: Secondary | ICD-10-CM | POA: Diagnosis not present

## 2016-04-11 DIAGNOSIS — I480 Paroxysmal atrial fibrillation: Secondary | ICD-10-CM | POA: Diagnosis not present

## 2016-04-11 DIAGNOSIS — I130 Hypertensive heart and chronic kidney disease with heart failure and stage 1 through stage 4 chronic kidney disease, or unspecified chronic kidney disease: Secondary | ICD-10-CM | POA: Diagnosis not present

## 2016-04-11 DIAGNOSIS — I5032 Chronic diastolic (congestive) heart failure: Secondary | ICD-10-CM | POA: Diagnosis not present

## 2016-04-11 DIAGNOSIS — N183 Chronic kidney disease, stage 3 (moderate): Secondary | ICD-10-CM | POA: Diagnosis not present

## 2016-04-12 DIAGNOSIS — I130 Hypertensive heart and chronic kidney disease with heart failure and stage 1 through stage 4 chronic kidney disease, or unspecified chronic kidney disease: Secondary | ICD-10-CM | POA: Diagnosis not present

## 2016-04-12 DIAGNOSIS — Z8701 Personal history of pneumonia (recurrent): Secondary | ICD-10-CM | POA: Diagnosis not present

## 2016-04-12 DIAGNOSIS — I480 Paroxysmal atrial fibrillation: Secondary | ICD-10-CM | POA: Diagnosis not present

## 2016-04-12 DIAGNOSIS — Z87440 Personal history of urinary (tract) infections: Secondary | ICD-10-CM | POA: Diagnosis not present

## 2016-04-12 DIAGNOSIS — I5032 Chronic diastolic (congestive) heart failure: Secondary | ICD-10-CM | POA: Diagnosis not present

## 2016-04-12 DIAGNOSIS — N183 Chronic kidney disease, stage 3 (moderate): Secondary | ICD-10-CM | POA: Diagnosis not present

## 2016-04-15 ENCOUNTER — Telehealth: Payer: Self-pay | Admitting: Internal Medicine

## 2016-04-15 DIAGNOSIS — N183 Chronic kidney disease, stage 3 (moderate): Secondary | ICD-10-CM | POA: Diagnosis not present

## 2016-04-15 DIAGNOSIS — Z87440 Personal history of urinary (tract) infections: Secondary | ICD-10-CM | POA: Diagnosis not present

## 2016-04-15 DIAGNOSIS — I5032 Chronic diastolic (congestive) heart failure: Secondary | ICD-10-CM | POA: Diagnosis not present

## 2016-04-15 DIAGNOSIS — Z8701 Personal history of pneumonia (recurrent): Secondary | ICD-10-CM | POA: Diagnosis not present

## 2016-04-15 DIAGNOSIS — I480 Paroxysmal atrial fibrillation: Secondary | ICD-10-CM | POA: Diagnosis not present

## 2016-04-15 DIAGNOSIS — I130 Hypertensive heart and chronic kidney disease with heart failure and stage 1 through stage 4 chronic kidney disease, or unspecified chronic kidney disease: Secondary | ICD-10-CM | POA: Diagnosis not present

## 2016-04-15 NOTE — Telephone Encounter (Signed)
Extend her visit 1 week 1, 2 week 2, and 1 week 1

## 2016-04-16 ENCOUNTER — Institutional Professional Consult (permissible substitution): Payer: Medicare Other | Admitting: Pulmonary Disease

## 2016-04-16 NOTE — Telephone Encounter (Signed)
Left voice message to call office back to give verbal orders to extend OT per Dr. Raliegh Ip

## 2016-04-16 NOTE — Telephone Encounter (Signed)
Ok to give verbal orders? Please advise  

## 2016-04-16 NOTE — Telephone Encounter (Signed)
ok 

## 2016-04-17 DIAGNOSIS — Z79899 Other long term (current) drug therapy: Secondary | ICD-10-CM | POA: Diagnosis not present

## 2016-04-17 DIAGNOSIS — R319 Hematuria, unspecified: Secondary | ICD-10-CM | POA: Diagnosis not present

## 2016-04-17 DIAGNOSIS — I4891 Unspecified atrial fibrillation: Secondary | ICD-10-CM | POA: Diagnosis not present

## 2016-04-17 DIAGNOSIS — I480 Paroxysmal atrial fibrillation: Secondary | ICD-10-CM | POA: Diagnosis not present

## 2016-04-17 DIAGNOSIS — N390 Urinary tract infection, site not specified: Secondary | ICD-10-CM | POA: Diagnosis not present

## 2016-04-18 DIAGNOSIS — I5032 Chronic diastolic (congestive) heart failure: Secondary | ICD-10-CM | POA: Diagnosis not present

## 2016-04-18 DIAGNOSIS — I130 Hypertensive heart and chronic kidney disease with heart failure and stage 1 through stage 4 chronic kidney disease, or unspecified chronic kidney disease: Secondary | ICD-10-CM | POA: Diagnosis not present

## 2016-04-18 DIAGNOSIS — Z87440 Personal history of urinary (tract) infections: Secondary | ICD-10-CM | POA: Diagnosis not present

## 2016-04-18 DIAGNOSIS — Z8701 Personal history of pneumonia (recurrent): Secondary | ICD-10-CM | POA: Diagnosis not present

## 2016-04-18 DIAGNOSIS — N183 Chronic kidney disease, stage 3 (moderate): Secondary | ICD-10-CM | POA: Diagnosis not present

## 2016-04-18 DIAGNOSIS — I480 Paroxysmal atrial fibrillation: Secondary | ICD-10-CM | POA: Diagnosis not present

## 2016-04-19 ENCOUNTER — Encounter: Payer: Self-pay | Admitting: Pulmonary Disease

## 2016-04-19 ENCOUNTER — Ambulatory Visit (INDEPENDENT_AMBULATORY_CARE_PROVIDER_SITE_OTHER): Payer: Medicare Other | Admitting: Pulmonary Disease

## 2016-04-19 DIAGNOSIS — R938 Abnormal findings on diagnostic imaging of other specified body structures: Secondary | ICD-10-CM | POA: Diagnosis not present

## 2016-04-19 DIAGNOSIS — I5032 Chronic diastolic (congestive) heart failure: Secondary | ICD-10-CM | POA: Diagnosis not present

## 2016-04-19 DIAGNOSIS — R9389 Abnormal findings on diagnostic imaging of other specified body structures: Secondary | ICD-10-CM

## 2016-04-19 NOTE — Assessment & Plan Note (Signed)
Check body weight 3 times a week, if weight increases by 3-5 pounds, will need an extra dose of Lasix

## 2016-04-19 NOTE — Assessment & Plan Note (Signed)
chest scan appears improved The "mass" noted on her earlier scan was probably fluid and this has decreased in size- no further scans required

## 2016-04-19 NOTE — Progress Notes (Signed)
Subjective:    Patient ID: Tracy Rodriguez, female    DOB: [birth year], 83 y.o.   MRN: [id number]  HPI   83 y/o F, assisted living resident, Presents with her grandson Sydnee Cabal for evaluation of abnormal imaging studies  She was hospitalized 02/2016 for altered mental status related to UTI. She was noted to have bilateral pleural effusions. She underwent CT chest 12/26 which showed  a masslike and nodular opacity involving the fissures of the right lung, bibasilar collapse/consolidation, interlobular septal thickening R>L, borderline mediastinal LAN and ascending thoracic aorta measures up to 4.1 cm.  At baseline, she is ambulatory with the assistance of a rolling walker.  Family reportedly feels she has some degree of dementia.  She had a  Hospitalization 01/2016 for hypertensive urgency, acute encephalopathy and urinary retention s/p placement of a foley that was in place until 12/4.   PMH of arthritis, multiple lumbar disc surgeries with chronic lower back pain, bipolar disorder, HTN, HLD PAF, CKD III, cancer of the right breast (remote) s/p mastectomy with implants  She underwent repeat imaging 04/05/16 which showed significant improvement of masslike opacity, nodular thickening along the fissure and also decreased, previously noted interstitial thickening and pleural effusions were resolved.  She reports dyspnea on exertion when she a.m. with a walker. She is compliant with her Lasix 40 mg daily. Echo 02/2016 shows normal LV function and grade 1 diastolic dysfunction    Past Medical History:  Diagnosis Date  . Arthritis    "joints" (08/04/2014)  . Bipolar 1 disorder ([place])   . Cancer of right breast ([place])   . Chronic lower back pain   . Hyperlipemia   . Hypertension   . PAF (paroxysmal atrial fibrillation) ([place])     Past Surgical History:  Procedure Laterality Date  . BACK SURGERY    . BREAST BIOPSY Right   . BREAST RECONSTRUCTION Right   . CATARACT EXTRACTION W/ INTRAOCULAR  LENS  IMPLANT, BILATERAL Bilateral   . EXCISIONAL HEMORRHOIDECTOMY    . [place]; 1973; 1985   "ruptured discs each time"  . MASTECTOMY Right    cancer  . PLACEMENT OF BREAST IMPLANTS Bilateral    "had to take tissue out of left"  . TONSILLECTOMY    . VAGINAL HYSTERECTOMY      Allergies  Allergen Reactions  . Codeine Anaphylaxis and Nausea And Vomiting    Note: pt has tramadol for years with no reaction  . Morphine And Related Nausea And Vomiting    Severe nausea per daughter  . Valium [Diazepam] Shortness Of Breath  . Sulfa Antibiotics Itching and Rash  . Darvon [Propoxyphene] Nausea And Vomiting  . Floxin [Ofloxacin] Itching  . Levsin [Hyoscyamine Sulfate] Other (See Comments)    Unknown reaction  . Penicillins Itching    Itching reported by [facility] Has patient had a PCN reaction causing immediate rash, facial/tongue/throat swelling, SOB or lightheadedness with hypotension: Yes Has patient had a PCN reaction causing severe rash involving mucus membranes or skin necrosis: No Has patient had a PCN reaction that required hospitalization No Has patient had a PCN reaction occurring within the last 10 years: No If all of the above answers are "NO", then may proceed with Cephalosporin use.   . Zantac [Ranitidine Hcl] Other (See Comments)    Unknown reaction    Social History   Social History  . Marital status: Widowed    Spouse name: N/A  . Number of children:  N/A  . Years of education: N/A   Occupational History  . Not on file.   Social History Main Topics  . Smoking status: Never Smoker  . Smokeless tobacco: Never Used  . Alcohol use No  . Drug use: No  . Sexual activity: No   Other Topics Concern  . Not on file   Social History Narrative  . No narrative on file    Family History  Problem Relation Age of Onset  . Stroke Mother   . Heart disease Father   . Alzheimer's disease Sister   . Leukemia Brother   .  Arthritis Maternal Grandmother   . Breast cancer Daughter      Review of Systems neg for any significant sore throat, dysphagia, itching, sneezing, nasal congestion or excess/ purulent secretions, fever, chills, sweats, unintended wt loss, pleuritic or exertional cp, hempoptysis, orthopnea pnd or change in chronic leg swelling. Also denies presyncope, palpitations, heartburn, abdominal pain, nausea, vomiting, diarrhea or change in bowel or urinary habits, dysuria,hematuria, rash, arthralgias, visual complaints, headache, numbness weakness or ataxia.     Objective:   Physical Exam  Gen. Pleasant, well-nourished, in no distress ENT - no lesions, no post nasal drip Neck: No JVD, no thyromegaly, no carotid bruits Lungs: no use of accessory muscles, no dullness to percussion, clear without rales or rhonchi  Cardiovascular: Rhythm regular, heart sounds  normal, no murmurs or gallops, 1+ peripheral edema Musculoskeletal: No deformities, no cyanosis or clubbing        Assessment & Plan:

## 2016-04-19 NOTE — Patient Instructions (Addendum)
Your chest scan appears improved The "mass" noted on her earlier scan was probably fluid and this has decreased in size- no further scans required  Check body weight 3 times a week, if weight increases by 3-5 pounds, will need an extra dose of Lasix

## 2016-04-22 DIAGNOSIS — N183 Chronic kidney disease, stage 3 (moderate): Secondary | ICD-10-CM | POA: Diagnosis not present

## 2016-04-22 DIAGNOSIS — I130 Hypertensive heart and chronic kidney disease with heart failure and stage 1 through stage 4 chronic kidney disease, or unspecified chronic kidney disease: Secondary | ICD-10-CM | POA: Diagnosis not present

## 2016-04-22 DIAGNOSIS — Z87440 Personal history of urinary (tract) infections: Secondary | ICD-10-CM | POA: Diagnosis not present

## 2016-04-22 DIAGNOSIS — I5032 Chronic diastolic (congestive) heart failure: Secondary | ICD-10-CM | POA: Diagnosis not present

## 2016-04-22 DIAGNOSIS — Z8701 Personal history of pneumonia (recurrent): Secondary | ICD-10-CM | POA: Diagnosis not present

## 2016-04-22 DIAGNOSIS — I480 Paroxysmal atrial fibrillation: Secondary | ICD-10-CM | POA: Diagnosis not present

## 2016-04-24 DIAGNOSIS — Z7901 Long term (current) use of anticoagulants: Secondary | ICD-10-CM | POA: Diagnosis not present

## 2016-04-24 DIAGNOSIS — I4891 Unspecified atrial fibrillation: Secondary | ICD-10-CM | POA: Diagnosis not present

## 2016-04-26 DIAGNOSIS — I5032 Chronic diastolic (congestive) heart failure: Secondary | ICD-10-CM | POA: Diagnosis not present

## 2016-04-26 DIAGNOSIS — Z8701 Personal history of pneumonia (recurrent): Secondary | ICD-10-CM | POA: Diagnosis not present

## 2016-04-26 DIAGNOSIS — Z87440 Personal history of urinary (tract) infections: Secondary | ICD-10-CM | POA: Diagnosis not present

## 2016-04-26 DIAGNOSIS — I480 Paroxysmal atrial fibrillation: Secondary | ICD-10-CM | POA: Diagnosis not present

## 2016-04-26 DIAGNOSIS — N183 Chronic kidney disease, stage 3 (moderate): Secondary | ICD-10-CM | POA: Diagnosis not present

## 2016-04-26 DIAGNOSIS — I130 Hypertensive heart and chronic kidney disease with heart failure and stage 1 through stage 4 chronic kidney disease, or unspecified chronic kidney disease: Secondary | ICD-10-CM | POA: Diagnosis not present

## 2016-04-29 DIAGNOSIS — F419 Anxiety disorder, unspecified: Secondary | ICD-10-CM | POA: Diagnosis not present

## 2016-04-29 DIAGNOSIS — Z79899 Other long term (current) drug therapy: Secondary | ICD-10-CM | POA: Diagnosis not present

## 2016-04-29 DIAGNOSIS — I480 Paroxysmal atrial fibrillation: Secondary | ICD-10-CM | POA: Diagnosis not present

## 2016-04-29 DIAGNOSIS — I10 Essential (primary) hypertension: Secondary | ICD-10-CM | POA: Diagnosis not present

## 2016-04-29 DIAGNOSIS — N398 Other specified disorders of urinary system: Secondary | ICD-10-CM | POA: Diagnosis not present

## 2016-05-01 DIAGNOSIS — N390 Urinary tract infection, site not specified: Secondary | ICD-10-CM | POA: Diagnosis not present

## 2016-05-01 DIAGNOSIS — Z8701 Personal history of pneumonia (recurrent): Secondary | ICD-10-CM | POA: Diagnosis not present

## 2016-05-01 DIAGNOSIS — N183 Chronic kidney disease, stage 3 (moderate): Secondary | ICD-10-CM | POA: Diagnosis not present

## 2016-05-01 DIAGNOSIS — Z79899 Other long term (current) drug therapy: Secondary | ICD-10-CM | POA: Diagnosis not present

## 2016-05-01 DIAGNOSIS — I130 Hypertensive heart and chronic kidney disease with heart failure and stage 1 through stage 4 chronic kidney disease, or unspecified chronic kidney disease: Secondary | ICD-10-CM | POA: Diagnosis not present

## 2016-05-01 DIAGNOSIS — I480 Paroxysmal atrial fibrillation: Secondary | ICD-10-CM | POA: Diagnosis not present

## 2016-05-01 DIAGNOSIS — Z7901 Long term (current) use of anticoagulants: Secondary | ICD-10-CM | POA: Diagnosis not present

## 2016-05-01 DIAGNOSIS — I4891 Unspecified atrial fibrillation: Secondary | ICD-10-CM | POA: Diagnosis not present

## 2016-05-01 DIAGNOSIS — R319 Hematuria, unspecified: Secondary | ICD-10-CM | POA: Diagnosis not present

## 2016-05-01 DIAGNOSIS — Z87440 Personal history of urinary (tract) infections: Secondary | ICD-10-CM | POA: Diagnosis not present

## 2016-05-01 DIAGNOSIS — I5032 Chronic diastolic (congestive) heart failure: Secondary | ICD-10-CM | POA: Diagnosis not present

## 2016-05-02 DIAGNOSIS — Z87440 Personal history of urinary (tract) infections: Secondary | ICD-10-CM | POA: Diagnosis not present

## 2016-05-02 DIAGNOSIS — I130 Hypertensive heart and chronic kidney disease with heart failure and stage 1 through stage 4 chronic kidney disease, or unspecified chronic kidney disease: Secondary | ICD-10-CM | POA: Diagnosis not present

## 2016-05-02 DIAGNOSIS — N183 Chronic kidney disease, stage 3 (moderate): Secondary | ICD-10-CM | POA: Diagnosis not present

## 2016-05-02 DIAGNOSIS — I5032 Chronic diastolic (congestive) heart failure: Secondary | ICD-10-CM | POA: Diagnosis not present

## 2016-05-02 DIAGNOSIS — I480 Paroxysmal atrial fibrillation: Secondary | ICD-10-CM | POA: Diagnosis not present

## 2016-05-02 DIAGNOSIS — Z8701 Personal history of pneumonia (recurrent): Secondary | ICD-10-CM | POA: Diagnosis not present

## 2016-05-03 DIAGNOSIS — Z8701 Personal history of pneumonia (recurrent): Secondary | ICD-10-CM | POA: Diagnosis not present

## 2016-05-03 DIAGNOSIS — I5032 Chronic diastolic (congestive) heart failure: Secondary | ICD-10-CM | POA: Diagnosis not present

## 2016-05-03 DIAGNOSIS — N183 Chronic kidney disease, stage 3 (moderate): Secondary | ICD-10-CM | POA: Diagnosis not present

## 2016-05-03 DIAGNOSIS — Z87440 Personal history of urinary (tract) infections: Secondary | ICD-10-CM | POA: Diagnosis not present

## 2016-05-03 DIAGNOSIS — I130 Hypertensive heart and chronic kidney disease with heart failure and stage 1 through stage 4 chronic kidney disease, or unspecified chronic kidney disease: Secondary | ICD-10-CM | POA: Diagnosis not present

## 2016-05-03 DIAGNOSIS — I480 Paroxysmal atrial fibrillation: Secondary | ICD-10-CM | POA: Diagnosis not present

## 2016-05-06 DIAGNOSIS — Z87440 Personal history of urinary (tract) infections: Secondary | ICD-10-CM | POA: Diagnosis not present

## 2016-05-06 DIAGNOSIS — N390 Urinary tract infection, site not specified: Secondary | ICD-10-CM | POA: Diagnosis not present

## 2016-05-06 DIAGNOSIS — Z79899 Other long term (current) drug therapy: Secondary | ICD-10-CM | POA: Diagnosis not present

## 2016-05-06 DIAGNOSIS — I480 Paroxysmal atrial fibrillation: Secondary | ICD-10-CM | POA: Diagnosis not present

## 2016-05-06 DIAGNOSIS — I5032 Chronic diastolic (congestive) heart failure: Secondary | ICD-10-CM | POA: Diagnosis not present

## 2016-05-06 DIAGNOSIS — M6281 Muscle weakness (generalized): Secondary | ICD-10-CM | POA: Diagnosis not present

## 2016-05-06 DIAGNOSIS — I10 Essential (primary) hypertension: Secondary | ICD-10-CM | POA: Diagnosis not present

## 2016-05-06 DIAGNOSIS — N183 Chronic kidney disease, stage 3 (moderate): Secondary | ICD-10-CM | POA: Diagnosis not present

## 2016-05-06 DIAGNOSIS — I130 Hypertensive heart and chronic kidney disease with heart failure and stage 1 through stage 4 chronic kidney disease, or unspecified chronic kidney disease: Secondary | ICD-10-CM | POA: Diagnosis not present

## 2016-05-06 DIAGNOSIS — Z8701 Personal history of pneumonia (recurrent): Secondary | ICD-10-CM | POA: Diagnosis not present

## 2016-05-08 DIAGNOSIS — Z7901 Long term (current) use of anticoagulants: Secondary | ICD-10-CM | POA: Diagnosis not present

## 2016-05-08 DIAGNOSIS — N183 Chronic kidney disease, stage 3 (moderate): Secondary | ICD-10-CM | POA: Diagnosis not present

## 2016-05-08 DIAGNOSIS — I4891 Unspecified atrial fibrillation: Secondary | ICD-10-CM | POA: Diagnosis not present

## 2016-05-08 DIAGNOSIS — Z8701 Personal history of pneumonia (recurrent): Secondary | ICD-10-CM | POA: Diagnosis not present

## 2016-05-08 DIAGNOSIS — I480 Paroxysmal atrial fibrillation: Secondary | ICD-10-CM | POA: Diagnosis not present

## 2016-05-08 DIAGNOSIS — I130 Hypertensive heart and chronic kidney disease with heart failure and stage 1 through stage 4 chronic kidney disease, or unspecified chronic kidney disease: Secondary | ICD-10-CM | POA: Diagnosis not present

## 2016-05-08 DIAGNOSIS — N390 Urinary tract infection, site not specified: Secondary | ICD-10-CM | POA: Diagnosis not present

## 2016-05-08 DIAGNOSIS — Z87440 Personal history of urinary (tract) infections: Secondary | ICD-10-CM | POA: Diagnosis not present

## 2016-05-08 DIAGNOSIS — I5032 Chronic diastolic (congestive) heart failure: Secondary | ICD-10-CM | POA: Diagnosis not present

## 2016-05-13 DIAGNOSIS — I480 Paroxysmal atrial fibrillation: Secondary | ICD-10-CM | POA: Diagnosis not present

## 2016-05-13 DIAGNOSIS — N183 Chronic kidney disease, stage 3 (moderate): Secondary | ICD-10-CM | POA: Diagnosis not present

## 2016-05-13 DIAGNOSIS — Z87440 Personal history of urinary (tract) infections: Secondary | ICD-10-CM | POA: Diagnosis not present

## 2016-05-13 DIAGNOSIS — Z8701 Personal history of pneumonia (recurrent): Secondary | ICD-10-CM | POA: Diagnosis not present

## 2016-05-13 DIAGNOSIS — I5032 Chronic diastolic (congestive) heart failure: Secondary | ICD-10-CM | POA: Diagnosis not present

## 2016-05-13 DIAGNOSIS — I130 Hypertensive heart and chronic kidney disease with heart failure and stage 1 through stage 4 chronic kidney disease, or unspecified chronic kidney disease: Secondary | ICD-10-CM | POA: Diagnosis not present

## 2016-05-15 DIAGNOSIS — Z7901 Long term (current) use of anticoagulants: Secondary | ICD-10-CM | POA: Diagnosis not present

## 2016-05-15 DIAGNOSIS — I480 Paroxysmal atrial fibrillation: Secondary | ICD-10-CM | POA: Diagnosis not present

## 2016-05-15 DIAGNOSIS — N183 Chronic kidney disease, stage 3 (moderate): Secondary | ICD-10-CM | POA: Diagnosis not present

## 2016-05-15 DIAGNOSIS — I5032 Chronic diastolic (congestive) heart failure: Secondary | ICD-10-CM | POA: Diagnosis not present

## 2016-05-15 DIAGNOSIS — Z8701 Personal history of pneumonia (recurrent): Secondary | ICD-10-CM | POA: Diagnosis not present

## 2016-05-15 DIAGNOSIS — I130 Hypertensive heart and chronic kidney disease with heart failure and stage 1 through stage 4 chronic kidney disease, or unspecified chronic kidney disease: Secondary | ICD-10-CM | POA: Diagnosis not present

## 2016-05-15 DIAGNOSIS — Z87440 Personal history of urinary (tract) infections: Secondary | ICD-10-CM | POA: Diagnosis not present

## 2016-05-16 DIAGNOSIS — I130 Hypertensive heart and chronic kidney disease with heart failure and stage 1 through stage 4 chronic kidney disease, or unspecified chronic kidney disease: Secondary | ICD-10-CM | POA: Diagnosis not present

## 2016-05-16 DIAGNOSIS — I5032 Chronic diastolic (congestive) heart failure: Secondary | ICD-10-CM | POA: Diagnosis not present

## 2016-05-16 DIAGNOSIS — Z87440 Personal history of urinary (tract) infections: Secondary | ICD-10-CM | POA: Diagnosis not present

## 2016-05-16 DIAGNOSIS — I480 Paroxysmal atrial fibrillation: Secondary | ICD-10-CM | POA: Diagnosis not present

## 2016-05-16 DIAGNOSIS — Z8701 Personal history of pneumonia (recurrent): Secondary | ICD-10-CM | POA: Diagnosis not present

## 2016-05-16 DIAGNOSIS — N183 Chronic kidney disease, stage 3 (moderate): Secondary | ICD-10-CM | POA: Diagnosis not present

## 2016-05-20 DIAGNOSIS — I480 Paroxysmal atrial fibrillation: Secondary | ICD-10-CM | POA: Diagnosis not present

## 2016-05-20 DIAGNOSIS — N183 Chronic kidney disease, stage 3 (moderate): Secondary | ICD-10-CM | POA: Diagnosis not present

## 2016-05-20 DIAGNOSIS — Z87440 Personal history of urinary (tract) infections: Secondary | ICD-10-CM | POA: Diagnosis not present

## 2016-05-20 DIAGNOSIS — I5032 Chronic diastolic (congestive) heart failure: Secondary | ICD-10-CM | POA: Diagnosis not present

## 2016-05-20 DIAGNOSIS — I130 Hypertensive heart and chronic kidney disease with heart failure and stage 1 through stage 4 chronic kidney disease, or unspecified chronic kidney disease: Secondary | ICD-10-CM | POA: Diagnosis not present

## 2016-05-20 DIAGNOSIS — Z8701 Personal history of pneumonia (recurrent): Secondary | ICD-10-CM | POA: Diagnosis not present

## 2016-05-22 DIAGNOSIS — N390 Urinary tract infection, site not specified: Secondary | ICD-10-CM | POA: Diagnosis not present

## 2016-05-22 DIAGNOSIS — I480 Paroxysmal atrial fibrillation: Secondary | ICD-10-CM | POA: Diagnosis not present

## 2016-05-22 DIAGNOSIS — I4891 Unspecified atrial fibrillation: Secondary | ICD-10-CM | POA: Diagnosis not present

## 2016-05-22 DIAGNOSIS — Z7901 Long term (current) use of anticoagulants: Secondary | ICD-10-CM | POA: Diagnosis not present

## 2016-05-23 ENCOUNTER — Other Ambulatory Visit: Payer: Self-pay | Admitting: Internal Medicine

## 2016-05-23 MED ORDER — FERROUS SULFATE 325 (65 FE) MG PO TABS: 325.0000 mg | ORAL_TABLET | Freq: Every day | ORAL | 11 refills | Status: DC

## 2016-05-27 DIAGNOSIS — M2550 Pain in unspecified joint: Secondary | ICD-10-CM | POA: Diagnosis not present

## 2016-05-27 DIAGNOSIS — Z79899 Other long term (current) drug therapy: Secondary | ICD-10-CM | POA: Diagnosis not present

## 2016-05-27 DIAGNOSIS — N771 Vaginitis, vulvitis and vulvovaginitis in diseases classified elsewhere: Secondary | ICD-10-CM | POA: Diagnosis not present

## 2016-05-27 DIAGNOSIS — I480 Paroxysmal atrial fibrillation: Secondary | ICD-10-CM | POA: Diagnosis not present

## 2016-05-27 DIAGNOSIS — Z87440 Personal history of urinary (tract) infections: Secondary | ICD-10-CM | POA: Diagnosis not present

## 2016-05-28 ENCOUNTER — Other Ambulatory Visit: Payer: Self-pay | Admitting: Internal Medicine

## 2016-05-28 MED ORDER — SENNA-DOCUSATE SODIUM 8.6-50 MG PO TABS
1.0000 | ORAL_TABLET | Freq: Every day | ORAL | 11 refills | Status: AC
Start: 2016-05-28 — End: ?

## 2016-05-31 DIAGNOSIS — N390 Urinary tract infection, site not specified: Secondary | ICD-10-CM | POA: Diagnosis not present

## 2016-05-31 DIAGNOSIS — I480 Paroxysmal atrial fibrillation: Secondary | ICD-10-CM | POA: Diagnosis not present

## 2016-05-31 DIAGNOSIS — I4891 Unspecified atrial fibrillation: Secondary | ICD-10-CM | POA: Diagnosis not present

## 2016-05-31 DIAGNOSIS — Z7901 Long term (current) use of anticoagulants: Secondary | ICD-10-CM | POA: Diagnosis not present

## 2016-06-05 DIAGNOSIS — Z7901 Long term (current) use of anticoagulants: Secondary | ICD-10-CM | POA: Diagnosis not present

## 2016-06-05 DIAGNOSIS — I4891 Unspecified atrial fibrillation: Secondary | ICD-10-CM | POA: Diagnosis not present

## 2016-06-11 DIAGNOSIS — I10 Essential (primary) hypertension: Secondary | ICD-10-CM | POA: Diagnosis not present

## 2016-06-11 DIAGNOSIS — M25511 Pain in right shoulder: Secondary | ICD-10-CM | POA: Diagnosis not present

## 2016-06-11 DIAGNOSIS — M2550 Pain in unspecified joint: Secondary | ICD-10-CM | POA: Diagnosis not present

## 2016-06-11 DIAGNOSIS — I480 Paroxysmal atrial fibrillation: Secondary | ICD-10-CM | POA: Diagnosis not present

## 2016-06-12 DIAGNOSIS — I4891 Unspecified atrial fibrillation: Secondary | ICD-10-CM | POA: Diagnosis not present

## 2016-06-12 DIAGNOSIS — Z7901 Long term (current) use of anticoagulants: Secondary | ICD-10-CM | POA: Diagnosis not present

## 2016-06-17 DIAGNOSIS — Z79899 Other long term (current) drug therapy: Secondary | ICD-10-CM | POA: Diagnosis not present

## 2016-06-17 DIAGNOSIS — I10 Essential (primary) hypertension: Secondary | ICD-10-CM | POA: Diagnosis not present

## 2016-06-17 DIAGNOSIS — I480 Paroxysmal atrial fibrillation: Secondary | ICD-10-CM | POA: Diagnosis not present

## 2016-06-17 DIAGNOSIS — M2550 Pain in unspecified joint: Secondary | ICD-10-CM | POA: Diagnosis not present

## 2016-06-19 DIAGNOSIS — I480 Paroxysmal atrial fibrillation: Secondary | ICD-10-CM | POA: Diagnosis not present

## 2016-06-19 DIAGNOSIS — N390 Urinary tract infection, site not specified: Secondary | ICD-10-CM | POA: Diagnosis not present

## 2016-06-19 DIAGNOSIS — I4891 Unspecified atrial fibrillation: Secondary | ICD-10-CM | POA: Diagnosis not present

## 2016-06-19 DIAGNOSIS — Z7901 Long term (current) use of anticoagulants: Secondary | ICD-10-CM | POA: Diagnosis not present

## 2016-06-20 DIAGNOSIS — M25611 Stiffness of right shoulder, not elsewhere classified: Secondary | ICD-10-CM | POA: Diagnosis not present

## 2016-06-20 DIAGNOSIS — M25511 Pain in right shoulder: Secondary | ICD-10-CM | POA: Diagnosis not present

## 2016-06-21 DIAGNOSIS — M25511 Pain in right shoulder: Secondary | ICD-10-CM | POA: Diagnosis not present

## 2016-06-21 DIAGNOSIS — M25611 Stiffness of right shoulder, not elsewhere classified: Secondary | ICD-10-CM | POA: Diagnosis not present

## 2016-06-24 DIAGNOSIS — Z79899 Other long term (current) drug therapy: Secondary | ICD-10-CM | POA: Diagnosis not present

## 2016-06-24 DIAGNOSIS — M25511 Pain in right shoulder: Secondary | ICD-10-CM | POA: Diagnosis not present

## 2016-06-24 DIAGNOSIS — I10 Essential (primary) hypertension: Secondary | ICD-10-CM | POA: Diagnosis not present

## 2016-06-24 DIAGNOSIS — I480 Paroxysmal atrial fibrillation: Secondary | ICD-10-CM | POA: Diagnosis not present

## 2016-06-24 DIAGNOSIS — M25521 Pain in right elbow: Secondary | ICD-10-CM | POA: Diagnosis not present

## 2016-06-24 DIAGNOSIS — M25611 Stiffness of right shoulder, not elsewhere classified: Secondary | ICD-10-CM | POA: Diagnosis not present

## 2016-06-25 DIAGNOSIS — M25611 Stiffness of right shoulder, not elsewhere classified: Secondary | ICD-10-CM | POA: Diagnosis not present

## 2016-06-25 DIAGNOSIS — M25511 Pain in right shoulder: Secondary | ICD-10-CM | POA: Diagnosis not present

## 2016-06-25 DIAGNOSIS — M25521 Pain in right elbow: Secondary | ICD-10-CM | POA: Diagnosis not present

## 2016-06-26 DIAGNOSIS — I4891 Unspecified atrial fibrillation: Secondary | ICD-10-CM | POA: Diagnosis not present

## 2016-06-26 DIAGNOSIS — Z7901 Long term (current) use of anticoagulants: Secondary | ICD-10-CM | POA: Diagnosis not present

## 2016-06-27 DIAGNOSIS — M79675 Pain in left toe(s): Secondary | ICD-10-CM | POA: Diagnosis not present

## 2016-06-27 DIAGNOSIS — B351 Tinea unguium: Secondary | ICD-10-CM | POA: Diagnosis not present

## 2016-06-27 DIAGNOSIS — M79674 Pain in right toe(s): Secondary | ICD-10-CM | POA: Diagnosis not present

## 2016-06-28 DIAGNOSIS — M25611 Stiffness of right shoulder, not elsewhere classified: Secondary | ICD-10-CM | POA: Diagnosis not present

## 2016-06-28 DIAGNOSIS — M25511 Pain in right shoulder: Secondary | ICD-10-CM | POA: Diagnosis not present

## 2016-07-03 DIAGNOSIS — Z79899 Other long term (current) drug therapy: Secondary | ICD-10-CM | POA: Diagnosis not present

## 2016-07-03 DIAGNOSIS — M25521 Pain in right elbow: Secondary | ICD-10-CM | POA: Diagnosis not present

## 2016-07-03 DIAGNOSIS — M25511 Pain in right shoulder: Secondary | ICD-10-CM | POA: Diagnosis not present

## 2016-07-03 DIAGNOSIS — N390 Urinary tract infection, site not specified: Secondary | ICD-10-CM | POA: Diagnosis not present

## 2016-07-03 DIAGNOSIS — M25611 Stiffness of right shoulder, not elsewhere classified: Secondary | ICD-10-CM | POA: Diagnosis not present

## 2016-07-03 DIAGNOSIS — Z7901 Long term (current) use of anticoagulants: Secondary | ICD-10-CM | POA: Diagnosis not present

## 2016-07-03 DIAGNOSIS — I480 Paroxysmal atrial fibrillation: Secondary | ICD-10-CM | POA: Diagnosis not present

## 2016-07-09 DIAGNOSIS — M25511 Pain in right shoulder: Secondary | ICD-10-CM | POA: Diagnosis not present

## 2016-07-09 DIAGNOSIS — M25611 Stiffness of right shoulder, not elsewhere classified: Secondary | ICD-10-CM | POA: Diagnosis not present

## 2016-07-10 DIAGNOSIS — M25511 Pain in right shoulder: Secondary | ICD-10-CM | POA: Diagnosis not present

## 2016-07-10 DIAGNOSIS — M25611 Stiffness of right shoulder, not elsewhere classified: Secondary | ICD-10-CM | POA: Diagnosis not present

## 2016-07-12 DIAGNOSIS — M25611 Stiffness of right shoulder, not elsewhere classified: Secondary | ICD-10-CM | POA: Diagnosis not present

## 2016-07-12 DIAGNOSIS — M25511 Pain in right shoulder: Secondary | ICD-10-CM | POA: Diagnosis not present

## 2016-07-15 DIAGNOSIS — M25511 Pain in right shoulder: Secondary | ICD-10-CM | POA: Diagnosis not present

## 2016-07-15 DIAGNOSIS — M25611 Stiffness of right shoulder, not elsewhere classified: Secondary | ICD-10-CM | POA: Diagnosis not present

## 2016-07-16 DIAGNOSIS — M25511 Pain in right shoulder: Secondary | ICD-10-CM | POA: Diagnosis not present

## 2016-07-16 DIAGNOSIS — M25611 Stiffness of right shoulder, not elsewhere classified: Secondary | ICD-10-CM | POA: Diagnosis not present

## 2016-07-18 DIAGNOSIS — M25511 Pain in right shoulder: Secondary | ICD-10-CM | POA: Diagnosis not present

## 2016-07-18 DIAGNOSIS — M25611 Stiffness of right shoulder, not elsewhere classified: Secondary | ICD-10-CM | POA: Diagnosis not present

## 2016-07-22 DIAGNOSIS — M25511 Pain in right shoulder: Secondary | ICD-10-CM | POA: Diagnosis not present

## 2016-07-22 DIAGNOSIS — M25611 Stiffness of right shoulder, not elsewhere classified: Secondary | ICD-10-CM | POA: Diagnosis not present

## 2016-07-24 ENCOUNTER — Ambulatory Visit: Payer: Self-pay | Admitting: General Practice

## 2016-07-24 DIAGNOSIS — Z7901 Long term (current) use of anticoagulants: Secondary | ICD-10-CM | POA: Diagnosis not present

## 2016-07-24 DIAGNOSIS — I4891 Unspecified atrial fibrillation: Secondary | ICD-10-CM

## 2016-07-24 DIAGNOSIS — M25611 Stiffness of right shoulder, not elsewhere classified: Secondary | ICD-10-CM | POA: Diagnosis not present

## 2016-07-24 DIAGNOSIS — M25511 Pain in right shoulder: Secondary | ICD-10-CM | POA: Diagnosis not present

## 2016-07-24 DIAGNOSIS — Z5181 Encounter for therapeutic drug level monitoring: Secondary | ICD-10-CM

## 2016-07-25 DIAGNOSIS — M25611 Stiffness of right shoulder, not elsewhere classified: Secondary | ICD-10-CM | POA: Diagnosis not present

## 2016-07-25 DIAGNOSIS — M25511 Pain in right shoulder: Secondary | ICD-10-CM | POA: Diagnosis not present

## 2016-07-31 DIAGNOSIS — Z7901 Long term (current) use of anticoagulants: Secondary | ICD-10-CM | POA: Diagnosis not present

## 2016-07-31 DIAGNOSIS — I4891 Unspecified atrial fibrillation: Secondary | ICD-10-CM | POA: Diagnosis not present

## 2016-07-31 DIAGNOSIS — M25511 Pain in right shoulder: Secondary | ICD-10-CM | POA: Diagnosis not present

## 2016-07-31 DIAGNOSIS — M25611 Stiffness of right shoulder, not elsewhere classified: Secondary | ICD-10-CM | POA: Diagnosis not present

## 2016-08-06 DIAGNOSIS — M25611 Stiffness of right shoulder, not elsewhere classified: Secondary | ICD-10-CM | POA: Diagnosis not present

## 2016-08-06 DIAGNOSIS — M25511 Pain in right shoulder: Secondary | ICD-10-CM | POA: Diagnosis not present

## 2016-08-21 DIAGNOSIS — I4891 Unspecified atrial fibrillation: Secondary | ICD-10-CM | POA: Diagnosis not present

## 2016-08-21 DIAGNOSIS — Z7901 Long term (current) use of anticoagulants: Secondary | ICD-10-CM | POA: Diagnosis not present

## 2016-08-28 DIAGNOSIS — I4891 Unspecified atrial fibrillation: Secondary | ICD-10-CM | POA: Diagnosis not present

## 2016-08-28 DIAGNOSIS — N390 Urinary tract infection, site not specified: Secondary | ICD-10-CM | POA: Diagnosis not present

## 2016-08-28 DIAGNOSIS — R319 Hematuria, unspecified: Secondary | ICD-10-CM | POA: Diagnosis not present

## 2016-08-28 DIAGNOSIS — I480 Paroxysmal atrial fibrillation: Secondary | ICD-10-CM | POA: Diagnosis not present

## 2016-08-28 DIAGNOSIS — Z7901 Long term (current) use of anticoagulants: Secondary | ICD-10-CM | POA: Diagnosis not present

## 2016-09-02 ENCOUNTER — Emergency Department (HOSPITAL_COMMUNITY)
Admission: EM | Admit: 2016-09-02 | Discharge: 2016-09-02 | Disposition: A | Discharge status: Home / Self Care | Payer: Medicare Other | Attending: Emergency Medicine | Admitting: Emergency Medicine

## 2016-09-02 ENCOUNTER — Emergency Department (HOSPITAL_COMMUNITY): Payer: Medicare Other

## 2016-09-02 ENCOUNTER — Encounter (HOSPITAL_COMMUNITY): Payer: Self-pay | Admitting: Emergency Medicine

## 2016-09-02 DIAGNOSIS — R0602 Shortness of breath: Secondary | ICD-10-CM | POA: Diagnosis present

## 2016-09-02 DIAGNOSIS — N183 Chronic kidney disease, stage 3 (moderate): Secondary | ICD-10-CM | POA: Diagnosis not present

## 2016-09-02 DIAGNOSIS — R069 Unspecified abnormalities of breathing: Secondary | ICD-10-CM | POA: Diagnosis not present

## 2016-09-02 DIAGNOSIS — R0600 Dyspnea, unspecified: Secondary | ICD-10-CM

## 2016-09-02 DIAGNOSIS — N289 Disorder of kidney and ureter, unspecified: Secondary | ICD-10-CM | POA: Insufficient documentation

## 2016-09-02 DIAGNOSIS — I509 Heart failure, unspecified: Secondary | ICD-10-CM | POA: Insufficient documentation

## 2016-09-02 DIAGNOSIS — Z7901 Long term (current) use of anticoagulants: Secondary | ICD-10-CM | POA: Diagnosis not present

## 2016-09-02 DIAGNOSIS — I130 Hypertensive heart and chronic kidney disease with heart failure and stage 1 through stage 4 chronic kidney disease, or unspecified chronic kidney disease: Secondary | ICD-10-CM | POA: Diagnosis not present

## 2016-09-02 DIAGNOSIS — Z853 Personal history of malignant neoplasm of breast: Secondary | ICD-10-CM | POA: Insufficient documentation

## 2016-09-02 LAB — CBC WITH DIFFERENTIAL/PLATELET
Basophils Absolute: 0 10*3/uL (ref 0.0–0.1)
Basophils Relative: 0 %
Eosinophils Absolute: 0.1 10*3/uL (ref 0.0–0.7)
Eosinophils Relative: 1 %
HCT: 36.5 % (ref 36.0–46.0)
Hemoglobin: 12 g/dL (ref 12.0–15.0)
Lymphocytes Relative: 20 %
Lymphs Abs: 1.7 10*3/uL (ref 0.7–4.0)
MCH: 28.8 pg (ref 26.0–34.0)
MCHC: 32.9 g/dL (ref 30.0–36.0)
MCV: 87.5 fL (ref 78.0–100.0)
Monocytes Absolute: 0.7 10*3/uL (ref 0.1–1.0)
Monocytes Relative: 8 %
Neutro Abs: 5.8 10*3/uL (ref 1.7–7.7)
Neutrophils Relative %: 71 %
Platelets: 200 10*3/uL (ref 150–400)
RBC: 4.17 MIL/uL (ref 3.87–5.11)
RDW: 15.1 % (ref 11.5–15.5)
WBC: 8.3 10*3/uL (ref 4.0–10.5)

## 2016-09-02 LAB — URINALYSIS, ROUTINE W REFLEX MICROSCOPIC
Bilirubin Urine: NEGATIVE
Glucose, UA: NEGATIVE mg/dL
Hgb urine dipstick: NEGATIVE
Ketones, ur: NEGATIVE mg/dL
Nitrite: NEGATIVE
Protein, ur: NEGATIVE mg/dL
Specific Gravity, Urine: 1.012 (ref 1.005–1.030)
pH: 5 (ref 5.0–8.0)

## 2016-09-02 LAB — PROTIME-INR
INR: 1.86
Prothrombin Time: 21.7 seconds — ABNORMAL HIGH (ref 11.4–15.2)

## 2016-09-02 LAB — BASIC METABOLIC PANEL
Anion gap: 7 (ref 5–15)
BUN: 44 mg/dL — ABNORMAL HIGH (ref 6–20)
CO2: 26 mmol/L (ref 22–32)
Calcium: 8.4 mg/dL — ABNORMAL LOW (ref 8.9–10.3)
Chloride: 104 mmol/L (ref 101–111)
Creatinine, Ser: 1.71 mg/dL — ABNORMAL HIGH (ref 0.44–1.00)
GFR calc Af Amer: 31 mL/min — ABNORMAL LOW (ref >60)
GFR calc non Af Amer: 26 mL/min — ABNORMAL LOW (ref >60)
Glucose, Bld: 104 mg/dL — ABNORMAL HIGH (ref 65–99)
Potassium: 4.5 mmol/L (ref 3.5–5.1)
Sodium: 137 mmol/L (ref 135–145)

## 2016-09-02 LAB — DIGOXIN LEVEL: Digoxin Level: 0.6 ng/mL — ABNORMAL LOW (ref 0.8–2.0)

## 2016-09-02 LAB — BRAIN NATRIURETIC PEPTIDE: B Natriuretic Peptide: 110.8 pg/mL — ABNORMAL HIGH (ref 0.0–100.0)

## 2016-09-02 LAB — TROPONIN I: Troponin I: <0.03 ng/mL (ref <0.03)

## 2016-09-02 MED ORDER — NALOXONE HCL 2 MG/2ML IJ SOSY
PREFILLED_SYRINGE | INTRAMUSCULAR | Status: AC
  Filled 2016-09-02: qty 2

## 2016-09-02 NOTE — ED Triage Notes (Signed)
Pt from [place] via EMS with complaints of exertional SOB x 3 months. Pt has no complaints when sitting still, she has clear lung sounds. Pt is 91% RA but is 96% on 2L. Pt is bradycardic, and she has a history of afib. Pt denies changes or CP

## 2016-09-02 NOTE — ED Provider Notes (Signed)
[facility] DEPT Provider Note   CSN: [id number] Arrival date & time: 09/02/16  1733     History   Chief Complaint Chief Complaint  Patient presents with  . Shortness of Breath    HPI Tracy Rodriguez is a 83 y.o. female.  HPI Patient presents with shortness of breath. His had it for the last 3 months. No fevers. No chest pain. Does have some chronic swelling or legs. She is at assisted living and daughter states that since she mentions difficulty breathing but told her she had to come in the hospital. No abdominal pain. No fevers. Has had urinary frequency. Does have a history of some chronic shortness of breath. Does have a history of some bradycardia in the past also. History of paroxysmal A. fib. No recent medication changes.   Past Medical History:  Diagnosis Date  . Arthritis    "joints" (08/04/2014)  . Bipolar 1 disorder ([place])   . Cancer of right breast ([place])   . Chronic lower back pain   . Hyperlipemia   . Hypertension   . PAF (paroxysmal atrial fibrillation) [facility])     Patient Active Problem List   Diagnosis Date Noted  . VRE (vancomycin resistant enterococcus) culture positive 03/29/2016  . CHF (congestive heart failure) ([place]) 03/04/2016  . Anemia 03/04/2016  . Acute renal failure superimposed on stage 3 chronic kidney disease ([place]) 02/08/2016  . Elevated troponin 02/07/2016  . Tremor of right hand 02/07/2016  . Fall   . Right knee injury   . Syncope 12/05/2015  . Physical deconditioning 12/05/2015  . Dehydration, moderate 12/05/2015  . Abnormal chest x-ray 12/05/2015  . Encounter for therapeutic drug monitoring 12/22/2014  . Atrial fibrillation with RVR ([place]) 08/04/2014  . Paroxysmal atrial fibrillation ([place]) 08/04/2014  . Hypertensive urgency   . Obesity 09/27/2013  . Chronic back pain 09/14/2012  . Bipolar disorder ([place]) 09/14/2012    Past Surgical History:  Procedure Laterality Date  . BACK SURGERY    . BREAST BIOPSY Right   . BREAST  RECONSTRUCTION Right   . CATARACT EXTRACTION W/ INTRAOCULAR LENS  IMPLANT, BILATERAL Bilateral   . EXCISIONAL HEMORRHOIDECTOMY    . [place]; 1973; 1985   "ruptured discs each time"  . MASTECTOMY Right    cancer  . PLACEMENT OF BREAST IMPLANTS Bilateral    "had to take tissue out of left"  . TONSILLECTOMY    . VAGINAL HYSTERECTOMY      OB History    No data available       Home Medications    Prior to Admission medications   Medication Sig Start Date End Date Taking? Authorizing Provider  acetaminophen (TYLENOL) 325 MG tablet Take 2 tablets (650 mg total) by mouth every 6 (six) hours as needed for mild pain (or Fever >/= 101). 02/12/16  Yes Bonnielee Haff, MD  alum & mag hydroxide-simeth (MAALOX PLUS) 400-400-40 MG/5ML suspension Take 15 mLs by mouth as needed for indigestion.   Yes [provider]  atorvastatin (LIPITOR) 20 MG tablet Take 2 tablets (40 mg total) by mouth daily. 02/12/16  Yes Bonnielee Haff, MD  carvedilol (COREG) 6.25 MG tablet Take 1 tablet (6.25 mg total) by mouth 2 (two) times daily with a meal. 02/12/16  Yes Bonnielee Haff, MD  Cranberry-Vitamin C-Probiotic (AZO CRANBERRY) 250-30 MG TABS Take 1 tablet by mouth daily.   Yes [provider]  digoxin (LANOXIN) 0.125 MG tablet Take 0.125 mg by mouth daily.   Yes  [provider]  diltiazem (DILACOR XR) 120 MG 24 hr capsule Take 120 mg by mouth daily.   Yes [provider]  escitalopram (LEXAPRO) 20 MG tablet Take 20 mg by mouth daily.   Yes [provider]  ferrous sulfate 325 (65 FE) MG tablet Take 1 tablet (325 mg total) by mouth daily with breakfast. 05/23/16  Yes Marletta Lor, MD  furosemide (LASIX) 40 MG tablet Take 1 tablet (40 mg total) by mouth daily. 03/08/16  Yes Elgergawy, Silver Huguenin, MD  gabapentin (NEURONTIN) 300 MG capsule Take 300 mg by mouth 2 (two) times daily.   Yes [provider]  guaifenesin (ROBITUSSIN) 100 MG/5ML syrup  Take 200 mg by mouth every 6 (six) hours as needed for cough.   Yes [provider]  hydrALAZINE (APRESOLINE) 25 MG tablet Take 1 tablet (25 mg total) by mouth every 6 (six) hours. 03/08/16  Yes Elgergawy, Silver Huguenin, MD  loperamide (IMODIUM) 2 MG capsule Take 2 mg by mouth as needed for diarrhea or loose stools.   Yes [provider]  magnesium hydroxide (MILK OF MAGNESIA) 400 MG/5ML suspension Take 30 mLs by mouth daily as needed for mild constipation.   Yes [provider]  potassium chloride SA (K-DUR,KLOR-CON) 20 MEQ tablet Take 1 tablet (20 mEq total) by mouth daily. 02/12/16  Yes Bonnielee Haff, MD  Psyllium (METAMUCIL) WAFR Take 2 Wafers by mouth daily. Prn constipation 03/25/16  Yes Briscoe Deutscher, DO  QUEtiapine (SEROQUEL) 25 MG tablet Take 25 mg by mouth 2 (two) times daily.  02/15/16  Yes [provider]  sennosides-docusate sodium (SENOKOT-S) 8.6-50 MG tablet Take 1 tablet by mouth daily. 05/28/16  Yes Marletta Lor, MD  traMADol (ULTRAM) 50 MG tablet Take 1 tablet (50 mg total) by mouth every 6 (six) hours as needed for moderate pain. Patient taking differently: Take 50 mg by mouth 3 (three) times daily.  03/09/16  Yes Bonnielee Haff, MD  traZODone (DESYREL) 150 MG tablet Take 150 mg by mouth at bedtime 03/09/16  Yes Bonnielee Haff, MD  warfarin (COUMADIN) 1 MG tablet Take 1-4 mg by mouth daily. Take 5 MG every Monday and Thursday. Take 4 MG on Tuesday, Wednesday, Friday, Saturday and Sunday   Yes [provider]  cefUROXime (CEFTIN) 250 MG tablet Take 1 tablet (250 mg total) by mouth 2 (two) times daily with a meal. Patient not taking: Reported on 09/02/2016 03/22/16   Briscoe Deutscher, DO  escitalopram (LEXAPRO) 10 MG tablet TAKE 1 TABLET(10 MG) BY MOUTH DAILY Patient not taking: Reported on 09/02/2016 02/08/16   Marletta Lor, MD  nitrofurantoin, macrocrystal-monohydrate, (MACROBID) 100 MG capsule Take 1 capsule (100 mg total) by  mouth 2 (two) times daily. Patient not taking: Reported on 09/02/2016 03/29/16   Briscoe Deutscher, DO  polyethylene glycol powder (GLYCOLAX/MIRALAX) powder Take 17 g by mouth daily. Patient not taking: Reported on 09/02/2016 03/25/16   Briscoe Deutscher, DO    Family History Family History  Problem Relation Age of Onset  . Stroke Mother   . Heart disease Father   . Alzheimer's disease Sister   . Leukemia Brother   . Arthritis Maternal Grandmother   . Breast cancer Daughter     Social History Social History  Substance Use Topics  . Smoking status: Never Smoker  . Smokeless tobacco: Never Used  . Alcohol use No     Allergies   Codeine; Morphine and related; Valium [diazepam]; Sulfa antibiotics; Darvon [propoxyphene]; Floxin [ofloxacin];  Levsin [hyoscyamine sulfate]; Penicillins; and Zantac [ranitidine hcl]   Review of Systems Review of Systems  Constitutional: Negative for appetite change and fever.  HENT: Negative for congestion.   Respiratory: Positive for shortness of breath. Negative for stridor.   Cardiovascular: Positive for leg swelling.  Gastrointestinal: Negative for abdominal pain.  Genitourinary: Positive for dysuria.  Musculoskeletal: Negative for back pain.  Skin: Negative for rash.  Neurological: Negative for numbness.  Hematological: Negative for adenopathy.  Psychiatric/Behavioral: Negative for confusion.     Physical Exam Updated Vital Signs BP (!) 125/46   Pulse (!) 53   Temp 98.5 F (36.9 C) (Oral)   Resp (!) 21   LMP 10/10/1969   SpO2 95%   Physical Exam  Constitutional: She appears well-developed.  HENT:  Head: Atraumatic.  Eyes: Pupils are equal, round, and reactive to light.  Neck: Neck supple.  Cardiovascular:  Mild bradycardia  Pulmonary/Chest: Effort normal. She has no wheezes. She has no rales.  Abdominal: Soft. There is no tenderness.  Musculoskeletal: She exhibits edema.  edema to bilateral lower extremities with tenderness but  without erythema.  Skin: Skin is warm. Capillary refill takes less than 2 seconds.     ED Treatments / Results  Labs (all labs ordered are listed, but only abnormal results are displayed) Labs Reviewed  BASIC METABOLIC PANEL - Abnormal; Notable for the following:       Result Value   Glucose, Bld 104 (*)    BUN 44 (*)    Creatinine, Ser 1.71 (*)    Calcium 8.4 (*)    GFR calc non Af Amer 26 (*)    GFR calc Af Amer 31 (*)    All other components within normal limits  BRAIN NATRIURETIC PEPTIDE - Abnormal; Notable for the following:    B Natriuretic Peptide 110.8 (*)    All other components within normal limits  URINALYSIS, ROUTINE W REFLEX MICROSCOPIC - Abnormal; Notable for the following:    Leukocytes, UA TRACE (*)    Bacteria, UA RARE (*)    Squamous Epithelial / LPF 0-5 (*)    All other components within normal limits  DIGOXIN LEVEL - Abnormal; Notable for the following:    Digoxin Level 0.6 (*)    All other components within normal limits  PROTIME-INR - Abnormal; Notable for the following:    Prothrombin Time 21.7 (*)    All other components within normal limits  CBC WITH DIFFERENTIAL/PLATELET  TROPONIN I    EKG  EKG Interpretation  Date/Time:  Monday September 02 2016 21:31:04 EDT Ventricular Rate:  53 PR Interval:    QRS Duration: 100 QT Interval:  463 QTC Calculation: 435 R Axis:   31 Text Interpretation:  Sinus rhythm Low voltage, precordial leads Confirmed by Alvino Chapel  MD, Cru Kritikos [phone]) on 09/02/2016 9:36:35 PM       Radiology Dg Chest 2 View  Result Date: 09/02/2016 CLINICAL DATA:  Exertional shortness of breath EXAM: CHEST  2 VIEW COMPARISON:  03/06/2016, 04/05/2016 FINDINGS: Hyperinflation with left upper lobe scarring. No focal consolidation or large pleural effusion. Cardiomediastinal silhouette within normal limits. No pneumothorax. IMPRESSION: Hyperinflation.  No edema or infiltrate. Electronically Signed   By: Donavan Foil M.D.   On: 09/02/2016 22:00     Procedures Procedures (including critical care time)  Medications Ordered in ED Medications  naloxone [facility]) 2 MG/2ML injection (  Return to [facility] 09/02/16 2012)     Initial Impression / Assessment and Plan / ED Course  I have reviewed the triage vital signs and the nursing notes.  Pertinent labs & imaging results that were available during my care of the patient were reviewed by me and considered in my medical decision making (see chart for details).     Patient shortness of breath. Does not appear to be in CHF in fact may be mildly dehydrated. Has exertional shortness of breath. Taken off oxygen and not hypoxic. Lungs are clear. Has had chronic shortness of breath. Will follow-up with her primary care doctor.  Final Clinical Impressions(s) / ED Diagnoses   Final diagnoses:  Dyspnea, unspecified type  Renal insufficiency    New Prescriptions New Prescriptions   No medications on file     Davonna Belling, MD 09/02/16 [phone]

## 2016-09-02 NOTE — Discharge Instructions (Signed)
Follow-up with Dr. Raliegh Ip for further adjustments of medications.

## 2016-09-04 DIAGNOSIS — N390 Urinary tract infection, site not specified: Secondary | ICD-10-CM | POA: Diagnosis not present

## 2016-09-04 DIAGNOSIS — Z7901 Long term (current) use of anticoagulants: Secondary | ICD-10-CM | POA: Diagnosis not present

## 2016-09-04 DIAGNOSIS — I4891 Unspecified atrial fibrillation: Secondary | ICD-10-CM | POA: Diagnosis not present

## 2016-09-04 DIAGNOSIS — I480 Paroxysmal atrial fibrillation: Secondary | ICD-10-CM | POA: Diagnosis not present

## 2016-09-16 ENCOUNTER — Ambulatory Visit (INDEPENDENT_AMBULATORY_CARE_PROVIDER_SITE_OTHER): Payer: Medicare Other | Admitting: Acute Care

## 2016-09-16 ENCOUNTER — Other Ambulatory Visit (INDEPENDENT_AMBULATORY_CARE_PROVIDER_SITE_OTHER): Payer: Medicare Other

## 2016-09-16 ENCOUNTER — Encounter: Payer: Self-pay | Admitting: Acute Care

## 2016-09-16 VITALS — BP 100/62 | HR 54 | Ht 69.0 in | Wt 162.0 lb

## 2016-09-16 DIAGNOSIS — J988 Other specified respiratory disorders: Secondary | ICD-10-CM

## 2016-09-16 DIAGNOSIS — R5383 Other fatigue: Secondary | ICD-10-CM | POA: Insufficient documentation

## 2016-09-16 DIAGNOSIS — R0600 Dyspnea, unspecified: Secondary | ICD-10-CM

## 2016-09-16 DIAGNOSIS — R0602 Shortness of breath: Secondary | ICD-10-CM

## 2016-09-16 LAB — BASIC METABOLIC PANEL
BUN: 41 mg/dL — ABNORMAL HIGH (ref 6–23)
CO2: 26 mEq/L (ref 19–32)
Calcium: 8.8 mg/dL (ref 8.4–10.5)
Chloride: 104 mEq/L (ref 96–112)
Creatinine, Ser: 1.58 mg/dL — ABNORMAL HIGH (ref 0.40–1.20)
GFR: 33.16 mL/min — ABNORMAL LOW (ref >60.00)
Glucose, Bld: 105 mg/dL — ABNORMAL HIGH (ref 70–99)
Potassium: 4.8 mEq/L (ref 3.5–5.1)
Sodium: 138 mEq/L (ref 135–145)

## 2016-09-16 LAB — TSH: TSH: 3.1 u[IU]/mL (ref 0.35–4.50)

## 2016-09-16 MED ORDER — ALBUTEROL SULFATE (2.5 MG/3ML) 0.083% IN NEBU: 2.5000 mg | INHALATION_SOLUTION | Freq: Four times a day (QID) | RESPIRATORY_TRACT | 11 refills | Status: DC | PRN

## 2016-09-16 NOTE — Patient Instructions (Addendum)
We will do Spirometry today. Labs today.( BMET, TSH) We will call you with results. We will start you on Albuterol nebulizer treatments. We will use these twice daily as needed , try  in the morning and in the evening to start . Try to sit up straight as it improved expansion of the lungs. Follow up with Dr. Elsworth Soho / Sarah in 8 weeks to evaluate if any improvement. Please make appointment with PCP to evaluate medication list  for over sedation. Please contact office for sooner follow up if symptoms do not improve or worsen or seek emergency care

## 2016-09-16 NOTE — Assessment & Plan Note (Signed)
Pt with fatique/ flat affect and sleepiness in the office today. Plan: BMET to evaluate bicarb TSH FU with PCP to evaluate medications that may be contributing to sleepiness/ fatigue Follow up in 8 weeks as scheduled

## 2016-09-16 NOTE — Assessment & Plan Note (Addendum)
Pt. States she has had shortness of breath x 3 years Deconditioning/ posture  most likely contributing Spirometry in the office today indicates Severe airway obstruction with low vital capacity  Discussed with Dr. Melvyn Novas Plan: Will start prn albuterol nebs( doubt she will be able to handle inhalers) Initially start am and pm as needed Return in 8 weeks to evaluate effectiveness. Please contact office for sooner follow up if symptoms do not improve or worsen or seek emergency care

## 2016-09-16 NOTE — Progress Notes (Addendum)
History of Present Illness Tracy Rodriguez is a 83 y.o. female seen by Dr. Elsworth Soho for abnormal Ct scan 04/2016. She presents today for dyspnea.   09/17/2016 Follow Up Hopsital Visit: Pt. Went to the hospital on 09/02/2016 for SOB with exertion. She was not found to have any cardiac cause for the shortness of breath. Per the note no CHF.She presents today for continued dyspnea. She is very sleepy today in the office. She is slumped forward  in the chair and does not make eye contact.When asked questions she does not answer until you ask twice. Her grandson is with her today, and states she does have periods like this. I asked her to sit up straight to see if she could breath any better.She denies cough, secretions, fever, chest pain, orthopnea or hemoptysis.  Test Results:  BMET 09/16/2016>> CO2=26, TSH>>3.10 CXR 09/02/2016>> Hyperinflation.  No edema or infiltrate. Echo 03/06/2016>> estimated ejection fraction was in the range of 65% to 70%. Wall motion was normal; there were no regional wall motion abnormalities. Doppler parameters are consistent with abnormal left ventricular relaxation (grade 1 diastolic dysfunction).  CBC Latest Ref Rng & Units 09/02/2016 03/14/2016 03/08/2016  WBC 4.0 - 10.5 K/uL 8.3 6.1 7.4  Hemoglobin 12.0 - 15.0 g/dL 12.0 9.8(A) 9.4(L)  Hematocrit 36.0 - 46.0 % 36.5 32(A) 29.8(L)  Platelets 150 - 400 K/uL 200 357 232    BMP Latest Ref Rng & Units 09/16/2016 09/02/2016 03/08/2016  Glucose 70 - 99 mg/dL 105(H) 104(H) 92  BUN 6 - 23 mg/dL 41(H) 44(H) 11  Creatinine 0.40 - 1.20 mg/dL 1.58(H) 1.71(H) 1.34(H)  Sodium 135 - 145 mEq/L 138 137 141  Potassium 3.5 - 5.1 mEq/L 4.8 4.5 3.3(L)  Chloride 96 - 112 mEq/L 104 104 111  CO2 19 - 32 mEq/L 26 26 23   Calcium 8.4 - 10.5 mg/dL 8.8 8.4(L) 8.1(L)    BNP    Component Value Date/Time   BNP 110.8 (H) 09/02/2016 2201    ProBNP    Component Value Date/Time   PROBNP 276.0 (H) 01/16/2015 1411    PFT No results found for:  FEV1PRE, FEV1POST, FVCPRE, FVCPOST, TLC, DLCOUNC, PREFEV1FVCRT, PSTFEV1FVCRT  Dg Chest 2 View  Result Date: 09/02/2016 CLINICAL DATA:  Exertional shortness of breath EXAM: CHEST  2 VIEW COMPARISON:  03/06/2016, 04/05/2016 FINDINGS: Hyperinflation with left upper lobe scarring. No focal consolidation or large pleural effusion. Cardiomediastinal silhouette within normal limits. No pneumothorax. IMPRESSION: Hyperinflation.  No edema or infiltrate. Electronically Signed   By: Donavan Foil M.D.   On: 09/02/2016 22:00     Past medical hx Past Medical History:  Diagnosis Date  . Arthritis    "joints" (08/04/2014)  . Bipolar 1 disorder ([place])   . Cancer of right breast ([place])   . Chronic lower back pain   . Hyperlipemia   . Hypertension   . PAF (paroxysmal atrial fibrillation) ([place])      Social History  Substance Use Topics  . Smoking status: Never Smoker  . Smokeless tobacco: Never Used  . Alcohol use No    Tobacco Cessation: Never smoker  Past surgical hx, Family hx, Social hx all reviewed.  Current Outpatient Prescriptions on File Prior to Visit  Medication Sig  . acetaminophen (TYLENOL) 325 MG tablet Take 2 tablets (650 mg total) by mouth every 6 (six) hours as needed for mild pain (or Fever >/= 101).  Marland Kitchen alum & mag hydroxide-simeth (MAALOX PLUS) 400-400-40 MG/5ML suspension Take 15 mLs by mouth as needed  for indigestion.  Marland Kitchen atorvastatin (LIPITOR) 20 MG tablet Take 2 tablets (40 mg total) by mouth daily.  . carvedilol (COREG) 6.25 MG tablet Take 1 tablet (6.25 mg total) by mouth 2 (two) times daily with a meal.  . cefUROXime (CEFTIN) 250 MG tablet Take 1 tablet (250 mg total) by mouth 2 (two) times daily with a meal.  . Cranberry-Vitamin C-Probiotic (AZO CRANBERRY) 250-30 MG TABS Take 1 tablet by mouth daily.  . digoxin (LANOXIN) 0.125 MG tablet Take 0.125 mg by mouth daily.  Marland Kitchen diltiazem (DILACOR XR) 120 MG 24 hr capsule Take 120 mg by mouth daily.  Marland Kitchen escitalopram (LEXAPRO) 10 MG  tablet TAKE 1 TABLET(10 MG) BY MOUTH DAILY  . escitalopram (LEXAPRO) 20 MG tablet Take 20 mg by mouth daily.  . ferrous sulfate 325 (65 FE) MG tablet Take 1 tablet (325 mg total) by mouth daily with breakfast.  . furosemide (LASIX) 40 MG tablet Take 1 tablet (40 mg total) by mouth daily.  Marland Kitchen gabapentin (NEURONTIN) 300 MG capsule Take 300 mg by mouth 2 (two) times daily.  Marland Kitchen guaifenesin (ROBITUSSIN) 100 MG/5ML syrup Take 200 mg by mouth every 6 (six) hours as needed for cough.  . hydrALAZINE (APRESOLINE) 25 MG tablet Take 1 tablet (25 mg total) by mouth every 6 (six) hours.  Marland Kitchen loperamide (IMODIUM) 2 MG capsule Take 2 mg by mouth as needed for diarrhea or loose stools.  . magnesium hydroxide (MILK OF MAGNESIA) 400 MG/5ML suspension Take 30 mLs by mouth daily as needed for mild constipation.  . nitrofurantoin, macrocrystal-monohydrate, (MACROBID) 100 MG capsule Take 1 capsule (100 mg total) by mouth 2 (two) times daily.  . polyethylene glycol powder (GLYCOLAX/MIRALAX) powder Take 17 g by mouth daily.  . potassium chloride SA (K-DUR,KLOR-CON) 20 MEQ tablet Take 1 tablet (20 mEq total) by mouth daily.  . Psyllium (METAMUCIL) WAFR Take 2 Wafers by mouth daily. Prn constipation  . QUEtiapine (SEROQUEL) 25 MG tablet Take 25 mg by mouth 2 (two) times daily.   . sennosides-docusate sodium (SENOKOT-S) 8.6-50 MG tablet Take 1 tablet by mouth daily.  . traMADol (ULTRAM) 50 MG tablet Take 1 tablet (50 mg total) by mouth every 6 (six) hours as needed for moderate pain. (Patient taking differently: Take 50 mg by mouth 3 (three) times daily. )  . traZODone (DESYREL) 150 MG tablet Take 150 mg by mouth at bedtime  . warfarin (COUMADIN) 1 MG tablet Take 1-4 mg by mouth daily. Take 5 MG every Monday and Thursday. Take 4 MG on Tuesday, Wednesday, Friday, Saturday and Sunday   No current facility-administered medications on file prior to visit.      Allergies  Allergen Reactions  . Codeine Anaphylaxis and Nausea And  Vomiting    Note: pt has tramadol for years with no reaction  . Morphine And Related Nausea And Vomiting    Severe nausea per daughter  . Valium [Diazepam] Shortness Of Breath  . Sulfa Antibiotics Itching and Rash  . Darvon [Propoxyphene] Nausea And Vomiting  . Floxin [Ofloxacin] Itching  . Levsin [Hyoscyamine Sulfate] Other (See Comments)    Unknown reaction  . Penicillins Itching    Itching reported by [facility] Has patient had a PCN reaction causing immediate rash, facial/tongue/throat swelling, SOB or lightheadedness with hypotension: Yes Has patient had a PCN reaction causing severe rash involving mucus membranes or skin necrosis: No Has patient had a PCN reaction that required hospitalization No Has patient had a PCN reaction  occurring within the last 10 years: No If all of the above answers are "NO", then may proceed with Cephalosporin use.   . Zantac [Ranitidine Hcl] Other (See Comments)    Unknown reaction    Review Of Systems:  Constitutional:   No  weight loss, night sweats,  Fevers, chills, ++fatigue, or  lassitude.  HEENT:   No headaches,  Difficulty swallowing,  Tooth/dental problems, or  Sore throat,                No sneezing, itching, ear ache, nasal congestion, post nasal drip,   CV:  No chest pain,  Orthopnea, PND, swelling in lower extremities, anasarca, dizziness, palpitations, syncope.   GI  No heartburn, indigestion, abdominal pain, nausea, vomiting, diarrhea, change in bowel habits, loss of appetite, bloody stools.   Resp: + shortness of breath with exertion less at rest.  No excess mucus, no productive cough,  No non-productive cough,  No coughing up of blood.  No change in color of mucus.  No wheezing.  No chest wall deformity  Skin: no rash or lesions.  GU: no dysuria, change in color of urine, no urgency or frequency.  No flank pain, no hematuria   MS:  No joint pain or swelling.  No decreased range of motion.  No back  pain.  Psych:  No change in mood or affect. No depression or anxiety.  No memory loss.   Vital Signs BP 100/62 (BP Location: Left Arm, Cuff Size: Normal)   Pulse (!) 54   Ht 5\' 9"  (1.753 m)   Wt 162 lb (73.5 kg)   LMP 10/10/1969   SpO2 95%   BMI 23.92 kg/m    Physical Exam:  General- No distress,  A&Ox2, very sleepy and flat affect. ENT: No sinus tenderness, TM clear, pale nasal mucosa, no oral exudate,no post nasal drip, no LAN Cardiac: S1, S2, regular rate and rhythm, no murmur Chest: No wheeze/ rales/ dullness; no accessory muscle use, no nasal flaring, no sternal retractions, diminished per bases Abd.: Soft Non-tender, non-distended Ext: No clubbing cyanosis, edema Neuro:  Deconditioned at baseline, MAE x 4, A&O x 2, very groggy. Skin: No rashes, warm and dry Psych: flat affect, appears depressed.   Assessment/Plan  Dyspnea Pt. States she has had shortness of breath x 3 years Deconditioning/ posture  most likely contributing Spirometry in the office today indicates Severe airway obstruction with low vital capacity  Discussed with Dr. Melvyn Novas Plan: Will start prn albuterol nebs( doubt she will be able to handle inhalers) Initially start am and pm as needed Return in 8 weeks to evaluate effectiveness. Please contact office for sooner follow up if symptoms do not improve or worsen or seek emergency care     Fatigue Pt with fatique/ flat affect and sleepiness in the office today. Plan: BMET to evaluate bicarb TSH FU with PCP to evaluate medications that may be contributing to sleepiness/ fatigue Follow up in 8 weeks as scheduled  Airway obstruction Albuterol nebs prn SOB as patient most likely will not be able to manage hand held inhalers.     Magdalen Spatz, NP 09/17/2016  3:30 PM

## 2016-09-17 ENCOUNTER — Telehealth: Payer: Self-pay | Admitting: Acute Care

## 2016-09-17 DIAGNOSIS — J988 Other specified respiratory disorders: Secondary | ICD-10-CM

## 2016-09-17 NOTE — Telephone Encounter (Signed)
Please use severe airway obstruction per spirometry done 09/16/2016 thank you

## 2016-09-17 NOTE — Assessment & Plan Note (Signed)
Albuterol nebs prn SOB as patient most likely will not be able to manage hand held inhalers.

## 2016-09-17 NOTE — Telephone Encounter (Signed)
Staff message sent to [facility] @ [facility]

## 2016-09-17 NOTE — Telephone Encounter (Signed)
Tracy Rodriguez is aware that we cannot use Dyspnea as a diagnosis for these orders, its not an approved dx code bc it is classified as a symptom. Melissa dropped off a list of approved diagnosis codes that can be used.  Per Tracy Rodriguez will try severe airway obstruction.   Order placed again with new dx code  Will send to [facility] to ensure follow through

## 2016-09-17 NOTE — Telephone Encounter (Signed)
An order was placed for a nebulizer and there are no supporting diagnosis codes that can be used for insurance to cover this. Dyspnea is not a covered diagnosis. Please advise if there is another diagnosis code that can be used. Thanks.

## 2016-09-18 ENCOUNTER — Encounter: Payer: Self-pay | Admitting: Internal Medicine

## 2016-09-18 ENCOUNTER — Ambulatory Visit (INDEPENDENT_AMBULATORY_CARE_PROVIDER_SITE_OTHER): Payer: Medicare Other | Admitting: Internal Medicine

## 2016-09-18 VITALS — BP 102/58 | HR 59 | Temp 97.7°F | Ht 69.0 in | Wt 163.8 lb

## 2016-09-18 DIAGNOSIS — Z7901 Long term (current) use of anticoagulants: Secondary | ICD-10-CM | POA: Diagnosis not present

## 2016-09-18 DIAGNOSIS — I480 Paroxysmal atrial fibrillation: Secondary | ICD-10-CM

## 2016-09-18 DIAGNOSIS — F319 Bipolar disorder, unspecified: Secondary | ICD-10-CM

## 2016-09-18 DIAGNOSIS — I5020 Unspecified systolic (congestive) heart failure: Secondary | ICD-10-CM | POA: Diagnosis not present

## 2016-09-18 DIAGNOSIS — I4891 Unspecified atrial fibrillation: Secondary | ICD-10-CM | POA: Diagnosis not present

## 2016-09-18 DIAGNOSIS — R0600 Dyspnea, unspecified: Secondary | ICD-10-CM

## 2016-09-18 DIAGNOSIS — Z79899 Other long term (current) drug therapy: Secondary | ICD-10-CM | POA: Diagnosis not present

## 2016-09-18 DIAGNOSIS — N390 Urinary tract infection, site not specified: Secondary | ICD-10-CM | POA: Diagnosis not present

## 2016-09-18 MED ORDER — QUETIAPINE FUMARATE 25 MG PO TABS: 25.0000 mg | ORAL_TABLET | Freq: Every day | ORAL | 2 refills | Status: DC

## 2016-09-18 MED ORDER — FUROSEMIDE 20 MG PO TABS: 20.0000 mg | ORAL_TABLET | Freq: Every day | ORAL | Status: DC

## 2016-09-18 NOTE — Progress Notes (Signed)
Subjective:    Patient ID: Tracy Rodriguez, female    DOB: 1932-02-06, 83 y.o.   MRN: [id number]  HPI  83 year old patient who is a resident of [place] who has not been seen by me since the fall of last year.  She had a recent ED visit for shortness of breath and was seen also by pulmonary medicine.  Spirometry revealed a significant up structure defect.  The patient was not felt to be a candidate for albuterol MDI and a prescription for nebulized albuterol, dispensed.  Her grandson states that this is not a covered drug  A letter on their behalf was written justifying medical necessity.  The patient was noted to have some daytime somnolence by pulmonary.  Medical regimen reviewed and adjusted  The patient has a history of chronic back pain, insomnia and bipolar disorder and has been on a number of psychoactive medications  Past Medical History:  Diagnosis Date  . Arthritis    "joints" (08/04/2014)  . Bipolar 1 disorder ([place])   . Cancer of right breast ([place])   . Chronic lower back pain   . Hyperlipemia   . Hypertension   . PAF (paroxysmal atrial fibrillation) ([place])      Social History   Social History  . Marital status: Widowed    Spouse name: N/A  . Number of children: N/A  . Years of education: N/A   Occupational History  . Not on file.   Social History Main Topics  . Smoking status: Never Smoker  . Smokeless tobacco: Never Used  . Alcohol use No  . Drug use: No  . Sexual activity: No   Other Topics Concern  . Not on file   Social History Narrative  . No narrative on file    Past Surgical History:  Procedure Laterality Date  . BACK SURGERY    . BREAST BIOPSY Right   . BREAST RECONSTRUCTION Right   . CATARACT EXTRACTION W/ INTRAOCULAR LENS  IMPLANT, BILATERAL Bilateral   . EXCISIONAL HEMORRHOIDECTOMY    . [place]; 1973; 1985   "ruptured discs each time"  . MASTECTOMY Right    cancer  . PLACEMENT OF BREAST IMPLANTS Bilateral    "had to take tissue out of left"  . TONSILLECTOMY    . VAGINAL HYSTERECTOMY      Family History  Problem Relation Age of Onset  . Stroke Mother   . Heart disease Father   . Alzheimer's disease Sister   . Leukemia Brother   . Arthritis Maternal Grandmother   . Breast cancer Daughter     Allergies  Allergen Reactions  . Codeine Anaphylaxis and Nausea And Vomiting    Note: pt has tramadol for years with no reaction  . Morphine And Related Nausea And Vomiting    Severe nausea per daughter  . Valium [Diazepam] Shortness Of Breath  . Sulfa Antibiotics Itching and Rash  . Darvon [Propoxyphene] Nausea And Vomiting  . Floxin [Ofloxacin] Itching  . Levsin [Hyoscyamine Sulfate] Other (See Comments)    Unknown reaction  . Penicillins Itching    Itching reported by [facility] Has patient had a PCN reaction causing immediate rash, facial/tongue/throat swelling, SOB or lightheadedness with hypotension: Yes Has patient had a PCN reaction causing severe rash involving mucus membranes or skin necrosis: No Has patient had a PCN reaction that required hospitalization No Has patient had a PCN reaction occurring within the last 10 years: No If all  of the above answers are "NO", then may proceed with Cephalosporin use.   . Zantac [Ranitidine Hcl] Other (See Comments)    Unknown reaction    Current Outpatient Prescriptions on File Prior to Visit  Medication Sig Dispense Refill  . acetaminophen (TYLENOL) 325 MG tablet Take 2 tablets (650 mg total) by mouth every 6 (six) hours as needed for mild pain (or Fever >/= 101).    Marland Kitchen albuterol (PROVENTIL) (2.5 MG/3ML) 0.083% nebulizer solution Take 3 mLs (2.5 mg total) by nebulization every 6 (six) hours as needed for wheezing or shortness of breath. 120 mL 11  . alum & mag hydroxide-simeth (MAALOX PLUS) 400-400-40 MG/5ML suspension Take 15 mLs by mouth as needed for indigestion.    Marland Kitchen atorvastatin (LIPITOR) 20 MG tablet Take 2  tablets (40 mg total) by mouth daily. 90 tablet 3  . carvedilol (COREG) 6.25 MG tablet Take 1 tablet (6.25 mg total) by mouth 2 (two) times daily with a meal.    . Cranberry-Vitamin C-Probiotic (AZO CRANBERRY) 250-30 MG TABS Take 1 tablet by mouth daily.    . digoxin (LANOXIN) 0.125 MG tablet Take 0.125 mg by mouth daily.    Marland Kitchen diltiazem (DILACOR XR) 120 MG 24 hr capsule Take 120 mg by mouth daily.    Marland Kitchen escitalopram (LEXAPRO) 10 MG tablet TAKE 1 TABLET(10 MG) BY MOUTH DAILY 90 tablet 1  . guaifenesin (ROBITUSSIN) 100 MG/5ML syrup Take 200 mg by mouth every 6 (six) hours as needed for cough.    . magnesium hydroxide (MILK OF MAGNESIA) 400 MG/5ML suspension Take 30 mLs by mouth daily as needed for mild constipation.    . polyethylene glycol powder (GLYCOLAX/MIRALAX) powder Take 17 g by mouth daily. 3350 g 1  . potassium chloride SA (K-DUR,KLOR-CON) 20 MEQ tablet Take 1 tablet (20 mEq total) by mouth daily.    . Psyllium (METAMUCIL) WAFR Take 2 Wafers by mouth daily. Prn constipation 24 Wafer 6  . sennosides-docusate sodium (SENOKOT-S) 8.6-50 MG tablet Take 1 tablet by mouth daily. 30 tablet 11  . traMADol (ULTRAM) 50 MG tablet Take 1 tablet (50 mg total) by mouth every 6 (six) hours as needed for moderate pain. (Patient taking differently: Take 50 mg by mouth 3 (three) times daily. ) 20 tablet 0  . warfarin (COUMADIN) 1 MG tablet Take 1-4 mg by mouth daily. Take 5 MG every Monday and Thursday. Take 4 MG on Tuesday, Wednesday, Friday, Saturday and Sunday     No current facility-administered medications on file prior to visit.     BP (!) 102/58 (BP Location: Left Arm, Patient Position: Sitting, Cuff Size: Normal)   Pulse (!) 59   Temp 97.7 F (36.5 C) (Oral)   Ht 5\' 9"  (1.753 m)   Wt 163 lb 12.8 oz (74.3 kg)   LMP 10/10/1969   SpO2 97%   BMI 24.19 kg/m     Review of Systems  Constitutional: Negative.   HENT: Negative for congestion, dental problem, hearing loss, rhinorrhea, sinus  pressure, sore throat and tinnitus.   Eyes: Negative for pain, discharge and visual disturbance.  Respiratory: Positive for shortness of breath. Negative for cough.   Cardiovascular: Negative for chest pain, palpitations and leg swelling.  Gastrointestinal: Negative for abdominal distention, abdominal pain, blood in stool, constipation, diarrhea, nausea and vomiting.  Genitourinary: Negative for difficulty urinating, dysuria, flank pain, frequency, hematuria, pelvic pain, urgency, vaginal bleeding, vaginal discharge and vaginal pain.  Musculoskeletal: Positive for back pain. Negative for arthralgias, gait problem  and joint swelling.  Skin: Negative for rash.  Neurological: Negative for dizziness, syncope, speech difficulty, weakness, numbness and headaches.  Hematological: Negative for adenopathy.  Psychiatric/Behavioral: Positive for confusion, decreased concentration and sleep disturbance. Negative for agitation, behavioral problems and dysphoric mood. The patient is nervous/anxious.        Objective:   Physical Exam  Constitutional: She is oriented to person, place, and time. She appears well-developed and well-nourished. No distress.  Patient tends to sit quietly with her head slumped forward Arouses to verbal stimuli but appears sedated  Vital signs stable Blood pressure low normal O2 saturation 97%  Pulse rate 60  HENT:  Head: Normocephalic.  Right Ear: External ear normal.  Left Ear: External ear normal.  Mouth/Throat: Oropharynx is clear and moist.  Eyes: Conjunctivae and EOM are normal. Pupils are equal, round, and reactive to light.  Neck: Normal range of motion. Neck supple. No thyromegaly present.  Cardiovascular: Normal rate, regular rhythm, normal heart sounds and intact distal pulses.   Pulmonary/Chest: Effort normal and breath sounds normal.  Chest is clear  Abdominal: Soft. Bowel sounds are normal. She exhibits no mass. There is no tenderness.  Musculoskeletal:  Normal range of motion. She exhibits no edema.  Lymphadenopathy:    She has no cervical adenopathy.  Neurological: She is alert and oriented to person, place, and time.  Skin: Skin is warm and dry. No rash noted.  Psychiatric: She has a normal mood and affect. Her behavior is normal.          Assessment & Plan:   Daytime somnolence.  Multiple medications were discontinued or down titrated.  These include trazodone and Neurontin Congestive heart failure.  Compensated.  We'll decrease furosemide to 20 mg daily.  Creatinine was elevated in the ED last week Bipolar disorder.  We'll decrease Seroquel to a bedtime dose only History of anemia.  Recent CBC normal.  Discontinue iron therapy History congestive heart failure and hypertensive urgency  Apresoline will be discontinued.  Continue other antihypertensive medications  Follow-up one month  Tenley Winward [name]

## 2016-09-18 NOTE — Patient Instructions (Signed)
Note the following changes in medical regimen"  Decrease furosemide to 20 mg daily Discontinue trazodone Decrease Lexapro from 30 mg to 10 mg Discontinue Apresoline Discontinue iron sulfate Discontinue Neurontin Decrease Seroquel to 25 mg at bedtime only  Return in 4 weeks for follow-up

## 2016-09-20 ENCOUNTER — Telehealth: Payer: Self-pay

## 2016-09-20 ENCOUNTER — Telehealth: Payer: Self-pay | Admitting: Acute Care

## 2016-09-20 NOTE — Telephone Encounter (Signed)
Called Medicare and was advised that I would have to call her supplemental insurance.   I called [employer] and was advised that they do not do the medication covereage and that this would have to come [facility].  [facility] and was on hold for 10 minutes.  Hung up and called [facility] [place]---this is the pharmacy that delivers her medications to the facility and they pulled up her information and the insurance does not want to cover the cost of the albuterol for her nebulizer.  We will need to change this to something else.  SG please advise. Thanks

## 2016-09-20 NOTE — Telephone Encounter (Signed)
ATC and was put on hold for 5 min  [facility] Monday 7/16

## 2016-09-20 NOTE — Telephone Encounter (Signed)
Cindy @ [facility] states that they must have written orders for any changes to patient's medications or care. The AVS is not acceptable. Please send over written orders for all changes and orders must be signed by physician.

## 2016-09-20 NOTE — Telephone Encounter (Signed)
Offer her Pro Air or another rescue inhaler with a spacer. Someone will have to teach her  how to use it. Thanks so much for trying with the insurance company.

## 2016-09-20 NOTE — Telephone Encounter (Signed)
Orders faxed to [facility] at [phone]

## 2016-09-20 NOTE — Telephone Encounter (Signed)
Do we know why it was denied by insurance? Was it due to diagnosis that we could perhaps change to get it covered?

## 2016-09-20 NOTE — Telephone Encounter (Signed)
Called and spoke with Jenny Reichmann and she stated that her insurance will not cover the albuterol nebulizer and they do not have an alternative for this.  They stated that if we send anything in to replace the albuterol, please send this to [facility].  SG please advise. Thanks

## 2016-09-23 ENCOUNTER — Telehealth: Payer: Self-pay | Admitting: Internal Medicine

## 2016-09-23 DIAGNOSIS — I10 Essential (primary) hypertension: Secondary | ICD-10-CM | POA: Diagnosis not present

## 2016-09-23 DIAGNOSIS — M25521 Pain in right elbow: Secondary | ICD-10-CM | POA: Diagnosis not present

## 2016-09-23 DIAGNOSIS — I480 Paroxysmal atrial fibrillation: Secondary | ICD-10-CM | POA: Diagnosis not present

## 2016-09-23 DIAGNOSIS — Z79899 Other long term (current) drug therapy: Secondary | ICD-10-CM | POA: Diagnosis not present

## 2016-09-23 NOTE — Telephone Encounter (Signed)
Please advise 

## 2016-09-23 NOTE — Telephone Encounter (Signed)
Pts daughter would like to have the pt put back on gabapentin if the pt was taking off she is having a lot of problems sleeping and anxiety.  Pts daughter would greatly appreciate it if it could happen as soon as possible.  If you need to speak with the daughter you can reach her at the above number.

## 2016-09-24 NOTE — Telephone Encounter (Signed)
Okay to resume Neurontin

## 2016-09-25 DIAGNOSIS — Z7901 Long term (current) use of anticoagulants: Secondary | ICD-10-CM | POA: Diagnosis not present

## 2016-09-25 DIAGNOSIS — Z79899 Other long term (current) drug therapy: Secondary | ICD-10-CM | POA: Diagnosis not present

## 2016-09-25 DIAGNOSIS — R319 Hematuria, unspecified: Secondary | ICD-10-CM | POA: Diagnosis not present

## 2016-09-25 DIAGNOSIS — N390 Urinary tract infection, site not specified: Secondary | ICD-10-CM | POA: Diagnosis not present

## 2016-09-25 DIAGNOSIS — I480 Paroxysmal atrial fibrillation: Secondary | ICD-10-CM | POA: Diagnosis not present

## 2016-09-25 NOTE — Telephone Encounter (Signed)
LMTCB for daughter 

## 2016-09-30 DIAGNOSIS — R42 Dizziness and giddiness: Secondary | ICD-10-CM | POA: Diagnosis not present

## 2016-09-30 DIAGNOSIS — I480 Paroxysmal atrial fibrillation: Secondary | ICD-10-CM | POA: Diagnosis not present

## 2016-09-30 DIAGNOSIS — Z79899 Other long term (current) drug therapy: Secondary | ICD-10-CM | POA: Diagnosis not present

## 2016-09-30 DIAGNOSIS — I10 Essential (primary) hypertension: Secondary | ICD-10-CM | POA: Diagnosis not present

## 2016-10-02 DIAGNOSIS — Z7901 Long term (current) use of anticoagulants: Secondary | ICD-10-CM | POA: Diagnosis not present

## 2016-10-02 DIAGNOSIS — I4891 Unspecified atrial fibrillation: Secondary | ICD-10-CM | POA: Diagnosis not present

## 2016-10-03 ENCOUNTER — Inpatient Hospital Stay (HOSPITAL_COMMUNITY)
Admission: EM | Admit: 2016-10-03 | Discharge: 2016-10-05 | DRG: 312 | Disposition: A | Discharge status: Skilled Nursing Facility | Payer: Medicare Other | Attending: Internal Medicine | Admitting: Internal Medicine

## 2016-10-03 ENCOUNTER — Emergency Department (HOSPITAL_COMMUNITY): Payer: Medicare Other

## 2016-10-03 ENCOUNTER — Encounter (HOSPITAL_COMMUNITY): Payer: Self-pay

## 2016-10-03 ENCOUNTER — Observation Stay (HOSPITAL_COMMUNITY): Payer: Medicare Other

## 2016-10-03 DIAGNOSIS — N289 Disorder of kidney and ureter, unspecified: Secondary | ICD-10-CM | POA: Diagnosis not present

## 2016-10-03 DIAGNOSIS — I5032 Chronic diastolic (congestive) heart failure: Secondary | ICD-10-CM | POA: Diagnosis present

## 2016-10-03 DIAGNOSIS — Z66 Do not resuscitate: Secondary | ICD-10-CM | POA: Diagnosis not present

## 2016-10-03 DIAGNOSIS — Z888 Allergy status to other drugs, medicaments and biological substances status: Secondary | ICD-10-CM

## 2016-10-03 DIAGNOSIS — W19XXXA Unspecified fall, initial encounter: Secondary | ICD-10-CM

## 2016-10-03 DIAGNOSIS — G8929 Other chronic pain: Secondary | ICD-10-CM | POA: Diagnosis present

## 2016-10-03 DIAGNOSIS — R55 Syncope and collapse: Secondary | ICD-10-CM | POA: Diagnosis not present

## 2016-10-03 DIAGNOSIS — F0390 Unspecified dementia without behavioral disturbance: Secondary | ICD-10-CM | POA: Diagnosis not present

## 2016-10-03 DIAGNOSIS — I951 Orthostatic hypotension: Secondary | ICD-10-CM | POA: Diagnosis not present

## 2016-10-03 DIAGNOSIS — R791 Abnormal coagulation profile: Secondary | ICD-10-CM | POA: Diagnosis present

## 2016-10-03 DIAGNOSIS — M545 Low back pain: Secondary | ICD-10-CM | POA: Diagnosis present

## 2016-10-03 DIAGNOSIS — Z9882 Breast implant status: Secondary | ICD-10-CM

## 2016-10-03 DIAGNOSIS — Z882 Allergy status to sulfonamides status: Secondary | ICD-10-CM

## 2016-10-03 DIAGNOSIS — E860 Dehydration: Secondary | ICD-10-CM | POA: Diagnosis not present

## 2016-10-03 DIAGNOSIS — R001 Bradycardia, unspecified: Secondary | ICD-10-CM | POA: Insufficient documentation

## 2016-10-03 DIAGNOSIS — I130 Hypertensive heart and chronic kidney disease with heart failure and stage 1 through stage 4 chronic kidney disease, or unspecified chronic kidney disease: Secondary | ICD-10-CM | POA: Diagnosis present

## 2016-10-03 DIAGNOSIS — Z885 Allergy status to narcotic agent status: Secondary | ICD-10-CM

## 2016-10-03 DIAGNOSIS — N183 Chronic kidney disease, stage 3 unspecified: Secondary | ICD-10-CM | POA: Diagnosis present

## 2016-10-03 DIAGNOSIS — K580 Irritable bowel syndrome with diarrhea: Secondary | ICD-10-CM | POA: Diagnosis present

## 2016-10-03 DIAGNOSIS — Z880 Allergy status to penicillin: Secondary | ICD-10-CM

## 2016-10-03 DIAGNOSIS — I480 Paroxysmal atrial fibrillation: Secondary | ICD-10-CM | POA: Diagnosis not present

## 2016-10-03 DIAGNOSIS — F458 Other somatoform disorders: Secondary | ICD-10-CM | POA: Diagnosis present

## 2016-10-03 DIAGNOSIS — I10 Essential (primary) hypertension: Secondary | ICD-10-CM | POA: Diagnosis present

## 2016-10-03 DIAGNOSIS — Z79899 Other long term (current) drug therapy: Secondary | ICD-10-CM

## 2016-10-03 DIAGNOSIS — E785 Hyperlipidemia, unspecified: Secondary | ICD-10-CM | POA: Diagnosis present

## 2016-10-03 DIAGNOSIS — D638 Anemia in other chronic diseases classified elsewhere: Secondary | ICD-10-CM | POA: Diagnosis present

## 2016-10-03 DIAGNOSIS — F319 Bipolar disorder, unspecified: Secondary | ICD-10-CM | POA: Diagnosis present

## 2016-10-03 DIAGNOSIS — Z8249 Family history of ischemic heart disease and other diseases of the circulatory system: Secondary | ICD-10-CM

## 2016-10-03 DIAGNOSIS — K5900 Constipation, unspecified: Secondary | ICD-10-CM

## 2016-10-03 DIAGNOSIS — R197 Diarrhea, unspecified: Secondary | ICD-10-CM | POA: Diagnosis not present

## 2016-10-03 DIAGNOSIS — Z7901 Long term (current) use of anticoagulants: Secondary | ICD-10-CM

## 2016-10-03 DIAGNOSIS — R402441 Other coma, without documented Glasgow coma scale score, or with partial score reported, in the field [EMT or ambulance]: Secondary | ICD-10-CM | POA: Diagnosis not present

## 2016-10-03 HISTORY — DX: Heart failure, unspecified: I50.9

## 2016-10-03 LAB — CBC
HCT: 40.8 % (ref 36.0–46.0)
Hemoglobin: 13.1 g/dL (ref 12.0–15.0)
MCH: 28.7 pg (ref 26.0–34.0)
MCHC: 32.1 g/dL (ref 30.0–36.0)
MCV: 89.3 fL (ref 78.0–100.0)
Platelets: 202 10*3/uL (ref 150–400)
RBC: 4.57 MIL/uL (ref 3.87–5.11)
RDW: 15.2 % (ref 11.5–15.5)
WBC: 10.3 10*3/uL (ref 4.0–10.5)

## 2016-10-03 LAB — URINALYSIS, ROUTINE W REFLEX MICROSCOPIC
Bilirubin Urine: NEGATIVE
Glucose, UA: NEGATIVE mg/dL
Hgb urine dipstick: NEGATIVE
Ketones, ur: NEGATIVE mg/dL
Leukocytes, UA: NEGATIVE
Nitrite: NEGATIVE
Protein, ur: NEGATIVE mg/dL
Specific Gravity, Urine: 1.008 (ref 1.005–1.030)
pH: 6 (ref 5.0–8.0)

## 2016-10-03 LAB — BASIC METABOLIC PANEL
Anion gap: 9 (ref 5–15)
BUN: 27 mg/dL — ABNORMAL HIGH (ref 6–20)
CO2: 21 mmol/L — ABNORMAL LOW (ref 22–32)
Calcium: 8.8 mg/dL — ABNORMAL LOW (ref 8.9–10.3)
Chloride: 110 mmol/L (ref 101–111)
Creatinine, Ser: 1.79 mg/dL — ABNORMAL HIGH (ref 0.44–1.00)
GFR calc Af Amer: 29 mL/min — ABNORMAL LOW (ref >60)
GFR calc non Af Amer: 25 mL/min — ABNORMAL LOW (ref >60)
Glucose, Bld: 120 mg/dL — ABNORMAL HIGH (ref 65–99)
Potassium: 5.1 mmol/L (ref 3.5–5.1)
Sodium: 140 mmol/L (ref 135–145)

## 2016-10-03 LAB — PROTIME-INR
INR: 6.92
INR: 5.34
Prothrombin Time: 62 seconds — ABNORMAL HIGH (ref 11.4–15.2)
Prothrombin Time: 50.4 seconds — ABNORMAL HIGH (ref 11.4–15.2)

## 2016-10-03 LAB — PREALBUMIN: Prealbumin: 18.3 mg/dL (ref 18–38)

## 2016-10-03 LAB — MAGNESIUM: Magnesium: 2.1 mg/dL (ref 1.7–2.4)

## 2016-10-03 LAB — I-STAT TROPONIN, ED: Troponin i, poc: 0.05 ng/mL (ref 0.00–0.08)

## 2016-10-03 LAB — MRSA PCR SCREENING: MRSA by PCR: NEGATIVE

## 2016-10-03 LAB — DIGOXIN LEVEL: Digoxin Level: 1.2 ng/mL (ref 0.8–2.0)

## 2016-10-03 MED ORDER — ACETAMINOPHEN 325 MG PO TABS: 650.0000 mg | ORAL_TABLET | Freq: Four times a day (QID) | ORAL | Status: DC | PRN

## 2016-10-03 MED ORDER — ACETAMINOPHEN 650 MG RE SUPP: 650.0000 mg | Freq: Four times a day (QID) | RECTAL | Status: DC | PRN

## 2016-10-03 MED ORDER — ATORVASTATIN CALCIUM 40 MG PO TABS
40.0000 mg | ORAL_TABLET | Freq: Every day | ORAL | Status: DC
  Administered 2016-10-04 – 2016-10-05 (×2): 40 mg via ORAL
  Filled 2016-10-03 (×2): qty 1

## 2016-10-03 MED ORDER — DILTIAZEM HCL ER COATED BEADS 120 MG PO CP24
120.0000 mg | ORAL_CAPSULE | Freq: Every day | ORAL | Status: DC
  Administered 2016-10-04: 120 mg via ORAL
  Filled 2016-10-03 (×3): qty 1

## 2016-10-03 MED ORDER — GABAPENTIN 100 MG PO CAPS
100.0000 mg | ORAL_CAPSULE | Freq: Every day | ORAL | Status: DC
  Administered 2016-10-03 – 2016-10-04 (×2): 100 mg via ORAL
  Filled 2016-10-03 (×2): qty 1

## 2016-10-03 MED ORDER — PHYTONADIONE 1 MG/0.5 ML ORAL SOLUTION
1.0000 mg | Freq: Once | ORAL | Status: AC
  Administered 2016-10-03: 1 mg via ORAL
  Filled 2016-10-03: qty 0.5

## 2016-10-03 MED ORDER — ESCITALOPRAM OXALATE 10 MG PO TABS
10.0000 mg | ORAL_TABLET | Freq: Every day | ORAL | Status: DC
  Administered 2016-10-03 – 2016-10-04 (×2): 10 mg via ORAL
  Filled 2016-10-03 (×2): qty 1

## 2016-10-03 MED ORDER — ONDANSETRON HCL 4 MG/2ML IJ SOLN: 4.0000 mg | Freq: Four times a day (QID) | INTRAMUSCULAR | Status: DC | PRN

## 2016-10-03 MED ORDER — SODIUM CHLORIDE 0.9 % IV SOLN
INTRAVENOUS | Status: DC
  Administered 2016-10-03 (×2): via INTRAVENOUS
  Administered 2016-10-04: 50 mL/h via INTRAVENOUS
  Administered 2016-10-04: 75 mL/h via INTRAVENOUS

## 2016-10-03 MED ORDER — AZO CRANBERRY 250-30 MG PO TABS: 1.0000 | ORAL_TABLET | Freq: Every day | ORAL | Status: DC

## 2016-10-03 MED ORDER — TRAMADOL HCL 50 MG PO TABS
50.0000 mg | ORAL_TABLET | Freq: Four times a day (QID) | ORAL | Status: DC | PRN
  Administered 2016-10-03 – 2016-10-05 (×2): 50 mg via ORAL
  Filled 2016-10-03 (×2): qty 1

## 2016-10-03 MED ORDER — SODIUM CHLORIDE 0.9 % IV BOLUS (SEPSIS)
500.0000 mL | Freq: Once | INTRAVENOUS | Status: AC
  Administered 2016-10-03: 500 mL via INTRAVENOUS

## 2016-10-03 MED ORDER — TRAZODONE HCL 150 MG PO TABS
150.0000 mg | ORAL_TABLET | Freq: Every day | ORAL | Status: DC
  Administered 2016-10-03 – 2016-10-04 (×2): 150 mg via ORAL
  Filled 2016-10-03 (×2): qty 1

## 2016-10-03 MED ORDER — ALUM & MAG HYDROXIDE-SIMETH 200-200-20 MG/5ML PO SUSP: 30.0000 mL | ORAL | Status: DC | PRN

## 2016-10-03 MED ORDER — ONDANSETRON HCL 4 MG PO TABS: 4.0000 mg | ORAL_TABLET | Freq: Four times a day (QID) | ORAL | Status: DC | PRN

## 2016-10-03 MED ORDER — ALBUTEROL SULFATE (2.5 MG/3ML) 0.083% IN NEBU: 2.5000 mg | INHALATION_SOLUTION | Freq: Four times a day (QID) | RESPIRATORY_TRACT | Status: DC | PRN

## 2016-10-03 MED ORDER — MELATONIN 3 MG PO TABS
3.0000 mg | ORAL_TABLET | Freq: Every day | ORAL | Status: DC
  Administered 2016-10-03 – 2016-10-04 (×2): 3 mg via ORAL
  Filled 2016-10-03 (×2): qty 1

## 2016-10-03 MED ORDER — SODIUM CHLORIDE 0.9% FLUSH: 3.0000 mL | Freq: Two times a day (BID) | INTRAVENOUS | Status: DC

## 2016-10-03 MED ORDER — QUETIAPINE FUMARATE 25 MG PO TABS
25.0000 mg | ORAL_TABLET | Freq: Every day | ORAL | Status: DC
  Administered 2016-10-03 – 2016-10-04 (×2): 25 mg via ORAL
  Filled 2016-10-03 (×2): qty 1

## 2016-10-03 MED ORDER — ENSURE ENLIVE PO LIQD: 237.0000 mL | Freq: Two times a day (BID) | ORAL | Status: DC

## 2016-10-03 MED ORDER — TRAMADOL HCL 50 MG PO TABS
50.0000 mg | ORAL_TABLET | Freq: Once | ORAL | Status: AC
  Administered 2016-10-03: 50 mg via ORAL
  Filled 2016-10-03: qty 1

## 2016-10-03 MED ORDER — MAGNESIUM HYDROXIDE 400 MG/5ML PO SUSP: 30.0000 mL | Freq: Every day | ORAL | Status: DC | PRN

## 2016-10-03 MED ORDER — LIDOCAINE 5 % EX PTCH
1.0000 | MEDICATED_PATCH | CUTANEOUS | Status: DC
  Administered 2016-10-03 – 2016-10-04 (×2): 1 via TRANSDERMAL
  Filled 2016-10-03 (×2): qty 1

## 2016-10-03 NOTE — ED Notes (Signed)
Pt transported to CT ?

## 2016-10-03 NOTE — H&P (Signed)
History and Physical    Tracy Rodriguez ZDG:[id number] DOB: 07/10/1931 DOA: 10/03/2016   PCP: Sande Brothers, MD   Attending physician: Marily Memos  Patient coming from/Resides with: SNF/[name]  Chief Complaint: Syncope 2  HPI: Tracy Rodriguez is a 83 y.o. female with medical history significant for mild dementia, PAF on warfarin, hypertension, chronic diastolic heart failure without chronic lower extremity edema, arthritis and chronic low back pain, IBS (constipation alternating w/ diarrhea), bipolar disorder and remote history of right breast cancer. Patient was at nursing facility today when she had witnessed syncopal events 2 without loss of consciousness. Patient has a history of constipation alternating w/ loose stools. In the past 3 days her stools have been looser than normal but not watery. She did not have any nausea or vomiting prior to onset of syncopal symptoms. During one of the episode she was sitting on the toilet but was not straining. She is not having any abdominal pain. She has been fatigued in several months. She was evaluated by Dr. Burnice Logan on 7/11 felt her fatigue symptoms may have been coming from her psychotropic medications so several medications were either discontinued or dosages decreased. Unfortunately after about 2 days patient had worsening neuropsychiatric symptoms and was unable to sleep at night prompting resumption of previous medications. In addition the patient apparently has been having some chronic dyspnea and underwent spirometry studies that were abnormal and consistent with functional dyspnea/airway obstruction felt to be related to patient's body habitus and positioning. Patient was initially started on albuterol nebulizers due to concerns she may not be able to follow instructions regarding MDI but this medication was was denied payment by insurance company and subsequently pulmonologist has ordered pro-air inhaler.  Today in the ER upon initial  arrival patient's heart rate was in the high 40s noting she typically has a heart rate in the mid to upper 50s. Renal function was stable but she did appear somewhat clinically dehydrated and was given fluids by the EDP. Her INR was found to be supratherapeutic at 6.92. Because of the bradycardia digoxin level was ordered but has not yet resulted. Chin's primary complaint is of back pain that radiates through to the front part of her abdomen which is a chronic issue.  ED Course:  Vital Signs: BP (!) 159/68   Pulse (!) 51   Temp 97.7 F (36.5 C)   Resp 15   Ht 5\' 9"  (1.753 m)   Wt 73.9 kg (163 lb)   LMP 10/10/1969   SpO2 99%   BMI 24.07 kg/m  2 view CXR: Neg CT head: No acute intracranial abnormality Lab data: Sodium 140, potassium 5.1, chloride 110, CO2 21, glucose 120, BUN 27, creatinine 1.79, calcium 8.8, anion gap 9, white count 10,300 differential not obtained, hemoglobin 13.1, platelets 202,000; urinalysis unremarkable Medications and treatments: Ultram 50 mg 1, normal saline bolus 500 mL, vitamin K 1 mg PO 1.  Review of Systems:  In addition to the HPI above,  No Fever-chills, myalgias or other constitutional symptoms No Headache, changes with Vision or hearing, new weakness, tingling, numbness in any extremity, dizziness, dysarthria or word finding difficulty, tremors or seizure activity No problems swallowing food or Liquids, indigestion/reflux, choking or coughing while eating, abdominal pain with or after eating No Chest pain, Cough or Shortness of Breath, palpitations, orthopnea or DOE No Abdominal pain, N/V, melena,hematochezia, dark tarry stools, constipation No dysuria, malodorous urine, hematuria or flank pain No new skin rashes, lesions, masses or bruises, No new joint pains,  aches, swelling or redness No recent unintentional weight gain or loss No polyuria, polydypsia or polyphagia   Past Medical History:  Diagnosis Date  . Arthritis    "joints" (08/04/2014)  .  Bipolar 1 disorder ([place])   . Cancer of right breast ([place])   . CHF (congestive heart failure) ([place])   . Chronic lower back pain   . Hyperlipemia   . Hypertension   . PAF (paroxysmal atrial fibrillation) ([place])     Past Surgical History:  Procedure Laterality Date  . BACK SURGERY    . BREAST BIOPSY Right   . BREAST RECONSTRUCTION Right   . CATARACT EXTRACTION W/ INTRAOCULAR LENS  IMPLANT, BILATERAL Bilateral   . EXCISIONAL HEMORRHOIDECTOMY    . [place]; 1973; 1985   "ruptured discs each time"  . MASTECTOMY Right    cancer  . PLACEMENT OF BREAST IMPLANTS Bilateral    "had to take tissue out of left"  . TONSILLECTOMY    . VAGINAL HYSTERECTOMY      Social History   Social History  . Marital status: Widowed    Spouse name: N/A  . Number of children: N/A  . Years of education: N/A   Occupational History  . Not on file.   Social History Main Topics  . Smoking status: Never Smoker  . Smokeless tobacco: Never Used  . Alcohol use No  . Drug use: No  . Sexual activity: No   Other Topics Concern  . Not on file   Social History Narrative  . No narrative on file    Mobility: Rolling walker Work history: Not obtained   Allergies  Allergen Reactions  . Codeine Anaphylaxis and Nausea And Vomiting    Note: pt has tramadol for years with no reaction  . Morphine And Related Nausea And Vomiting    Severe nausea per daughter  . Valium [Diazepam] Shortness Of Breath  . Sulfa Antibiotics Itching and Rash  . Darvon [Propoxyphene] Nausea And Vomiting  . Floxin [Ofloxacin] Itching  . Levsin [Hyoscyamine Sulfate] Other (See Comments)    Unknown reaction  . Penicillins Itching    Itching reported by [facility] Has patient had a PCN reaction causing immediate rash, facial/tongue/throat swelling, SOB or lightheadedness with hypotension: Yes Has patient had a PCN reaction causing severe rash involving mucus membranes or skin necrosis:  No Has patient had a PCN reaction that required hospitalization No Has patient had a PCN reaction occurring within the last 10 years: No If all of the above answers are "NO", then may proceed with Cephalosporin use.   . Zantac [Ranitidine Hcl] Other (See Comments)    Unknown reaction    Family History  Problem Relation Age of Onset  . Stroke Mother   . Heart disease Father   . Alzheimer's disease Sister   . Leukemia Brother   . Arthritis Maternal Grandmother   . Breast cancer Daughter     Prior to Admission medications   Medication Sig Start Date End Date Taking? Authorizing Provider  acetaminophen (TYLENOL) 500 MG tablet Take 500 mg by mouth every 4 (four) hours as needed for mild pain or headache.   Yes [provider]  acetaminophen (TYLENOL) 325 MG tablet Take 2 tablets (650 mg total) by mouth every 6 (six) hours as needed for mild pain (or Fever >/= 101). Patient not taking: Reported on 10/03/2016 02/12/16   Bonnielee Haff, MD  albuterol (PROVENTIL) (2.5 MG/3ML) 0.083% nebulizer solution Take  3 mLs (2.5 mg total) by nebulization every 6 (six) hours as needed for wheezing or shortness of breath. 09/16/16   Magdalen Spatz, NP  alum & mag hydroxide-simeth (MAALOX PLUS) 400-400-40 MG/5ML suspension Take 15 mLs by mouth as needed for indigestion.    [provider]  atorvastatin (LIPITOR) 20 MG tablet Take 2 tablets (40 mg total) by mouth daily. 02/12/16   Bonnielee Haff, MD  carvedilol (COREG) 6.25 MG tablet Take 1 tablet (6.25 mg total) by mouth 2 (two) times daily with a meal. 02/12/16   Bonnielee Haff, MD  Cranberry-Vitamin C-Probiotic (AZO CRANBERRY) 250-30 MG TABS Take 1 tablet by mouth daily.    [provider]  digoxin (LANOXIN) 0.125 MG tablet Take 0.125 mg by mouth daily.    [provider]  diltiazem (DILACOR XR) 120 MG 24 hr capsule Take 120 mg by mouth daily.    [provider]  escitalopram (LEXAPRO) 10 MG tablet TAKE 1 TABLET(10  MG) BY MOUTH DAILY 02/08/16   Marletta Lor, MD  furosemide (LASIX) 20 MG tablet Take 1 tablet (20 mg total) by mouth daily. 09/18/16   Marletta Lor, MD  guaifenesin (ROBITUSSIN) 100 MG/5ML syrup Take 200 mg by mouth every 6 (six) hours as needed for cough.    [provider]  magnesium hydroxide (MILK OF MAGNESIA) 400 MG/5ML suspension Take 30 mLs by mouth daily as needed for mild constipation.    [provider]  polyethylene glycol powder (GLYCOLAX/MIRALAX) powder Take 17 g by mouth daily. 03/25/16   Briscoe Deutscher, DO  potassium chloride SA (K-DUR,KLOR-CON) 20 MEQ tablet Take 1 tablet (20 mEq total) by mouth daily. 02/12/16   Bonnielee Haff, MD  Psyllium (METAMUCIL) WAFR Take 2 Wafers by mouth daily. Prn constipation 03/25/16   Briscoe Deutscher, DO  QUEtiapine (SEROQUEL) 25 MG tablet Take 1 tablet (25 mg total) by mouth at bedtime. 09/18/16   Marletta Lor, MD  sennosides-docusate sodium (SENOKOT-S) 8.6-50 MG tablet Take 1 tablet by mouth daily. 05/28/16   Marletta Lor, MD  traMADol (ULTRAM) 50 MG tablet Take 1 tablet (50 mg total) by mouth every 6 (six) hours as needed for moderate pain. Patient taking differently: Take 50 mg by mouth 3 (three) times daily.  03/09/16   Bonnielee Haff, MD  warfarin (COUMADIN) 1 MG tablet Take 1-4 mg by mouth daily. Take 5 MG every Monday and Thursday. Take 4 MG on Tuesday, Wednesday, Friday, Saturday and Sunday    [provider]    Physical Exam: Vitals:   10/03/16 1445 10/03/16 1500 10/03/16 1515 10/03/16 1545  BP: (!) 150/66 (!) 155/71 (!) 161/64 (!) 159/68  Pulse: (!) 50 (!) 52 (!) 52 (!) 51  Resp: 15 12 16 15   Temp:      TempSrc:      SpO2: 100% 100% 100% 99%  Weight:      Height:          Constitutional: NAD, calm, uncomfortable 2/2 ongoing low back pain Eyes: PERRL, lids and conjunctivae normal ENMT: Mucous membranes are dry. Posterior pharynx clear of any exudate or lesions. Normal  dentition-multiple caps.  Neck: normal, supple, no masses, no thyromegaly Respiratory: clear to auscultation bilaterally, no wheezing, no crackles. Normal respiratory effort. No accessory muscle use.  Cardiovascular: Regular bradycardic rate and rhythm, no murmurs / rubs / gallops. No extremity edema. 2+ pedal pulses. No carotid bruits.  Abdomen: no tenderness, no masses palpated. No hepatosplenomegaly. Bowel sounds positive.  Musculoskeletal:  no clubbing / cyanosis. No joint deformity upper and lower extremities. Good ROM, no contractures. Normal muscle tone.  Skin: no rashes, lesions, ulcers. No induration Neurologic: CN 2-12 grossly intact. Sensation intact, DTR normal. Strength 5/5 x all 4 extremities.  Psychiatric: Alert and oriented x name and place. Mildly anxious, somewhat restless mood.    Labs on Admission: I have personally reviewed following labs and imaging studies  CBC:  Recent Labs Lab 10/03/16 1312  WBC 10.3  HGB 13.1  HCT 40.8  MCV 89.3  PLT 371   Basic Metabolic Panel:  Recent Labs Lab 10/03/16 1421  NA 140  K 5.1  CL 110  CO2 21*  GLUCOSE 120*  BUN 27*  CREATININE 1.79*  CALCIUM 8.8*   GFR: Estimated Creatinine Clearance: 24.9 mL/min (A) (by C-G formula based on SCr of 1.79 mg/dL (H)). Liver Function Tests: No results for input(s): AST, ALT, ALKPHOS, BILITOT, PROT, ALBUMIN in the last 168 hours. No results for input(s): LIPASE, AMYLASE in the last 168 hours. No results for input(s): AMMONIA in the last 168 hours. Coagulation Profile:  Recent Labs Lab 10/03/16 1312  INR 6.92*   Cardiac Enzymes: No results for input(s): CKTOTAL, CKMB, CKMBINDEX, TROPONINI in the last 168 hours. BNP (last 3 results) No results for input(s): PROBNP in the last 8760 hours. HbA1C: No results for input(s): HGBA1C in the last 72 hours. CBG: No results for input(s): GLUCAP in the last 168 hours. Lipid Profile: No results for input(s): CHOL, HDL, LDLCALC, TRIG,  CHOLHDL, LDLDIRECT in the last 72 hours. Thyroid Function Tests: No results for input(s): TSH, T4TOTAL, FREET4, T3FREE, THYROIDAB in the last 72 hours. Anemia Panel: No results for input(s): VITAMINB12, FOLATE, FERRITIN, TIBC, IRON, RETICCTPCT in the last 72 hours. Urine analysis:    Component Value Date/Time   COLORURINE YELLOW 10/03/2016 1320   APPEARANCEUR CLEAR 10/03/2016 1320   LABSPEC 1.008 10/03/2016 1320   PHURINE 6.0 10/03/2016 1320   GLUCOSEU NEGATIVE 10/03/2016 1320   HGBUR NEGATIVE 10/03/2016 1320   BILIRUBINUR NEGATIVE 10/03/2016 1320   BILIRUBINUR neg 03/22/2016 1557   KETONESUR NEGATIVE 10/03/2016 1320   PROTEINUR NEGATIVE 10/03/2016 1320   UROBILINOGEN 0.2 03/22/2016 1557   UROBILINOGEN 0.2 08/04/2014 0657   NITRITE NEGATIVE 10/03/2016 1320   LEUKOCYTESUR NEGATIVE 10/03/2016 1320   Sepsis Labs: @LABRCNTIP (procalcitonin:4,lacticidven:4) )No results found for this or any previous visit (from the past 240 hour(s)).   Radiological Exams on Admission: Dg Chest 2 View  Result Date: 10/03/2016 CLINICAL DATA:  Chronic low back pain, feeling worse today, status post fall. EXAM: CHEST  2 VIEW COMPARISON:  September 02, 2016 FINDINGS: The heart size and mediastinal contours are within normal limits. Both lungs are clear. The lungs are hyperinflated. The visualized skeletal structures are stable. IMPRESSION: No active cardiopulmonary disease.  Hyperinflated lungs. Electronically Signed   By: Abelardo Diesel M.D.   On: 10/03/2016 13:57   Ct Head Wo Contrast  Result Date: 10/03/2016 CLINICAL DATA:  Two syncopal episodes at nursing home today. EXAM: CT HEAD WITHOUT CONTRAST TECHNIQUE: Contiguous axial images were obtained from the base of the skull through the vertex without intravenous contrast. COMPARISON:  March 03, 2016 FINDINGS: Brain: No evidence of acute infarction, hemorrhage, hydrocephalus, extra-axial collection or mass lesion/mass effect. There is chronic diffuse atrophy.  Vascular: No hyperdense vessel or unexpected calcification. Skull: Normal. Negative for fracture or focal lesion. Sinuses/Orbits: No acute finding. Other: None. IMPRESSION: No focal acute intracranial abnormality identified. Chronic diffuse atrophy. Electronically Signed  By: Abelardo Diesel M.D.   On: 10/03/2016 14:16    EKG: (Independently reviewed) sinus rhythm with bradycardic rate 50 bpm, QTC 376 ms, downsloping ST segments in inferior lateral leads unchanged from previous; no acute ischemic changes  Assessment/Plan Principal Problem:   Orthostatic syncope -Suspect volume depletion in a patient with reported poor oral intake, use of diuretics and antihypertensives as well as change in bowel pattern to more loose stools -Slightly more bradycardic than baseline therefore could be secondary to symptomatic bradycardia -Attempt to obtain orthostatic vital signs -Hold offending medications (Lasix, carvedilol, digoxin) -Mobilize with assistance -ECHO  Active Problems:   "Diarrhea" -Later clarified as loose stools in patient with history of IBS type symptoms alternating between constipation and loose stools -Check plain abdominal film to ensure no severe underlying constipation -Holding preadmission daily laxative, MiraLAX and Metamucil    Supratherapeutic INR -Hold warfarin -Given vitamin K in ER -Repeat PT/INR in a.m. -No signs of bleeding at this juncture -Pharmacy to manage warfarin    CKD (chronic kidney disease) stage 3, GFR 30-59 ml/min -BUN and creatinine essentially at baseline although GFR slightly lower than baseline -Gentle IV fluid hydration -Follow labs    Functional dyspnea (2/2 posture/positioning w/ abnormal sprirometry) -New diagnosis -Per outpatient pulmonary documentation patient was to start Proair MDI    PAF (paroxysmal atrial fibrillation)  -Currently maintaining sinus bradycardia with ventricular rate 50 bpm -In setting of bradycardic rate will hold digoxin  and carvedilol -Follow up on digoxin level -Resume long-acting Cardizem in a.m. unless continued issues with bradycardia less than 50 bpm -Warfarin on hold -CHADVASc=5    Hypertension -Current blood pressure control -Slight elevation with patient reporting back pain    Bipolar 1 disorder/Dementia -Continue the following preadmission medications: Trazodone, Seroquel and Lexapro    Chronic diastolic heart failure  -According to family, patient does not have chronic significant lower extremity edema and has never had respiratory symptoms related to reported heart failure -Current chest x-ray unremarkable -On very low dose Lasix prior to admission and given presenting symptoms and reports of variable by mouth intake at facility will discontinue Lasix    Hyperlipemia -Continue Lipitor    Chronic low back pain -Continue Neurontin and Ultram -Advised the patch      DVT prophylaxis: Warfarin Code Status: DO NOT RESUSCITATE  Family Communication: Daughter and grandson at bedside  Disposition Plan: SNF Consults called: None     ELLIS,ALLISON L. ANP-BC [facility] Pager [phone]   If 7PM-7AM, please contact night-coverage [email or web address] Password [email or web address]  10/03/2016, 5:00 PM

## 2016-10-03 NOTE — Progress Notes (Signed)
INR 5.34, was 6.9, received Vit K earlier, MD notified will cont to monitor, Thanks, Arvella Nigh RN.

## 2016-10-03 NOTE — ED Provider Notes (Signed)
[facility] DEPT Provider Note   CSN: [id number] Arrival date & time: 10/03/16  1153     History   Chief Complaint Chief Complaint  Patient presents with  . Fall    HPI Tracy Rodriguez is a 83 y.o. female.  HPI  Pt presenting from [place] after fall.  Per patient she was getting up from the toilet and then passed out causing her to fall.  Per NH staff report to family they saw some shaking activity.  Pt immediately back to her baseline- not postictal.  Per family patient has memory issues and is not a reliable historian.  It is unknown whether she hit her head.  She does take coumadin per chart review.  Pt denies chest pain.   Family also states that she had 3 loose BMs this morning after syncope- none yesterday.  They report she has some chronic issues with her bowels.  Deny recent antibiotic use.    Past Medical History:  Diagnosis Date  . Arthritis    "joints" (08/04/2014)  . Bipolar 1 disorder ([place])   . Cancer of right breast ([place])   . CHF (congestive heart failure) ([place])   . Chronic lower back pain   . Hyperlipemia   . Hypertension   . PAF (paroxysmal atrial fibrillation) [facility])     Patient Active Problem List   Diagnosis Date Noted  . Functional dyspnea (2/2 posture/positioning w/ abnormal sprirometry) 10/03/2016  . Bipolar 1 disorder ([place]) 10/03/2016  . PAF (paroxysmal atrial fibrillation) ([place]) 10/03/2016  . Hypertension 10/03/2016  . Orthostatic syncope 10/03/2016  . Diarrhea 10/03/2016  . Chronic diastolic heart failure ([place]) 10/03/2016  . Supratherapeutic INR 10/03/2016  . Dementia 10/03/2016  . Hyperlipemia 10/03/2016  . CKD (chronic kidney disease) stage 3, GFR 30-59 ml/min 10/03/2016  . Symptomatic bradycardia   . Dyspnea 09/16/2016  . CHF (congestive heart failure) ([place]) 03/04/2016  . Acute renal failure superimposed on stage 3 chronic kidney disease ([place]) 02/08/2016  . Fall   . Syncope 12/05/2015  . Physical deconditioning 12/05/2015  .  Encounter for therapeutic drug monitoring 12/22/2014  . Atrial fibrillation with RVR ([place]) 08/04/2014  . Paroxysmal atrial fibrillation ([place]) 08/04/2014  . Hypertensive urgency   . Obesity 09/27/2013  . Chronic back pain 09/14/2012  . Bipolar disorder ([place]) 09/14/2012    Past Surgical History:  Procedure Laterality Date  . BACK SURGERY    . BREAST BIOPSY Right   . BREAST RECONSTRUCTION Right   . CATARACT EXTRACTION W/ INTRAOCULAR LENS  IMPLANT, BILATERAL Bilateral   . EXCISIONAL HEMORRHOIDECTOMY    . [place]; 1973; 1985   "ruptured discs each time"  . MASTECTOMY Right    cancer  . PLACEMENT OF BREAST IMPLANTS Bilateral    "had to take tissue out of left"  . TONSILLECTOMY    . VAGINAL HYSTERECTOMY      OB History    No data available       Home Medications    Prior to Admission medications   Medication Sig Start Date End Date Taking? Authorizing Provider  acetaminophen (TYLENOL) 500 MG tablet Take 500 mg by mouth every 4 (four) hours as needed for mild pain or headache.   Yes [provider]  albuterol (PROVENTIL) (2.5 MG/3ML) 0.083% nebulizer solution Take 3 mLs (2.5 mg total) by nebulization every 6 (six) hours as needed for wheezing or shortness of breath. 09/16/16  Yes Magdalen Spatz, NP  alum & mag hydroxide-simeth (MAALOX/MYLANTA) [phone]  MG/5ML suspension Take 30 mLs by mouth as needed for indigestion.    Yes [provider]  atorvastatin (LIPITOR) 20 MG tablet Take 2 tablets (40 mg total) by mouth daily. 02/12/16  Yes Bonnielee Haff, MD  carvedilol (COREG) 6.25 MG tablet Take 1 tablet (6.25 mg total) by mouth 2 (two) times daily with a meal. 02/12/16  Yes Bonnielee Haff, MD  Cranberry-Vitamin C-Probiotic (AZO CRANBERRY) 250-30 MG TABS Take 1 tablet by mouth daily.   Yes [provider]  digoxin (LANOXIN) 0.125 MG tablet Take 0.0625 mg by mouth daily. Hold id her pulse is less than 60   Yes [provider]    diltiazem (DILACOR XR) 120 MG 24 hr capsule Take 120 mg by mouth daily.   Yes [provider]  escitalopram (LEXAPRO) 10 MG tablet TAKE 1 TABLET(10 MG) BY MOUTH DAILY Patient taking differently: TAKE 1 TABLET(20 MG) BY MOUTH DAILY 02/08/16  Yes Marletta Lor, MD  furosemide (LASIX) 20 MG tablet Take 1 tablet (20 mg total) by mouth daily. 09/18/16  Yes Marletta Lor, MD  gabapentin (NEURONTIN) 100 MG capsule Take 100 mg by mouth at bedtime.   Yes [provider]  Melatonin 3 MG TABS Take 3 mg by mouth at bedtime.   Yes [provider]  Neomycin-Bacitracin-Polymyxin ([employer] TRIPLE ANTIBIOTIC) [phone] OINT Apply 1 application topically as needed. For skin tear. Abrasions* Clean area with normal saline, apply TAO, cover with bandaid or Gauze and tape. Change as needed until healed.   Yes [provider]  polyethylene glycol powder (GLYCOLAX/MIRALAX) powder Take 17 g by mouth daily. 03/25/16  Yes Briscoe Deutscher, DO  potassium chloride SA (K-DUR,KLOR-CON) 20 MEQ tablet Take 1 tablet (20 mEq total) by mouth daily. 02/12/16  Yes Bonnielee Haff, MD  QUEtiapine (SEROQUEL) 25 MG tablet Take 1 tablet (25 mg total) by mouth at bedtime. 09/18/16  Yes Marletta Lor, MD  sennosides-docusate sodium (SENOKOT-S) 8.6-50 MG tablet Take 1 tablet by mouth daily. 05/28/16  Yes Marletta Lor, MD  traMADol (ULTRAM) 50 MG tablet Take 1 tablet (50 mg total) by mouth every 6 (six) hours as needed for moderate pain. Patient taking differently: Take 50 mg by mouth See admin instructions. Take 50 mg by mouth three times a day at 8:00 am, 14:00 and 22:00 Also take 50 mg by mouth every four hours as needed for sever pain not to exceed total 4/day 03/09/16  Yes Bonnielee Haff, MD  traZODone (DESYREL) 150 MG tablet Take 150 mg by mouth at bedtime.   Yes [provider]  warfarin (COUMADIN) 1 MG tablet Take 1-4 mg by mouth daily. Take 5 MG every Monday and  Thursday. Take 4 MG on Tuesday, Wednesday, Friday, Saturday and Sunday   Yes [provider]  acetaminophen (TYLENOL) 325 MG tablet Take 2 tablets (650 mg total) by mouth every 6 (six) hours as needed for mild pain (or Fever >/= 101). Patient not taking: Reported on 10/03/2016 02/12/16   Bonnielee Haff, MD  guaifenesin (ROBITUSSIN) 100 MG/5ML syrup Take 200 mg by mouth every 6 (six) hours as needed for cough.    [provider]  magnesium hydroxide (MILK OF MAGNESIA) 400 MG/5ML suspension Take 30 mLs by mouth daily as needed for mild constipation.    [provider]  Psyllium (METAMUCIL) WAFR Take 2 Wafers by mouth daily. Prn constipation 03/25/16   Briscoe Deutscher, DO    Family History Family History  Problem Relation Age of Onset  .  Stroke Mother   . Heart disease Father   . Alzheimer's disease Sister   . Leukemia Brother   . Arthritis Maternal Grandmother   . Breast cancer Daughter     Social History Social History  Substance Use Topics  . Smoking status: Never Smoker  . Smokeless tobacco: Never Used  . Alcohol use No     Allergies   Codeine; Morphine and related; Valium [diazepam]; Sulfa antibiotics; Darvon [propoxyphene]; Floxin [ofloxacin]; Levsin [hyoscyamine sulfate]; Penicillins; and Zantac [ranitidine hcl]   Review of Systems Review of Systems  ROS reviewed and all otherwise negative except for mentioned in HPI   Physical Exam Updated Vital Signs BP (!) 159/68   Pulse (!) 51   Temp 97.7 F (36.5 C)   Resp 15   Ht 5\' 9"  (1.753 m)   Wt 73.9 kg (163 lb)   LMP 10/10/1969   SpO2 99%   BMI 24.07 kg/m  Vitals reviewed Physical Exam  Physical Examination: General appearance - alert, well appearing, and in no distress Mental status - alert, oriented to person, place not to time Head- NCAT Eyes - pupils equal and reactive, extraocular eye movements intact Mouth - mucous membranes moist, pharynx normal without lesions  Neck- no midline  tenderness to palpation Chest - clear to auscultation, no wheezes, rales or rhonchi, symmetric air entry Heart - normal rate, regular rhythm, normal S1, S2, no murmurs, rubs, clicks or gallops Abdomen - soft, nontender, nondistended, no masses or organomegaly Neurological - alert, oriented x 2, normal speech, strength 5/5 in extremities x 4, sensation intact Extremities - peripheral pulses normal, no pedal edema, no clubbing or cyanosis Skin - normal coloration and turgor, no rashes   ED Treatments / Results  Labs (all labs ordered are listed, but only abnormal results are displayed) Labs Reviewed  PROTIME-INR - Abnormal; Notable for the following:       Result Value   Prothrombin Time 62.0 (*)    INR 6.92 (*)    All other components within normal limits  BASIC METABOLIC PANEL - Abnormal; Notable for the following:    CO2 21 (*)    Glucose, Bld 120 (*)    BUN 27 (*)    Creatinine, Ser 1.79 (*)    Calcium 8.8 (*)    GFR calc non Af Amer 25 (*)    GFR calc Af Amer 29 (*)    All other components within normal limits  CBC  URINALYSIS, ROUTINE W REFLEX MICROSCOPIC  PREALBUMIN  DIGOXIN LEVEL  PROTIME-INR  I-STAT TROPONIN, ED    EKG  EKG Interpretation  Date/Time:  Thursday October 03 2016 13:00:23 EDT Ventricular Rate:  50 PR Interval:    QRS Duration: 84 QT Interval:  412 QTC Calculation: 376 R Axis:   24 Text Interpretation:  Sinus rhythm Borderline repolarization abnormality low voltage precordial leads No significant change since last tracing Confirmed by Alfonzo Beers [phone]) on 10/03/2016 1:31:33 PM       Radiology Dg Chest 2 View  Result Date: 10/03/2016 CLINICAL DATA:  Chronic low back pain, feeling worse today, status post fall. EXAM: CHEST  2 VIEW COMPARISON:  September 02, 2016 FINDINGS: The heart size and mediastinal contours are within normal limits. Both lungs are clear. The lungs are hyperinflated. The visualized skeletal structures are stable. IMPRESSION: No  active cardiopulmonary disease.  Hyperinflated lungs. Electronically Signed   By: Abelardo Diesel M.D.   On: 10/03/2016 13:57   Ct Head Wo Contrast  Result Date:  10/03/2016 CLINICAL DATA:  Two syncopal episodes at nursing home today. EXAM: CT HEAD WITHOUT CONTRAST TECHNIQUE: Contiguous axial images were obtained from the base of the skull through the vertex without intravenous contrast. COMPARISON:  March 03, 2016 FINDINGS: Brain: No evidence of acute infarction, hemorrhage, hydrocephalus, extra-axial collection or mass lesion/mass effect. There is chronic diffuse atrophy. Vascular: No hyperdense vessel or unexpected calcification. Skull: Normal. Negative for fracture or focal lesion. Sinuses/Orbits: No acute finding. Other: None. IMPRESSION: No focal acute intracranial abnormality identified. Chronic diffuse atrophy. Electronically Signed   By: Abelardo Diesel M.D.   On: 10/03/2016 14:16   Dg Abd Portable 1v  Result Date: 10/03/2016 CLINICAL DATA:  Syncopal episode EXAM: PORTABLE ABDOMEN - 1 VIEW COMPARISON:  CT 08/13/2015 FINDINGS: Nonobstructed bowel-gas pattern. Calcified phleboliths in the left pelvis. IMPRESSION: Nonobstructed bowel-gas pattern Electronically Signed   By: Donavan Foil M.D.   On: 10/03/2016 17:13    Procedures Procedures (including critical care time)  Medications Ordered in ED Medications  0.9 %  sodium chloride infusion (not administered)  traMADol (ULTRAM) tablet 50 mg (not administered)  lidocaine (LIDODERM) 5 % 1 patch (not administered)  traMADol (ULTRAM) tablet 50 mg (50 mg Oral Given 10/03/16 1429)  sodium chloride 0.9 % bolus 500 mL (0 mLs Intravenous Stopped 10/03/16 1604)  phytonadione (VITAMIN K) oral solution 1 mg/0.5 mL (1 mg Oral Given 10/03/16 1548)     Initial Impression / Assessment and Plan / ED Course  I have reviewed the triage vital signs and the nursing notes.  Pertinent labs & imaging results that were available during my care of the patient were  reviewed by me and considered in my medical decision making (see chart for details).    4:21 PM  D/w Erin Hearing, NP triad for admission.  I have talked with family and they are agreeable with this plan.    Pt presentign with c/o syncope resulting in a fall at facility today.  Exam is reassuring.  ekg shows bradycardia pt is not in afib.  INR is elevated over 6- no signs of bleeding on head CT or elsewhere.  Pt is c/o low back pain which family states is chronic in nature.  Pt also continues to bradycardic.  Appears somewhat dehyrated, mild renal insuffiency continues.  Pt given small bolus of saline.  Given small dose of vitamin K, due to elderly, significant fall risk.  D/w family and patient about plan for admission and d/w triad as noted above.  Admitted for syncope eval and monitoring.  Will also check digoxin level as this may be contributing to her bradycardia.    Final Clinical Impressions(s) / ED Diagnoses   Final diagnoses:  Syncope, unspecified syncope type  Fall, initial encounter  Elevated INR  Renal insufficiency    New Prescriptions New Prescriptions   No medications on file     Pixie Casino, MD 10/03/16 1728

## 2016-10-03 NOTE — ED Notes (Signed)
Ordered dinner tray.  

## 2016-10-03 NOTE — ED Triage Notes (Signed)
Pt arrived via EMS from [facility] where she had syncopal episode x2 today.  Pt has had diarrhea for last 3 days per nursing home.  Pt A&O x4 on arrival.  CBG 159 per EMS.  Pt is on blood thinner and not known if pt hit her head at this time.

## 2016-10-03 NOTE — ED Notes (Signed)
Pt assisted to bedside commode by this RN  

## 2016-10-03 NOTE — Progress Notes (Signed)
ANTICOAGULATION CONSULT NOTE - Initial Consult  Pharmacy Consult for warfarin Indication: atrial fibrillation  Patient Measurements: Height: 6' (182.9 cm) Weight: 155 lb 1 oz (70.3 kg) IBW/kg (Calculated) : 73.1  Vital Signs: Temp: 98 F (36.7 C) (07/26 1904) Temp Source: Oral (07/26 1904) BP: 133/51 (07/26 1904) Pulse Rate: 57 (07/26 1904)  Labs:  Recent Labs  10/03/16 1312 10/03/16 1421  HGB 13.1  --   HCT 40.8  --   PLT 202  --   LABPROT 62.0*  --   INR 6.92*  --   CREATININE  --  1.79*    Estimated Creatinine Clearance: 26.4 mL/min (A) (by C-G formula based on SCr of 1.79 mg/dL (H)).  Assessment: 81 yoF with history of PAF on warfarin and chronic diastolic heart failure with elevated INR on admission. No bleeding noted and CBC is normal. Vitamin K 1mg  oral given in ER.  Pharmacy consulted to manage warfarin.    PTA dose: 5 mg on Monday and Thursday and 4 mg AOD's per med rec, no longer f/u with clinic.   Goal of Therapy:  INR 2-3 Monitor platelets by anticoagulation protocol: Yes   Plan:  Hold warfarin tonight Daily INR and CBC Monitor s/sx of bleeding  Dierdre Harness, PharmD Clinical Pharmacist [phone] (Pager) 10/03/2016 7:56 PM

## 2016-10-03 NOTE — ED Notes (Signed)
Pt passed swallow screen.  Pt tolerated sip of water with meds.

## 2016-10-04 ENCOUNTER — Observation Stay (HOSPITAL_COMMUNITY): Payer: Medicare Other

## 2016-10-04 DIAGNOSIS — Z882 Allergy status to sulfonamides status: Secondary | ICD-10-CM | POA: Diagnosis not present

## 2016-10-04 DIAGNOSIS — Z885 Allergy status to narcotic agent status: Secondary | ICD-10-CM | POA: Diagnosis not present

## 2016-10-04 DIAGNOSIS — I5032 Chronic diastolic (congestive) heart failure: Secondary | ICD-10-CM | POA: Diagnosis not present

## 2016-10-04 DIAGNOSIS — Z66 Do not resuscitate: Secondary | ICD-10-CM | POA: Diagnosis present

## 2016-10-04 DIAGNOSIS — N183 Chronic kidney disease, stage 3 (moderate): Secondary | ICD-10-CM | POA: Diagnosis not present

## 2016-10-04 DIAGNOSIS — Z880 Allergy status to penicillin: Secondary | ICD-10-CM | POA: Diagnosis not present

## 2016-10-04 DIAGNOSIS — M545 Low back pain: Secondary | ICD-10-CM | POA: Diagnosis present

## 2016-10-04 DIAGNOSIS — G8929 Other chronic pain: Secondary | ICD-10-CM | POA: Diagnosis present

## 2016-10-04 DIAGNOSIS — Z79899 Other long term (current) drug therapy: Secondary | ICD-10-CM | POA: Diagnosis not present

## 2016-10-04 DIAGNOSIS — R197 Diarrhea, unspecified: Secondary | ICD-10-CM | POA: Diagnosis not present

## 2016-10-04 DIAGNOSIS — E860 Dehydration: Secondary | ICD-10-CM | POA: Diagnosis present

## 2016-10-04 DIAGNOSIS — I951 Orthostatic hypotension: Secondary | ICD-10-CM

## 2016-10-04 DIAGNOSIS — F319 Bipolar disorder, unspecified: Secondary | ICD-10-CM | POA: Diagnosis present

## 2016-10-04 DIAGNOSIS — Z888 Allergy status to other drugs, medicaments and biological substances status: Secondary | ICD-10-CM | POA: Diagnosis not present

## 2016-10-04 DIAGNOSIS — F0390 Unspecified dementia without behavioral disturbance: Secondary | ICD-10-CM | POA: Diagnosis present

## 2016-10-04 DIAGNOSIS — I130 Hypertensive heart and chronic kidney disease with heart failure and stage 1 through stage 4 chronic kidney disease, or unspecified chronic kidney disease: Secondary | ICD-10-CM | POA: Diagnosis present

## 2016-10-04 DIAGNOSIS — Z9882 Breast implant status: Secondary | ICD-10-CM | POA: Diagnosis not present

## 2016-10-04 DIAGNOSIS — D638 Anemia in other chronic diseases classified elsewhere: Secondary | ICD-10-CM | POA: Diagnosis present

## 2016-10-04 DIAGNOSIS — Z7901 Long term (current) use of anticoagulants: Secondary | ICD-10-CM | POA: Diagnosis not present

## 2016-10-04 DIAGNOSIS — I480 Paroxysmal atrial fibrillation: Secondary | ICD-10-CM | POA: Diagnosis not present

## 2016-10-04 DIAGNOSIS — Z8249 Family history of ischemic heart disease and other diseases of the circulatory system: Secondary | ICD-10-CM | POA: Diagnosis not present

## 2016-10-04 DIAGNOSIS — E785 Hyperlipidemia, unspecified: Secondary | ICD-10-CM | POA: Diagnosis present

## 2016-10-04 DIAGNOSIS — K580 Irritable bowel syndrome with diarrhea: Secondary | ICD-10-CM | POA: Diagnosis present

## 2016-10-04 LAB — BASIC METABOLIC PANEL
Anion gap: 4 — ABNORMAL LOW (ref 5–15)
BUN: 22 mg/dL — ABNORMAL HIGH (ref 6–20)
CO2: 22 mmol/L (ref 22–32)
Calcium: 8 mg/dL — ABNORMAL LOW (ref 8.9–10.3)
Chloride: 112 mmol/L — ABNORMAL HIGH (ref 101–111)
Creatinine, Ser: 1.46 mg/dL — ABNORMAL HIGH (ref 0.44–1.00)
GFR calc Af Amer: 37 mL/min — ABNORMAL LOW (ref >60)
GFR calc non Af Amer: 32 mL/min — ABNORMAL LOW (ref >60)
Glucose, Bld: 90 mg/dL (ref 65–99)
Potassium: 4.2 mmol/L (ref 3.5–5.1)
Sodium: 138 mmol/L (ref 135–145)

## 2016-10-04 LAB — PROTIME-INR
INR: 4.1
Prothrombin Time: 40.8 seconds — ABNORMAL HIGH (ref 11.4–15.2)

## 2016-10-04 LAB — CBC
HCT: 31.2 % — ABNORMAL LOW (ref 36.0–46.0)
Hemoglobin: 10.2 g/dL — ABNORMAL LOW (ref 12.0–15.0)
MCH: 28.7 pg (ref 26.0–34.0)
MCHC: 32.7 g/dL (ref 30.0–36.0)
MCV: 87.9 fL (ref 78.0–100.0)
Platelets: 181 10*3/uL (ref 150–400)
RBC: 3.55 MIL/uL — ABNORMAL LOW (ref 3.87–5.11)
RDW: 14.7 % (ref 11.5–15.5)
WBC: 7.4 10*3/uL (ref 4.0–10.5)

## 2016-10-04 LAB — ECHOCARDIOGRAM COMPLETE
Height: 66 in
Weight: 2520 oz

## 2016-10-04 LAB — GLUCOSE, CAPILLARY: Glucose-Capillary: 90 mg/dL (ref 65–99)

## 2016-10-04 MED ORDER — SODIUM CHLORIDE 0.9 % IV BOLUS (SEPSIS)
500.0000 mL | Freq: Once | INTRAVENOUS | Status: AC
  Administered 2016-10-04: 500 mL via INTRAVENOUS

## 2016-10-04 NOTE — Progress Notes (Signed)
INR 4.1, was 5.34, MD notified, will cont to monitor, Thanks Arvella Nigh RN.

## 2016-10-04 NOTE — Progress Notes (Signed)
  Echocardiogram 2D Echocardiogram has been performed.  Tracy Rodriguez 10/04/2016, 3:36 PM

## 2016-10-04 NOTE — Consult Note (Signed)
[facility] CM Primary Care Navigator  10/04/2016  Tiera Mensinger 1931/07/25 [id number]   Met with patientand grandson Engineer, agricultural) at the bedsideto identify possible discharge needs. Patient was not participating with conversation during this visit. She has mild dementia. Grandson reports that patient was having diarrhea, weakness and syncope episodes that led to this admission. Patient is a resident of [facility] assisted living facility for about 6 months and her care needs are being provided by facility staff which includesadministering of medications per grandson's report.  Patient's grandson stated that she is being seen by physician from the facility.  Plan for discharge is to return back to [facility] medically stable.  No further health management needs or concerns noted at this time.   For questions, please contact:  Dannielle Huh, BSN, RN- [facility] Primary Care Navigator  Telephone: [phone] [place]

## 2016-10-04 NOTE — Evaluation (Signed)
Physical Therapy Evaluation Patient Details Name: Tracy Rodriguez MRN: [id number] DOB: 1931-08-23 Today's Date: 10/04/2016   History of Present Illness  Pt is an 83 y/o female admitted secondary to witnessed syncopal episodes x2, suspect due to volume depletion. PMH including but not limited to CHF, mild dementia, Bipolar disorder, and remote breast cancer s/p mastectomy R.  Clinical Impression  Pt presented supine in bed with HOB elevated, awake and willing to participate in therapy session. Prior to admission, pt reported that she ambulated with use of RW and required assistance with ADLs. Pt is from an assisted living facility but was not currently receiving therapy services. Pt ambulated within her room with RW and min guard for safety. Pt would continue to benefit from skilled physical therapy services at this time while admitted and after d/c to address the below listed limitations in order to improve overall safety and independence with functional mobility.     Follow Up Recommendations Home health PT    Equipment Recommendations  None recommended by PT    Recommendations for Other Services       Precautions / Restrictions Precautions Precautions: Fall Restrictions Weight Bearing Restrictions: No      Mobility  Bed Mobility Overal bed mobility: Modified Independent                Transfers Overall transfer level: Needs assistance Equipment used: Rolling walker (2 wheeled) Transfers: Sit to/from Stand Sit to Stand: Supervision         General transfer comment: increased time, supervision for safety  Ambulation/Gait Ambulation/Gait assistance: Min guard Ambulation Distance (Feet): 30 Feet Assistive device: Rolling walker (2 wheeled) Gait Pattern/deviations: Step-through pattern;Decreased stride length Gait velocity: decreased Gait velocity interpretation: Below normal speed for age/gender General Gait Details: mild instability but no overt LOB or need for  physical assistance, min guard for safety; pt only agreeable to ambulate within room  Stairs            Wheelchair Mobility    Modified Rankin (Stroke Patients Only)       Balance Overall balance assessment: Needs assistance Sitting-balance support: Feet supported Sitting balance-Leahy Scale: Fair     Standing balance support: During functional activity;Bilateral upper extremity supported Standing balance-Leahy Scale: Poor Standing balance comment: pt reliant on bilateral UEs on RW                             Pertinent Vitals/Pain Pain Assessment: No/denies pain    Home Living Family/patient expects to be discharged to:: Assisted living               Home Equipment: Walker - 2 wheels;Shower seat      Prior Function Level of Independence: Needs assistance   Gait / Transfers Assistance Needed: pt ambulates with use of RW  ADL's / Homemaking Assistance Needed: requires assistance most of the time with bathing and dressing        Hand Dominance        Extremity/Trunk Assessment   Upper Extremity Assessment Upper Extremity Assessment: Overall WFL for tasks assessed    Lower Extremity Assessment Lower Extremity Assessment: Generalized weakness       Communication   Communication: No difficulties  Cognition Arousal/Alertness: Awake/alert Behavior During Therapy: WFL for tasks assessed/performed Overall Cognitive Status: Impaired/Different from baseline Area of Impairment: Memory                     Memory:  Decreased short-term memory                General Comments      Exercises     Assessment/Plan    PT Assessment Patient needs continued PT services  PT Problem List Decreased activity tolerance;Decreased balance;Decreased mobility;Decreased coordination;Decreased knowledge of use of DME;Decreased safety awareness       PT Treatment Interventions DME instruction;Gait training;Stair training;Functional  mobility training;Therapeutic activities;Therapeutic exercise;Balance training;Neuromuscular re-education;Patient/family education    PT Goals (Current goals can be found in the Care Plan section)  Acute Rehab PT Goals Patient Stated Goal: return home PT Goal Formulation: With patient Time For Goal Achievement: 10/18/16 Potential to Achieve Goals: Good    Frequency Min 3X/week   Barriers to discharge        Co-evaluation               AM-PAC PT "6 Clicks" Daily Activity  Outcome Measure Difficulty turning over in bed (including adjusting bedclothes, sheets and blankets)?: None Difficulty moving from lying on back to sitting on the side of the bed? : A Little Difficulty sitting down on and standing up from a chair with arms (e.g., wheelchair, bedside commode, etc,.)?: A Little Help needed moving to and from a bed to chair (including a wheelchair)?: A Little Help needed walking in hospital room?: A Little Help needed climbing 3-5 steps with a railing? : A Little 6 Click Score: 19    End of Session Equipment Utilized During Treatment: Gait belt Activity Tolerance: Patient tolerated treatment well Patient left: in bed;with call bell/phone within reach;with bed alarm set;with family/visitor present Nurse Communication: Mobility status PT Visit Diagnosis: Unsteadiness on feet (R26.81);Other abnormalities of gait and mobility (R26.89)    Time: 6759-1638 PT Time Calculation (min) (ACUTE ONLY): 16 min   Charges:   PT Evaluation $PT Eval Moderate Complexity: 1 Procedure     PT G Codes:   PT G-Codes **NOT FOR INPATIENT CLASS** Functional Assessment Tool Used: AM-PAC 6 Clicks Basic Mobility;Clinical judgement Functional Limitation: Mobility: Walking and moving around Mobility: Walking and Moving Around Current Status (G6659): At least 20 percent but less than 40 percent impaired, limited or restricted Mobility: Walking and Moving Around Goal Status [phone]): 0 percent  impaired, limited or restricted    [facility], PT, DPT [place] 10/04/2016, 11:40 AM

## 2016-10-04 NOTE — Progress Notes (Signed)
[facility] PROGRESS NOTE  Adrian Specht JFH:[id number] DOB: 13-Dec-1931 DOA: 10/03/2016  PCP: Marletta Lor, MD  Brief History/Interval Summary: 83 year old Caucasian female with a past medical history of dementia, paroxysmal atrial fibrillation on warfarin, hypertension, chronic diastolic CHF, arthritis, chronic low back pain, irritable bowel syndrome who lives in a skilled nursing facility and has been having more loose stools than usual over the last few days. She had 2 episodes of syncope. She was brought into the hospital for further evaluation. She was noted to be dehydrated. She was noted to have bradycardia. She was hospitalized for further management. She was also found to have elevated INR and had to be given vitamin K.  Reason for Visit: Syncope, orthostatic hypertension  Consultants: None  Procedures: None  Antibiotics: None  Subjective/Interval History: Patient feels slightly better this morning. She denies any chest pain, shortness of breath, nausea, vomiting. Hasn't had any diarrhea in the hospital yet. She did feel dizzy when she was stood up for her blood pressure this morning.  ROS: Denies any headaches.  Objective:  Vital Signs  Vitals:   10/03/16 2353 10/04/16 0458 10/04/16 1207 10/04/16 1211  BP:  (!) 103/38 (!) 122/40   Pulse: (!) 59 (!) 51 (!) 53   Resp:  18 20   Temp:  98.1 F (36.7 C) 98.2 F (36.8 C)   TempSrc:  Oral Oral   SpO2:  97% 98%   Weight:  71.4 kg (157 lb 8 oz)    Height:    5\' 6"  (1.676 m)    Intake/Output Summary (Last 24 hours) at 10/04/16 1218 Last data filed at 10/04/16 1035  Gross per 24 hour  Intake          1833.75 ml  Output              400 ml  Net          1433.75 ml   Filed Weights   10/03/16 1200 10/03/16 1904 10/04/16 0458  Weight: 73.9 kg (163 lb) 70.3 kg (155 lb 1 oz) 71.4 kg (157 lb 8 oz)    General appearance: alert, cooperative, appears stated age and no distress Resp: clear to auscultation  bilaterally Cardio: S1, S2 is noted to be bradycardic. GI: soft, non-tender; bowel sounds normal; no masses,  no organomegaly Extremities: extremities normal, atraumatic, no cyanosis or edema Neurologic: Awake, alert. Oriented to place, person. No focal neurological deficits.  Lab Results:  Data Reviewed: I have personally reviewed following labs and imaging studies  CBC:  Recent Labs Lab 10/03/16 1312 10/04/16 0321  WBC 10.3 7.4  HGB 13.1 10.2*  HCT 40.8 31.2*  MCV 89.3 87.9  PLT 202 937    Basic Metabolic Panel:  Recent Labs Lab 10/03/16 1421 10/03/16 1949 10/04/16 0321  NA 140  --  138  K 5.1  --  4.2  CL 110  --  112*  CO2 21*  --  22  GLUCOSE 120*  --  90  BUN 27*  --  22*  CREATININE 1.79*  --  1.46*  CALCIUM 8.8*  --  8.0*  MG  --  2.1  --     GFR: Estimated Creatinine Clearance: 29.5 mL/min (A) (by C-G formula based on SCr of 1.46 mg/dL (H)).  Coagulation Profile:  Recent Labs Lab 10/03/16 1312 10/03/16 1949 10/04/16 0321  INR 6.92* 5.34* 4.10*    CBG:  Recent Labs Lab 10/04/16 0636  GLUCAP 90     Recent  Results (from the past 240 hour(s))  MRSA PCR Screening     Status: None   Collection Time: 10/03/16  7:17 PM  Result Value Ref Range Status   MRSA by PCR NEGATIVE NEGATIVE Final    Comment:        The GeneXpert MRSA Assay (FDA approved for NASAL specimens only), is one component of a comprehensive MRSA colonization surveillance program. It is not intended to diagnose MRSA infection nor to guide or monitor treatment for MRSA infections.       Radiology Studies: Dg Chest 2 View  Result Date: 10/03/2016 CLINICAL DATA:  Chronic low back pain, feeling worse today, status post fall. EXAM: CHEST  2 VIEW COMPARISON:  September 02, 2016 FINDINGS: The heart size and mediastinal contours are within normal limits. Both lungs are clear. The lungs are hyperinflated. The visualized skeletal structures are stable. IMPRESSION: No active  cardiopulmonary disease.  Hyperinflated lungs. Electronically Signed   By: Abelardo Diesel M.D.   On: 10/03/2016 13:57   Ct Head Wo Contrast  Result Date: 10/03/2016 CLINICAL DATA:  Two syncopal episodes at nursing home today. EXAM: CT HEAD WITHOUT CONTRAST TECHNIQUE: Contiguous axial images were obtained from the base of the skull through the vertex without intravenous contrast. COMPARISON:  March 03, 2016 FINDINGS: Brain: No evidence of acute infarction, hemorrhage, hydrocephalus, extra-axial collection or mass lesion/mass effect. There is chronic diffuse atrophy. Vascular: No hyperdense vessel or unexpected calcification. Skull: Normal. Negative for fracture or focal lesion. Sinuses/Orbits: No acute finding. Other: None. IMPRESSION: No focal acute intracranial abnormality identified. Chronic diffuse atrophy. Electronically Signed   By: Abelardo Diesel M.D.   On: 10/03/2016 14:16   Dg Abd Portable 1v  Result Date: 10/03/2016 CLINICAL DATA:  Syncopal episode EXAM: PORTABLE ABDOMEN - 1 VIEW COMPARISON:  CT 08/13/2015 FINDINGS: Nonobstructed bowel-gas pattern. Calcified phleboliths in the left pelvis. IMPRESSION: Nonobstructed bowel-gas pattern Electronically Signed   By: Donavan Foil M.D.   On: 10/03/2016 17:13     Medications:  Scheduled: . atorvastatin  40 mg Oral Daily  . diltiazem  120 mg Oral Daily  . escitalopram  10 mg Oral QHS  . gabapentin  100 mg Oral QHS  . lidocaine  1 patch Transdermal Q24H  . Melatonin  3 mg Oral QHS  . QUEtiapine  25 mg Oral QHS  . sodium chloride flush  3 mL Intravenous Q12H  . traZODone  150 mg Oral QHS   Continuous: . sodium chloride 75 mL/hr (10/04/16 0850)   XFG:[id number] **OR** acetaminophen, albuterol, alum & mag hydroxide-simeth, magnesium hydroxide, ondansetron **OR** ondansetron (ZOFRAN) IV, traMADol  Assessment/Plan:  Principal Problem:   Orthostatic syncope Active Problems:   Functional dyspnea (2/2 posture/positioning w/ abnormal  sprirometry)   Bipolar 1 disorder (HCC)   PAF (paroxysmal atrial fibrillation) (HCC)   Hypertension   Diarrhea   Chronic diastolic heart failure (HCC)   Supratherapeutic INR   Dementia   Hyperlipemia   CKD (chronic kidney disease) stage 3, GFR 30-59 ml/min    Orthostatic hypotension with syncope Thought to be due to volume depletion from poor oral intake, frequent loose stools and being on diuretics and antihypertensives. Still orthostatic this morning. Continue IV fluids for now. Creatinine has improved. Continue to hold her diuretics.  Acute diarrhea. Apparently patient has had history of IBS type symptoms. Alternating between constipation and loose stools. She tells me that she has had more loose stools in the last few days. She was on laxatives at the nursing facility.  These have been held for now. Frequent loose stools could've also contributed to dehydration. Continue to monitor for now. Do not suspect any infectious etiology at this time.  Supratherapeutic INR. INR was 6.9, when she was hospitalized. She was given a dose of vitamin K. INR has improved. No evidence for bleeding.  Chronic kidney disease stage III. Creatinine close to baseline for the most part. Continue to monitor urine output.  History of paroxysmal atrial fibrillation. Currently sinus bradycardia with a ventricular rate in the 50s. Digoxin level is normal. Digoxin and carvedilol on hold. Also noted to be on Cardizem, which will also be held due to soft blood pressures. Telemetry without any other arrhythmias.  History of essential hypertension. Blood pressure borderline low. Hold her antihypertensives for now.  History of dementia/bipolar disorder. Continue home medications.  History of chronic diastolic CHF. No evidence for fluid overload. She was on Lasix prior to admission, which has been held due to dehydration. Continue to monitor. Reviewed her weights over the last many months. This is the lowest she  has weighed in a very long time. She has lost 3-4 kg over the last few months.  Chronic low back pain. Continue home medications including Neurontin and Ultram.  Normocytic anemia, most likely due to chronic disease. Hemoglobin close to baseline. Drop in her hemoglobin is certainly dilutional. She was likely hemoconcentrated at admission.  DVT Prophylaxis: On warfarin    Code Status: DO NOT RESUSCITATE  Family Communication: Discussed with the patient and her grandson  Disposition Plan: Management as outlined above. PT and OT evaluation. Continue hydration for now. Repeat orthostatics tomorrow and later today. Hopefully will she will be able to go back to her skilled nursing facility tomorrow.    LOS: 0 days   [facility] Pager [phone] 10/04/2016, 12:18 PM  If 7PM-7AM, please contact night-coverage at [email or web address], password [facility]

## 2016-10-04 NOTE — Progress Notes (Signed)
ANTICOAGULATION CONSULT NOTE  Pharmacy Consult for warfarin Indication: atrial fibrillation  Patient Measurements: Height: 6' (182.9 cm) Weight: 157 lb 8 oz (71.4 kg) IBW/kg (Calculated) : 73.1  Vital Signs: Temp: 98.1 F (36.7 C) (07/27 0458) Temp Source: Oral (07/27 0458) BP: 103/38 (07/27 0458) Pulse Rate: 51 (07/27 0458)  Labs:  Recent Labs  10/03/16 1312 10/03/16 1421 10/03/16 1949 10/04/16 0321  HGB 13.1  --   --  10.2*  HCT 40.8  --   --  31.2*  PLT 202  --   --  181  LABPROT 62.0*  --  50.4* 40.8*  INR 6.92*  --  5.34* 4.10*  CREATININE  --  1.79*  --  1.46*    Estimated Creatinine Clearance: 32.9 mL/min (A) (by C-G formula based on SCr of 1.46 mg/dL (H)).  Assessment: 89 yoF admitted for syncopal episodes with history of PAF on warfarin. INR elevated at 6.92 on admit. No bleeding noted and CBC WNL. Given 1mg  PO vitamin K in ED. Pharmacy consulted to manage warfarin. Held x1 day and INR trending down, 4.10 today. Hgb decreased, 13.1>10.2. Pltc 181. No bleeding noted.  PTA dose: 5 mg on Monday and Thursday and 4 mg AOD's per med rec, no longer f/u with clinic.   Goal of Therapy:  INR 2-3 Monitor platelets by anticoagulation protocol: Yes   Plan:  Hold warfarin tonight Daily INR and CBC Monitor s/sx of bleeding   Erin N. Gerarda Fraction, PharmD PGY1 Pharmacy Resident Pager: [phone]  I discussed / reviewed the pharmacy note by Dr. Gerarda Fraction and I agree with the resident's findings and plans as documented.  Thank you Anette Guarneri, PharmD [phone]   10/04/2016 9:58 AM

## 2016-10-04 NOTE — Progress Notes (Addendum)
Initial Nutrition Assessment  DOCUMENTATION CODES:   Not applicable  INTERVENTION:   Magic cup TID with meals, each supplement provides 290 kcal and 9 grams of protein  NUTRITION DIAGNOSIS:   Inadequate oral intake related to poor appetite as evidenced by per patient/family report.  GOAL:   Patient will meet greater than or equal to 90% of their needs  MONITOR:   PO intake, Supplement acceptance  REASON FOR ASSESSMENT:   Malnutrition Screening Tool    ASSESSMENT:   Pt with PMH of mild dementia, PAF, HTN, diastolic CHF, CKD stage 3, IBS, and remote history of right breast cancer. Presents this admission with orthostatic syncope with suspected volume depletion and diarrhea.   Pt reports having a loss of appetite PTA for an unknown amount of time. States she has not tried eating during this admission and does not like the tastes of Ensures. Pt comes from SNF, family member at bedside states she typically consumes 3 meal/day but could not give a time frame of loss in appetite either. Will change Ensures to magic cups per pt's request in hopes to provide sufficient amounts of protein.   Pt unsure of her UBW, Records indicate a wt loss of 6.5% from January 2018 (168 lbs to current 157 lb). This percentage in this time frame is not significant for malnutrition. If loss in appetite continues, pt is at high risk for malnutrition given her immobility with chronic BLE.   Nutrition-Focused physical exam completed. Findings are mild fat depletion in orbital region, mild muscle depletion in the temple region, and moderate edema in BLE.   Medications reviewed and include: NS @ 75 ml/hr Labs reviewed.   Diet Order:  Diet regular Room service appropriate? Yes; Fluid consistency: Thin  Skin:  Reviewed, no issues  Last BM:  10/03/16  Height:   Ht Readings from Last 1 Encounters:  10/04/16 5\' 6"  (1.676 m)    Weight:   Wt Readings from Last 1 Encounters:  10/04/16 157 lb 8 oz (71.4  kg)    Ideal Body Weight:  59.1 kg  BMI:  Body mass index is 25.42 kg/m.  Estimated Nutritional Needs:   Kcal:  1200-1400 (MSJ)  Protein:  75-85 grams (1-1.2 g/kg)  Fluid:  >1.2 L/day   EDUCATION NEEDS:   No education needs identified at this time  [place], LDN Clinical Nutrition Pager # - [phone]

## 2016-10-04 NOTE — Clinical Social Work Note (Signed)
Clinical Social Work Assessment  Patient Details  Name: Tracy Rodriguez MRN: [id number] Date of Birth: 28-Jan-1932  Date of referral:  10/04/16               Reason for consult:  Discharge Planning                Permission sought to share information with:  Chartered certified accountant granted to share information::  Yes, Verbal Permission Granted  Name::        Agency::  [facility]  Relationship::  Ship broker Information:     Housing/Transportation Living arrangements for the past 2 months:  [place] of Information:  Patient, Medical Team, Other (Comment Required) Biomedical scientist) Patient Interpreter Needed:  None Criminal Activity/Legal Involvement Pertinent to Current Situation/Hospitalization:  No - Comment as needed Significant Relationships:  Adult Children, Other Family Members Lives with:  Facility Resident Do you feel safe going back to the place where you live?  Yes Need for family participation in patient care:  Yes (Comment)  Care giving concerns:  Patient is from [facility].   Social Worker assessment / plan:  CSW met with patient. Grandson at bedside. CSW introduced role and explained that discharge planning would be discussed. Patient confirmed that she is from [place] and plans to return once discharged. Family will transport. No further concerns. CSW encouraged patient to contact CSW as needed. CSW will continue to follow patient and her family for support and facilitate discharge back to ALF once medically stable.  Employment status:  Retired Forensic scientist:  Medicare PT Recommendations:  Not assessed at this time [place] / Referral to community resources:  Other (Comment Required) (Plan is to return to ALF)  Patient/Family's Response to care:  Patient agreeable to return to ALF. Patient's family supportive and involved in patient's care. Patient and her grandson appreciated social work  intervention.  Patient/Family's Understanding of and Emotional Response to Diagnosis, Current Treatment, and Prognosis:  Patient appears to have a good understanding of the reason for admission and her need to return to ALF once discharged. Patient and her grandson appear happy with hospital care.  Emotional Assessment Appearance:  Appears stated age Attitude/Demeanor/Rapport:  Other (Pleasant) Affect (typically observed):  Accepting, Appropriate, Calm, Pleasant Orientation:  Oriented to Self, Oriented to Place, Oriented to  Time, Oriented to Situation Alcohol / Substance use:  Never Used Psych involvement (Current and /or in the community):  No (Comment)  Discharge Needs  Concerns to be addressed:  Care Coordination Readmission within the last 30 days:  No Current discharge risk:  None Barriers to Discharge:  Continued Medical Work up   Candie Chroman, LCSW 10/04/2016, 11:19 AM

## 2016-10-05 LAB — CBC
HCT: 33.6 % — ABNORMAL LOW (ref 36.0–46.0)
Hemoglobin: 10.5 g/dL — ABNORMAL LOW (ref 12.0–15.0)
MCH: 27.9 pg (ref 26.0–34.0)
MCHC: 31.3 g/dL (ref 30.0–36.0)
MCV: 89.4 fL (ref 78.0–100.0)
Platelets: 189 10*3/uL (ref 150–400)
RBC: 3.76 MIL/uL — ABNORMAL LOW (ref 3.87–5.11)
RDW: 14.8 % (ref 11.5–15.5)
WBC: 7.3 10*3/uL (ref 4.0–10.5)

## 2016-10-05 LAB — BASIC METABOLIC PANEL
Anion gap: 4 — ABNORMAL LOW (ref 5–15)
BUN: 11 mg/dL (ref 6–20)
CO2: 23 mmol/L (ref 22–32)
Calcium: 8.4 mg/dL — ABNORMAL LOW (ref 8.9–10.3)
Chloride: 115 mmol/L — ABNORMAL HIGH (ref 101–111)
Creatinine, Ser: 1.1 mg/dL — ABNORMAL HIGH (ref 0.44–1.00)
GFR calc Af Amer: 52 mL/min — ABNORMAL LOW (ref >60)
GFR calc non Af Amer: 45 mL/min — ABNORMAL LOW (ref >60)
Glucose, Bld: 96 mg/dL (ref 65–99)
Potassium: 4.1 mmol/L (ref 3.5–5.1)
Sodium: 142 mmol/L (ref 135–145)

## 2016-10-05 LAB — GLUCOSE, CAPILLARY: Glucose-Capillary: 101 mg/dL — ABNORMAL HIGH (ref 65–99)

## 2016-10-05 LAB — PROTIME-INR
INR: 2.71
Prothrombin Time: 29.3 seconds — ABNORMAL HIGH (ref 11.4–15.2)

## 2016-10-05 MED ORDER — WARFARIN SODIUM 1 MG PO TABS: 1.0000 mg | ORAL_TABLET | Freq: Every day | ORAL | Status: DC

## 2016-10-05 MED ORDER — CARVEDILOL 3.125 MG PO TABS: 3.1250 mg | ORAL_TABLET | Freq: Two times a day (BID) | ORAL | Status: DC

## 2016-10-05 MED ORDER — POLYETHYLENE GLYCOL 3350 17 GM/SCOOP PO POWD: 17.0000 g | Freq: Every day | ORAL | 1 refills | Status: DC | PRN

## 2016-10-05 MED ORDER — TRAMADOL HCL 50 MG PO TABS
50.0000 mg | ORAL_TABLET | ORAL | 0 refills | Status: AC
Start: 2016-10-05 — End: ?

## 2016-10-05 MED ORDER — TRAZODONE HCL 150 MG PO TABS
150.0000 mg | ORAL_TABLET | Freq: Every day | ORAL | 0 refills | Status: AC
Start: 2016-10-05 — End: ?

## 2016-10-05 MED ORDER — QUETIAPINE FUMARATE 25 MG PO TABS: 25.0000 mg | ORAL_TABLET | Freq: Every day | ORAL | 0 refills | Status: DC

## 2016-10-05 NOTE — Progress Notes (Signed)
Report called to [place] at [facility].  Medications that were discontinued were reviewed with Tracy Rodriguez in addition to dosages of Coumadin and when INR needs to be rechecked. Also went over all this information with patients daughters in the room prior to discharge.    Discharge instructions reviewed with both daughters, questions answered, verbalized understanding.  Patient transported to the main entrance via wheelchair to be taken back to facility by daughters.

## 2016-10-05 NOTE — Discharge Instructions (Signed)
[facility] Hospital Stay Proper nutrition can help your body recover from illness and injury.   Foods and beverages high in protein, vitamins, and minerals help rebuild muscle loss, promote healing, & reduce fall risk.   In addition to eating healthy foods, a nutrition shake is an easy, delicious way to get the nutrition you need during and after your hospital stay  It is recommended that you continue to drink 2 bottles per day of:  Ensure Enlive for at least 1 month (30 days) after your hospital stay   Tips for adding a nutrition shake into your routine: As allowed, drink one with vitamins or medications instead of water or juice Enjoy one as a tasty mid-morning or afternoon snack Drink cold or make a milkshake out of it Drink one instead of milk with cereal or snacks Use as a coffee creamer   Available at the following grocery stores and pharmacies:           * [facility] [phone]            For COUPONS visit: www.ensure.com/join or http://dawson-may.com/   Suggested Substitutions Ensure Plus = Boost Plus = Carnation Breakfast Essentials = Boost Compact Ensure Active Clear = Boost Breeze Glucerna Shake = Boost Glucose Control = Carnation Breakfast Essentials SUGAR FREE     Orthostatic Hypotension Orthostatic hypotension is a sudden drop in blood pressure that happens when you quickly change positions, such as when you get up from a seated or lying position. Blood pressure is a measurement of how strongly, or weakly, your blood is pressing against the walls of your arteries. Arteries are blood vessels that carry blood from your heart throughout your body. When blood pressure is too low, you may not get enough blood to your brain or to the rest of your organs. This can cause weakness, light-headedness, rapid  heartbeat, and fainting. This can last for just a few seconds or for up to a few minutes. Orthostatic hypotension is usually not a serious problem. However, if it happens frequently or gets worse, it may be a sign of something more serious. What are the causes? This condition may be caused by:  Sudden changes in posture, such as standing up quickly after you have been sitting or lying down.  Blood loss.  Loss of body fluids (dehydration).  Heart problems.  Hormone (endocrine) problems.  Pregnancy.  Severe infection.  Lack of certain nutrients.  Severe allergic reactions (anaphylaxis).  Certain medicines, such as blood pressure medicine or medicines that make the body lose excess fluids (diuretics). Sometimes, this condition can be caused by not taking medicine as directed, such as taking too much of a certain medicine.  What increases the risk? Certain factors can make you more likely to develop orthostatic hypotension, including:  Age. Risk increases as you get older.  Conditions that affect the heart or the central nervous system.  Taking certain medicines, such as blood pressure medicine or diuretics.  Being pregnant.  What are the signs or symptoms? Symptoms of this condition may include:  Weakness.  Light-headedness.  Dizziness.  Blurred vision.  Fatigue.  Rapid heartbeat.  Fainting, in severe cases.  How is this diagnosed? This condition  is diagnosed based on:  Your medical history.  Your symptoms.  Your blood pressure measurement. Your health care provider will check your blood pressure when you are: ? Lying down. ? Sitting. ? Standing.  A blood pressure reading is recorded as two numbers, such as "120 over 80" (or 120/80). The first ("top") number is called the systolic pressure. It is a measure of the pressure in your arteries as your heart beats. The second ("bottom") number is called the diastolic pressure. It is a measure of the pressure in  your arteries when your heart relaxes between beats. Blood pressure is measured in a unit called mm Hg. Healthy blood pressure for adults is 120/80. If your blood pressure is below 90/60, you may be diagnosed with hypotension. Other information or tests that may be used to diagnose orthostatic hypotension include:  Your other vital signs, such as your heart rate and temperature.  Blood tests.  Tilt table test. For this test, you will be safely secured to a table that moves you from a lying position to an upright position. Your heart rhythm and blood pressure will be monitored during the test.  How is this treated? Treatment for this condition may include:  Changing your diet. This may involve eating more salt (sodium) or drinking more water.  Taking medicines to raise your blood pressure.  Changing the dosage of certain medicines you are taking that might be lowering your blood pressure.  Wearing compression stockings. These stockings help to prevent blood clots and reduce swelling in your legs.  In some cases, you may need to go to the hospital for:  Fluid replacement. This means you will receive fluids through an IV tube.  Blood replacement. This means you will receive donated blood through an IV tube (transfusion).  Treating an infection or heart problems, if this applies.  Monitoring. You may need to be monitored while medicines that you are taking wear off.  Follow these instructions at home: Eating and drinking   Drink enough fluid to keep your urine clear or pale yellow.  Eat a healthy diet and follow instructions from your health care provider about eating or drinking restrictions. A healthy diet includes: ? Fresh fruits and vegetables. ? Whole grains. ? Lean meats. ? Low-fat dairy products.  Eat extra salt only as directed. Do not add extra salt to your diet unless your health care provider told you to do that.  Eat frequent, small meals.  Avoid standing up  suddenly after eating. Medicines  Take over-the-counter and prescription medicines only as told by your health care provider. ? Follow instructions from your health care provider about changing the dosage of your current medicines, if this applies. ? Do not stop or adjust any of your medicines on your own. General instructions  Wear compression stockings as told by your health care provider.  Get up slowly from lying down or sitting positions. This gives your blood pressure a chance to adjust.  Avoid hot showers and excessive heat as directed by your health care provider.  Return to your normal activities as told by your health care provider. Ask your health care provider what activities are safe for you.  Do not use any products that contain nicotine or tobacco, such as cigarettes and e-cigarettes. If you need help quitting, ask your health care provider.  Keep all follow-up visits as told by your health care provider. This is important. Contact a health care provider if:  You vomit.  You have diarrhea.  You have a fever for more than 2-3 days.  You feel more thirsty than usual.  You feel weak and tired. Get help right away if:  You have chest pain.  You have a fast or irregular heartbeat.  You develop numbness in any part of your body.  You cannot move your arms or your legs.  You have trouble speaking.  You become sweaty or feel lightheaded.  You faint.  You feel short of breath.  You have trouble staying awake.  You feel confused. This information is not intended to replace advice given to you by your health care provider. Make sure you discuss any questions you have with your health care provider. Document Released: 02/15/2002 Document Revised: 11/14/2015 Document Reviewed: 08/18/2015 Elsevier Interactive Patient Education  2018 [employer].

## 2016-10-05 NOTE — Clinical Social Work Note (Addendum)
Pt ready for discharge today and will return to [facility]. CSW spoke with Caryl Pina [phone]) at [facility] to confirm pt can return. CSW faxed discharge summary and FL-2. Room and report provided to RN Heather. Pt's daughters-Beth and [name] to transport pt to facility. CSW signing off as no further Social Work needs identified.   Oretha Ellis, [place], [place] Work Letitia Libra coverage) [phone]

## 2016-10-05 NOTE — Discharge Summary (Signed)
[facility]  Physician Discharge Summary   Patient ID: Tracy Rodriguez MRN: [id number] DOB/AGE: 1932/03/02 83 y.o.  Admit date: 10/03/2016 Discharge date: 10/05/2016  PCP: Marletta Lor, MD  DISCHARGE DIAGNOSES:  Principal Problem:   Orthostatic syncope Active Problems:   Syncope   Functional dyspnea (2/2 posture/positioning w/ abnormal sprirometry)   Bipolar 1 disorder (HCC)   PAF (paroxysmal atrial fibrillation) (HCC)   Hypertension   Diarrhea   Chronic diastolic heart failure (HCC)   Supratherapeutic INR   Dementia   Hyperlipemia   CKD (chronic kidney disease) stage 3, GFR 30-59 ml/min   RECOMMENDATIONS FOR OUTPATIENT FOLLOW UP: 1. She needs physical therapy. Please arrange at the skilled nursing facility 2. Patient needs to get up slowly from a sitting or lying position to avoid sudden drop in blood pressures. 3. Please note changes to medications. 4. Please obtain blood work in the form of CBC and basic metabolic panel on Monday. Could consider resuming Lasix on an every other day basis. If all her blood work is stable. 5. Please check orthostatics on a daily basis for now. 6. Please see instructions regarding Coreg 7. Diltiazem has been stopped for now due to bradycardia.   DISCHARGE CONDITION: fair  Diet recommendation: Heart healthy  Filed Weights   10/03/16 1904 10/04/16 0458 10/05/16 0430  Weight: 70.3 kg (155 lb 1 oz) 71.4 kg (157 lb 8 oz) 71.8 kg (158 lb 4.8 oz)    INITIAL HISTORY: 83 year old Caucasian female with a past medical history of dementia, paroxysmal atrial fibrillation on warfarin, hypertension, chronic diastolic CHF, arthritis, chronic low back pain, irritable bowel syndrome who lives in a skilled nursing facility and has been having more loose stools than usual over the last few days. She had 2 episodes of syncope. She was brought into the hospital for further evaluation. She was noted to be dehydrated. She was noted to have  bradycardia. She was hospitalized for further management. She was also found to have elevated INR and had to be given vitamin K.  Consultations:  None  Procedures: Transthoracic echocardiogram Study Conclusions  - Left ventricle: The cavity size was normal. There was moderate   concentric hypertrophy. Systolic function was vigorous. The   estimated ejection fraction was in the range of 65% to 70%. Wall   motion was normal; there were no regional wall motion   abnormalities. Doppler parameters are consistent with abnormal   left ventricular relaxation (grade 1 diastolic dysfunction). The   E/e&' ratio is >15, suggesting elevated LV filling pressure. - Aortic valve: Trileaflet. Cusp separation was normal. Sclerosis   without stenosis. Transvalvular velocity was minimally increased.   There was no stenosis. There was no regurgitation. Mean gradient   (S): 9 mm Hg. Peak gradient (S): 18 mm Hg. - Aorta: Ascending aortic diameter: 39 mm (S). - Ascending aorta: The ascending aorta was mildly dilated. - Left atrium: The atrium was normal in size. - Inferior vena cava: The vessel was dilated. The respirophasic   diameter changes were blunted (< 50%), consistent with elevated   central venous pressure.  Impressions:  - Compared to a prior study in 02/2016, the LVEF is unchanged. The   most distally visualized aspect of the ascending aorta measures   3.9 cm which is mildly dilated.  HOSPITAL COURSE:   Orthostatic hypotension with syncope Thought to be due to volume depletion from poor oral intake, frequent loose stools and being on diuretics and antihypertensives. Patient was given IV fluids. Symptoms have  improved. She feels much better this morning. Blood pressure still noted to drop some from lying to standing position, but not as much as before. She has ambulated with physical therapy. She will need to get physical therapy at her skilled nursing facility. Changes made to her cardiac  medications as noted below.   Acute diarrhea. Apparently patient has had history of IBS type symptoms. Alternating between constipation and loose stools. She did mention that she has had more loose stools in the last few days. She was on laxatives at the nursing facility. These have been held for now. Frequent loose stools could've also contributed to dehydration. She has not had any diarrhea during this hospitalization. Change MiraLAX to as needed. Senokot may be continued to avoid constipation.  Supratherapeutic INR. INR was 6.9, when she was hospitalized. She was given a dose of vitamin K. INR has improved. No evidence for bleeding. Reason for her elevated INR is not entirely clear. Warfarin to be continued at lower dose. Follow PT/INR at the skilled nursing facility.  Chronic kidney disease stage III. Creatinine was thought to be close to baseline for the most part. Perhaps, slightly elevated. Much improved this morning.  History of paroxysmal atrial fibrillation. Currently sinus bradycardia with a ventricular rate in the 50s. Digoxin level is normal. Patient was also noted to be on carvedilol and diltiazem. All of these medications were initially held. At this time okay to resume digoxin. Discontinue diltiazem. Monitor pulse at the skilled nursing facility and resume Coreg at a lower dose on Monday if heart rate is not below 65. Telemetry without any other arrhythmias.  History of essential hypertension. Blood pressure was initially borderline low. She was noted to have orthostatic hypotension as discussed above. Blood pressures have stabilized. See above for recommendations regarding her blood pressure lowering agents.  History of dementia/bipolar disorder. Stable. Continue home medications.  History of chronic diastolic CHF. No evidence for fluid overload. She was on Lasix prior to admission, which was held due to dehydration. Reviewed her weights over the last many months. This is  the lowest she has weighed in a very long time. She has lost 3-4 kg over the last few months. According to the daughter, patient has had poor appetite for the past many months and has lost weight as a result. At this time, we would recommend holding the Lasix, allowing her to hydrate herself over the next few days, repeating her labs and then perhaps her resuming the Lasix every other day.  Weight loss This concern was raised by the daughter. She did mention that patient has had poor appetite over the past many months. This could've contributed to her weight loss. She also has dementia, which is likely contributing as well. Continue to monitor weights and if weight loss continues to occur, then she may need further workup.  Chronic low back pain. Continue home medications including Neurontin and Ultram.  Normocytic anemia, most likely due to chronic disease. Hemoglobin close to baseline. Drop in her hemoglobin is dilutional. She was likely hemoconcentrated at admission. Stable this morning.  Overall, stable and improved. Discussed in detail with patient's daughter. Okay for discharge to skilled nursing facility today.    PERTINENT LABS:  The results of significant diagnostics from this hospitalization (including imaging, microbiology, ancillary and laboratory) are listed below for reference.    Microbiology: Recent Results (from the past 240 hour(s))  MRSA PCR Screening     Status: None   Collection Time: 10/03/16  7:17 PM  Result Value Ref Range Status   MRSA by PCR NEGATIVE NEGATIVE Final    Comment:        The GeneXpert MRSA Assay (FDA approved for NASAL specimens only), is one component of a comprehensive MRSA colonization surveillance program. It is not intended to diagnose MRSA infection nor to guide or monitor treatment for MRSA infections.      Labs: Basic Metabolic Panel:  Recent Labs Lab 10/03/16 1421 10/03/16 1949 10/04/16 0321 10/05/16 0439  NA 140  --   138 142  K 5.1  --  4.2 4.1  CL 110  --  112* 115*  CO2 21*  --  22 23  GLUCOSE 120*  --  90 96  BUN 27*  --  22* 11  CREATININE 1.79*  --  1.46* 1.10*  CALCIUM 8.8*  --  8.0* 8.4*  MG  --  2.1  --   --    CBC:  Recent Labs Lab 10/03/16 1312 10/04/16 0321 10/05/16 0439  WBC 10.3 7.4 7.3  HGB 13.1 10.2* 10.5*  HCT 40.8 31.2* 33.6*  MCV 89.3 87.9 89.4  PLT 202 181 189    CBG:  Recent Labs Lab 10/04/16 0636 10/05/16 0550  GLUCAP 90 101*     IMAGING STUDIES Dg Chest 2 View  Result Date: 10/03/2016 CLINICAL DATA:  Chronic low back pain, feeling worse today, status post fall. EXAM: CHEST  2 VIEW COMPARISON:  September 02, 2016 FINDINGS: The heart size and mediastinal contours are within normal limits. Both lungs are clear. The lungs are hyperinflated. The visualized skeletal structures are stable. IMPRESSION: No active cardiopulmonary disease.  Hyperinflated lungs. Electronically Signed   By: Abelardo Diesel M.D.   On: 10/03/2016 13:57   Ct Head Wo Contrast  Result Date: 10/03/2016 CLINICAL DATA:  Two syncopal episodes at nursing home today. EXAM: CT HEAD WITHOUT CONTRAST TECHNIQUE: Contiguous axial images were obtained from the base of the skull through the vertex without intravenous contrast. COMPARISON:  March 03, 2016 FINDINGS: Brain: No evidence of acute infarction, hemorrhage, hydrocephalus, extra-axial collection or mass lesion/mass effect. There is chronic diffuse atrophy. Vascular: No hyperdense vessel or unexpected calcification. Skull: Normal. Negative for fracture or focal lesion. Sinuses/Orbits: No acute finding. Other: None. IMPRESSION: No focal acute intracranial abnormality identified. Chronic diffuse atrophy. Electronically Signed   By: Abelardo Diesel M.D.   On: 10/03/2016 14:16   Dg Abd Portable 1v  Result Date: 10/03/2016 CLINICAL DATA:  Syncopal episode EXAM: PORTABLE ABDOMEN - 1 VIEW COMPARISON:  CT 08/13/2015 FINDINGS: Nonobstructed bowel-gas pattern. Calcified  phleboliths in the left pelvis. IMPRESSION: Nonobstructed bowel-gas pattern Electronically Signed   By: Donavan Foil M.D.   On: 10/03/2016 17:13    DISCHARGE EXAMINATION: Vitals:   10/04/16 1211 10/04/16 1941 10/05/16 0013 10/05/16 0430  BP:  (!) 162/54 (!) 148/48 (!) 137/40  Pulse:  66 64 61  Resp:  18 19 18   Temp:  98.1 F (36.7 C) 98.6 F (37 C) 98.7 F (37.1 C)  TempSrc:  Oral Oral   SpO2:  98% 96% 98%  Weight:    71.8 kg (158 lb 4.8 oz)  Height: 5\' 6"  (1.676 m)      General appearance: alert, cooperative, appears stated age and no distress Resp: clear to auscultation bilaterally Cardio: regular rate and rhythm, S1, S2 normal, no murmur, click, rub or gallop GI: soft, non-tender; bowel sounds normal; no masses,  no organomegaly  Telemetry shows improvement in heart rate now in the  60s. No arrhythmias noted.  DISPOSITION: SNF  Discharge Instructions    Call MD for:  difficulty breathing, headache or visual disturbances    Complete by:  As directed    Call MD for:  extreme fatigue    Complete by:  As directed    Call MD for:  persistant dizziness or light-headedness    Complete by:  As directed    Call MD for:  persistant nausea and vomiting    Complete by:  As directed    Call MD for:  severe uncontrolled pain    Complete by:  As directed    Call MD for:  temperature >100.4    Complete by:  As directed    Diet - low sodium heart healthy    Complete by:  As directed    Discharge instructions    Complete by:  As directed    Please see instructions on the Discharge summary.  You were cared for by a hospitalist during your hospital stay. If you have any questions about your discharge medications or the care you received while you were in the hospital after you are discharged, you can call the unit and asked to speak with the hospitalist on call if the hospitalist that took care of you is not available. Once you are discharged, your primary care physician will handle any  further medical issues. Please note that NO REFILLS for any discharge medications will be authorized once you are discharged, as it is imperative that you return to your primary care physician (or establish a relationship with a primary care physician if you do not have one) for your aftercare needs so that they can reassess your need for medications and monitor your lab values. If you do not have a primary care physician, you can call [phone] for a physician referral.   Increase activity slowly    Complete by:  As directed       ALLERGIES:  Allergies  Allergen Reactions  . Codeine Anaphylaxis and Nausea And Vomiting    Note: pt has tramadol for years with no reaction  . Morphine And Related Nausea And Vomiting    Severe nausea per daughter  . Valium [Diazepam] Shortness Of Breath  . Sulfa Antibiotics Itching and Rash  . Darvon [Propoxyphene] Nausea And Vomiting  . Floxin [Ofloxacin] Itching  . Levsin [Hyoscyamine Sulfate] Other (See Comments)    Unknown reaction  . Penicillins Itching    Itching reported by [facility] Has patient had a PCN reaction causing immediate rash, facial/tongue/throat swelling, SOB or lightheadedness with hypotension: Yes Has patient had a PCN reaction causing severe rash involving mucus membranes or skin necrosis: No Has patient had a PCN reaction that required hospitalization No Has patient had a PCN reaction occurring within the last 10 years: No If all of the above answers are "NO", then may proceed with Cephalosporin use.   . Zantac [Ranitidine Hcl] Other (See Comments)    Unknown reaction      Current Discharge Medication List    CONTINUE these medications which have CHANGED   Details  carvedilol (COREG) 3.125 MG tablet Take 1 tablet (3.125 mg total) by mouth 2 (two) times daily with a meal. PLEASE START THE LOWER DOSE ON Monday, 7/30 DEPENDING ON HER HEART RATE. OK TO START IF HEART RATE IS NOT LESS THAN 65.      polyethylene glycol powder (GLYCOLAX/MIRALAX) powder Take 17 g by mouth daily as needed for moderate constipation.  Qty: 3350 g, Refills: 1   Associated Diagnoses: Constipation, unspecified constipation type    QUEtiapine (SEROQUEL) 25 MG tablet Take 1 tablet (25 mg total) by mouth at bedtime. Qty: 30 tablet, Refills: 0    traMADol (ULTRAM) 50 MG tablet Take 1 tablet (50 mg total) by mouth See admin instructions. Take 50 mg by mouth three times a day at 8:00 am, 14:00 and 22:00 Also take 50 mg by mouth every four hours as needed for sever pain not to exceed total 4/day Qty: 30 tablet, Refills: 0    traZODone (DESYREL) 150 MG tablet Take 1 tablet (150 mg total) by mouth at bedtime. Qty: 30 tablet, Refills: 0    warfarin (COUMADIN) 1 MG tablet Take 1-4 tablets (1-4 mg total) by mouth daily. Take 2mg  daily starting 7/28. Check PT/INR on 7/30 and then dose accordingly.      CONTINUE these medications which have NOT CHANGED   Details  acetaminophen (TYLENOL) 500 MG tablet Take 500 mg by mouth every 4 (four) hours as needed for mild pain or headache.    albuterol (PROVENTIL) (2.5 MG/3ML) 0.083% nebulizer solution Take 3 mLs (2.5 mg total) by nebulization every 6 (six) hours as needed for wheezing or shortness of breath. Qty: 120 mL, Refills: 11    alum & mag hydroxide-simeth (MAALOX/MYLANTA) [phone] MG/5ML suspension Take 30 mLs by mouth as needed for indigestion.     atorvastatin (LIPITOR) 20 MG tablet Take 2 tablets (40 mg total) by mouth daily. Qty: 90 tablet, Refills: 3    Cranberry-Vitamin C-Probiotic (AZO CRANBERRY) 250-30 MG TABS Take 1 tablet by mouth daily.    digoxin (LANOXIN) 0.125 MG tablet Take 0.0625 mg by mouth daily. Hold id her pulse is less than 60    escitalopram (LEXAPRO) 10 MG tablet TAKE 1 TABLET(10 MG) BY MOUTH DAILY Qty: 90 tablet, Refills: 1    gabapentin (NEURONTIN) 100 MG capsule Take 100 mg by mouth at bedtime.    Melatonin 3 MG TABS Take 3 mg by mouth  at bedtime.    Neomycin-Bacitracin-Polymyxin ([employer] TRIPLE ANTIBIOTIC) [phone] OINT Apply 1 application topically as needed. For skin tear. Abrasions* Clean area with normal saline, apply TAO, cover with bandaid or Gauze and tape. Change as needed until healed.    sennosides-docusate sodium (SENOKOT-S) 8.6-50 MG tablet Take 1 tablet by mouth daily. Qty: 30 tablet, Refills: 11    guaifenesin (ROBITUSSIN) 100 MG/5ML syrup Take 200 mg by mouth every 6 (six) hours as needed for cough.    magnesium hydroxide (MILK OF MAGNESIA) 400 MG/5ML suspension Take 30 mLs by mouth daily as needed for mild constipation.    Psyllium (METAMUCIL) WAFR Take 2 Wafers by mouth daily. Prn constipation Qty: 24 Wafer, Refills: 6   Associated Diagnoses: Constipation, unspecified constipation type      STOP taking these medications     diltiazem (DILACOR XR) 120 MG 24 hr capsule      furosemide (LASIX) 20 MG tablet      potassium chloride SA (K-DUR,KLOR-CON) 20 MEQ tablet          TOTAL DISCHARGE TIME: 35 mins  [facility]  [facility] Pager [phone]  10/05/2016, 10:52 AM

## 2016-10-05 NOTE — NC FL2 (Signed)
[place] MEDICAID FL2 LEVEL OF CARE SCREENING TOOL     IDENTIFICATION  Patient Name: Jorene Kaylor Birthdate: 1932/01/12 Sex: female Admission Date (Current Location): 10/03/2016  [facility] and [place] Number:  Herbalist and Address:  [facility]. [facility], [place] [address] [place], [place] [address]      Provider Number: [id number]  Attending Physician Name and Address:  No att. providers found  Relative Name and Phone Number:       Current Level of Care: Hospital Recommended Level of Care: [place] Prior Approval Number:    Date Approved/Denied:   PASRR Number: N/A  Discharge Plan: Domiciliary (Rest home) [facility])    Current Diagnoses: Patient Active Problem List   Diagnosis Date Noted  . Functional dyspnea (2/2 posture/positioning w/ abnormal sprirometry) 10/03/2016  . Bipolar 1 disorder ([place]) 10/03/2016  . PAF (paroxysmal atrial fibrillation) ([place]) 10/03/2016  . Hypertension 10/03/2016  . Orthostatic syncope 10/03/2016  . Diarrhea 10/03/2016  . Chronic diastolic heart failure ([place]) 10/03/2016  . Supratherapeutic INR 10/03/2016  . Dementia 10/03/2016  . Hyperlipemia 10/03/2016  . CKD (chronic kidney disease) stage 3, GFR 30-59 ml/min 10/03/2016  . Symptomatic bradycardia   . Dyspnea 09/16/2016  . CHF (congestive heart failure) ([place]) 03/04/2016  . Acute renal failure superimposed on stage 3 chronic kidney disease ([place]) 02/08/2016  . Fall   . Syncope 12/05/2015  . Physical deconditioning 12/05/2015  . Encounter for therapeutic drug monitoring 12/22/2014  . Atrial fibrillation with RVR ([place]) 08/04/2014  . Paroxysmal atrial fibrillation ([place]) 08/04/2014  . Hypertensive urgency   . Obesity 09/27/2013  . Chronic back pain 09/14/2012  . Bipolar disorder ([place]) 09/14/2012    Orientation RESPIRATION BLADDER Height & Weight     Self, Time, Situation, Place  Normal Continent Weight: 158 lb 4.8 oz (71.8  kg) Height:  5\' 6"  (167.6 cm)  BEHAVIORAL SYMPTOMS/MOOD NEUROLOGICAL BOWEL NUTRITION STATUS  Other (Comment) (Memory Impaired)   Continent Diet (Regular diet; thin fluids)  AMBULATORY STATUS COMMUNICATION OF NEEDS Skin   Limited Assist Verbally Normal                       Personal Care Assistance Level of Assistance  Bathing, Feeding, Dressing Bathing Assistance: Limited assistance Feeding assistance: Independent Dressing Assistance: Limited assistance     Functional Limitations Info  Sight, Hearing, Speech Sight Info: Adequate Hearing Info: Adequate Speech Info: Adequate    SPECIAL CARE FACTORS FREQUENCY  PT (By licensed PT)     PT Frequency: 3x              Contractures Contractures Info: Not present    Additional Factors Info  Code Status, Allergies, Psychotropic Code Status Info: DNR Allergies Info: Codeine, Morphine And Related, Valium Diazepam, Sulfa Antibiotics, Darvon Propoxyphene, Floxin Ofloxacin, Levsin Hyoscyamine Sulfate, Penicillins, Zantac Ranitidine Hcl Psychotropic Info: See discharge summary         Current Medications (10/05/2016):  This is the current hospital active medication list Current Facility-Administered Medications  Medication Dose Route Frequency Provider Last Rate Last Dose  . 0.9 %  sodium chloride infusion   Intravenous Continuous Bonnielee Haff, MD 50 mL/hr at 10/04/16 1740 50 mL/hr at 10/04/16 1740  . acetaminophen (TYLENOL) tablet 650 mg  650 mg Oral Q6H PRN Samella Parr, NP       Or  . acetaminophen (TYLENOL) suppository 650 mg  650 mg Rectal Q6H PRN Samella Parr, NP      .  albuterol (PROVENTIL) (2.5 MG/3ML) 0.083% nebulizer solution 2.5 mg  2.5 mg Nebulization Q6H PRN Samella Parr, NP      . alum & mag hydroxide-simeth (MAALOX/MYLANTA) 200-200-20 MG/5ML suspension 30 mL  30 mL Oral PRN Samella Parr, NP      . atorvastatin (LIPITOR) tablet 40 mg  40 mg Oral Daily Samella Parr, NP   40 mg at 10/05/16  1109  . escitalopram (LEXAPRO) tablet 10 mg  10 mg Oral QHS Samella Parr, NP   10 mg at 10/04/16 2146  . gabapentin (NEURONTIN) capsule 100 mg  100 mg Oral QHS Samella Parr, NP   100 mg at 10/04/16 2146  . lidocaine (LIDODERM) 5 % 1 patch  1 patch Transdermal Q24H Samella Parr, NP   1 patch at 10/04/16 1736  . magnesium hydroxide (MILK OF MAGNESIA) suspension 30 mL  30 mL Oral Daily PRN Samella Parr, NP      . Melatonin TABS 3 mg  3 mg Oral QHS Samella Parr, NP   3 mg at 10/04/16 2145  . ondansetron (ZOFRAN) tablet 4 mg  4 mg Oral Q6H PRN Samella Parr, NP       Or  . ondansetron [facility]) injection 4 mg  4 mg Intravenous Q6H PRN Samella Parr, NP      . QUEtiapine (SEROQUEL) tablet 25 mg  25 mg Oral QHS Samella Parr, NP   25 mg at 10/04/16 2146  . sodium chloride flush (NS) 0.9 % injection 3 mL  3 mL Intravenous Q12H Samella Parr, NP      . traMADol Veatrice Bourbon) tablet 50 mg  50 mg Oral Q6H PRN Samella Parr, NP   50 mg at 10/05/16 1109  . traZODone (DESYREL) tablet 150 mg  150 mg Oral QHS Samella Parr, NP   150 mg at 10/04/16 2146   Current Outpatient Prescriptions  Medication Sig Dispense Refill  . acetaminophen (TYLENOL) 500 MG tablet Take 500 mg by mouth every 4 (four) hours as needed for mild pain or headache.    . albuterol (PROVENTIL) (2.5 MG/3ML) 0.083% nebulizer solution Take 3 mLs (2.5 mg total) by nebulization every 6 (six) hours as needed for wheezing or shortness of breath. 120 mL 11  . alum & mag hydroxide-simeth (MAALOX/MYLANTA) 200-200-20 MG/5ML suspension Take 30 mLs by mouth as needed for indigestion.     Marland Kitchen atorvastatin (LIPITOR) 20 MG tablet Take 2 tablets (40 mg total) by mouth daily. 90 tablet 3  . Cranberry-Vitamin C-Probiotic (AZO CRANBERRY) 250-30 MG TABS Take 1 tablet by mouth daily.    . digoxin (LANOXIN) 0.125 MG tablet Take 0.0625 mg by mouth daily. Hold id her pulse is less than 60    . escitalopram (LEXAPRO) 10 MG tablet TAKE 1  TABLET(10 MG) BY MOUTH DAILY (Patient taking differently: TAKE 1 TABLET(20 MG) BY MOUTH DAILY) 90 tablet 1  . gabapentin (NEURONTIN) 100 MG capsule Take 100 mg by mouth at bedtime.    . Melatonin 3 MG TABS Take 3 mg by mouth at bedtime.    Marland Kitchen Neomycin-Bacitracin-Polymyxin ([employer] TRIPLE ANTIBIOTIC) [phone] OINT Apply 1 application topically as needed. For skin tear. Abrasions* Clean area with normal saline, apply TAO, cover with bandaid or Gauze and tape. Change as needed until healed.    . sennosides-docusate sodium (SENOKOT-S) 8.6-50 MG tablet Take 1 tablet by mouth daily. 30 tablet 11  . [START ON 10/07/2016] carvedilol (COREG) 3.125 MG tablet  Take 1 tablet (3.125 mg total) by mouth 2 (two) times daily with a meal. PLEASE START THE LOWER DOSE ON Monday, 7/30 DEPENDING ON HER HEART RATE. OK TO START IF HEART RATE IS NOT LESS THAN 65.    . guaifenesin (ROBITUSSIN) 100 MG/5ML syrup Take 200 mg by mouth every 6 (six) hours as needed for cough.    . magnesium hydroxide (MILK OF MAGNESIA) 400 MG/5ML suspension Take 30 mLs by mouth daily as needed for mild constipation.    . polyethylene glycol powder (GLYCOLAX/MIRALAX) powder Take 17 g by mouth daily as needed for moderate constipation. 3350 g 1  . Psyllium (METAMUCIL) WAFR Take 2 Wafers by mouth daily. Prn constipation 24 Wafer 6  . QUEtiapine (SEROQUEL) 25 MG tablet Take 1 tablet (25 mg total) by mouth at bedtime. 30 tablet 0  . traMADol (ULTRAM) 50 MG tablet Take 1 tablet (50 mg total) by mouth See admin instructions. Take 50 mg by mouth three times a day at 8:00 am, 14:00 and 22:00 Also take 50 mg by mouth every four hours as needed for sever pain not to exceed total 4/day 30 tablet 0  . traZODone (DESYREL) 150 MG tablet Take 1 tablet (150 mg total) by mouth at bedtime. 30 tablet 0  . warfarin (COUMADIN) 1 MG tablet Take 1-4 tablets (1-4 mg total) by mouth daily. Take 2mg  daily starting 7/28. Check PT/INR on 7/30 and then dose accordingly.        Discharge Medications: Please see discharge summary for a list of discharge medications.  Relevant Imaging Results:  Relevant Lab Results:   Additional Information SSN: [id number]  Truitt Merle, LCSW

## 2016-10-07 DIAGNOSIS — R001 Bradycardia, unspecified: Secondary | ICD-10-CM | POA: Diagnosis not present

## 2016-10-07 DIAGNOSIS — R55 Syncope and collapse: Secondary | ICD-10-CM | POA: Diagnosis not present

## 2016-10-07 DIAGNOSIS — Z79899 Other long term (current) drug therapy: Secondary | ICD-10-CM | POA: Diagnosis not present

## 2016-10-07 DIAGNOSIS — I480 Paroxysmal atrial fibrillation: Secondary | ICD-10-CM | POA: Diagnosis not present

## 2016-10-09 DIAGNOSIS — I480 Paroxysmal atrial fibrillation: Secondary | ICD-10-CM | POA: Diagnosis not present

## 2016-10-09 DIAGNOSIS — N390 Urinary tract infection, site not specified: Secondary | ICD-10-CM | POA: Diagnosis not present

## 2016-10-09 DIAGNOSIS — D649 Anemia, unspecified: Secondary | ICD-10-CM | POA: Diagnosis not present

## 2016-10-09 DIAGNOSIS — Z7901 Long term (current) use of anticoagulants: Secondary | ICD-10-CM | POA: Diagnosis not present

## 2016-10-09 DIAGNOSIS — I4891 Unspecified atrial fibrillation: Secondary | ICD-10-CM | POA: Diagnosis not present

## 2016-10-16 DIAGNOSIS — I4891 Unspecified atrial fibrillation: Secondary | ICD-10-CM | POA: Diagnosis not present

## 2016-10-16 DIAGNOSIS — Z7901 Long term (current) use of anticoagulants: Secondary | ICD-10-CM | POA: Diagnosis not present

## 2016-10-17 DIAGNOSIS — M6281 Muscle weakness (generalized): Secondary | ICD-10-CM | POA: Diagnosis not present

## 2016-10-23 DIAGNOSIS — Z7901 Long term (current) use of anticoagulants: Secondary | ICD-10-CM | POA: Diagnosis not present

## 2016-10-23 DIAGNOSIS — I4891 Unspecified atrial fibrillation: Secondary | ICD-10-CM | POA: Diagnosis not present

## 2016-10-23 DIAGNOSIS — B351 Tinea unguium: Secondary | ICD-10-CM | POA: Diagnosis not present

## 2016-10-23 DIAGNOSIS — M79674 Pain in right toe(s): Secondary | ICD-10-CM | POA: Diagnosis not present

## 2016-10-29 DIAGNOSIS — I4891 Unspecified atrial fibrillation: Secondary | ICD-10-CM | POA: Diagnosis not present

## 2016-10-29 DIAGNOSIS — Z7901 Long term (current) use of anticoagulants: Secondary | ICD-10-CM | POA: Diagnosis not present

## 2016-11-04 DIAGNOSIS — I10 Essential (primary) hypertension: Secondary | ICD-10-CM | POA: Diagnosis not present

## 2016-11-04 DIAGNOSIS — I480 Paroxysmal atrial fibrillation: Secondary | ICD-10-CM | POA: Diagnosis not present

## 2016-11-04 DIAGNOSIS — R001 Bradycardia, unspecified: Secondary | ICD-10-CM | POA: Diagnosis not present

## 2016-11-04 DIAGNOSIS — Z79899 Other long term (current) drug therapy: Secondary | ICD-10-CM | POA: Diagnosis not present

## 2016-11-06 DIAGNOSIS — Z7901 Long term (current) use of anticoagulants: Secondary | ICD-10-CM | POA: Diagnosis not present

## 2016-11-06 DIAGNOSIS — I4891 Unspecified atrial fibrillation: Secondary | ICD-10-CM | POA: Diagnosis not present

## 2016-11-13 ENCOUNTER — Ambulatory Visit: Payer: Medicare Other | Admitting: Acute Care

## 2016-11-14 DIAGNOSIS — Z7901 Long term (current) use of anticoagulants: Secondary | ICD-10-CM | POA: Diagnosis not present

## 2016-11-14 DIAGNOSIS — I4891 Unspecified atrial fibrillation: Secondary | ICD-10-CM | POA: Diagnosis not present

## 2016-11-20 DIAGNOSIS — Z7901 Long term (current) use of anticoagulants: Secondary | ICD-10-CM | POA: Diagnosis not present

## 2016-11-20 DIAGNOSIS — I4891 Unspecified atrial fibrillation: Secondary | ICD-10-CM | POA: Diagnosis not present

## 2016-11-24 DIAGNOSIS — Z23 Encounter for immunization: Secondary | ICD-10-CM | POA: Diagnosis not present

## 2016-11-27 DIAGNOSIS — N390 Urinary tract infection, site not specified: Secondary | ICD-10-CM | POA: Diagnosis not present

## 2016-11-27 DIAGNOSIS — I480 Paroxysmal atrial fibrillation: Secondary | ICD-10-CM | POA: Diagnosis not present

## 2016-11-27 DIAGNOSIS — Z7901 Long term (current) use of anticoagulants: Secondary | ICD-10-CM | POA: Diagnosis not present

## 2016-11-27 DIAGNOSIS — I4891 Unspecified atrial fibrillation: Secondary | ICD-10-CM | POA: Diagnosis not present

## 2016-11-28 ENCOUNTER — Encounter: Payer: Self-pay | Admitting: Internal Medicine

## 2016-12-04 DIAGNOSIS — Z7901 Long term (current) use of anticoagulants: Secondary | ICD-10-CM | POA: Diagnosis not present

## 2016-12-04 DIAGNOSIS — I4891 Unspecified atrial fibrillation: Secondary | ICD-10-CM | POA: Diagnosis not present

## 2016-12-25 DIAGNOSIS — I4891 Unspecified atrial fibrillation: Secondary | ICD-10-CM | POA: Diagnosis not present

## 2016-12-25 DIAGNOSIS — Z7901 Long term (current) use of anticoagulants: Secondary | ICD-10-CM | POA: Diagnosis not present

## 2017-01-01 DIAGNOSIS — I4891 Unspecified atrial fibrillation: Secondary | ICD-10-CM | POA: Diagnosis not present

## 2017-01-01 DIAGNOSIS — Z7901 Long term (current) use of anticoagulants: Secondary | ICD-10-CM | POA: Diagnosis not present

## 2017-01-08 ENCOUNTER — Encounter (HOSPITAL_COMMUNITY): Payer: Self-pay | Admitting: Emergency Medicine

## 2017-01-08 ENCOUNTER — Emergency Department (HOSPITAL_COMMUNITY)
Admission: EM | Admit: 2017-01-08 | Discharge: 2017-01-08 | Disposition: A | Discharge status: Home / Self Care | Payer: Medicare Other | Attending: Emergency Medicine | Admitting: Emergency Medicine

## 2017-01-08 DIAGNOSIS — I5032 Chronic diastolic (congestive) heart failure: Secondary | ICD-10-CM | POA: Diagnosis not present

## 2017-01-08 DIAGNOSIS — N183 Chronic kidney disease, stage 3 (moderate): Secondary | ICD-10-CM | POA: Diagnosis not present

## 2017-01-08 DIAGNOSIS — Z79899 Other long term (current) drug therapy: Secondary | ICD-10-CM | POA: Insufficient documentation

## 2017-01-08 DIAGNOSIS — Z853 Personal history of malignant neoplasm of breast: Secondary | ICD-10-CM | POA: Insufficient documentation

## 2017-01-08 DIAGNOSIS — I130 Hypertensive heart and chronic kidney disease with heart failure and stage 1 through stage 4 chronic kidney disease, or unspecified chronic kidney disease: Secondary | ICD-10-CM | POA: Diagnosis not present

## 2017-01-08 DIAGNOSIS — R040 Epistaxis: Secondary | ICD-10-CM | POA: Insufficient documentation

## 2017-01-08 LAB — CBC
HCT: 39.6 % (ref 36.0–46.0)
Hemoglobin: 12.6 g/dL (ref 12.0–15.0)
MCH: 28.1 pg (ref 26.0–34.0)
MCHC: 31.8 g/dL (ref 30.0–36.0)
MCV: 88.4 fL (ref 78.0–100.0)
Platelets: 185 10*3/uL (ref 150–400)
RBC: 4.48 MIL/uL (ref 3.87–5.11)
RDW: 14.6 % (ref 11.5–15.5)
WBC: 6 10*3/uL (ref 4.0–10.5)

## 2017-01-08 LAB — PROTIME-INR
INR: 2.16
Prothrombin Time: 23.9 seconds — ABNORMAL HIGH (ref 11.4–15.2)

## 2017-01-08 MED ORDER — LIDOCAINE-EPINEPHRINE 2 %-1:200000 IJ SOLN
10.0000 mL | Freq: Once | INTRAMUSCULAR | Status: AC
Start: 2017-01-08 — End: 2017-01-08
  Administered 2017-01-08: 10 mL
  Filled 2017-01-08: qty 20

## 2017-01-08 MED ORDER — OXYMETAZOLINE HCL 0.05 % NA SOLN
1.0000 | Freq: Once | NASAL | Status: AC
  Administered 2017-01-08: 1 via NASAL
  Filled 2017-01-08: qty 15

## 2017-01-08 MED ORDER — TRAMADOL HCL 50 MG PO TABS
50.0000 mg | ORAL_TABLET | Freq: Once | ORAL | Status: AC
  Administered 2017-01-08: 50 mg via ORAL
  Filled 2017-01-08: qty 1

## 2017-01-08 NOTE — Discharge Instructions (Signed)
You need to have this removed in the next 48 hours

## 2017-01-08 NOTE — ED Provider Notes (Signed)
[facility] EMERGENCY DEPARTMENT Provider Note   CSN: [id number] Arrival date & time: 01/08/17  0456     History   Chief Complaint Chief Complaint  Patient presents with  . Epistaxis    HPI Tracy Rodriguez is a 83 y.o. female.  The history is provided by the patient.  Epistaxis   This is a new problem. The current episode started 1 to 2 hours ago. The problem occurs constantly. The problem has been gradually worsening. The problem is associated with anticoagulants. The bleeding has been from the left nare. She has tried applying pressure for the symptoms. The treatment provided mild relief.   Pt reports abrupt onset of bleeding from left nare This occurred spontaneously She has no pain There is some blood in her oropharynx No weakness/dizziness No other complaints Past Medical History:  Diagnosis Date  . Arthritis    "joints" (08/04/2014)  . Bipolar 1 disorder ([place])   . Cancer of right breast ([place])   . CHF (congestive heart failure) ([place])   . Chronic lower back pain   . Hyperlipemia   . Hypertension   . PAF (paroxysmal atrial fibrillation) [facility])     Patient Active Problem List   Diagnosis Date Noted  . Functional dyspnea (2/2 posture/positioning w/ abnormal sprirometry) 10/03/2016  . Bipolar 1 disorder ([place]) 10/03/2016  . PAF (paroxysmal atrial fibrillation) ([place]) 10/03/2016  . Hypertension 10/03/2016  . Orthostatic syncope 10/03/2016  . Diarrhea 10/03/2016  . Chronic diastolic heart failure ([place]) 10/03/2016  . Supratherapeutic INR 10/03/2016  . Dementia 10/03/2016  . Hyperlipemia 10/03/2016  . CKD (chronic kidney disease) stage 3, GFR 30-59 ml/min (HCC) 10/03/2016  . Symptomatic bradycardia   . Dyspnea 09/16/2016  . CHF (congestive heart failure) ([place]) 03/04/2016  . Acute renal failure superimposed on stage 3 chronic kidney disease ([place]) 02/08/2016  . Fall   . Syncope 12/05/2015  . Physical deconditioning 12/05/2015  . Encounter for  therapeutic drug monitoring 12/22/2014  . Atrial fibrillation with RVR ([place]) 08/04/2014  . Paroxysmal atrial fibrillation ([place]) 08/04/2014  . Hypertensive urgency   . Obesity 09/27/2013  . Chronic back pain 09/14/2012  . Bipolar disorder ([place]) 09/14/2012    Past Surgical History:  Procedure Laterality Date  . AUGMENTATION MAMMAPLASTY Bilateral   . BACK SURGERY    . BREAST BIOPSY Right   . BREAST RECONSTRUCTION Bilateral   . CATARACT EXTRACTION W/ INTRAOCULAR LENS  IMPLANT, BILATERAL Bilateral   . EXCISIONAL HEMORRHOIDECTOMY    . [facility]; 1973; 1985   "ruptured discs each time"  . MASTECTOMY Right    cancer  . PLACEMENT OF BREAST IMPLANTS Bilateral    "had to take tissue out of left"  . TONSILLECTOMY    . VAGINAL HYSTERECTOMY      OB History    No data available       Home Medications    Prior to Admission medications   Medication Sig Start Date End Date Taking? Authorizing Provider  acetaminophen (TYLENOL) 500 MG tablet Take 500 mg by mouth every 4 (four) hours as needed for mild pain or headache.    [provider]  albuterol (PROVENTIL) (2.5 MG/3ML) 0.083% nebulizer solution Take 3 mLs (2.5 mg total) by nebulization every 6 (six) hours as needed for wheezing or shortness of breath. 09/16/16   Magdalen Spatz, NP  alum & mag hydroxide-simeth (MAALOX/MYLANTA) 200-200-20 MG/5ML suspension Take 30 mLs by mouth as needed for indigestion.     [provider]  atorvastatin (LIPITOR) 20 MG tablet Take 2 tablets (40 mg total) by mouth daily. 02/12/16   Bonnielee Haff, MD  carvedilol (COREG) 3.125 MG tablet Take 1 tablet (3.125 mg total) by mouth 2 (two) times daily with a meal. PLEASE START THE LOWER DOSE ON Monday, 7/30 DEPENDING ON HER HEART RATE. OK TO START IF HEART RATE IS NOT LESS THAN 65. 10/07/16   Bonnielee Haff, MD  Cranberry-Vitamin C-Probiotic (AZO CRANBERRY) 250-30 MG TABS Take 1 tablet by mouth daily.    [provider]    digoxin (LANOXIN) 0.125 MG tablet Take 0.0625 mg by mouth daily. Hold id her pulse is less than 60    [provider]  escitalopram (LEXAPRO) 10 MG tablet TAKE 1 TABLET(10 MG) BY MOUTH DAILY Patient taking differently: TAKE 1 TABLET(20 MG) BY MOUTH DAILY 02/08/16   Marletta Lor, MD  gabapentin (NEURONTIN) 100 MG capsule Take 100 mg by mouth at bedtime.    [provider]  guaifenesin (ROBITUSSIN) 100 MG/5ML syrup Take 200 mg by mouth every 6 (six) hours as needed for cough.    [provider]  magnesium hydroxide (MILK OF MAGNESIA) 400 MG/5ML suspension Take 30 mLs by mouth daily as needed for mild constipation.    [provider]  Melatonin 3 MG TABS Take 3 mg by mouth at bedtime.    [provider]  Neomycin-Bacitracin-Polymyxin ([employer] TRIPLE ANTIBIOTIC) [phone] OINT Apply 1 application topically as needed. For skin tear. Abrasions* Clean area with normal saline, apply TAO, cover with bandaid or Gauze and tape. Change as needed until healed.    [provider]  polyethylene glycol powder (GLYCOLAX/MIRALAX) powder Take 17 g by mouth daily as needed for moderate constipation. 10/05/16   Bonnielee Haff, MD  Psyllium (METAMUCIL) WAFR Take 2 Wafers by mouth daily. Prn constipation 03/25/16   Briscoe Deutscher, DO  QUEtiapine (SEROQUEL) 25 MG tablet Take 1 tablet (25 mg total) by mouth at bedtime. 10/05/16   Bonnielee Haff, MD  sennosides-docusate sodium (SENOKOT-S) 8.6-50 MG tablet Take 1 tablet by mouth daily. 05/28/16   Marletta Lor, MD  traMADol (ULTRAM) 50 MG tablet Take 1 tablet (50 mg total) by mouth See admin instructions. Take 50 mg by mouth three times a day at 8:00 am, 14:00 and 22:00 Also take 50 mg by mouth every four hours as needed for sever pain not to exceed total 4/day 10/05/16   Bonnielee Haff, MD  traZODone (DESYREL) 150 MG tablet Take 1 tablet (150 mg total) by mouth at bedtime. 10/05/16   Bonnielee Haff, MD   warfarin (COUMADIN) 1 MG tablet Take 1-4 tablets (1-4 mg total) by mouth daily. Take 2mg  daily starting 7/28. Check PT/INR on 7/30 and then dose accordingly. 10/05/16   Bonnielee Haff, MD    Family History Family History  Problem Relation Age of Onset  . Stroke Mother   . Heart disease Father   . Alzheimer's disease Sister   . Leukemia Brother   . Arthritis Maternal Grandmother   . Breast cancer Daughter     Social History Social History  Substance Use Topics  . Smoking status: Never Smoker  . Smokeless tobacco: Never Used  . Alcohol use No     Allergies   Codeine; Morphine and related; Valium [diazepam]; Sulfa antibiotics; Darvon [propoxyphene]; Floxin [ofloxacin]; Levsin [hyoscyamine sulfate]; Penicillins; and Zantac [ranitidine hcl]   Review of Systems Review of Systems  HENT: Positive for nosebleeds.   Gastrointestinal: Negative for vomiting.  Neurological: Negative for weakness.  All other systems reviewed and are negative.    Physical Exam Updated Vital Signs BP (!) 171/111 (BP Location: Right Arm)   Pulse 61   Temp 98.1 F (36.7 C) (Oral)   Resp 16   Ht 1.676 m (5\' 6" )   Wt 68 kg (150 lb)   LMP 10/10/1969   SpO2 97%   BMI 24.21 kg/m   Physical Exam CONSTITUTIONAL: elderly, frail, no distress HEAD: Normocephalic/atraumatic EYES: EOMI/PERRL ENMT: Mucous membranes moist, blood in left nare, no blood in oropharynx NECK: supple no meningeal signs CV: S1/S2 noted LUNGS: Lungs are clear to auscultation bilaterally, no apparent distress ABDOMEN: soft NEURO: Pt is awake/alert/appropriate, moves all extremitiesx4.   EXTREMITIES:  full ROM SKIN: warm, color normal PSYCH: no abnormalities of mood noted, alert and oriented to situation   ED Treatments / Results  Labs (all labs ordered are listed, but only abnormal results are displayed) Labs Reviewed  PROTIME-INR - Abnormal; Notable for the following:       Result Value   Prothrombin Time 23.9 (*)     All other components within normal limits  CBC    EKG  EKG Interpretation None       Radiology No results found.  Procedures .Epistaxis Management Date/Time: 01/08/2017 7:22 AM Performed by: Ripley Fraise Authorized by: Ripley Fraise   Consent:    Consent obtained:  Verbal   Consent given by:  Patient   Risks discussed:  Bleeding and pain Anesthesia (see MAR for exact dosages):    Anesthesia method:  Topical application   Topical anesthesia: lidocaine with epinephrine. Procedure details:    Treatment site:  L anterior   Treatment method:  Anterior pack   Treatment complexity:  Limited   Treatment episode: initial   Post-procedure details:    Assessment:  Bleeding stopped   Patient tolerance of procedure:  Tolerated well, no immediate complications   (including critical care time)  Medications Ordered in ED Medications  oxymetazoline (AFRIN) 0.05 % nasal spray 1 spray (1 spray Left Nare Given 01/08/17 0623)  lidocaine-EPINEPHrine (XYLOCAINE W/EPI) 2 %-1:200000 (PF) injection 10 mL (10 mLs Other Given 01/08/17 0704)     Initial Impression / Assessment and Plan / ED Course  I have reviewed the triage vital signs and the nursing notes.  Pertinent labs  results that were available during my care of the patient were reviewed by me and considered in my medical decision making (see chart for details).   pt stable After afrin, nasal suctioning, pt still with fresh blood in nare Due to use of coumadin, I feel nasal packing warranted Pt tolerated packing well Will d/c back to facility with removal of packing in 2 days     Final Clinical Impressions(s) / ED Diagnoses   Final diagnoses:  Left-sided epistaxis    New Prescriptions New Prescriptions   No medications on file     Ripley Fraise, MD 01/08/17 [phone]

## 2017-01-08 NOTE — ED Triage Notes (Signed)
Pt from [facility] with nose bleed, resolved at this time. Hypertensive in triage. Coumadin pt.

## 2017-01-10 ENCOUNTER — Emergency Department (HOSPITAL_COMMUNITY)
Admission: EM | Admit: 2017-01-10 | Discharge: 2017-01-10 | Disposition: A | Discharge status: Home / Self Care | Payer: Medicare Other | Attending: Emergency Medicine | Admitting: Emergency Medicine

## 2017-01-10 ENCOUNTER — Encounter (HOSPITAL_COMMUNITY): Payer: Self-pay | Admitting: Emergency Medicine

## 2017-01-10 DIAGNOSIS — I130 Hypertensive heart and chronic kidney disease with heart failure and stage 1 through stage 4 chronic kidney disease, or unspecified chronic kidney disease: Secondary | ICD-10-CM | POA: Diagnosis not present

## 2017-01-10 DIAGNOSIS — I5032 Chronic diastolic (congestive) heart failure: Secondary | ICD-10-CM | POA: Diagnosis not present

## 2017-01-10 DIAGNOSIS — N183 Chronic kidney disease, stage 3 (moderate): Secondary | ICD-10-CM | POA: Insufficient documentation

## 2017-01-10 DIAGNOSIS — Z79899 Other long term (current) drug therapy: Secondary | ICD-10-CM | POA: Diagnosis not present

## 2017-01-10 DIAGNOSIS — R040 Epistaxis: Secondary | ICD-10-CM | POA: Diagnosis not present

## 2017-01-10 DIAGNOSIS — F0390 Unspecified dementia without behavioral disturbance: Secondary | ICD-10-CM | POA: Insufficient documentation

## 2017-01-10 DIAGNOSIS — Z09 Encounter for follow-up examination after completed treatment for conditions other than malignant neoplasm: Secondary | ICD-10-CM | POA: Insufficient documentation

## 2017-01-10 DIAGNOSIS — Z7901 Long term (current) use of anticoagulants: Secondary | ICD-10-CM | POA: Insufficient documentation

## 2017-01-10 DIAGNOSIS — Z4800 Encounter for change or removal of nonsurgical wound dressing: Secondary | ICD-10-CM | POA: Diagnosis not present

## 2017-01-10 NOTE — Discharge Instructions (Signed)
Your nasal packing have been removed.  Apply vaseline as needed into the lining of your nose to decrease risk of nose bleed.  Follow up with your primary care provider for further evaluation of your health.

## 2017-01-10 NOTE — ED Triage Notes (Signed)
Pt returning to ER to have nasal packing removed that was placed 2 days ago for nose bleed. CNA with patient from [facility] assisted living states "that isn't a skill we do so they told us to come back here." pt has no complaints.

## 2017-01-10 NOTE — ED Provider Notes (Signed)
[facility] EMERGENCY DEPARTMENT Provider Note   CSN: [id number] Arrival date & time: 01/10/17  4010     History   Chief Complaint Chief Complaint  Patient presents with  . Follow-up    HPI Tracy Rodriguez is a 83 y.o. female.  HPI   83 year old female who is currently on Coumadin for atrial fibrillation presenting requesting for packing removal of her recent epistaxis.  Had epistaxis involving her left nares which she was seen and treated in the ER 2 days ago.  A nasal packing was placed.  She was recommended to remove in 2 days.  She is currently living at [facility] and was brought here for packing removal.  No other complaint except feeling tired.   Past Medical History:  Diagnosis Date  . Arthritis    "joints" (08/04/2014)  . Bipolar 1 disorder ([place])   . Cancer of right breast ([place])   . CHF (congestive heart failure) ([place])   . Chronic lower back pain   . Hyperlipemia   . Hypertension   . PAF (paroxysmal atrial fibrillation) [facility])     Patient Active Problem List   Diagnosis Date Noted  . Functional dyspnea (2/2 posture/positioning w/ abnormal sprirometry) 10/03/2016  . Bipolar 1 disorder ([place]) 10/03/2016  . PAF (paroxysmal atrial fibrillation) ([place]) 10/03/2016  . Hypertension 10/03/2016  . Orthostatic syncope 10/03/2016  . Diarrhea 10/03/2016  . Chronic diastolic heart failure ([place]) 10/03/2016  . Supratherapeutic INR 10/03/2016  . Dementia 10/03/2016  . Hyperlipemia 10/03/2016  . CKD (chronic kidney disease) stage 3, GFR 30-59 ml/min (HCC) 10/03/2016  . Symptomatic bradycardia   . Dyspnea 09/16/2016  . CHF (congestive heart failure) ([place]) 03/04/2016  . Acute renal failure superimposed on stage 3 chronic kidney disease ([place]) 02/08/2016  . Fall   . Syncope 12/05/2015  . Physical deconditioning 12/05/2015  . Encounter for therapeutic drug monitoring 12/22/2014  . Atrial fibrillation with RVR ([place]) 08/04/2014  .  Paroxysmal atrial fibrillation ([place]) 08/04/2014  . Hypertensive urgency   . Obesity 09/27/2013  . Chronic back pain 09/14/2012  . Bipolar disorder ([place]) 09/14/2012    Past Surgical History:  Procedure Laterality Date  . AUGMENTATION MAMMAPLASTY Bilateral   . BACK SURGERY    . BREAST BIOPSY Right   . BREAST RECONSTRUCTION Bilateral   . CATARACT EXTRACTION W/ INTRAOCULAR LENS  IMPLANT, BILATERAL Bilateral   . EXCISIONAL HEMORRHOIDECTOMY    . [place]; 1973; 1985   "ruptured discs each time"  . MASTECTOMY Right    cancer  . PLACEMENT OF BREAST IMPLANTS Bilateral    "had to take tissue out of left"  . TONSILLECTOMY    . VAGINAL HYSTERECTOMY      OB History    No data available       Home Medications    Prior to Admission medications   Medication Sig Start Date End Date Taking? Authorizing Provider  acetaminophen (TYLENOL) 500 MG tablet Take 500 mg by mouth every 4 (four) hours as needed for mild pain or headache.    [provider]  albuterol (PROVENTIL) (2.5 MG/3ML) 0.083% nebulizer solution Take 3 mLs (2.5 mg total) by nebulization every 6 (six) hours as needed for wheezing or shortness of breath. 09/16/16   Magdalen Spatz, NP  alum & mag hydroxide-simeth (MAALOX/MYLANTA) 200-200-20 MG/5ML suspension Take 30 mLs by mouth as needed for indigestion.     [provider]  atorvastatin (LIPITOR) 20 MG tablet Take  2 tablets (40 mg total) by mouth daily. 02/12/16   Bonnielee Haff, MD  carvedilol (COREG) 3.125 MG tablet Take 1 tablet (3.125 mg total) by mouth 2 (two) times daily with a meal. PLEASE START THE LOWER DOSE ON Monday, 7/30 DEPENDING ON HER HEART RATE. OK TO START IF HEART RATE IS NOT LESS THAN 65. 10/07/16   Bonnielee Haff, MD  Cranberry-Vitamin C-Probiotic (AZO CRANBERRY) 250-30 MG TABS Take 1 tablet by mouth daily.    [provider]  digoxin (LANOXIN) 0.125 MG tablet Take 0.0625 mg by mouth daily. Hold id her pulse is less than  60    [provider]  escitalopram (LEXAPRO) 10 MG tablet TAKE 1 TABLET(10 MG) BY MOUTH DAILY Patient taking differently: TAKE 1 TABLET(20 MG) BY MOUTH DAILY 02/08/16   Marletta Lor, MD  gabapentin (NEURONTIN) 100 MG capsule Take 100 mg by mouth at bedtime.    [provider]  guaifenesin (ROBITUSSIN) 100 MG/5ML syrup Take 200 mg by mouth every 6 (six) hours as needed for cough.    [provider]  magnesium hydroxide (MILK OF MAGNESIA) 400 MG/5ML suspension Take 30 mLs by mouth daily as needed for mild constipation.    [provider]  Melatonin 3 MG TABS Take 3 mg by mouth at bedtime.    [provider]  Neomycin-Bacitracin-Polymyxin ([employer] TRIPLE ANTIBIOTIC) [phone] OINT Apply 1 application topically as needed. For skin tear. Abrasions* Clean area with normal saline, apply TAO, cover with bandaid or Gauze and tape. Change as needed until healed.    [provider]  polyethylene glycol powder (GLYCOLAX/MIRALAX) powder Take 17 g by mouth daily as needed for moderate constipation. 10/05/16   Bonnielee Haff, MD  Psyllium (METAMUCIL) WAFR Take 2 Wafers by mouth daily. Prn constipation 03/25/16   Briscoe Deutscher, DO  QUEtiapine (SEROQUEL) 25 MG tablet Take 1 tablet (25 mg total) by mouth at bedtime. 10/05/16   Bonnielee Haff, MD  sennosides-docusate sodium (SENOKOT-S) 8.6-50 MG tablet Take 1 tablet by mouth daily. 05/28/16   Marletta Lor, MD  traMADol (ULTRAM) 50 MG tablet Take 1 tablet (50 mg total) by mouth See admin instructions. Take 50 mg by mouth three times a day at 8:00 am, 14:00 and 22:00 Also take 50 mg by mouth every four hours as needed for sever pain not to exceed total 4/day 10/05/16   Bonnielee Haff, MD  traZODone (DESYREL) 150 MG tablet Take 1 tablet (150 mg total) by mouth at bedtime. 10/05/16   Bonnielee Haff, MD  warfarin (COUMADIN) 1 MG tablet Take 1-4 tablets (1-4 mg total) by mouth daily. Take 2mg  daily starting  7/28. Check PT/INR on 7/30 and then dose accordingly. 10/05/16   Bonnielee Haff, MD    Family History Family History  Problem Relation Age of Onset  . Stroke Mother   . Heart disease Father   . Alzheimer's disease Sister   . Leukemia Brother   . Arthritis Maternal Grandmother   . Breast cancer Daughter     Social History Social History  Substance Use Topics  . Smoking status: Never Smoker  . Smokeless tobacco: Never Used  . Alcohol use No     Allergies   Codeine; Morphine and related; Valium [diazepam]; Sulfa antibiotics; Darvon [propoxyphene]; Floxin [ofloxacin]; Levsin [hyoscyamine sulfate]; Penicillins; and Zantac [ranitidine hcl]   Review of Systems Review of Systems  HENT: Negative for nosebleeds.   Skin: Negative for wound.  Neurological: Negative for headaches.     Physical  Exam Updated Vital Signs BP 126/75 (BP Location: Left Arm)   Pulse 80   Temp 98.5 F (36.9 C) (Oral)   Resp 17   LMP 10/10/1969   SpO2 99%   Physical Exam  Constitutional: She appears well-developed and well-nourished. No distress.  HENT:  Head: Atraumatic.  Nasal packing in L nares.  No active bleeding, nontender to palpation.   Eyes: Conjunctivae are normal.  Neck: Neck supple.  Neurological: She is alert.  Skin: No rash noted.  Psychiatric: She has a normal mood and affect.  Nursing note and vitals reviewed.    ED Treatments / Results  Labs (all labs ordered are listed, but only abnormal results are displayed) Labs Reviewed - No data to display  EKG  EKG Interpretation None       Radiology No results found.  Procedures Procedures (including critical care time)  Medications Ordered in ED Medications - No data to display   Initial Impression / Assessment and Plan / ED Course  I have reviewed the triage vital signs and the nursing notes.  Pertinent labs & imaging results that were available during my care of the patient were reviewed by me and considered in  my medical decision making (see chart for details).    BP 126/75 (BP Location: Left Arm)   Pulse 80   Temp 98.5 F (36.9 C) (Oral)   Resp 17   LMP 10/10/1969   SpO2 99%    Final Clinical Impressions(s) / ED Diagnoses   Final diagnoses:  None    New Prescriptions New Prescriptions   No medications on file   9:04 AM Pt here for packing removal on her L nares from recent nosebleed.  I have removed the packing and no additional bleeding noted.  She is stable for discharge. Care discussed with DR. Tegeler.    Domenic Moras, PA-C 01/10/17 2951    Tegeler, Gwenyth Allegra, MD 01/10/17 [phone]

## 2017-01-13 DIAGNOSIS — M6281 Muscle weakness (generalized): Secondary | ICD-10-CM | POA: Diagnosis not present

## 2017-01-13 DIAGNOSIS — K5901 Slow transit constipation: Secondary | ICD-10-CM | POA: Diagnosis not present

## 2017-01-13 DIAGNOSIS — R040 Epistaxis: Secondary | ICD-10-CM | POA: Diagnosis not present

## 2017-01-13 DIAGNOSIS — Z79899 Other long term (current) drug therapy: Secondary | ICD-10-CM | POA: Diagnosis not present

## 2017-01-14 ENCOUNTER — Inpatient Hospital Stay: Payer: Medicare Other | Admitting: Internal Medicine

## 2017-01-14 DIAGNOSIS — I480 Paroxysmal atrial fibrillation: Secondary | ICD-10-CM | POA: Diagnosis not present

## 2017-01-14 DIAGNOSIS — I130 Hypertensive heart and chronic kidney disease with heart failure and stage 1 through stage 4 chronic kidney disease, or unspecified chronic kidney disease: Secondary | ICD-10-CM | POA: Diagnosis not present

## 2017-01-14 DIAGNOSIS — D631 Anemia in chronic kidney disease: Secondary | ICD-10-CM | POA: Diagnosis not present

## 2017-01-14 DIAGNOSIS — I5022 Chronic systolic (congestive) heart failure: Secondary | ICD-10-CM | POA: Diagnosis not present

## 2017-01-14 DIAGNOSIS — N183 Chronic kidney disease, stage 3 (moderate): Secondary | ICD-10-CM | POA: Diagnosis not present

## 2017-01-15 DIAGNOSIS — Z7901 Long term (current) use of anticoagulants: Secondary | ICD-10-CM | POA: Diagnosis not present

## 2017-01-15 DIAGNOSIS — I4891 Unspecified atrial fibrillation: Secondary | ICD-10-CM | POA: Diagnosis not present

## 2017-01-17 ENCOUNTER — Emergency Department (HOSPITAL_COMMUNITY)
Admission: EM | Admit: 2017-01-17 | Discharge: 2017-01-17 | Disposition: A | Discharge status: Home / Self Care | Payer: Medicare Other | Attending: Emergency Medicine | Admitting: Emergency Medicine

## 2017-01-17 ENCOUNTER — Encounter (HOSPITAL_COMMUNITY): Payer: Self-pay | Admitting: Nurse Practitioner

## 2017-01-17 DIAGNOSIS — I130 Hypertensive heart and chronic kidney disease with heart failure and stage 1 through stage 4 chronic kidney disease, or unspecified chronic kidney disease: Secondary | ICD-10-CM | POA: Diagnosis not present

## 2017-01-17 DIAGNOSIS — Z853 Personal history of malignant neoplasm of breast: Secondary | ICD-10-CM | POA: Insufficient documentation

## 2017-01-17 DIAGNOSIS — R58 Hemorrhage, not elsewhere classified: Secondary | ICD-10-CM | POA: Diagnosis not present

## 2017-01-17 DIAGNOSIS — Z7901 Long term (current) use of anticoagulants: Secondary | ICD-10-CM | POA: Insufficient documentation

## 2017-01-17 DIAGNOSIS — N183 Chronic kidney disease, stage 3 (moderate): Secondary | ICD-10-CM | POA: Diagnosis not present

## 2017-01-17 DIAGNOSIS — R040 Epistaxis: Secondary | ICD-10-CM | POA: Insufficient documentation

## 2017-01-17 DIAGNOSIS — I5032 Chronic diastolic (congestive) heart failure: Secondary | ICD-10-CM | POA: Diagnosis not present

## 2017-01-17 DIAGNOSIS — Z79899 Other long term (current) drug therapy: Secondary | ICD-10-CM | POA: Insufficient documentation

## 2017-01-17 DIAGNOSIS — F0390 Unspecified dementia without behavioral disturbance: Secondary | ICD-10-CM | POA: Diagnosis not present

## 2017-01-17 LAB — CBC
HCT: 37.3 % (ref 36.0–46.0)
Hemoglobin: 11.5 g/dL — ABNORMAL LOW (ref 12.0–15.0)
MCH: 27.6 pg (ref 26.0–34.0)
MCHC: 30.8 g/dL (ref 30.0–36.0)
MCV: 89.4 fL (ref 78.0–100.0)
Platelets: 176 10*3/uL (ref 150–400)
RBC: 4.17 MIL/uL (ref 3.87–5.11)
RDW: 15.1 % (ref 11.5–15.5)
WBC: 7.6 10*3/uL (ref 4.0–10.5)

## 2017-01-17 LAB — PROTIME-INR
INR: 2.26
Prothrombin Time: 24.8 seconds — ABNORMAL HIGH (ref 11.4–15.2)

## 2017-01-17 MED ORDER — OXYMETAZOLINE HCL 0.05 % NA SOLN: 1.0000 | Freq: Once | NASAL | Status: DC

## 2017-01-17 NOTE — ED Notes (Signed)
Pt reports a bloody nose that started 45 minutes prior to her arrival. Pt currently in no acute distress and hemorrhage is controlled.

## 2017-01-17 NOTE — Discharge Instructions (Signed)
As discussed, your evaluation today has been largely reassuring.  But, it is important that you monitor your condition carefully, and do not hesitate to return to the ED if you develop new, or concerning changes in your condition. ? ?Otherwise, please follow-up with your physician for appropriate ongoing care. ? ?

## 2017-01-17 NOTE — ED Provider Notes (Signed)
[facility] EMERGENCY DEPARTMENT Provider Note   CSN: [id number] Arrival date & time: 01/17/17  1112     History   Chief Complaint Chief Complaint  Patient presents with  . Epistaxis    HPI Tracy Rodriguez is a 83 y.o. female.  HPI  Patient presents due to concern of epistaxis. Patient is on Coumadin due to history of atrial fibrillation. 1 week ago the patient had packing removed after a similar event that occurred several days prior. She notes that in the interval she was generally well. About 4 hours prior to ED arrival patient began to have bleeding from her left nostril. Symptoms resolved in the waiting room. She denies any ongoing lightheadedness, chest pain, nausea, vomiting. She is here with family members who assist with the HPI. He will appreciated  Past Medical History:  Diagnosis Date  . Arthritis    "joints" (08/04/2014)  . Bipolar 1 disorder ([place])   . Cancer of right breast ([place])   . CHF (congestive heart failure) ([place])   . Chronic lower back pain   . Hyperlipemia   . Hypertension   . PAF (paroxysmal atrial fibrillation) [facility])     Patient Active Problem List   Diagnosis Date Noted  . Functional dyspnea (2/2 posture/positioning w/ abnormal sprirometry) 10/03/2016  . Bipolar 1 disorder ([place]) 10/03/2016  . PAF (paroxysmal atrial fibrillation) ([place]) 10/03/2016  . Hypertension 10/03/2016  . Orthostatic syncope 10/03/2016  . Diarrhea 10/03/2016  . Chronic diastolic heart failure ([place]) 10/03/2016  . Supratherapeutic INR 10/03/2016  . Dementia 10/03/2016  . Hyperlipemia 10/03/2016  . CKD (chronic kidney disease) stage 3, GFR 30-59 ml/min (HCC) 10/03/2016  . Symptomatic bradycardia   . Dyspnea 09/16/2016  . CHF (congestive heart failure) ([place]) 03/04/2016  . Acute renal failure superimposed on stage 3 chronic kidney disease ([place]) 02/08/2016  . Fall   . Syncope 12/05/2015  . Physical deconditioning 12/05/2015  . Encounter for  therapeutic drug monitoring 12/22/2014  . Atrial fibrillation with RVR ([place]) 08/04/2014  . Paroxysmal atrial fibrillation ([place]) 08/04/2014  . Hypertensive urgency   . Obesity 09/27/2013  . Chronic back pain 09/14/2012  . Bipolar disorder ([place]) 09/14/2012    Past Surgical History:  Procedure Laterality Date  . AUGMENTATION MAMMAPLASTY Bilateral   . BACK SURGERY    . BREAST BIOPSY Right   . BREAST RECONSTRUCTION Bilateral   . CATARACT EXTRACTION W/ INTRAOCULAR LENS  IMPLANT, BILATERAL Bilateral   . EXCISIONAL HEMORRHOIDECTOMY    . [place]; 1973; 1985   "ruptured discs each time"  . MASTECTOMY Right    cancer  . PLACEMENT OF BREAST IMPLANTS Bilateral    "had to take tissue out of left"  . TONSILLECTOMY    . VAGINAL HYSTERECTOMY      OB History    No data available       Home Medications    Prior to Admission medications   Medication Sig Start Date End Date Taking? Authorizing Provider  acetaminophen (TYLENOL) 500 MG tablet Take 500 mg by mouth every 4 (four) hours as needed for mild pain or headache.    [provider]  albuterol (PROVENTIL) (2.5 MG/3ML) 0.083% nebulizer solution Take 3 mLs (2.5 mg total) by nebulization every 6 (six) hours as needed for wheezing or shortness of breath. 09/16/16   Magdalen Spatz, NP  alum & mag hydroxide-simeth (MAALOX/MYLANTA) 200-200-20 MG/5ML suspension Take 30 mLs by mouth as needed for indigestion.     [provider]  atorvastatin (LIPITOR) 20 MG tablet Take 2 tablets (40 mg total) by mouth daily. 02/12/16   Bonnielee Haff, MD  carvedilol (COREG) 3.125 MG tablet Take 1 tablet (3.125 mg total) by mouth 2 (two) times daily with a meal. PLEASE START THE LOWER DOSE ON Monday, 7/30 DEPENDING ON HER HEART RATE. OK TO START IF HEART RATE IS NOT LESS THAN 65. 10/07/16   Bonnielee Haff, MD  Cranberry-Vitamin C-Probiotic (AZO CRANBERRY) 250-30 MG TABS Take 1 tablet by mouth daily.    [provider]    digoxin (LANOXIN) 0.125 MG tablet Take 0.0625 mg by mouth daily. Hold id her pulse is less than 60    [provider]  escitalopram (LEXAPRO) 10 MG tablet TAKE 1 TABLET(10 MG) BY MOUTH DAILY Patient taking differently: TAKE 1 TABLET(20 MG) BY MOUTH DAILY 02/08/16   Marletta Lor, MD  gabapentin (NEURONTIN) 100 MG capsule Take 100 mg by mouth at bedtime.    [provider]  guaifenesin (ROBITUSSIN) 100 MG/5ML syrup Take 200 mg by mouth every 6 (six) hours as needed for cough.    [provider]  magnesium hydroxide (MILK OF MAGNESIA) 400 MG/5ML suspension Take 30 mLs by mouth daily as needed for mild constipation.    [provider]  Melatonin 3 MG TABS Take 3 mg by mouth at bedtime.    [provider]  Neomycin-Bacitracin-Polymyxin ([employer]S TRIPLE ANTIBIOTIC) [phone] OINT Apply 1 application topically as needed. For skin tear. Abrasions* Clean area with normal saline, apply TAO, cover with bandaid or Gauze and tape. Change as needed until healed.    [provider]  polyethylene glycol powder (GLYCOLAX/MIRALAX) powder Take 17 g by mouth daily as needed for moderate constipation. 10/05/16   Bonnielee Haff, MD  Psyllium (METAMUCIL) WAFR Take 2 Wafers by mouth daily. Prn constipation 03/25/16   Briscoe Deutscher, DO  QUEtiapine (SEROQUEL) 25 MG tablet Take 1 tablet (25 mg total) by mouth at bedtime. 10/05/16   Bonnielee Haff, MD  sennosides-docusate sodium (SENOKOT-S) 8.6-50 MG tablet Take 1 tablet by mouth daily. 05/28/16   Marletta Lor, MD  traMADol (ULTRAM) 50 MG tablet Take 1 tablet (50 mg total) by mouth See admin instructions. Take 50 mg by mouth three times a day at 8:00 am, 14:00 and 22:00 Also take 50 mg by mouth every four hours as needed for sever pain not to exceed total 4/day 10/05/16   Bonnielee Haff, MD  traZODone (DESYREL) 150 MG tablet Take 1 tablet (150 mg total) by mouth at bedtime. 10/05/16   Bonnielee Haff, MD   warfarin (COUMADIN) 1 MG tablet Take 1-4 tablets (1-4 mg total) by mouth daily. Take 2mg  daily starting 7/28. Check PT/INR on 7/30 and then dose accordingly. 10/05/16   Bonnielee Haff, MD    Family History Family History  Problem Relation Age of Onset  . Stroke Mother   . Heart disease Father   . Alzheimer's disease Sister   . Leukemia Brother   . Arthritis Maternal Grandmother   . Breast cancer Daughter     Social History Social History   Tobacco Use  . Smoking status: Never Smoker  . Smokeless tobacco: Never Used  Substance Use Topics  . Alcohol use: No  . Drug use: No     Allergies   Codeine; Morphine and related; Valium [diazepam]; Sulfa antibiotics; Darvon [propoxyphene]; Floxin [ofloxacin]; Levsin [hyoscyamine sulfate]; Penicillins; and Zantac [ranitidine hcl]   Review of Systems Review of Systems  Constitutional:  Per HPI, otherwise negative  HENT:       Per HPI, otherwise negative  Respiratory:       Per HPI, otherwise negative  Cardiovascular:       Per HPI, otherwise negative  Gastrointestinal: Negative for vomiting.  Endocrine:       Negative aside from HPI  Genitourinary:       Neg aside from HPI   Musculoskeletal:       Per HPI, otherwise negative  Skin: Negative.   Neurological: Negative for syncope.     Physical Exam Updated Vital Signs BP 110/70   Pulse 61   Temp 98.4 F (36.9 C) (Oral)   Resp 20   Ht 5\' 6"  (1.676 m)   Wt 73.9 kg (163 lb)   LMP 10/10/1969   SpO2 99%   BMI 26.31 kg/m   Physical Exam  Constitutional: She is oriented to person, place, and time. She appears well-developed and well-nourished. No distress.  HENT:  Head: Normocephalic and atraumatic.  Dried blood in left nostril, no active bleeding.  Eyes: Conjunctivae and EOM are normal.  Cardiovascular: Normal rate and regular rhythm.  Pulmonary/Chest: Effort normal and breath sounds normal. No stridor. No respiratory distress.  Abdominal: She exhibits no  distension.  Musculoskeletal: She exhibits no edema.  Neurological: She is alert and oriented to person, place, and time. No cranial nerve deficit.  Skin: Skin is warm and dry.  Psychiatric: She has a normal mood and affect.  Nursing note and vitals reviewed.    ED Treatments / Results  Labs (all labs ordered are listed, but only abnormal results are displayed) Labs Reviewed  CBC - Abnormal; Notable for the following components:      Result Value   Hemoglobin 11.5 (*)    All other components within normal limits  PROTIME-INR     Procedures Procedures (including critical care time)  Medications Ordered in ED Medications  oxymetazoline (AFRIN) 0.05 % nasal spray 1 spray (0 sprays Each Nare Hold 01/17/17 1150)     Initial Impression / Assessment and Plan / ED Course  I have reviewed the triage vital signs and the nursing notes.  Pertinent labs & imaging results that were available during my care of the patient were reviewed by me and considered in my medical decision making (see chart for details).  This elderly female presents with concern of epistaxis. Patient is awake, alert, has no ongoing bleeding in the emergency department.  Patient is on Coumadin, and INR today is within normal limits. With no active bleeding the patient and I discussed methods to minimize likelihood of recurrence as well as ongoing therapy at home including Afrin, pressure. Patient will follow up with ENT.  On absent of the complaints, no hematemesis instability, the patient is appropriate for discharge with outpatient follow-up.  Final Clinical Impressions(s) / ED Diagnoses  epistaxis   Carmin Muskrat, MD 01/17/17 1743

## 2017-01-17 NOTE — ED Triage Notes (Signed)
Per EMS pt from home for epistaxis. Pt was seen for same last week and had nosed packed and rx afrin. Pt takes warfarin. EMS administered afrin in left nostril with resolution of bleeding.   Last set of vitals 130/82 HR 60 RR18 CBG 89

## 2017-01-20 DIAGNOSIS — M6281 Muscle weakness (generalized): Secondary | ICD-10-CM | POA: Diagnosis not present

## 2017-01-20 DIAGNOSIS — Z79899 Other long term (current) drug therapy: Secondary | ICD-10-CM | POA: Diagnosis not present

## 2017-01-20 DIAGNOSIS — I5022 Chronic systolic (congestive) heart failure: Secondary | ICD-10-CM | POA: Diagnosis not present

## 2017-01-20 DIAGNOSIS — I130 Hypertensive heart and chronic kidney disease with heart failure and stage 1 through stage 4 chronic kidney disease, or unspecified chronic kidney disease: Secondary | ICD-10-CM | POA: Diagnosis not present

## 2017-01-20 DIAGNOSIS — R040 Epistaxis: Secondary | ICD-10-CM | POA: Diagnosis not present

## 2017-01-21 DIAGNOSIS — I5022 Chronic systolic (congestive) heart failure: Secondary | ICD-10-CM | POA: Diagnosis not present

## 2017-01-21 DIAGNOSIS — I130 Hypertensive heart and chronic kidney disease with heart failure and stage 1 through stage 4 chronic kidney disease, or unspecified chronic kidney disease: Secondary | ICD-10-CM | POA: Diagnosis not present

## 2017-01-22 DIAGNOSIS — I4891 Unspecified atrial fibrillation: Secondary | ICD-10-CM | POA: Diagnosis not present

## 2017-01-22 DIAGNOSIS — N390 Urinary tract infection, site not specified: Secondary | ICD-10-CM | POA: Diagnosis not present

## 2017-01-22 DIAGNOSIS — Z7901 Long term (current) use of anticoagulants: Secondary | ICD-10-CM | POA: Diagnosis not present

## 2017-01-22 DIAGNOSIS — I480 Paroxysmal atrial fibrillation: Secondary | ICD-10-CM | POA: Diagnosis not present

## 2017-01-24 DIAGNOSIS — I5022 Chronic systolic (congestive) heart failure: Secondary | ICD-10-CM | POA: Diagnosis not present

## 2017-01-24 DIAGNOSIS — I130 Hypertensive heart and chronic kidney disease with heart failure and stage 1 through stage 4 chronic kidney disease, or unspecified chronic kidney disease: Secondary | ICD-10-CM | POA: Diagnosis not present

## 2017-01-27 DIAGNOSIS — I5022 Chronic systolic (congestive) heart failure: Secondary | ICD-10-CM | POA: Diagnosis not present

## 2017-01-27 DIAGNOSIS — I130 Hypertensive heart and chronic kidney disease with heart failure and stage 1 through stage 4 chronic kidney disease, or unspecified chronic kidney disease: Secondary | ICD-10-CM | POA: Diagnosis not present

## 2017-01-29 DIAGNOSIS — I130 Hypertensive heart and chronic kidney disease with heart failure and stage 1 through stage 4 chronic kidney disease, or unspecified chronic kidney disease: Secondary | ICD-10-CM | POA: Diagnosis not present

## 2017-01-29 DIAGNOSIS — I5022 Chronic systolic (congestive) heart failure: Secondary | ICD-10-CM | POA: Diagnosis not present

## 2017-01-31 DIAGNOSIS — I5022 Chronic systolic (congestive) heart failure: Secondary | ICD-10-CM | POA: Diagnosis not present

## 2017-01-31 DIAGNOSIS — I130 Hypertensive heart and chronic kidney disease with heart failure and stage 1 through stage 4 chronic kidney disease, or unspecified chronic kidney disease: Secondary | ICD-10-CM | POA: Diagnosis not present

## 2017-02-03 DIAGNOSIS — I5022 Chronic systolic (congestive) heart failure: Secondary | ICD-10-CM | POA: Diagnosis not present

## 2017-02-03 DIAGNOSIS — I130 Hypertensive heart and chronic kidney disease with heart failure and stage 1 through stage 4 chronic kidney disease, or unspecified chronic kidney disease: Secondary | ICD-10-CM | POA: Diagnosis not present

## 2017-02-04 DIAGNOSIS — I130 Hypertensive heart and chronic kidney disease with heart failure and stage 1 through stage 4 chronic kidney disease, or unspecified chronic kidney disease: Secondary | ICD-10-CM | POA: Diagnosis not present

## 2017-02-04 DIAGNOSIS — I5022 Chronic systolic (congestive) heart failure: Secondary | ICD-10-CM | POA: Diagnosis not present

## 2017-02-07 DIAGNOSIS — I5022 Chronic systolic (congestive) heart failure: Secondary | ICD-10-CM | POA: Diagnosis not present

## 2017-02-07 DIAGNOSIS — I130 Hypertensive heart and chronic kidney disease with heart failure and stage 1 through stage 4 chronic kidney disease, or unspecified chronic kidney disease: Secondary | ICD-10-CM | POA: Diagnosis not present

## 2017-02-13 DIAGNOSIS — I130 Hypertensive heart and chronic kidney disease with heart failure and stage 1 through stage 4 chronic kidney disease, or unspecified chronic kidney disease: Secondary | ICD-10-CM | POA: Diagnosis not present

## 2017-02-13 DIAGNOSIS — I5022 Chronic systolic (congestive) heart failure: Secondary | ICD-10-CM | POA: Diagnosis not present

## 2017-04-14 DIAGNOSIS — I480 Paroxysmal atrial fibrillation: Secondary | ICD-10-CM | POA: Diagnosis not present

## 2017-04-14 DIAGNOSIS — R296 Repeated falls: Secondary | ICD-10-CM | POA: Diagnosis not present

## 2017-04-14 DIAGNOSIS — I10 Essential (primary) hypertension: Secondary | ICD-10-CM | POA: Diagnosis not present

## 2017-04-14 DIAGNOSIS — R42 Dizziness and giddiness: Secondary | ICD-10-CM | POA: Diagnosis not present

## 2017-04-16 DIAGNOSIS — Z79899 Other long term (current) drug therapy: Secondary | ICD-10-CM | POA: Diagnosis not present

## 2017-04-16 DIAGNOSIS — N390 Urinary tract infection, site not specified: Secondary | ICD-10-CM | POA: Diagnosis not present

## 2017-04-23 DIAGNOSIS — D649 Anemia, unspecified: Secondary | ICD-10-CM | POA: Diagnosis not present

## 2017-04-23 DIAGNOSIS — I4891 Unspecified atrial fibrillation: Secondary | ICD-10-CM | POA: Diagnosis not present

## 2017-04-23 DIAGNOSIS — I10 Essential (primary) hypertension: Secondary | ICD-10-CM | POA: Diagnosis not present

## 2017-04-28 DIAGNOSIS — I10 Essential (primary) hypertension: Secondary | ICD-10-CM | POA: Diagnosis not present

## 2017-04-28 DIAGNOSIS — R296 Repeated falls: Secondary | ICD-10-CM | POA: Diagnosis not present

## 2017-04-30 DIAGNOSIS — D631 Anemia in chronic kidney disease: Secondary | ICD-10-CM | POA: Diagnosis not present

## 2017-04-30 DIAGNOSIS — I480 Paroxysmal atrial fibrillation: Secondary | ICD-10-CM | POA: Diagnosis not present

## 2017-04-30 DIAGNOSIS — I130 Hypertensive heart and chronic kidney disease with heart failure and stage 1 through stage 4 chronic kidney disease, or unspecified chronic kidney disease: Secondary | ICD-10-CM | POA: Diagnosis not present

## 2017-04-30 DIAGNOSIS — M549 Dorsalgia, unspecified: Secondary | ICD-10-CM | POA: Diagnosis not present

## 2017-04-30 DIAGNOSIS — N183 Chronic kidney disease, stage 3 (moderate): Secondary | ICD-10-CM | POA: Diagnosis not present

## 2017-04-30 DIAGNOSIS — I5033 Acute on chronic diastolic (congestive) heart failure: Secondary | ICD-10-CM | POA: Diagnosis not present

## 2017-05-05 DIAGNOSIS — M549 Dorsalgia, unspecified: Secondary | ICD-10-CM | POA: Diagnosis not present

## 2017-05-05 DIAGNOSIS — M79675 Pain in left toe(s): Secondary | ICD-10-CM | POA: Diagnosis not present

## 2017-05-05 DIAGNOSIS — B351 Tinea unguium: Secondary | ICD-10-CM | POA: Diagnosis not present

## 2017-05-05 DIAGNOSIS — M79674 Pain in right toe(s): Secondary | ICD-10-CM | POA: Diagnosis not present

## 2017-05-05 DIAGNOSIS — I130 Hypertensive heart and chronic kidney disease with heart failure and stage 1 through stage 4 chronic kidney disease, or unspecified chronic kidney disease: Secondary | ICD-10-CM | POA: Diagnosis not present

## 2017-05-08 DIAGNOSIS — I130 Hypertensive heart and chronic kidney disease with heart failure and stage 1 through stage 4 chronic kidney disease, or unspecified chronic kidney disease: Secondary | ICD-10-CM | POA: Diagnosis not present

## 2017-05-08 DIAGNOSIS — M549 Dorsalgia, unspecified: Secondary | ICD-10-CM | POA: Diagnosis not present

## 2017-05-13 DIAGNOSIS — M549 Dorsalgia, unspecified: Secondary | ICD-10-CM | POA: Diagnosis not present

## 2017-05-13 DIAGNOSIS — I130 Hypertensive heart and chronic kidney disease with heart failure and stage 1 through stage 4 chronic kidney disease, or unspecified chronic kidney disease: Secondary | ICD-10-CM | POA: Diagnosis not present

## 2017-05-15 DIAGNOSIS — M549 Dorsalgia, unspecified: Secondary | ICD-10-CM | POA: Diagnosis not present

## 2017-05-15 DIAGNOSIS — I130 Hypertensive heart and chronic kidney disease with heart failure and stage 1 through stage 4 chronic kidney disease, or unspecified chronic kidney disease: Secondary | ICD-10-CM | POA: Diagnosis not present

## 2017-05-19 DIAGNOSIS — M549 Dorsalgia, unspecified: Secondary | ICD-10-CM | POA: Diagnosis not present

## 2017-05-19 DIAGNOSIS — I130 Hypertensive heart and chronic kidney disease with heart failure and stage 1 through stage 4 chronic kidney disease, or unspecified chronic kidney disease: Secondary | ICD-10-CM | POA: Diagnosis not present

## 2017-05-20 DIAGNOSIS — I130 Hypertensive heart and chronic kidney disease with heart failure and stage 1 through stage 4 chronic kidney disease, or unspecified chronic kidney disease: Secondary | ICD-10-CM | POA: Diagnosis not present

## 2017-05-20 DIAGNOSIS — M549 Dorsalgia, unspecified: Secondary | ICD-10-CM | POA: Diagnosis not present

## 2017-05-22 DIAGNOSIS — M549 Dorsalgia, unspecified: Secondary | ICD-10-CM | POA: Diagnosis not present

## 2017-05-22 DIAGNOSIS — I130 Hypertensive heart and chronic kidney disease with heart failure and stage 1 through stage 4 chronic kidney disease, or unspecified chronic kidney disease: Secondary | ICD-10-CM | POA: Diagnosis not present

## 2017-05-27 DIAGNOSIS — M549 Dorsalgia, unspecified: Secondary | ICD-10-CM | POA: Diagnosis not present

## 2017-05-27 DIAGNOSIS — I130 Hypertensive heart and chronic kidney disease with heart failure and stage 1 through stage 4 chronic kidney disease, or unspecified chronic kidney disease: Secondary | ICD-10-CM | POA: Diagnosis not present

## 2017-05-29 DIAGNOSIS — I130 Hypertensive heart and chronic kidney disease with heart failure and stage 1 through stage 4 chronic kidney disease, or unspecified chronic kidney disease: Secondary | ICD-10-CM | POA: Diagnosis not present

## 2017-05-29 DIAGNOSIS — M549 Dorsalgia, unspecified: Secondary | ICD-10-CM | POA: Diagnosis not present

## 2017-06-02 DIAGNOSIS — M549 Dorsalgia, unspecified: Secondary | ICD-10-CM | POA: Diagnosis not present

## 2017-06-02 DIAGNOSIS — I130 Hypertensive heart and chronic kidney disease with heart failure and stage 1 through stage 4 chronic kidney disease, or unspecified chronic kidney disease: Secondary | ICD-10-CM | POA: Diagnosis not present

## 2017-06-05 DIAGNOSIS — I130 Hypertensive heart and chronic kidney disease with heart failure and stage 1 through stage 4 chronic kidney disease, or unspecified chronic kidney disease: Secondary | ICD-10-CM | POA: Diagnosis not present

## 2017-06-05 DIAGNOSIS — M549 Dorsalgia, unspecified: Secondary | ICD-10-CM | POA: Diagnosis not present

## 2017-07-07 DIAGNOSIS — B351 Tinea unguium: Secondary | ICD-10-CM | POA: Diagnosis not present

## 2017-07-07 DIAGNOSIS — M79674 Pain in right toe(s): Secondary | ICD-10-CM | POA: Diagnosis not present

## 2017-07-07 DIAGNOSIS — M79675 Pain in left toe(s): Secondary | ICD-10-CM | POA: Diagnosis not present

## 2017-07-16 DIAGNOSIS — Z79899 Other long term (current) drug therapy: Secondary | ICD-10-CM | POA: Diagnosis not present

## 2017-07-16 DIAGNOSIS — I4891 Unspecified atrial fibrillation: Secondary | ICD-10-CM | POA: Diagnosis not present

## 2017-07-16 DIAGNOSIS — I10 Essential (primary) hypertension: Secondary | ICD-10-CM | POA: Diagnosis not present

## 2017-09-01 DIAGNOSIS — L239 Allergic contact dermatitis, unspecified cause: Secondary | ICD-10-CM | POA: Diagnosis not present

## 2017-09-01 DIAGNOSIS — L299 Pruritus, unspecified: Secondary | ICD-10-CM | POA: Diagnosis not present

## 2017-09-29 ENCOUNTER — Other Ambulatory Visit: Payer: Self-pay

## 2017-09-29 MED ORDER — ESCITALOPRAM OXALATE 10 MG PO TABS: ORAL_TABLET | ORAL | 1 refills | Status: DC

## 2017-09-29 NOTE — Telephone Encounter (Signed)
Last fill 02/08/16 Last OV 09/18/16 Ok to fill?

## 2017-10-06 DIAGNOSIS — Z79899 Other long term (current) drug therapy: Secondary | ICD-10-CM | POA: Diagnosis not present

## 2017-10-06 DIAGNOSIS — I10 Essential (primary) hypertension: Secondary | ICD-10-CM | POA: Diagnosis not present

## 2017-10-08 DIAGNOSIS — D649 Anemia, unspecified: Secondary | ICD-10-CM | POA: Diagnosis not present

## 2017-10-08 DIAGNOSIS — I10 Essential (primary) hypertension: Secondary | ICD-10-CM | POA: Diagnosis not present

## 2017-10-08 DIAGNOSIS — I480 Paroxysmal atrial fibrillation: Secondary | ICD-10-CM | POA: Diagnosis not present

## 2017-10-08 DIAGNOSIS — I4891 Unspecified atrial fibrillation: Secondary | ICD-10-CM | POA: Diagnosis not present

## 2017-10-17 ENCOUNTER — Other Ambulatory Visit: Payer: Self-pay | Admitting: *Deleted

## 2017-10-17 NOTE — Telephone Encounter (Signed)
Fax request received for Seroquel from [facility].  Last filled and last office visit >1 year ago. No future appts scheduled. Called patient to schedule and spoke w/ her daughter, Eustaquio Maize (on [place]). Daughter states patient is at [facility] long term and the provider their fills all her meds through [facility]. No further refills are needed from our office. Dr. Burnice Logan removed as PCP.

## 2017-10-20 ENCOUNTER — Other Ambulatory Visit: Payer: Self-pay

## 2017-11-26 DIAGNOSIS — Z23 Encounter for immunization: Secondary | ICD-10-CM | POA: Diagnosis not present

## 2017-12-08 ENCOUNTER — Encounter (HOSPITAL_COMMUNITY): Payer: Self-pay | Admitting: Emergency Medicine

## 2017-12-08 ENCOUNTER — Emergency Department (HOSPITAL_COMMUNITY)
Admission: EM | Admit: 2017-12-08 | Discharge: 2017-12-09 | Disposition: A | Discharge status: Home / Self Care | Payer: Medicare Other | Attending: Emergency Medicine | Admitting: Emergency Medicine

## 2017-12-08 ENCOUNTER — Other Ambulatory Visit: Payer: Self-pay

## 2017-12-08 DIAGNOSIS — R040 Epistaxis: Secondary | ICD-10-CM | POA: Insufficient documentation

## 2017-12-08 DIAGNOSIS — N183 Chronic kidney disease, stage 3 (moderate): Secondary | ICD-10-CM | POA: Insufficient documentation

## 2017-12-08 DIAGNOSIS — Z79899 Other long term (current) drug therapy: Secondary | ICD-10-CM | POA: Diagnosis not present

## 2017-12-08 DIAGNOSIS — Z7901 Long term (current) use of anticoagulants: Secondary | ICD-10-CM | POA: Diagnosis not present

## 2017-12-08 DIAGNOSIS — I10 Essential (primary) hypertension: Secondary | ICD-10-CM | POA: Diagnosis not present

## 2017-12-08 DIAGNOSIS — I5032 Chronic diastolic (congestive) heart failure: Secondary | ICD-10-CM | POA: Insufficient documentation

## 2017-12-08 DIAGNOSIS — I130 Hypertensive heart and chronic kidney disease with heart failure and stage 1 through stage 4 chronic kidney disease, or unspecified chronic kidney disease: Secondary | ICD-10-CM | POA: Insufficient documentation

## 2017-12-08 DIAGNOSIS — R58 Hemorrhage, not elsewhere classified: Secondary | ICD-10-CM | POA: Diagnosis not present

## 2017-12-08 DIAGNOSIS — I480 Paroxysmal atrial fibrillation: Secondary | ICD-10-CM | POA: Insufficient documentation

## 2017-12-08 DIAGNOSIS — R110 Nausea: Secondary | ICD-10-CM | POA: Diagnosis not present

## 2017-12-08 MED ORDER — PHENYLEPHRINE HCL 0.5 % NA SOLN
1.0000 [drp] | Freq: Once | NASAL | Status: AC
  Administered 2017-12-08: 1 [drp] via NASAL
  Filled 2017-12-08: qty 15

## 2017-12-08 NOTE — ED Provider Notes (Signed)
[facility] EMERGENCY DEPARTMENT Provider Note   CSN: [id number] Arrival date & time: 12/08/17  2302     History   Chief Complaint Chief Complaint  Patient presents with  . Epistaxis    HPI Tracy Rodriguez is a 84 y.o. female.  HPI  This is an 84 year old female with a history of atrial fibrillation, heart failure, hypertension, hyperlipidemia who presents with epistaxis.  Patient reports that she has been bleeding mostly out of her left naris since noon yesterday.  She is on Xarelto.  She states that she continued to bleed and finally called EMS.  She has had a history of a similar nosebleed in the past.  No intervention was needed.  She denies any dizziness or syncope.  Denies any recent illnesses including upper respiratory congestion.  Denies trauma.  Past Medical History:  Diagnosis Date  . Arthritis    "joints" (08/04/2014)  . Bipolar 1 disorder ([place])   . Cancer of right breast ([place])   . CHF (congestive heart failure) ([place])   . Chronic lower back pain   . Hyperlipemia   . Hypertension   . PAF (paroxysmal atrial fibrillation) [facility])     Patient Active Problem List   Diagnosis Date Noted  . Functional dyspnea (2/2 posture/positioning w/ abnormal sprirometry) 10/03/2016  . Bipolar 1 disorder ([place]) 10/03/2016  . PAF (paroxysmal atrial fibrillation) ([place]) 10/03/2016  . Hypertension 10/03/2016  . Orthostatic syncope 10/03/2016  . Diarrhea 10/03/2016  . Chronic diastolic heart failure ([place]) 10/03/2016  . Supratherapeutic INR 10/03/2016  . Dementia ([place]) 10/03/2016  . Hyperlipemia 10/03/2016  . CKD (chronic kidney disease) stage 3, GFR 30-59 ml/min (HCC) 10/03/2016  . Symptomatic bradycardia   . Dyspnea 09/16/2016  . CHF (congestive heart failure) ([place]) 03/04/2016  . Acute renal failure superimposed on stage 3 chronic kidney disease ([place]) 02/08/2016  . Fall   . Syncope 12/05/2015  . Physical deconditioning 12/05/2015  . Encounter for therapeutic  drug monitoring 12/22/2014  . Atrial fibrillation with RVR ([place]) 08/04/2014  . Paroxysmal atrial fibrillation ([place]) 08/04/2014  . Hypertensive urgency   . Obesity 09/27/2013  . Chronic back pain 09/14/2012  . Bipolar disorder ([place]) 09/14/2012    Past Surgical History:  Procedure Laterality Date  . AUGMENTATION MAMMAPLASTY Bilateral   . BACK SURGERY    . BREAST BIOPSY Right   . BREAST RECONSTRUCTION Bilateral   . CATARACT EXTRACTION W/ INTRAOCULAR LENS  IMPLANT, BILATERAL Bilateral   . EXCISIONAL HEMORRHOIDECTOMY    . [place]; 1973; 1985   "ruptured discs each time"  . MASTECTOMY Right    cancer  . PLACEMENT OF BREAST IMPLANTS Bilateral    "had to take tissue out of left"  . TONSILLECTOMY    . VAGINAL HYSTERECTOMY       OB History   None      Home Medications    Prior to Admission medications   Medication Sig Start Date End Date Taking? Authorizing Provider  acetaminophen (TYLENOL) 500 MG tablet Take 500 mg by mouth every 4 (four) hours as needed for mild pain or headache.    [provider]  albuterol (PROVENTIL) (2.5 MG/3ML) 0.083% nebulizer solution Take 3 mLs (2.5 mg total) by nebulization every 6 (six) hours as needed for wheezing or shortness of breath. Patient not taking: Reported on 01/17/2017 09/16/16   Magdalen Spatz, NP  alum & mag hydroxide-simeth (MAALOX/MYLANTA) 200-200-20 MG/5ML suspension Take 30 mLs by mouth as needed for indigestion.  [provider]  atorvastatin (LIPITOR) 20 MG tablet Take 2 tablets (40 mg total) by mouth daily. 02/12/16   Bonnielee Haff, MD  carvedilol (COREG) 3.125 MG tablet Take 1 tablet (3.125 mg total) by mouth 2 (two) times daily with a meal. PLEASE START THE LOWER DOSE ON Monday, 7/30 DEPENDING ON HER HEART RATE. OK TO START IF HEART RATE IS NOT LESS THAN 65. 10/07/16   Bonnielee Haff, MD  cephALEXin (KEFLEX) 500 MG capsule Take 1 capsule (500 mg total) by mouth 4 (four) times daily. 12/09/17    Samul Mcinroy, Barbette Hair, MD  Cranberry-Vitamin C-Probiotic (AZO CRANBERRY) 250-30 MG TABS Take 1 tablet by mouth daily.    [provider]  cromolyn (NASALCROM) 5.2 MG/ACT nasal spray Place 2 sprays as needed into both nostrils for allergies.    [provider]  digoxin (LANOXIN) 0.125 MG tablet Take 0.0625 mg by mouth daily. Hold id her pulse is less than 60    [provider]  diltiazem (CARDIZEM CD) 120 MG 24 hr capsule Take 120 mg daily by mouth.    [provider]  escitalopram (LEXAPRO) 10 MG tablet TAKE 1 TABLET(10 MG) BY MOUTH DAILY 09/29/17   Marletta Lor, MD  gabapentin (NEURONTIN) 100 MG capsule Take 100 mg by mouth at bedtime.    [provider]  guaifenesin (ROBITUSSIN) 100 MG/5ML syrup Take 200 mg by mouth every 6 (six) hours as needed for cough.    [provider]  loperamide (IMODIUM) 2 MG capsule Take 2 mg See admin instructions by mouth. Standing order: Take 1 capsule  By mouth each  Loose stool as needed for Diarrhea (Do not Exceed 8  Doses in 24hrs    [provider]  magnesium hydroxide (MILK OF MAGNESIA) 400 MG/5ML suspension Take 30 mLs by mouth daily as needed for mild constipation.    [provider]  Melatonin 3 MG TABS Take 3 mg by mouth at bedtime.    [provider]  Neomycin-Bacitracin-Polymyxin ([employer] TRIPLE ANTIBIOTIC) [phone] OINT Apply 1 application topically as needed. For skin tear. Abrasions* Clean area with normal saline, apply TAO, cover with bandaid or Gauze and tape. Change as needed until healed.    [provider]  polyethylene glycol powder (GLYCOLAX/MIRALAX) powder Take 17 g by mouth daily as needed for moderate constipation. 10/05/16   Bonnielee Haff, MD  Psyllium (METAMUCIL) WAFR Take 2 Wafers by mouth daily. Prn constipation 03/25/16   Briscoe Deutscher, DO  QUEtiapine (SEROQUEL) 25 MG tablet Take 1 tablet (25 mg total) by mouth at bedtime. 10/05/16   Bonnielee Haff, MD  sennosides-docusate sodium (SENOKOT-S) 8.6-50 MG tablet Take 1 tablet by mouth daily. 05/28/16   Marletta Lor, MD  traMADol (ULTRAM) 50 MG tablet Take 1 tablet (50 mg total) by mouth See admin instructions. Take 50 mg by mouth three times a day at 8:00 am, 14:00 and 22:00 Also take 50 mg by mouth every four hours as needed for sever pain not to exceed total 4/day 10/05/16   Bonnielee Haff, MD  traZODone (DESYREL) 150 MG tablet Take 1 tablet (150 mg total) by mouth at bedtime. 10/05/16   Bonnielee Haff, MD  warfarin (COUMADIN) 1 MG tablet Take 1-4 tablets (1-4 mg total) by mouth daily. Take 2mg  daily starting 7/28. Check PT/INR on 7/30 and then dose accordingly. Patient taking differently: Take 2-4 mg daily by mouth. Take 2mg  on Monday, Wednesday and Friday Take 4 mg on Sunday , Tuesday, Thursday  and Saturday 10/05/16   Bonnielee Haff, MD    Family History Family History  Problem Relation Age of Onset  . Stroke Mother   . Heart disease Father   . Alzheimer's disease Sister   . Leukemia Brother   . Arthritis Maternal Grandmother   . Breast cancer Daughter     Social History Social History   Tobacco Use  . Smoking status: Never Smoker  . Smokeless tobacco: Never Used  Substance Use Topics  . Alcohol use: No  . Drug use: No     Allergies   Codeine; Morphine and related; Valium [diazepam]; Sulfa antibiotics; Darvon [propoxyphene]; Floxin [ofloxacin]; Levsin [hyoscyamine sulfate]; Penicillins; and Zantac [ranitidine hcl]   Review of Systems Review of Systems  Constitutional: Negative for fever.  HENT: Positive for nosebleeds. Negative for congestion.   Respiratory: Negative for shortness of breath.   Cardiovascular: Negative for chest pain.  Neurological: Negative for dizziness.  All other systems reviewed and are negative.    Physical Exam Updated Vital Signs BP (!) 180/91   Pulse 81   Temp (!) 97.5 F (36.4 C) (Oral)   Resp 18   Ht 1.676 m (5\' 6" )    Wt 74.4 kg   LMP 10/10/1969   SpO2 93%   BMI 26.47 kg/m   Physical Exam  Constitutional: She is oriented to person, place, and time. She appears well-developed and well-nourished.  Elderly, nontoxic-appearing, no acute distress  HENT:  Head: Normocephalic and atraumatic.  No active bleeding at this time, large blood clot noted anterior left naris  Eyes: Pupils are equal, round, and reactive to light.  Cardiovascular: Normal rate and normal heart sounds.  Irregular rhythm  Pulmonary/Chest: Effort normal and breath sounds normal. No respiratory distress. She has no wheezes.  Abdominal: Soft. There is no tenderness.  Neurological: She is alert and oriented to person, place, and time.  Skin: Skin is warm and dry.  Psychiatric: She has a normal mood and affect.  Nursing note and vitals reviewed.    ED Treatments / Results  Labs (all labs ordered are listed, but only abnormal results are displayed) Labs Reviewed - No data to display  EKG None  Radiology No results found.  Procedures .Epistaxis Management Date/Time: 12/09/2017 4:43 AM Performed by: Merryl Hacker, MD Authorized by: Merryl Hacker, MD   Consent:    Consent obtained:  Verbal   Consent given by:  Patient   Risks discussed:  Bleeding, infection and nasal injury Anesthesia (see MAR for exact dosages):    Anesthesia method:  Topical application   Topical anesthetic:  Lidocaine gel Procedure details:    Treatment site:  L anterior   Treatment method:  Anterior pack (failed rapid rhino, subsequent placement of rhino rocket)   Treatment complexity:  Extensive   Treatment episode: recurring   Post-procedure details:    Assessment:  Bleeding stopped   Patient tolerance of procedure:  Tolerated well, no immediate complications   (including critical care time)  CRITICAL CARE Performed by: Merryl Hacker   Total critical care time:  45 minutes  Critical care time was exclusive of separately  billable procedures and treating other patients.  Critical care was necessary to treat or prevent imminent or life-threatening deterioration.  Critical care was time spent personally by me on the following activities: development of treatment plan with patient and/or surrogate as well as nursing, discussions with consultants, evaluation of patient's response to treatment, examination of patient, obtaining history from patient or  surrogate, ordering and performing treatments and interventions, ordering and review of laboratory studies, ordering and review of radiographic studies, pulse oximetry and re-evaluation of patient's condition.   Medications Ordered in ED Medications  traMADol (ULTRAM) tablet 50 mg (50 mg Oral Not Given 12/09/17 0140)  carvedilol (COREG) tablet 3.125 mg (has no administration in time range)  phenylephrine (NEO-SYNEPHRINE) 0.5 % nasal solution 1 drop (1 drop Each Nare Given 12/08/17 2355)  lidocaine (XYLOCAINE) 2 % jelly 1 application (1 application Topical Given 12/09/17 0122)  tranexamic acid (CYKLOKAPRON) injection 1,000 mg (1,000 mg Topical Given 12/09/17 0145)  traMADol (ULTRAM) 50 MG tablet (50 mg  Given 12/09/17 0150)  HYDROcodone-acetaminophen (NORCO/VICODIN) 5-325 MG per tablet 1 tablet (1 tablet Oral Given 12/09/17 0329)     Initial Impression / Assessment and Plan / ED Course  I have reviewed the triage vital signs and the nursing notes.  Pertinent labs & imaging results that were available during my care of the patient were reviewed by me and considered in my medical decision making (see chart for details).  Clinical Course as of Dec 09 441  Tue Dec 09, 2017  0403 Bleeding has stopped.  Patient is hypertensive but likely related to discomfort.  Will focus on making patient more comfortable.   [CH]    Clinical Course User Index [CH] Jacobb Alen, Barbette Hair, MD    Patient presents with epistaxis.  She is on anticoagulation for atrial fibrillation.  Clots were  evacuated from the left naris.  Initial conservative management with silver nitrate and Afrin failed.  Patient was packed initially with a rapid Rhino which did not stop the bleeding.  This was removed and a Rhino Rocket was placed.  This did temporarily stop bleeding.  She was notably hypertensive but endorsed significant pain.  She was given pain medication and her morning dose of blood pressure medication.  Instructed to follow-up with ENT regarding definitive treatment.  We placed on Keflex for antibiotics.  After history, exam, and medical workup I feel the patient has been appropriately medically screened and is safe for discharge home. Pertinent diagnoses were discussed with the patient. Patient was given return precautions.   Final Clinical Impressions(s) / ED Diagnoses   Final diagnoses:  Epistaxis    ED Discharge Orders         Ordered    cephALEXin (KEFLEX) 500 MG capsule  4 times daily     12/09/17 0437           Gedalia Mcmillon, Barbette Hair, MD 12/09/17 0502

## 2017-12-08 NOTE — ED Triage Notes (Signed)
Pt arrived [facility] from [facility] for reports of epistaxis. EMS reports that per facility nose bleed started today at approx 12pm and has been on going since, nasal spray administered at 5pm but no other interventions completed. Pt denies pain or SOB. Is on Xarelto last taken at 0830. EMS reports bleeding had stop on their arrival but began to ooze in route, pressure applied and bleeding controled on arrival. Vital with EMS BP156/102 P64 RR14 O2100% CBG

## 2017-12-09 DIAGNOSIS — R5381 Other malaise: Secondary | ICD-10-CM | POA: Diagnosis not present

## 2017-12-09 DIAGNOSIS — R040 Epistaxis: Secondary | ICD-10-CM | POA: Diagnosis not present

## 2017-12-09 DIAGNOSIS — I10 Essential (primary) hypertension: Secondary | ICD-10-CM | POA: Diagnosis not present

## 2017-12-09 DIAGNOSIS — Z7401 Bed confinement status: Secondary | ICD-10-CM | POA: Diagnosis not present

## 2017-12-09 DIAGNOSIS — M2550 Pain in unspecified joint: Secondary | ICD-10-CM | POA: Diagnosis not present

## 2017-12-09 MED ORDER — HYDROCODONE-ACETAMINOPHEN 5-325 MG PO TABS
1.0000 | ORAL_TABLET | Freq: Once | ORAL | Status: AC
  Administered 2017-12-09: 1 via ORAL
  Filled 2017-12-09: qty 1

## 2017-12-09 MED ORDER — CARVEDILOL 3.125 MG PO TABS
3.1250 mg | ORAL_TABLET | Freq: Two times a day (BID) | ORAL | Status: DC
  Filled 2017-12-09: qty 1

## 2017-12-09 MED ORDER — TRAMADOL HCL 50 MG PO TABS
ORAL_TABLET | ORAL | Status: AC
  Administered 2017-12-09: 50 mg
  Filled 2017-12-09: qty 1

## 2017-12-09 MED ORDER — TRAMADOL HCL 50 MG PO TABS: 50.0000 mg | ORAL_TABLET | Freq: Once | ORAL | Status: DC

## 2017-12-09 MED ORDER — CARVEDILOL 3.125 MG PO TABS
3.1250 mg | ORAL_TABLET | Freq: Two times a day (BID) | ORAL | Status: DC
  Administered 2017-12-09: 3.125 mg via ORAL
  Filled 2017-12-09: qty 1

## 2017-12-09 MED ORDER — CEPHALEXIN 500 MG PO CAPS: 500.0000 mg | ORAL_CAPSULE | Freq: Four times a day (QID) | ORAL | 0 refills | Status: DC

## 2017-12-09 MED ORDER — LIDOCAINE HCL URETHRAL/MUCOSAL 2 % EX GEL
1.0000 "application " | Freq: Once | CUTANEOUS | Status: AC
  Administered 2017-12-09: 1 via TOPICAL
  Filled 2017-12-09: qty 20

## 2017-12-09 MED ORDER — "TRANEXAMIC ACID 5% ORAL SOLUTION ": 10.0000 mL | Freq: Once | ORAL | Status: DC

## 2017-12-09 MED ORDER — TRANEXAMIC ACID 1000 MG/10ML IV SOLN
1000.0000 mg | Freq: Once | INTRAVENOUS | Status: AC
  Administered 2017-12-09: 1000 mg via TOPICAL
  Filled 2017-12-09 (×2): qty 10

## 2017-12-09 NOTE — ED Notes (Signed)
Patient still having nosebleed despite the TSA nebulizer given, MD notified.

## 2017-12-09 NOTE — Discharge Instructions (Addendum)
You were seen today for nosebleed.  Keep packing in place for the next 3 to 5 days.  Follow-up with ENT.  Take antibiotics as instructed.  You were given 1 dose of your blood pressure medication Coreg, this morning.  Continue blood pressure medications as prescribed.

## 2017-12-09 NOTE — ED Notes (Signed)
Appears bleeding is controlled

## 2017-12-10 ENCOUNTER — Encounter (HOSPITAL_COMMUNITY): Payer: Self-pay

## 2017-12-10 ENCOUNTER — Emergency Department (HOSPITAL_COMMUNITY)
Admission: EM | Admit: 2017-12-10 | Discharge: 2017-12-10 | Disposition: A | Discharge status: Skilled Nursing Facility | Payer: Medicare Other | Attending: Emergency Medicine | Admitting: Emergency Medicine

## 2017-12-10 ENCOUNTER — Telehealth: Payer: Self-pay | Admitting: Internal Medicine

## 2017-12-10 DIAGNOSIS — I130 Hypertensive heart and chronic kidney disease with heart failure and stage 1 through stage 4 chronic kidney disease, or unspecified chronic kidney disease: Secondary | ICD-10-CM | POA: Insufficient documentation

## 2017-12-10 DIAGNOSIS — R58 Hemorrhage, not elsewhere classified: Secondary | ICD-10-CM | POA: Diagnosis not present

## 2017-12-10 DIAGNOSIS — R040 Epistaxis: Secondary | ICD-10-CM | POA: Insufficient documentation

## 2017-12-10 DIAGNOSIS — Z79899 Other long term (current) drug therapy: Secondary | ICD-10-CM | POA: Diagnosis not present

## 2017-12-10 DIAGNOSIS — N183 Chronic kidney disease, stage 3 (moderate): Secondary | ICD-10-CM | POA: Diagnosis not present

## 2017-12-10 DIAGNOSIS — I5032 Chronic diastolic (congestive) heart failure: Secondary | ICD-10-CM | POA: Diagnosis not present

## 2017-12-10 DIAGNOSIS — I4891 Unspecified atrial fibrillation: Secondary | ICD-10-CM | POA: Diagnosis not present

## 2017-12-10 DIAGNOSIS — Z7901 Long term (current) use of anticoagulants: Secondary | ICD-10-CM | POA: Insufficient documentation

## 2017-12-10 DIAGNOSIS — R0902 Hypoxemia: Secondary | ICD-10-CM | POA: Diagnosis not present

## 2017-12-10 DIAGNOSIS — I10 Essential (primary) hypertension: Secondary | ICD-10-CM | POA: Diagnosis not present

## 2017-12-10 DIAGNOSIS — Z7401 Bed confinement status: Secondary | ICD-10-CM | POA: Diagnosis not present

## 2017-12-10 DIAGNOSIS — M2550 Pain in unspecified joint: Secondary | ICD-10-CM | POA: Diagnosis not present

## 2017-12-10 LAB — CBC
HCT: 45.1 % (ref 36.0–46.0)
Hemoglobin: 15.1 g/dL — ABNORMAL HIGH (ref 12.0–15.0)
MCH: 29.8 pg (ref 26.0–34.0)
MCHC: 33.5 g/dL (ref 30.0–36.0)
MCV: 89 fL (ref 78.0–100.0)
Platelets: 222 10*3/uL (ref 150–400)
RBC: 5.07 MIL/uL (ref 3.87–5.11)
RDW: 14.1 % (ref 11.5–15.5)
WBC: 14.5 10*3/uL — ABNORMAL HIGH (ref 4.0–10.5)

## 2017-12-10 MED ORDER — ONDANSETRON 8 MG PO TBDP
8.0000 mg | ORAL_TABLET | Freq: Once | ORAL | Status: AC
  Administered 2017-12-10: 8 mg via ORAL
  Filled 2017-12-10: qty 1

## 2017-12-10 NOTE — ED Triage Notes (Signed)
Per EMS; Pt had epistaxis 3 days ago.  [facility].  Nose is still bleeding, even with balloon.  facility states pt urinated on herself, which is unusual for her.  Pt is on blood thinners.

## 2017-12-10 NOTE — Telephone Encounter (Signed)
Copied from [facility] [id number]. Topic: Quick Communication - See Telephone Encounter >> Dec 10, 2017  8:51 AM Margot Ables wrote: CRM for notification. See Telephone encounter for: 12/10/17.  Pt is going to be sent back to the hospital. She was in ER 12/08/17 for severe nose bleed. Pt takes Xarelto. A balloon rocket was placed but it is now full of blood and pt demeanor has changed. Pt has been urinating on herself which is not normal for her.

## 2017-12-10 NOTE — ED Notes (Signed)
Attempted to call [facility] to give report

## 2017-12-10 NOTE — ED Notes (Signed)
[facility] called for transport.  

## 2017-12-10 NOTE — Telephone Encounter (Signed)
Noted  

## 2017-12-10 NOTE — Discharge Instructions (Addendum)
Please hold the xarelto for the next 4 days

## 2017-12-10 NOTE — ED Notes (Signed)
Bed: WA05 Expected date:  Expected time:  Means of arrival:  Comments: EMS-nosebleed 

## 2017-12-10 NOTE — ED Provider Notes (Signed)
[facility] DEPT Provider Note   CSN: [id number] Arrival date & time: 12/10/17  0935     History   Chief Complaint Chief Complaint  Patient presents with  . Epistaxis    HPI Tracy Rodriguez is a 84 y.o. female.  HPI Patient is an 84 year old female presents the emergency department requesting removal of her nasal packing which was placed on December 08, 2017 in the emergency department for epistaxis.  This is left-sided.  She is on Xarelto.  Per the [facility] her last dose of Xarelto was yesterday morning.  Staff at the facility report there is still a small amount of bleeding coming around the balloon.  Patient is without significant complaints at this time.  Denies weakness.  No active bleeding at time my evaluation in the room.  There is evidence of crusted old blood surrounding the left nare and nasal packing   Past Medical History:  Diagnosis Date  . Arthritis    "joints" (08/04/2014)  . Bipolar 1 disorder ([place])   . Cancer of right breast ([place])   . CHF (congestive heart failure) ([place])   . Chronic lower back pain   . Hyperlipemia   . Hypertension   . PAF (paroxysmal atrial fibrillation) [facility])     Patient Active Problem List   Diagnosis Date Noted  . Functional dyspnea (2/2 posture/positioning w/ abnormal sprirometry) 10/03/2016  . Bipolar 1 disorder ([place]) 10/03/2016  . PAF (paroxysmal atrial fibrillation) ([place]) 10/03/2016  . Hypertension 10/03/2016  . Orthostatic syncope 10/03/2016  . Diarrhea 10/03/2016  . Chronic diastolic heart failure ([place]) 10/03/2016  . Supratherapeutic INR 10/03/2016  . Dementia ([place]) 10/03/2016  . Hyperlipemia 10/03/2016  . CKD (chronic kidney disease) stage 3, GFR 30-59 ml/min (HCC) 10/03/2016  . Symptomatic bradycardia   . Dyspnea 09/16/2016  . CHF (congestive heart failure) ([place]) 03/04/2016  . Acute renal failure superimposed on stage 3 chronic kidney disease ([place]) 02/08/2016  . Fall   . Syncope 12/05/2015  .  Physical deconditioning 12/05/2015  . Encounter for therapeutic drug monitoring 12/22/2014  . Atrial fibrillation with RVR ([place]) 08/04/2014  . Paroxysmal atrial fibrillation ([place]) 08/04/2014  . Hypertensive urgency   . Obesity 09/27/2013  . Chronic back pain 09/14/2012  . Bipolar disorder ([place]) 09/14/2012    Past Surgical History:  Procedure Laterality Date  . AUGMENTATION MAMMAPLASTY Bilateral   . BACK SURGERY    . BREAST BIOPSY Right   . BREAST RECONSTRUCTION Bilateral   . CATARACT EXTRACTION W/ INTRAOCULAR LENS  IMPLANT, BILATERAL Bilateral   . EXCISIONAL HEMORRHOIDECTOMY    . [place]; 1973; 1985   "ruptured discs each time"  . MASTECTOMY Right    cancer  . PLACEMENT OF BREAST IMPLANTS Bilateral    "had to take tissue out of left"  . TONSILLECTOMY    . VAGINAL HYSTERECTOMY       OB History   None      Home Medications    Prior to Admission medications   Medication Sig Start Date End Date Taking? Authorizing Provider  acetaminophen (TYLENOL) 500 MG tablet Take 500 mg by mouth every 4 (four) hours as needed for mild pain or headache.   Yes [provider]  alum & mag hydroxide-simeth (MAALOX/MYLANTA) 200-200-20 MG/5ML suspension Take 30 mLs by mouth as needed for indigestion.    Yes [provider]  atorvastatin (LIPITOR) 40 MG tablet Take 40 mg by mouth daily.   Yes [provider]  carboxymethylcellul-glycerin (  OPTIVE) 0.5-0.9 % ophthalmic solution Place 1 drop into both eyes 4 (four) times daily.   Yes [provider]  carvedilol (COREG) 3.125 MG tablet Take 1 tablet (3.125 mg total) by mouth 2 (two) times daily with a meal. PLEASE START THE LOWER DOSE ON Monday, 7/30 DEPENDING ON HER HEART RATE. OK TO START IF HEART RATE IS NOT LESS THAN 65. 10/07/16  Yes Bonnielee Haff, MD  Cranberry-Vitamin C-Probiotic (AZO CRANBERRY) 250-30 MG TABS Take 1 tablet by mouth daily.   Yes [provider]  digoxin (LANOXIN)  0.125 MG tablet Take 0.0625 mg by mouth daily. Hold if  pulse is less than 60   Yes [provider]  diltiazem (CARDIZEM CD) 120 MG 24 hr capsule Take 120 mg daily by mouth.   Yes [provider]  escitalopram (LEXAPRO) 10 MG tablet TAKE 1 TABLET(10 MG) BY MOUTH DAILY 09/29/17  Yes Marletta Lor, MD  gabapentin (NEURONTIN) 100 MG capsule Take 100 mg by mouth at bedtime.   Yes [provider]  guaifenesin (ROBITUSSIN) 100 MG/5ML syrup Take 200 mg by mouth every 6 (six) hours as needed for cough.   Yes [provider]  loperamide (IMODIUM) 2 MG capsule Take 2 mg See admin instructions by mouth. Standing order: Take 1 capsule  By mouth each  Loose stool as needed for Diarrhea (Do not Exceed 8  Doses in 24hrs   Yes [provider]  magnesium hydroxide (MILK OF MAGNESIA) 400 MG/5ML suspension Take 30 mLs by mouth daily as needed for mild constipation.   Yes [provider]  Melatonin 3 MG TABS Take 3 mg by mouth at bedtime.   Yes [provider]  Neomycin-Bacitracin-Polymyxin ([employer] TRIPLE ANTIBIOTIC) [phone] OINT Apply 1 application topically as needed. For skin tear. Abrasions* Clean area with normal saline, apply TAO, cover with bandaid or Gauze and tape. Change as needed until healed.   Yes [provider]  oxymetazoline (AFRIN) 0.05 % nasal spray Place 1 spray into both nostrils 2 (two) times daily as needed (congestion, nose bleed.).    Yes [provider]  QUEtiapine (SEROQUEL) 25 MG tablet Take 1 tablet (25 mg total) by mouth at bedtime. 10/05/16  Yes Bonnielee Haff, MD  Rivaroxaban (XARELTO) 15 MG TABS tablet Take 15 mg by mouth daily.   Yes [provider]  sennosides-docusate sodium (SENOKOT-S) 8.6-50 MG tablet Take 1 tablet by mouth daily. Patient taking differently: Take 1 tablet by mouth 2 (two) times daily.  05/28/16  Yes Marletta Lor, MD  traMADol (ULTRAM) 50 MG tablet Take 1 tablet (50  mg total) by mouth See admin instructions. Take 50 mg by mouth three times a day at 8:00 am, 14:00 and 22:00 Also take 50 mg by mouth every four hours as needed for sever pain not to exceed total 4/day 10/05/16  Yes Bonnielee Haff, MD  traZODone (DESYREL) 150 MG tablet Take 1 tablet (150 mg total) by mouth at bedtime. 10/05/16  Yes Bonnielee Haff, MD  albuterol (PROVENTIL) (2.5 MG/3ML) 0.083% nebulizer solution Take 3 mLs (2.5 mg total) by nebulization every 6 (six) hours as needed for wheezing or shortness of breath. Patient not taking: Reported on 01/17/2017 09/16/16   Magdalen Spatz, NP  cephALEXin (KEFLEX) 500 MG capsule Take 1 capsule (500 mg total) by mouth 4 (four) times daily. Patient not taking: Reported on 12/10/2017 12/09/17   Horton, Barbette Hair, MD  polyethylene glycol powder (GLYCOLAX/MIRALAX) powder Take 17 g by mouth  daily as needed for moderate constipation. Patient not taking: Reported on 12/10/2017 10/05/16   Bonnielee Haff, MD  Psyllium [facility]) WAFR Take 2 Wafers by mouth daily. Prn constipation Patient not taking: Reported on 12/10/2017 03/25/16   Briscoe Deutscher, DO  warfarin (COUMADIN) 1 MG tablet Take 1-4 tablets (1-4 mg total) by mouth daily. Take 2mg  daily starting 7/28. Check PT/INR on 7/30 and then dose accordingly. Patient taking differently: Take 2-4 mg daily by mouth. Take 2mg  on Monday, Wednesday and Friday Take 4 mg on "Sunday , Tuesday, Thursday and Saturday 10/05/16   Krishnan, Gokul, MD    Family History Family History  Problem Relation Age of Onset  . Stroke Mother   . Heart disease Father   . Alzheimer's disease Sister   . Leukemia Brother   . Arthritis Maternal Grandmother   . Breast cancer Daughter     Social History Social History   Tobacco Use  . Smoking status: Never Smoker  . Smokeless tobacco: Never Used  Substance Use Topics  . Alcohol use: No  . Drug use: No     Allergies   Codeine; Morphine and related; Valium [diazepam]; Sulfa  antibiotics; Darvon [propoxyphene]; Floxin [ofloxacin]; Levsin [hyoscyamine sulfate]; Penicillins; and Zantac [ranitidine hcl]   Review of Systems Review of Systems  All other systems reviewed and are negative.    Physical Exam Updated Vital Signs BP (!) 180/99 (BP Location: Left Arm)   Pulse 83   Temp 98 F (36.7 C) (Oral)   Resp 18   LMP 10/10/1969   SpO2 94%   Physical Exam  Constitutional: She is oriented to person, place, and time. She appears well-developed and well-nourished.  HENT:  Head: Normocephalic.  Nasal balloon packing removed from left nare without difficulty.  No active bleeding.  Small amount of clot remains within the left nare.  No posterior pharyngeal bleeding  Eyes: EOM are normal.  Neck: Normal range of motion.  Cardiovascular: Normal rate and regular rhythm.  Pulmonary/Chest: Effort normal.  Abdominal: She exhibits no distension.  Musculoskeletal: Normal range of motion.  Neurological: She is alert and oriented to person, place, and time.  Psychiatric: She has a normal mood and affect.  Nursing note and vitals reviewed.    ED Treatments / Results  Labs (all labs ordered are listed, but only abnormal results are displayed) Labs Reviewed  CBC - Abnormal; Notable for the following components:      Result Value   WBC 14.5 (*)    Hemoglobin 15.1 (*)    All other components within normal limits    EKG None  Radiology No results found.  Procedures Procedures (including critical care time)  Medications Ordered in ED Medications  ondansetron (ZOFRAN-ODT) disintegrating tablet 8 mg (8 mg Oral Given 12/10/17 1022)     Initial Impression / Assessment and Plan / ED Course  I have reviewed the triage vital signs and the nursing notes.  Pertinent labs & imaging results that were available during my care of the patient were reviewed by me and considered in my medical decision making (see chart for details).     11" :38 AM No active bleeding.   Patient will hold her Xarelto for the next 4 days.  ENT and primary care follow-up.  Final Clinical Impressions(s) / ED Diagnoses   Final diagnoses:  Left-sided epistaxis    ED Discharge Orders    None       Jola Schmidt, MD 12/10/17 1138

## 2017-12-11 ENCOUNTER — Emergency Department (HOSPITAL_COMMUNITY): Payer: Medicare Other

## 2017-12-11 ENCOUNTER — Encounter (HOSPITAL_COMMUNITY): Payer: Self-pay | Admitting: Neurology

## 2017-12-11 ENCOUNTER — Emergency Department (HOSPITAL_COMMUNITY)
Admission: EM | Admit: 2017-12-11 | Discharge: 2017-12-11 | Disposition: A | Discharge status: Home / Self Care | Payer: Medicare Other | Attending: Emergency Medicine | Admitting: Emergency Medicine

## 2017-12-11 DIAGNOSIS — M549 Dorsalgia, unspecified: Secondary | ICD-10-CM | POA: Diagnosis not present

## 2017-12-11 DIAGNOSIS — Z79899 Other long term (current) drug therapy: Secondary | ICD-10-CM | POA: Diagnosis not present

## 2017-12-11 DIAGNOSIS — Y92122 Bedroom in nursing home as the place of occurrence of the external cause: Secondary | ICD-10-CM | POA: Diagnosis not present

## 2017-12-11 DIAGNOSIS — F0390 Unspecified dementia without behavioral disturbance: Secondary | ICD-10-CM | POA: Diagnosis not present

## 2017-12-11 DIAGNOSIS — S199XXA Unspecified injury of neck, initial encounter: Secondary | ICD-10-CM | POA: Diagnosis not present

## 2017-12-11 DIAGNOSIS — S299XXA Unspecified injury of thorax, initial encounter: Secondary | ICD-10-CM | POA: Diagnosis not present

## 2017-12-11 DIAGNOSIS — Z7901 Long term (current) use of anticoagulants: Secondary | ICD-10-CM | POA: Diagnosis not present

## 2017-12-11 DIAGNOSIS — W19XXXA Unspecified fall, initial encounter: Secondary | ICD-10-CM | POA: Diagnosis not present

## 2017-12-11 DIAGNOSIS — R296 Repeated falls: Secondary | ICD-10-CM | POA: Diagnosis not present

## 2017-12-11 DIAGNOSIS — Y999 Unspecified external cause status: Secondary | ICD-10-CM | POA: Diagnosis not present

## 2017-12-11 DIAGNOSIS — Y9389 Activity, other specified: Secondary | ICD-10-CM | POA: Diagnosis not present

## 2017-12-11 DIAGNOSIS — M545 Low back pain: Secondary | ICD-10-CM | POA: Diagnosis not present

## 2017-12-11 DIAGNOSIS — S3992XA Unspecified injury of lower back, initial encounter: Secondary | ICD-10-CM | POA: Diagnosis not present

## 2017-12-11 DIAGNOSIS — S0990XA Unspecified injury of head, initial encounter: Secondary | ICD-10-CM | POA: Diagnosis not present

## 2017-12-11 DIAGNOSIS — R531 Weakness: Secondary | ICD-10-CM | POA: Diagnosis not present

## 2017-12-11 DIAGNOSIS — M2550 Pain in unspecified joint: Secondary | ICD-10-CM | POA: Diagnosis not present

## 2017-12-11 DIAGNOSIS — S0993XA Unspecified injury of face, initial encounter: Secondary | ICD-10-CM | POA: Diagnosis present

## 2017-12-11 DIAGNOSIS — M546 Pain in thoracic spine: Secondary | ICD-10-CM | POA: Diagnosis not present

## 2017-12-11 DIAGNOSIS — M542 Cervicalgia: Secondary | ICD-10-CM | POA: Diagnosis not present

## 2017-12-11 DIAGNOSIS — S51001A Unspecified open wound of right elbow, initial encounter: Secondary | ICD-10-CM | POA: Insufficient documentation

## 2017-12-11 DIAGNOSIS — W010XXA Fall on same level from slipping, tripping and stumbling without subsequent striking against object, initial encounter: Secondary | ICD-10-CM | POA: Insufficient documentation

## 2017-12-11 DIAGNOSIS — N183 Chronic kidney disease, stage 3 (moderate): Secondary | ICD-10-CM | POA: Insufficient documentation

## 2017-12-11 DIAGNOSIS — S0083XA Contusion of other part of head, initial encounter: Secondary | ICD-10-CM | POA: Diagnosis not present

## 2017-12-11 DIAGNOSIS — Z23 Encounter for immunization: Secondary | ICD-10-CM | POA: Diagnosis not present

## 2017-12-11 DIAGNOSIS — Z853 Personal history of malignant neoplasm of breast: Secondary | ICD-10-CM | POA: Insufficient documentation

## 2017-12-11 DIAGNOSIS — I130 Hypertensive heart and chronic kidney disease with heart failure and stage 1 through stage 4 chronic kidney disease, or unspecified chronic kidney disease: Secondary | ICD-10-CM | POA: Diagnosis not present

## 2017-12-11 DIAGNOSIS — N3000 Acute cystitis without hematuria: Secondary | ICD-10-CM | POA: Insufficient documentation

## 2017-12-11 DIAGNOSIS — Z7401 Bed confinement status: Secondary | ICD-10-CM | POA: Diagnosis not present

## 2017-12-11 DIAGNOSIS — R58 Hemorrhage, not elsewhere classified: Secondary | ICD-10-CM | POA: Diagnosis not present

## 2017-12-11 DIAGNOSIS — I5032 Chronic diastolic (congestive) heart failure: Secondary | ICD-10-CM | POA: Insufficient documentation

## 2017-12-11 LAB — CBC WITH DIFFERENTIAL/PLATELET
Abs Immature Granulocytes: 0 10*3/uL (ref 0.0–0.1)
Basophils Absolute: 0 10*3/uL (ref 0.0–0.1)
Basophils Relative: 0 %
Eosinophils Absolute: 0.1 10*3/uL (ref 0.0–0.7)
Eosinophils Relative: 1 %
HCT: 41.6 % (ref 36.0–46.0)
Hemoglobin: 13.1 g/dL (ref 12.0–15.0)
Immature Granulocytes: 0 %
Lymphocytes Relative: 18 %
Lymphs Abs: 1.7 10*3/uL (ref 0.7–4.0)
MCH: 29 pg (ref 26.0–34.0)
MCHC: 31.5 g/dL (ref 30.0–36.0)
MCV: 92.2 fL (ref 78.0–100.0)
Monocytes Absolute: 0.9 10*3/uL (ref 0.1–1.0)
Monocytes Relative: 9 %
Neutro Abs: 6.9 10*3/uL (ref 1.7–7.7)
Neutrophils Relative %: 72 %
Platelets: 218 10*3/uL (ref 150–400)
RBC: 4.51 MIL/uL (ref 3.87–5.11)
RDW: 13.9 % (ref 11.5–15.5)
WBC: 9.7 10*3/uL (ref 4.0–10.5)

## 2017-12-11 LAB — URINALYSIS, ROUTINE W REFLEX MICROSCOPIC
Bilirubin Urine: NEGATIVE
Glucose, UA: NEGATIVE mg/dL
Hgb urine dipstick: NEGATIVE
Ketones, ur: NEGATIVE mg/dL
Nitrite: POSITIVE — AB
Protein, ur: 100 mg/dL — AB
Specific Gravity, Urine: 1.02 (ref 1.005–1.030)
WBC, UA: >50 WBC/hpf — ABNORMAL HIGH (ref 0–5)
pH: 5 (ref 5.0–8.0)

## 2017-12-11 LAB — BASIC METABOLIC PANEL
Anion gap: 10 (ref 5–15)
BUN: 28 mg/dL — ABNORMAL HIGH (ref 8–23)
CO2: 26 mmol/L (ref 22–32)
Calcium: 8.8 mg/dL — ABNORMAL LOW (ref 8.9–10.3)
Chloride: 102 mmol/L (ref 98–111)
Creatinine, Ser: 1.84 mg/dL — ABNORMAL HIGH (ref 0.44–1.00)
GFR calc Af Amer: 28 mL/min — ABNORMAL LOW (ref >60)
GFR calc non Af Amer: 24 mL/min — ABNORMAL LOW (ref >60)
Glucose, Bld: 98 mg/dL (ref 70–99)
Potassium: 4 mmol/L (ref 3.5–5.1)
Sodium: 138 mmol/L (ref 135–145)

## 2017-12-11 MED ORDER — TETANUS-DIPHTH-ACELL PERTUSSIS 5-2.5-18.5 LF-MCG/0.5 IM SUSP
0.5000 mL | Freq: Once | INTRAMUSCULAR | Status: AC
  Administered 2017-12-11: 0.5 mL via INTRAMUSCULAR
  Filled 2017-12-11: qty 0.5

## 2017-12-11 MED ORDER — TRAMADOL HCL 50 MG PO TABS
50.0000 mg | ORAL_TABLET | Freq: Once | ORAL | Status: AC
  Administered 2017-12-11: 50 mg via ORAL
  Filled 2017-12-11: qty 1

## 2017-12-11 MED ORDER — SODIUM CHLORIDE 0.9 % IV SOLN
1.0000 g | Freq: Once | INTRAVENOUS | Status: DC
  Filled 2017-12-11: qty 10

## 2017-12-11 MED ORDER — FOSFOMYCIN TROMETHAMINE 3 G PO PACK
3.0000 g | PACK | Freq: Once | ORAL | Status: AC
  Administered 2017-12-11: 3 g via ORAL
  Filled 2017-12-11: qty 3

## 2017-12-11 MED ORDER — CEFTRIAXONE SODIUM 1 G IJ SOLR
1.0000 g | Freq: Once | INTRAMUSCULAR | Status: AC
Start: 2017-12-11 — End: 2017-12-11
  Administered 2017-12-11: 1 g via INTRAMUSCULAR
  Filled 2017-12-11: qty 10

## 2017-12-11 MED ORDER — LIDOCAINE HCL (PF) 1 % IJ SOLN
2.1000 mL | Freq: Once | INTRAMUSCULAR | Status: AC
  Administered 2017-12-11: 2.1 mL
  Filled 2017-12-11: qty 5

## 2017-12-11 NOTE — ED Notes (Signed)
[facility] called for transport to [facility], 12th on list for pick up

## 2017-12-11 NOTE — ED Triage Notes (Signed)
Per ems- pt comes from [facility]. Fell while getting out of bed this morning for breakfast. On floor for about 10 mins. When ems arrived lying supine. C/o upper back pain, neck is sore when turning side to side. Does take xarelto. Was at [facility] yesterday for nose bleed. Has bruise to left forehead from previous fall. Skin tear to right elbow, left shoulder. BP 136/92.

## 2017-12-11 NOTE — ED Notes (Signed)
Pt is still 8th on PT[facility] list

## 2017-12-11 NOTE — Discharge Instructions (Signed)
Your urine will need to be rechecked in the next few days if your symptoms are not improving.  Your creatinine was mildly elevated as compared to prior values.  This needs to be followed by your primary care provider.  Return to the emergency department if you have any worsening symptoms including increased weakness, vomiting, fevers, increased confusion or other worsening symptoms.

## 2017-12-11 NOTE — ED Notes (Signed)
IV team unable to get IV. Dr Tamera Punt reports okay to change to IM for administration.

## 2017-12-11 NOTE — ED Notes (Signed)
Patient verbalizes understanding of discharge instructions. Opportunity for questioning and answers were provided. Armband removed by staff, pt discharged from ED.  

## 2017-12-11 NOTE — ED Provider Notes (Signed)
[facility] EMERGENCY DEPARTMENT Provider Note   CSN: [id number] Arrival date & time: 12/11/17  7341     History   Chief Complaint Chief Complaint  Patient presents with  . Fall    HPI Tracy Rodriguez is a 84 y.o. female  Patient is a 84 year old female with a history of atrial fibrillation, on Xarelto, CHF, bipolar disorder and schizoaffective disorder who presents after a fall.  She lives at [facility].  She states that she does not really know why she fell.  She was getting up out of her bed to use her walker and fell on the ground.  She has a bump on her head but she states that happened yesterday when she had a fall.  She does not remember how she fell yesterday.  She seemed to be a little bit confused but it is unclear if this is her baseline mental status.  She has been seen here a couple times recently for nosebleeds.  She complains of pain in her neck and back but denies any other injuries from the fall.  She states she feels a little bit weaker than normal.  She denies any chest pain or shortness of breath.  No vomiting or diarrhea.  No fevers.      Past Medical History:  Diagnosis Date  . Arthritis    "joints" (08/04/2014)  . Bipolar 1 disorder ([place])   . Cancer of right breast ([place])   . CHF (congestive heart failure) ([place])   . Chronic lower back pain   . Hyperlipemia   . Hypertension   . PAF (paroxysmal atrial fibrillation) [facility])     Patient Active Problem List   Diagnosis Date Noted  . Functional dyspnea (2/2 posture/positioning w/ abnormal sprirometry) 10/03/2016  . Bipolar 1 disorder ([place]) 10/03/2016  . PAF (paroxysmal atrial fibrillation) ([place]) 10/03/2016  . Hypertension 10/03/2016  . Orthostatic syncope 10/03/2016  . Diarrhea 10/03/2016  . Chronic diastolic heart failure ([place]) 10/03/2016  . Supratherapeutic INR 10/03/2016  . Dementia ([place]) 10/03/2016  . Hyperlipemia 10/03/2016  . CKD (chronic kidney disease) stage 3, GFR 30-59  ml/min (HCC) 10/03/2016  . Symptomatic bradycardia   . Dyspnea 09/16/2016  . CHF (congestive heart failure) ([place]) 03/04/2016  . Acute renal failure superimposed on stage 3 chronic kidney disease ([place]) 02/08/2016  . Fall   . Syncope 12/05/2015  . Physical deconditioning 12/05/2015  . Encounter for therapeutic drug monitoring 12/22/2014  . Atrial fibrillation with RVR ([place]) 08/04/2014  . Paroxysmal atrial fibrillation ([place]) 08/04/2014  . Hypertensive urgency   . Obesity 09/27/2013  . Chronic back pain 09/14/2012  . Bipolar disorder ([place]) 09/14/2012    Past Surgical History:  Procedure Laterality Date  . AUGMENTATION MAMMAPLASTY Bilateral   . BACK SURGERY    . BREAST BIOPSY Right   . BREAST RECONSTRUCTION Bilateral   . CATARACT EXTRACTION W/ INTRAOCULAR LENS  IMPLANT, BILATERAL Bilateral   . EXCISIONAL HEMORRHOIDECTOMY    . [place]; 1973; 1985   "ruptured discs each time"  . MASTECTOMY Right    cancer  . PLACEMENT OF BREAST IMPLANTS Bilateral    "had to take tissue out of left"  . TONSILLECTOMY    . VAGINAL HYSTERECTOMY       OB History   None      Home Medications    Prior to Admission medications   Medication Sig Start Date End Date Taking? Authorizing Provider  acetaminophen (TYLENOL) 500 MG tablet Take 500  mg by mouth every 4 (four) hours as needed for mild pain or headache.   Yes [provider]  alum & mag hydroxide-simeth (MAALOX/MYLANTA) 200-200-20 MG/5ML suspension Take 30 mLs by mouth as needed for indigestion.    Yes [provider]  atorvastatin (LIPITOR) 40 MG tablet Take 40 mg by mouth daily.   Yes [provider]  carboxymethylcellul-glycerin (OPTIVE) 0.5-0.9 % ophthalmic solution Place 1 drop into both eyes 4 (four) times daily.   Yes [provider]  carvedilol (COREG) 3.125 MG tablet Take 1 tablet (3.125 mg total) by mouth 2 (two) times daily with a meal. PLEASE START THE LOWER DOSE ON Monday, 7/30  DEPENDING ON HER HEART RATE. OK TO START IF HEART RATE IS NOT LESS THAN 65. 10/07/16  Yes Bonnielee Haff, MD  Cranberry-Vitamin C-Probiotic (AZO CRANBERRY) 250-30 MG TABS Take 1 tablet by mouth daily.   Yes [provider]  digoxin (LANOXIN) 0.125 MG tablet Take 0.0625 mg by mouth daily. Hold if  pulse is less than 60   Yes [provider]  diltiazem (CARDIZEM CD) 120 MG 24 hr capsule Take 120 mg daily by mouth.   Yes [provider]  escitalopram (LEXAPRO) 10 MG tablet TAKE 1 TABLET(10 MG) BY MOUTH DAILY 09/29/17  Yes Marletta Lor, MD  gabapentin (NEURONTIN) 100 MG capsule Take 100 mg by mouth at bedtime.   Yes [provider]  guaifenesin (ROBITUSSIN) 100 MG/5ML syrup Take 200 mg by mouth every 6 (six) hours as needed for cough.   Yes [provider]  loperamide (IMODIUM) 2 MG capsule Take 2 mg See admin instructions by mouth. Standing order: Take 1 capsule  By mouth each  Loose stool as needed for Diarrhea (Do not Exceed 8  Doses in 24hrs   Yes [provider]  magnesium hydroxide (MILK OF MAGNESIA) 400 MG/5ML suspension Take 30 mLs by mouth daily as needed for mild constipation.   Yes [provider]  Melatonin 3 MG TABS Take 3 mg by mouth at bedtime.   Yes [provider]  Neomycin-Bacitracin-Polymyxin ([employer] TRIPLE ANTIBIOTIC) [phone] OINT Apply 1 application topically as needed. For skin tear. Abrasions* Clean area with normal saline, apply TAO, cover with bandaid or Gauze and tape. Change as needed until healed.   Yes [provider]  oxymetazoline (AFRIN) 0.05 % nasal spray Place 1 spray into both nostrils 2 (two) times daily as needed (congestion, nose bleed.).    Yes [provider]  QUEtiapine (SEROQUEL) 25 MG tablet Take 1 tablet (25 mg total) by mouth at bedtime. 10/05/16  Yes Bonnielee Haff, MD  Rivaroxaban (XARELTO) 15 MG TABS tablet Take 15 mg by mouth daily.   Yes [provider]  sennosides-docusate sodium (SENOKOT-S) 8.6-50 MG tablet Take 1 tablet by mouth daily. Patient taking differently: Take 1 tablet by mouth 2 (two) times daily.  05/28/16  Yes Marletta Lor, MD  sodium chloride (OCEAN) 0.65 % SOLN nasal spray Place 2 sprays into both nostrils daily.   Yes [provider]  traMADol (ULTRAM) 50 MG tablet Take 1 tablet (50 mg total) by mouth See admin instructions. Take 50 mg by mouth three times a day at 8:00 am, 14:00 and 22:00 Also take 50 mg by mouth every four hours as needed for sever pain not to exceed total 4/day 10/05/16  Yes Bonnielee Haff, MD  traZODone (DESYREL) 150 MG tablet Take 1 tablet (150 mg total) by mouth at bedtime. 10/05/16  Yes Bonnielee Haff, MD  albuterol (PROVENTIL) (2.5 MG/3ML) 0.083% nebulizer solution Take 3 mLs (2.5 mg total) by nebulization every 6 (six) hours as needed for wheezing or shortness of breath. Patient not taking: Reported on 01/17/2017 09/16/16   Magdalen Spatz, NP  cephALEXin (KEFLEX) 500 MG capsule Take 1 capsule (500 mg total) by mouth 4 (four) times daily. Patient not taking: Reported on 12/10/2017 12/09/17   Horton, Barbette Hair, MD  polyethylene glycol powder (GLYCOLAX/MIRALAX) powder Take 17 g by mouth daily as needed for moderate constipation. Patient not taking: Reported on 12/11/2017 10/05/16   Bonnielee Haff, MD  Psyllium [facility]) WAFR Take 2 Wafers by mouth daily. Prn constipation Patient not taking: Reported on 12/10/2017 03/25/16   Briscoe Deutscher, DO  warfarin (COUMADIN) 1 MG tablet Take 1-4 tablets (1-4 mg total) by mouth daily. Take 2mg  daily starting 7/28. Check PT/INR on 7/30 and then dose accordingly. Patient not taking: Reported on 12/10/2017 10/05/16   Bonnielee Haff, MD    Family History Family History  Problem Relation Age of Onset  . Stroke Mother   . Heart disease Father   . Alzheimer's disease Sister   . Leukemia Brother   . Arthritis Maternal Grandmother   . Breast cancer Daughter      Social History Social History   Tobacco Use  . Smoking status: Never Smoker  . Smokeless tobacco: Never Used  Substance Use Topics  . Alcohol use: No  . Drug use: No     Allergies   Codeine; Morphine and related; Valium [diazepam]; Sulfa antibiotics; Darvon [propoxyphene]; Floxin [ofloxacin]; Levsin [hyoscyamine sulfate]; Penicillins; and Zantac [ranitidine hcl]   Review of Systems Review of Systems  Constitutional: Positive for fatigue. Negative for chills, diaphoresis and fever.  HENT: Negative for congestion, rhinorrhea and sneezing.   Eyes: Negative.   Respiratory: Negative for cough, chest tightness and shortness of breath.   Cardiovascular: Negative for chest pain and leg swelling.  Gastrointestinal: Negative for abdominal pain, blood in stool, diarrhea, nausea and vomiting.  Genitourinary: Negative for difficulty urinating, flank pain, frequency and hematuria.  Musculoskeletal: Positive for back pain and neck pain. Negative for arthralgias.  Skin: Positive for wound. Negative for rash.  Neurological: Negative for dizziness, speech difficulty, weakness, numbness and headaches.     Physical Exam Updated Vital Signs BP (!) 146/62   Pulse 91   Temp 97.9 F (36.6 C) (Oral)   Resp 13   LMP 10/10/1969   SpO2 97%   Physical Exam  Constitutional: She appears well-developed and well-nourished.  HENT:  Hematoma to the left forehead  Eyes: Pupils are equal, round, and reactive to light.  Neck:  Positive tenderness along the cervical thoracic and lumbar sacral spine, no step-offs or deformities noted  Cardiovascular: Normal rate, regular rhythm and normal heart sounds.  Pulmonary/Chest: Effort normal and breath sounds normal. No respiratory distress. She has no wheezes. She has no rales. She exhibits no tenderness.  Abdominal: Soft. Bowel sounds are normal. There is no tenderness. There is no rebound and no guarding.  Musculoskeletal: Normal range of motion. She  exhibits no edema.  Small skin tear on the right elbow without underlying bony tenderness.  There is no other pain on palpation or range of motion of the extremities, including the hips  Lymphadenopathy:    She has no cervical adenopathy.  Neurological: She is alert.  Patient is alert and answers questions.  She is moving all extremities symmetrically.  She is oriented to person place.  She told me it was 19 when I asked her the year.  When I asked her if it was 2019, she said yes.  She cannot tell me the month.  Skin: Skin is warm and dry. No rash noted.  Psychiatric: She has a normal mood and affect.     ED Treatments / Results  Labs (all labs ordered are listed, but only abnormal results are displayed) Labs Reviewed  BASIC METABOLIC PANEL - Abnormal; Notable for the following components:      Result Value   BUN 28 (*)    Creatinine, Ser 1.84 (*)    Calcium 8.8 (*)    GFR calc non Af Amer 24 (*)    GFR calc Af Amer 28 (*)    All other components within normal limits  URINALYSIS, ROUTINE W REFLEX MICROSCOPIC - Abnormal; Notable for the following components:   Color, Urine AMBER (*)    APPearance HAZY (*)    Protein, ur 100 (*)    Nitrite POSITIVE (*)    Leukocytes, UA MODERATE (*)    WBC, UA >50 (*)    Bacteria, UA MANY (*)    All other components within normal limits  URINE CULTURE  CBC WITH DIFFERENTIAL/PLATELET    EKG None  Radiology Dg Thoracic Spine 2 View  Result Date: 12/11/2017 CLINICAL DATA:  Fall, mid to lower back pain EXAM: THORACIC SPINE 2 VIEWS COMPARISON:  Chest x-ray 10/03/2016 FINDINGS: Normal alignment. No fracture. Early degenerative spurring anteriorly in the midthoracic spine. IMPRESSION: No acute bony abnormality. Electronically Signed   By: Rolm Baptise M.D.   On: 12/11/2017 09:46   Dg Lumbar Spine Complete  Result Date: 12/11/2017 CLINICAL DATA:  Fall.  Mid to low back pain EXAM: LUMBAR SPINE - COMPLETE 4+ VIEW COMPARISON:  None. FINDINGS:  Degenerative disc disease and facet disease in the lower lumbar spine, most notable at L4-5. Diffuse osteopenia. No fracture or subluxation. SI joints are symmetric and unremarkable. Aortic atherosclerosis without visible aneurysm. IMPRESSION: Degenerative disc and facet disease in the lower lumbar spine. Osteopenia. No acute bony abnormality. Electronically Signed   By: Rolm Baptise M.D.   On: 12/11/2017 09:47   Ct Head Wo Contrast  Result Date: 12/11/2017 CLINICAL DATA:  [place] getting out of bed this morning for breakfast, on floor for 10 minutes, upper back pain neck soreness when turning side to side, on Xarelto, history breast cancer, CHF, EXAM: CT HEAD WITHOUT CONTRAST CT CERVICAL SPINE WITHOUT CONTRAST TECHNIQUE: Multidetector CT imaging of the head and cervical spine was performed following the standard protocol without intravenous contrast. Multiplanar CT image reconstructions of the cervical spine were also generated. COMPARISON:  10/03/2016 CT head FINDINGS: CT HEAD FINDINGS Brain: Generalized atrophy. Normal ventricular morphology. No midline shift or mass effect. Small vessel chronic ischemic changes of deep cerebral white matter. No intracranial hemorrhage, mass lesion, evidence of acute infarction, or extra-axial fluid collection. Vascular: Mild atherosclerotic calcifications of internal carotid arteries at skull base Skull: Calvaria intact.  Small LEFT frontal scalp hematoma. Sinuses/Orbits: Partially opacified LEFT ethmoid air cells. Remaining visualized paranasal sinuses and mastoid air cells clear Other: N/A CT CERVICAL SPINE FINDINGS Alignment: Minimal anterolisthesis at C5-C6, unchanged. Remaining alignments normal. Skull base and vertebrae: Osseous demineralization. Visualized skull base intact. Multilevel facet degenerative changes. Disc space narrowing with mild endplate spur formation C6-C7. Vertebral body heights maintained without fracture or bone destruction. Soft tissues and spinal  canal: Prevertebral soft tissues normal thickness. Cervical soft tissues unremarkable. Disc levels:  No additional abnormalities Upper chest: Calcified granuloma and minimal scarring at RIGHT apex. Other: N/A IMPRESSION: Atrophy with small vessel chronic ischemic changes of deep cerebral white matter. No acute intracranial abnormalities. Degenerative disc and facet disease changes of the cervical spine. No acute cervical spine abnormalities. Electronically Signed   By: Lavonia Dana M.D.   On: 12/11/2017 10:03   Ct Cervical Spine Wo Contrast  Result Date: 12/11/2017 CLINICAL DATA:  [place] getting out of bed this morning for breakfast, on floor for 10 minutes, upper back pain neck soreness when turning side to side, on Xarelto, history breast cancer, CHF, EXAM: CT HEAD WITHOUT CONTRAST CT CERVICAL SPINE WITHOUT CONTRAST TECHNIQUE: Multidetector CT imaging of the head and cervical spine was performed following the standard protocol without intravenous contrast. Multiplanar CT image reconstructions of the cervical spine were also generated. COMPARISON:  10/03/2016 CT head FINDINGS: CT HEAD FINDINGS Brain: Generalized atrophy. Normal ventricular morphology. No midline shift or mass effect. Small vessel chronic ischemic changes of deep cerebral white matter. No intracranial hemorrhage, mass lesion, evidence of acute infarction, or extra-axial fluid collection. Vascular: Mild atherosclerotic calcifications of internal carotid arteries at skull base Skull: Calvaria intact.  Small LEFT frontal scalp hematoma. Sinuses/Orbits: Partially opacified LEFT ethmoid air cells. Remaining visualized paranasal sinuses and mastoid air cells clear Other: N/A CT CERVICAL SPINE FINDINGS Alignment: Minimal anterolisthesis at C5-C6, unchanged. Remaining alignments normal. Skull base and vertebrae: Osseous demineralization. Visualized skull base intact. Multilevel facet degenerative changes. Disc space narrowing with mild endplate spur  formation C6-C7. Vertebral body heights maintained without fracture or bone destruction. Soft tissues and spinal canal: Prevertebral soft tissues normal thickness. Cervical soft tissues unremarkable. Disc levels:  No additional abnormalities Upper chest: Calcified granuloma and minimal scarring at RIGHT apex. Other: N/A IMPRESSION: Atrophy with small vessel chronic ischemic changes of deep cerebral white matter. No acute intracranial abnormalities. Degenerative disc and facet disease changes of the cervical spine. No acute cervical spine abnormalities. Electronically Signed   By: Lavonia Dana M.D.   On: 12/11/2017 10:03    Procedures Procedures (including critical care time)  Medications Ordered in ED Medications  fosfomycin (MONUROL) packet 3 g (has no administration in time range)  Tdap (BOOSTRIX) injection 0.5 mL (0.5 mLs Intramuscular Given 12/11/17 0843)  cefTRIAXone (ROCEPHIN) injection 1 g (1 g Intramuscular Given 12/11/17 1312)  lidocaine (PF) (XYLOCAINE) 1 % injection 2.1 mL (2.1 mLs Other Given 12/11/17 1317)  traMADol (ULTRAM) tablet 50 mg (50 mg Oral Given 12/11/17 1317)     Initial Impression / Assessment and Plan / ED Course  I have reviewed the triage vital signs and the nursing notes.  Pertinent labs & imaging results that were available during my care of the patient were reviewed by me and considered in my medical decision making (see chart for details).     Patient is a 84 year old female who presents after a fall.  She has no evidence of traumatic injuries.  She had a baseline mental status per her daughter although has been a little weaker than normal over the last couple of days and this is led to falls.  Her labs are non-concerning other than her creatinine is mildly elevated as compared to her baseline.  I did advise the daughter that this will need to be rechecked.  She does have evidence of urinary tract infection which could be the etiology for her increased weakness.  On  review of her last urine cultures, she did grow out VRE and the  one before that was E. coli.  She is currently on Keflex although this is only day 2.  This was for nasal packing.  I discussed the patient with the pharmacist who recommends a dose of fosfomycin.  This was given to the patient.  Her urine was sent for culture.  Her daughter was encouraged to have close follow-up by her PCP.  Return precautions were given.  Final Clinical Impressions(s) / ED Diagnoses   Final diagnoses:  Fall, initial encounter  Acute cystitis without hematuria    ED Discharge Orders    None       Malvin Johns, MD 12/11/17 1529

## 2017-12-13 LAB — URINE CULTURE: Culture: >=100000 — AB

## 2017-12-14 ENCOUNTER — Telehealth: Payer: Self-pay

## 2017-12-14 NOTE — Telephone Encounter (Signed)
Post ED Visit - Positive Culture Follow-up  Culture report reviewed by antimicrobial stewardship pharmacist:  []  Elenor Quinones, Pharm.D. []  Heide Guile, Pharm.D., BCPS AQ-ID []  Parks Neptune, Pharm.D., BCPS []  Alycia Rossetti, Pharm.D., BCPS []  [place], Pharm.D., BCPS, AAHIVP []  Legrand Como, Pharm.D., BCPS, AAHIVP []  Salome Arnt, PharmD, BCPS []  Johnnette Gourd, PharmD, BCPS [x]  Hughes Better, PharmD, BCPS []  Leeroy Cha, PharmD  Positive urine culture Treated with Fosfomycin, organism sensitive to the same and no further patient follow-up is required at this time.  Genia Del 12/14/2017, 9:41 AM

## 2017-12-15 DIAGNOSIS — I10 Essential (primary) hypertension: Secondary | ICD-10-CM | POA: Diagnosis not present

## 2017-12-15 DIAGNOSIS — Z79899 Other long term (current) drug therapy: Secondary | ICD-10-CM | POA: Diagnosis not present

## 2017-12-15 DIAGNOSIS — I480 Paroxysmal atrial fibrillation: Secondary | ICD-10-CM | POA: Diagnosis not present

## 2017-12-15 DIAGNOSIS — R040 Epistaxis: Secondary | ICD-10-CM | POA: Diagnosis not present

## 2017-12-15 DIAGNOSIS — Z7689 Persons encountering health services in other specified circumstances: Secondary | ICD-10-CM | POA: Diagnosis not present

## 2017-12-15 DIAGNOSIS — Z87440 Personal history of urinary (tract) infections: Secondary | ICD-10-CM | POA: Diagnosis not present

## 2017-12-18 DIAGNOSIS — N3000 Acute cystitis without hematuria: Secondary | ICD-10-CM | POA: Diagnosis not present

## 2017-12-18 DIAGNOSIS — M47816 Spondylosis without myelopathy or radiculopathy, lumbar region: Secondary | ICD-10-CM | POA: Diagnosis not present

## 2017-12-18 DIAGNOSIS — I480 Paroxysmal atrial fibrillation: Secondary | ICD-10-CM | POA: Diagnosis not present

## 2017-12-18 DIAGNOSIS — M47814 Spondylosis without myelopathy or radiculopathy, thoracic region: Secondary | ICD-10-CM | POA: Diagnosis not present

## 2017-12-19 DIAGNOSIS — M47816 Spondylosis without myelopathy or radiculopathy, lumbar region: Secondary | ICD-10-CM | POA: Diagnosis not present

## 2017-12-19 DIAGNOSIS — M47814 Spondylosis without myelopathy or radiculopathy, thoracic region: Secondary | ICD-10-CM | POA: Diagnosis not present

## 2017-12-23 DIAGNOSIS — M47816 Spondylosis without myelopathy or radiculopathy, lumbar region: Secondary | ICD-10-CM | POA: Diagnosis not present

## 2017-12-23 DIAGNOSIS — M47814 Spondylosis without myelopathy or radiculopathy, thoracic region: Secondary | ICD-10-CM | POA: Diagnosis not present

## 2017-12-25 DIAGNOSIS — M47816 Spondylosis without myelopathy or radiculopathy, lumbar region: Secondary | ICD-10-CM | POA: Diagnosis not present

## 2017-12-25 DIAGNOSIS — M47814 Spondylosis without myelopathy or radiculopathy, thoracic region: Secondary | ICD-10-CM | POA: Diagnosis not present

## 2017-12-29 DIAGNOSIS — M47814 Spondylosis without myelopathy or radiculopathy, thoracic region: Secondary | ICD-10-CM | POA: Diagnosis not present

## 2017-12-29 DIAGNOSIS — M47816 Spondylosis without myelopathy or radiculopathy, lumbar region: Secondary | ICD-10-CM | POA: Diagnosis not present

## 2018-01-01 DIAGNOSIS — M47816 Spondylosis without myelopathy or radiculopathy, lumbar region: Secondary | ICD-10-CM | POA: Diagnosis not present

## 2018-01-01 DIAGNOSIS — M47814 Spondylosis without myelopathy or radiculopathy, thoracic region: Secondary | ICD-10-CM | POA: Diagnosis not present

## 2018-01-05 DIAGNOSIS — I480 Paroxysmal atrial fibrillation: Secondary | ICD-10-CM | POA: Diagnosis not present

## 2018-01-05 DIAGNOSIS — Z8701 Personal history of pneumonia (recurrent): Secondary | ICD-10-CM | POA: Diagnosis not present

## 2018-01-05 DIAGNOSIS — R05 Cough: Secondary | ICD-10-CM | POA: Diagnosis not present

## 2018-01-05 DIAGNOSIS — I10 Essential (primary) hypertension: Secondary | ICD-10-CM | POA: Diagnosis not present

## 2018-01-06 DIAGNOSIS — R05 Cough: Secondary | ICD-10-CM | POA: Diagnosis not present

## 2018-01-07 DIAGNOSIS — M47816 Spondylosis without myelopathy or radiculopathy, lumbar region: Secondary | ICD-10-CM | POA: Diagnosis not present

## 2018-01-07 DIAGNOSIS — M47814 Spondylosis without myelopathy or radiculopathy, thoracic region: Secondary | ICD-10-CM | POA: Diagnosis not present

## 2018-01-09 DIAGNOSIS — M47816 Spondylosis without myelopathy or radiculopathy, lumbar region: Secondary | ICD-10-CM | POA: Diagnosis not present

## 2018-01-09 DIAGNOSIS — M47814 Spondylosis without myelopathy or radiculopathy, thoracic region: Secondary | ICD-10-CM | POA: Diagnosis not present

## 2018-01-12 DIAGNOSIS — N390 Urinary tract infection, site not specified: Secondary | ICD-10-CM | POA: Diagnosis not present

## 2018-01-12 DIAGNOSIS — I10 Essential (primary) hypertension: Secondary | ICD-10-CM | POA: Diagnosis not present

## 2018-01-12 DIAGNOSIS — I480 Paroxysmal atrial fibrillation: Secondary | ICD-10-CM | POA: Diagnosis not present

## 2018-01-12 DIAGNOSIS — I5043 Acute on chronic combined systolic (congestive) and diastolic (congestive) heart failure: Secondary | ICD-10-CM | POA: Diagnosis not present

## 2018-01-13 DIAGNOSIS — M47816 Spondylosis without myelopathy or radiculopathy, lumbar region: Secondary | ICD-10-CM | POA: Diagnosis not present

## 2018-01-13 DIAGNOSIS — M47814 Spondylosis without myelopathy or radiculopathy, thoracic region: Secondary | ICD-10-CM | POA: Diagnosis not present

## 2018-01-19 DIAGNOSIS — I480 Paroxysmal atrial fibrillation: Secondary | ICD-10-CM | POA: Diagnosis not present

## 2018-01-19 DIAGNOSIS — I10 Essential (primary) hypertension: Secondary | ICD-10-CM | POA: Diagnosis not present

## 2018-01-20 DIAGNOSIS — M47816 Spondylosis without myelopathy or radiculopathy, lumbar region: Secondary | ICD-10-CM | POA: Diagnosis not present

## 2018-01-20 DIAGNOSIS — M47814 Spondylosis without myelopathy or radiculopathy, thoracic region: Secondary | ICD-10-CM | POA: Diagnosis not present

## 2018-01-27 DIAGNOSIS — M47814 Spondylosis without myelopathy or radiculopathy, thoracic region: Secondary | ICD-10-CM | POA: Diagnosis not present

## 2018-01-27 DIAGNOSIS — M47816 Spondylosis without myelopathy or radiculopathy, lumbar region: Secondary | ICD-10-CM | POA: Diagnosis not present

## 2018-02-11 DIAGNOSIS — N390 Urinary tract infection, site not specified: Secondary | ICD-10-CM | POA: Diagnosis not present

## 2018-02-11 DIAGNOSIS — R319 Hematuria, unspecified: Secondary | ICD-10-CM | POA: Diagnosis not present

## 2018-02-11 DIAGNOSIS — Z79899 Other long term (current) drug therapy: Secondary | ICD-10-CM | POA: Diagnosis not present

## 2018-05-24 ENCOUNTER — Emergency Department (HOSPITAL_COMMUNITY)
Admission: EM | Admit: 2018-05-24 | Discharge: 2018-05-24 | Disposition: A | Discharge status: Home / Self Care | Payer: Medicare Other | Attending: Emergency Medicine | Admitting: Emergency Medicine

## 2018-05-24 ENCOUNTER — Emergency Department (HOSPITAL_COMMUNITY): Payer: Medicare Other

## 2018-05-24 ENCOUNTER — Other Ambulatory Visit: Payer: Self-pay

## 2018-05-24 DIAGNOSIS — I130 Hypertensive heart and chronic kidney disease with heart failure and stage 1 through stage 4 chronic kidney disease, or unspecified chronic kidney disease: Secondary | ICD-10-CM | POA: Insufficient documentation

## 2018-05-24 DIAGNOSIS — I5032 Chronic diastolic (congestive) heart failure: Secondary | ICD-10-CM | POA: Insufficient documentation

## 2018-05-24 DIAGNOSIS — N3000 Acute cystitis without hematuria: Secondary | ICD-10-CM

## 2018-05-24 DIAGNOSIS — R51 Headache: Secondary | ICD-10-CM | POA: Diagnosis present

## 2018-05-24 DIAGNOSIS — N183 Chronic kidney disease, stage 3 (moderate): Secondary | ICD-10-CM | POA: Insufficient documentation

## 2018-05-24 DIAGNOSIS — Z79899 Other long term (current) drug therapy: Secondary | ICD-10-CM | POA: Insufficient documentation

## 2018-05-24 DIAGNOSIS — W19XXXA Unspecified fall, initial encounter: Secondary | ICD-10-CM

## 2018-05-24 LAB — CBC WITH DIFFERENTIAL/PLATELET
Abs Immature Granulocytes: 0.03 10*3/uL (ref 0.00–0.07)
Basophils Absolute: 0 10*3/uL (ref 0.0–0.1)
Basophils Relative: 0 %
Eosinophils Absolute: 0 10*3/uL (ref 0.0–0.5)
Eosinophils Relative: 0 %
HCT: 44 % (ref 36.0–46.0)
Hemoglobin: 13.7 g/dL (ref 12.0–15.0)
Immature Granulocytes: 0 %
Lymphocytes Relative: 10 %
Lymphs Abs: 0.7 10*3/uL (ref 0.7–4.0)
MCH: 27.7 pg (ref 26.0–34.0)
MCHC: 31.1 g/dL (ref 30.0–36.0)
MCV: 88.9 fL (ref 80.0–100.0)
Monocytes Absolute: 0.4 10*3/uL (ref 0.1–1.0)
Monocytes Relative: 6 %
Neutro Abs: 6.1 10*3/uL (ref 1.7–7.7)
Neutrophils Relative %: 84 %
Platelets: 224 10*3/uL (ref 150–400)
RBC: 4.95 MIL/uL (ref 3.87–5.11)
RDW: 14.4 % (ref 11.5–15.5)
WBC: 7.3 10*3/uL (ref 4.0–10.5)
nRBC: 0 % (ref 0.0–0.2)

## 2018-05-24 LAB — COMPREHENSIVE METABOLIC PANEL
ALT: 15 U/L (ref 0–44)
AST: 32 U/L (ref 15–41)
Albumin: 3.8 g/dL (ref 3.5–5.0)
Alkaline Phosphatase: 74 U/L (ref 38–126)
Anion gap: 7 (ref 5–15)
BUN: 11 mg/dL (ref 8–23)
CO2: 24 mmol/L (ref 22–32)
Calcium: 9.1 mg/dL (ref 8.9–10.3)
Chloride: 102 mmol/L (ref 98–111)
Creatinine, Ser: 1.11 mg/dL — ABNORMAL HIGH (ref 0.44–1.00)
GFR calc Af Amer: 52 mL/min — ABNORMAL LOW (ref >60)
GFR calc non Af Amer: 45 mL/min — ABNORMAL LOW (ref >60)
Glucose, Bld: 120 mg/dL — ABNORMAL HIGH (ref 70–99)
Potassium: 3.3 mmol/L — ABNORMAL LOW (ref 3.5–5.1)
Sodium: 133 mmol/L — ABNORMAL LOW (ref 135–145)
Total Bilirubin: 1 mg/dL (ref 0.3–1.2)
Total Protein: 7.5 g/dL (ref 6.5–8.1)

## 2018-05-24 LAB — URINALYSIS, ROUTINE W REFLEX MICROSCOPIC
Bilirubin Urine: NEGATIVE
Glucose, UA: NEGATIVE mg/dL
Ketones, ur: NEGATIVE mg/dL
Nitrite: POSITIVE — AB
Protein, ur: NEGATIVE mg/dL
Specific Gravity, Urine: 1.01 (ref 1.005–1.030)
pH: 8 (ref 5.0–8.0)

## 2018-05-24 LAB — I-STAT TROPONIN, ED: Troponin i, poc: 0.03 ng/mL (ref 0.00–0.08)

## 2018-05-24 LAB — LACTIC ACID, PLASMA: Lactic Acid, Venous: 1.6 mmol/L (ref 0.5–1.9)

## 2018-05-24 LAB — DIGOXIN LEVEL: Digoxin Level: <0.2 ng/mL — ABNORMAL LOW (ref 0.8–2.0)

## 2018-05-24 LAB — CK: Total CK: 560 U/L — ABNORMAL HIGH (ref 38–234)

## 2018-05-24 LAB — LIPASE, BLOOD: Lipase: 21 U/L (ref 11–51)

## 2018-05-24 MED ORDER — SODIUM CHLORIDE 0.9 % IV BOLUS
500.0000 mL | Freq: Once | INTRAVENOUS | Status: AC
  Administered 2018-05-24: 500 mL via INTRAVENOUS

## 2018-05-24 MED ORDER — CEPHALEXIN 250 MG PO CAPS
500.0000 mg | ORAL_CAPSULE | Freq: Once | ORAL | Status: AC
  Administered 2018-05-24: 500 mg via ORAL
  Filled 2018-05-24: qty 2

## 2018-05-24 MED ORDER — IOHEXOL 300 MG/ML  SOLN
80.0000 mL | Freq: Once | INTRAMUSCULAR | Status: AC | PRN
  Administered 2018-05-24: 80 mL via INTRAVENOUS

## 2018-05-24 MED ORDER — ONDANSETRON HCL 4 MG/2ML IJ SOLN
4.0000 mg | Freq: Once | INTRAMUSCULAR | Status: AC
  Administered 2018-05-24: 4 mg via INTRAVENOUS
  Filled 2018-05-24: qty 2

## 2018-05-24 MED ORDER — CEPHALEXIN 500 MG PO CAPS: 500.0000 mg | ORAL_CAPSULE | Freq: Four times a day (QID) | ORAL | 0 refills | Status: DC

## 2018-05-24 NOTE — ED Notes (Signed)
[name], Tracy Rodriguez, would like an update when possible of what is going on. Her # is [phone]

## 2018-05-24 NOTE — ED Provider Notes (Signed)
[facility] EMERGENCY DEPARTMENT Provider Note   CSN: [id number] Arrival date & time: 05/24/18  0847    History   Chief Complaint Chief Complaint  Patient presents with   Fall    HPI Tracy Rodriguez is a 85 y.o. female.     The history is provided by the patient and the EMS personnel.  Fall  This is a new problem. The current episode started 6 to 12 hours ago. The problem occurs constantly. The problem has not changed since onset.Associated symptoms include abdominal pain and headaches. Associated symptoms comments: Patient states for the last 1 week she has had severe nausea and diarrhea.  She fell last night and laid on the floor all night and they found her this morning.  She states she has some lower abdominal pain but denies cough or shortness of breath.  She cannot remember the last time she took her medications.  She denies any neck pain, leg pain but is complaining of a mild headache.  She also admits to being on anticoagulation.  She is not sure if anybody in her assisted living facility has had vomiting or diarrhea.  She is not aware if she has had a fever.  EMS report that facility found her this morning on the floor and she most likely laid there all night.. Nothing aggravates the symptoms. Nothing relieves the symptoms. She has tried nothing for the symptoms.    Past Medical History:  Diagnosis Date   Arthritis    "joints" (08/04/2014)   Bipolar 1 disorder ([place])    Cancer of right breast (HCC)    CHF (congestive heart failure) (HCC)    Chronic lower back pain    Hyperlipemia    Hypertension    PAF (paroxysmal atrial fibrillation) ([place])     Patient Active Problem List   Diagnosis Date Noted   Functional dyspnea (2/2 posture/positioning w/ abnormal sprirometry) 10/03/2016   Bipolar 1 disorder ([place]) 10/03/2016   PAF (paroxysmal atrial fibrillation) ([place]) 10/03/2016   Hypertension 10/03/2016   Orthostatic syncope 10/03/2016    Diarrhea 10/03/2016   Chronic diastolic heart failure ([place]) 10/03/2016   Supratherapeutic INR 10/03/2016   Dementia ([place]) 10/03/2016   Hyperlipemia 10/03/2016   CKD (chronic kidney disease) stage 3, GFR 30-59 ml/min (HCC) 10/03/2016   Symptomatic bradycardia    Dyspnea 09/16/2016   CHF (congestive heart failure) ([place]) 03/04/2016   Acute renal failure superimposed on stage 3 chronic kidney disease ([place]) 02/08/2016   Fall    Syncope 12/05/2015   Physical deconditioning 12/05/2015   Encounter for therapeutic drug monitoring 12/22/2014   Atrial fibrillation with RVR ([place]) 08/04/2014   Paroxysmal atrial fibrillation ([place]) 08/04/2014   Hypertensive urgency    Obesity 09/27/2013   Chronic back pain 09/14/2012   Bipolar disorder ([place]) 09/14/2012    Past Surgical History:  Procedure Laterality Date   AUGMENTATION MAMMAPLASTY Bilateral    BACK SURGERY     BREAST BIOPSY Right    BREAST RECONSTRUCTION Bilateral    CATARACT EXTRACTION W/ INTRAOCULAR LENS  IMPLANT, BILATERAL Bilateral    EXCISIONAL HEMORRHOIDECTOMY     LUMBAR [place]; 1973; 1985   "ruptured discs each time"   MASTECTOMY Right    cancer   PLACEMENT OF BREAST IMPLANTS Bilateral    "had to take tissue out of left"   TONSILLECTOMY     VAGINAL HYSTERECTOMY       OB History   No obstetric history on file.  Home Medications    Prior to Admission medications   Medication Sig Start Date End Date Taking? Authorizing Provider  acetaminophen (TYLENOL) 500 MG tablet Take 500 mg by mouth every 4 (four) hours as needed for mild pain or headache.    [provider]  albuterol (PROVENTIL) (2.5 MG/3ML) 0.083% nebulizer solution Take 3 mLs (2.5 mg total) by nebulization every 6 (six) hours as needed for wheezing or shortness of breath. Patient not taking: Reported on 01/17/2017 09/16/16   Magdalen Spatz, NP  alum & mag hydroxide-simeth (MAALOX/MYLANTA) 200-200-20 MG/5ML  suspension Take 30 mLs by mouth as needed for indigestion.     [provider]  atorvastatin (LIPITOR) 40 MG tablet Take 40 mg by mouth daily.    [provider]  carboxymethylcellul-glycerin (OPTIVE) 0.5-0.9 % ophthalmic solution Place 1 drop into both eyes 4 (four) times daily.    [provider]  carvedilol (COREG) 3.125 MG tablet Take 1 tablet (3.125 mg total) by mouth 2 (two) times daily with a meal. PLEASE START THE LOWER DOSE ON Monday, 7/30 DEPENDING ON HER HEART RATE. OK TO START IF HEART RATE IS NOT LESS THAN 65. 10/07/16   Bonnielee Haff, MD  cephALEXin (KEFLEX) 500 MG capsule Take 1 capsule (500 mg total) by mouth 4 (four) times daily. Patient not taking: Reported on 12/10/2017 12/09/17   Horton, Barbette Hair, MD  Cranberry-Vitamin C-Probiotic (AZO CRANBERRY) 250-30 MG TABS Take 1 tablet by mouth daily.    [provider]  digoxin (LANOXIN) 0.125 MG tablet Take 0.0625 mg by mouth daily. Hold if  pulse is less than 60    [provider]  diltiazem (CARDIZEM CD) 120 MG 24 hr capsule Take 120 mg daily by mouth.    [provider]  escitalopram (LEXAPRO) 10 MG tablet TAKE 1 TABLET(10 MG) BY MOUTH DAILY 09/29/17   Marletta Lor, MD  gabapentin (NEURONTIN) 100 MG capsule Take 100 mg by mouth at bedtime.    [provider]  guaifenesin (ROBITUSSIN) 100 MG/5ML syrup Take 200 mg by mouth every 6 (six) hours as needed for cough.    [provider]  loperamide (IMODIUM) 2 MG capsule Take 2 mg See admin instructions by mouth. Standing order: Take 1 capsule  By mouth each  Loose stool as needed for Diarrhea (Do not Exceed 8  Doses in 24hrs    [provider]  magnesium hydroxide (MILK OF MAGNESIA) 400 MG/5ML suspension Take 30 mLs by mouth daily as needed for mild constipation.    [provider]  Melatonin 3 MG TABS Take 3 mg by mouth at bedtime.    [provider]  Neomycin-Bacitracin-Polymyxin ([employer]  TRIPLE ANTIBIOTIC) [phone] OINT Apply 1 application topically as needed. For skin tear. Abrasions* Clean area with normal saline, apply TAO, cover with bandaid or Gauze and tape. Change as needed until healed.    [provider]  oxymetazoline (AFRIN) 0.05 % nasal spray Place 1 spray into both nostrils 2 (two) times daily as needed (congestion, nose bleed.).     [provider]  polyethylene glycol powder (GLYCOLAX/MIRALAX) powder Take 17 g by mouth daily as needed for moderate constipation. Patient not taking: Reported on 12/11/2017 10/05/16   Bonnielee Haff, MD  Psyllium [facility]) WAFR Take 2 Wafers by mouth daily. Prn constipation Patient not taking: Reported on 12/10/2017 03/25/16   Briscoe Deutscher, DO  QUEtiapine (SEROQUEL) 25 MG tablet Take 1 tablet (25 mg total) by mouth at bedtime. 10/05/16  Bonnielee Haff, MD  Rivaroxaban (XARELTO) 15 MG TABS tablet Take 15 mg by mouth daily.    [provider]  sennosides-docusate sodium (SENOKOT-S) 8.6-50 MG tablet Take 1 tablet by mouth daily. Patient taking differently: Take 1 tablet by mouth 2 (two) times daily.  05/28/16   Marletta Lor, MD  sodium chloride (OCEAN) 0.65 % SOLN nasal spray Place 2 sprays into both nostrils daily.    [provider]  traMADol (ULTRAM) 50 MG tablet Take 1 tablet (50 mg total) by mouth See admin instructions. Take 50 mg by mouth three times a day at 8:00 am, 14:00 and 22:00 Also take 50 mg by mouth every four hours as needed for sever pain not to exceed total 4/day 10/05/16   Bonnielee Haff, MD  traZODone (DESYREL) 150 MG tablet Take 1 tablet (150 mg total) by mouth at bedtime. 10/05/16   Bonnielee Haff, MD  warfarin (COUMADIN) 1 MG tablet Take 1-4 tablets (1-4 mg total) by mouth daily. Take 2mg  daily starting 7/28. Check PT/INR on 7/30 and then dose accordingly. Patient not taking: Reported on 12/10/2017 10/05/16   Bonnielee Haff, MD    Family History Family History    Problem Relation Age of Onset   Stroke Mother    Heart disease Father    Alzheimer's disease Sister    Leukemia Brother    Arthritis Maternal Grandmother    Breast cancer Daughter     Social History Social History   Tobacco Use   Smoking status: Never Smoker   Smokeless tobacco: Never Used  Substance Use Topics   Alcohol use: No   Drug use: No     Allergies   Codeine; Morphine and related; Valium [diazepam]; Sulfa antibiotics; Darvon [propoxyphene]; Floxin [ofloxacin]; Levsin [hyoscyamine sulfate]; Penicillins; and Zantac [ranitidine hcl]   Review of Systems Review of Systems  Gastrointestinal: Positive for abdominal pain.  Neurological: Positive for headaches.  All other systems reviewed and are negative.    Physical Exam Updated Vital Signs BP (!) 168/95    Pulse 82    Temp 98.5 F (36.9 C) (Oral)    Resp 20    Ht 5' 6.5" (1.689 m)    Wt 74.4 kg    LMP 10/10/1969    SpO2 97%    BMI 26.08 kg/m   Physical Exam Vitals signs and nursing note reviewed.  Constitutional:      General: She is not in acute distress.    Appearance: She is well-developed.  HENT:     Head: Normocephalic and atraumatic.     Mouth/Throat:     Mouth: Mucous membranes are dry.  Eyes:     Pupils: Pupils are equal, round, and reactive to light.  Neck:     Musculoskeletal: Normal range of motion and neck supple. No muscular tenderness.  Cardiovascular:     Rate and Rhythm: Normal rate. Rhythm irregularly irregular.     Heart sounds: Normal heart sounds. No murmur. No friction rub.  Pulmonary:     Effort: Pulmonary effort is normal.     Breath sounds: Normal breath sounds. No wheezing or rales.  Abdominal:     General: Bowel sounds are normal. There is no distension.     Palpations: Abdomen is soft.     Tenderness: There is abdominal tenderness in the suprapubic area. There is no guarding or rebound.  Musculoskeletal: Normal range of motion.        General: No tenderness.      Right lower  leg: Edema present.     Left lower leg: Edema present.     Comments: 1+ pitting edema bilateral lower extremities.  Patient is able to fully range both lower extremities without any discomfort in bilateral knees or hips  Skin:    General: Skin is warm and dry.     Findings: No rash.  Neurological:     Mental Status: She is alert.     Cranial Nerves: No cranial nerve deficit.     Sensory: No sensory deficit.     Motor: No weakness.     Comments: Oriented to person but patient appears slightly confused and delayed response to questions  Psychiatric:     Comments: Cooperative      ED Treatments / Results  Labs (all labs ordered are listed, but only abnormal results are displayed) Labs Reviewed  COMPREHENSIVE METABOLIC PANEL - Abnormal; Notable for the following components:      Result Value   Sodium 133 (*)    Potassium 3.3 (*)    Glucose, Bld 120 (*)    Creatinine, Ser 1.11 (*)    GFR calc non Af Amer 45 (*)    GFR calc Af Amer 52 (*)    All other components within normal limits  CK - Abnormal; Notable for the following components:   Total CK 560 (*)    All other components within normal limits  DIGOXIN LEVEL - Abnormal; Notable for the following components:   Digoxin Level <0.2 (*)    All other components within normal limits  URINALYSIS, ROUTINE W REFLEX MICROSCOPIC - Abnormal; Notable for the following components:   APPearance HAZY (*)    Hgb urine dipstick SMALL (*)    Nitrite POSITIVE (*)    Leukocytes,Ua LARGE (*)    Bacteria, UA MANY (*)    All other components within normal limits  GASTROINTESTINAL PANEL BY PCR, STOOL (REPLACES STOOL CULTURE)  URINE CULTURE  CBC WITH DIFFERENTIAL/PLATELET  LIPASE, BLOOD  LACTIC ACID, PLASMA  I-STAT TROPONIN, ED    EKG EKG Interpretation  Date/Time:  Sunday May 24 2018 08:49:51 EDT Ventricular Rate:  89 PR Interval:    QRS Duration: 91 QT Interval:  364 QTC Calculation: 396 R Axis:   -19 Text  Interpretation:  recurrent Atrial fibrillation Paired ventricular premature complexes Borderline left axis deviation Nonspecific repol abnormality, diffuse leads Baseline wander in lead(s) V5 V6 No significant change since last tracing Confirmed by Blanchie Dessert [phone]) on 05/24/2018 10:38:53 AM   Radiology Dg Chest 2 View  Result Date: 05/24/2018 CLINICAL DATA:  Nausea, right breast cancer EXAM: CHEST - 2 VIEW COMPARISON:  Chest radiographs dated 10/03/2016 FINDINGS: 3.5 cm right upper lung mass, new. Left lung is clear. No pleural effusion or pneumothorax. Heart is normal in size. Mild degenerative changes of the visualized thoracolumbar spine. IMPRESSION: 3.5 cm right upper lung mass, new, suspicious for metastasis. Consider CT chest with contrast for further evaluation. Electronically Signed   By: Julian Hy M.D.   On: 05/24/2018 10:25   Ct Head Wo Contrast  Result Date: 05/24/2018 CLINICAL DATA:  Unwitnessed fall.  Found on the floor. EXAM: CT HEAD WITHOUT CONTRAST TECHNIQUE: Contiguous axial images were obtained from the base of the skull through the vertex without intravenous contrast. COMPARISON:  12/11/2017 FINDINGS: Brain: Generalized atrophy. Chronic small-vessel ischemic changes of the hemispheric white matter. No sign of acute infarction, mass lesion, hemorrhage, hydrocephalus or extra-axial collection. Vascular: There is atherosclerotic calcification of the major vessels at the  base of the brain. Skull: Negative Sinuses/Orbits: Clear/normal Other: None IMPRESSION: No acute or traumatic finding. Age related atrophy with chronic small-vessel ischemic changes of the white matter. Electronically Signed   By: Nelson Chimes M.D.   On: 05/24/2018 10:24   Ct Chest W Contrast  Result Date: 05/24/2018 CLINICAL DATA:  85 year old female with new LEFT lung nodular opacity on recent chest radiograph. History of breast cancer. EXAM: CT CHEST, ABDOMEN, AND PELVIS WITH CONTRAST TECHNIQUE:  Multidetector CT imaging of the chest, abdomen and pelvis was performed following the standard protocol during bolus administration of intravenous contrast. CONTRAST:  40mL OMNIPAQUE IOHEXOL 300 MG/ML  SOLN COMPARISON:  05/24/2018 chest radiograph, 04/05/2016 chest CT, 08/13/2015 abdominal/pelvic CT and other studies FINDINGS: CT CHEST FINDINGS Cardiovascular: Cardiomegaly again noted. Aortic atherosclerotic calcification noted without aneurysm or pericardial effusion. Mediastinum/Nodes: No enlarged mediastinal, hilar, or axillary lymph nodes. Thyroid gland, trachea, and esophagus demonstrate no significant findings. Lungs/Pleura: Focal fluid within the RIGHT major fissure represents the nodular opacity identified on recent chest radiograph. Scattered areas of scarring within both lungs noted. No new pulmonary opacities, suspicious nodule, mass, airspace disease or consolidation noted. No pneumothorax or LEFT pleural effusion. Musculoskeletal: No acute or suspicious bony abnormalities. Bilateral breast implants noted. CT ABDOMEN PELVIS FINDINGS Hepatobiliary: The liver and gallbladder are unremarkable. CBD prominence is unchanged. Pancreas: Unremarkable Spleen: Unremarkable Adrenals/Urinary Tract: Bilateral renal atrophy and cortical thinning again noted. The adrenal glands and bladder are unremarkable. Stomach/Bowel: Stomach is within normal limits. No evidence of bowel wall thickening, distention, or inflammatory changes. Vascular/Lymphatic: Aortic atherosclerosis. No enlarged abdominal or pelvic lymph nodes. Reproductive: Status post hysterectomy. No adnexal masses. Other: No ascites, pneumoperitoneum or abscess. Musculoskeletal: No acute or suspicious bony abnormalities. IMPRESSION: 1. Focal pleural fluid within the RIGHT major fissure accounting for the patient's recent chest radiograph finding. No imaging follow-up recommended. 2. No evidence of other acute abnormality or metastatic disease. 3. Cardiomegaly  and scattered areas of pulmonary scarring. 4.  Aortic Atherosclerosis (ICD10-I70.0). Electronically Signed   By: Margarette Canada M.D.   On: 05/24/2018 12:49   Ct Abdomen Pelvis W Contrast  Result Date: 05/24/2018 CLINICAL DATA:  85 year old female with new LEFT lung nodular opacity on recent chest radiograph. History of breast cancer. EXAM: CT CHEST, ABDOMEN, AND PELVIS WITH CONTRAST TECHNIQUE: Multidetector CT imaging of the chest, abdomen and pelvis was performed following the standard protocol during bolus administration of intravenous contrast. CONTRAST:  65mL OMNIPAQUE IOHEXOL 300 MG/ML  SOLN COMPARISON:  05/24/2018 chest radiograph, 04/05/2016 chest CT, 08/13/2015 abdominal/pelvic CT and other studies FINDINGS: CT CHEST FINDINGS Cardiovascular: Cardiomegaly again noted. Aortic atherosclerotic calcification noted without aneurysm or pericardial effusion. Mediastinum/Nodes: No enlarged mediastinal, hilar, or axillary lymph nodes. Thyroid gland, trachea, and esophagus demonstrate no significant findings. Lungs/Pleura: Focal fluid within the RIGHT major fissure represents the nodular opacity identified on recent chest radiograph. Scattered areas of scarring within both lungs noted. No new pulmonary opacities, suspicious nodule, mass, airspace disease or consolidation noted. No pneumothorax or LEFT pleural effusion. Musculoskeletal: No acute or suspicious bony abnormalities. Bilateral breast implants noted. CT ABDOMEN PELVIS FINDINGS Hepatobiliary: The liver and gallbladder are unremarkable. CBD prominence is unchanged. Pancreas: Unremarkable Spleen: Unremarkable Adrenals/Urinary Tract: Bilateral renal atrophy and cortical thinning again noted. The adrenal glands and bladder are unremarkable. Stomach/Bowel: Stomach is within normal limits. No evidence of bowel wall thickening, distention, or inflammatory changes. Vascular/Lymphatic: Aortic atherosclerosis. No enlarged abdominal or pelvic lymph nodes. Reproductive:  Status post hysterectomy. No adnexal masses. Other: No  ascites, pneumoperitoneum or abscess. Musculoskeletal: No acute or suspicious bony abnormalities. IMPRESSION: 1. Focal pleural fluid within the RIGHT major fissure accounting for the patient's recent chest radiograph finding. No imaging follow-up recommended. 2. No evidence of other acute abnormality or metastatic disease. 3. Cardiomegaly and scattered areas of pulmonary scarring. 4.  Aortic Atherosclerosis (ICD10-I70.0). Electronically Signed   By: Margarette Canada M.D.   On: 05/24/2018 12:49    Procedures Procedures (including critical care time)  Medications Ordered in ED Medications  sodium chloride 0.9 % bolus 500 mL (has no administration in time range)  ondansetron (ZOFRAN) injection 4 mg (has no administration in time range)     Initial Impression / Assessment and Plan / ED Course  I have reviewed the triage vital signs and the nursing notes.  Pertinent labs & imaging results that were available during my care of the patient were reviewed by me and considered in my medical decision making (see chart for details).       Elderly female with multiple medical problems including CHF, paroxysmal atrial fibrillation on Xarelto, breast cancer hypertension presenting with EMS after being found down in her room.  Patient states she was there all night.  She cannot recall what caused her to fall but she has had diarrhea for the last 1 week with nausea and poor p.o. intake.  Patient has some mild suprapubic pain on exam but is able to fully range both hips and low suspicion for fracture.  She appears dehydrated but does have chronic edema in her lower extremities.  Patient's last echo was in 2018 at time her EF was 65 to 70%.  Patient has no evidence of bruising or injury to her body from the fall.  Concern for possible infectious diarrhea.  She is not a good enough historian to know if she has high risk for C. Difficile.  Labs, urine, imaging of the  head given she is on anticoagulation and had a fall pending.  Patient given IV fluids.  10:42 AM EKG without acute findings, labs are reassuring with stable renal function, hemoglobin and normal white blood cell count.  CK mildly elevated at 500.  UA and lactate are still pending.  Chest x-ray with a 3.5 cm right upper lung mass which is new and suspicious for metastasis.  Radiology recommends a CT of the chest with contrast however given patient's diarrhea and feeling poorly over the last week we will also scan the abdomen and pelvis and ensuring there is no new mass that would be causing the symptoms.  1:12 PM CT is negative for acute findings.  Discussed results with the patient's daughter Eustaquio Maize.  She has been on lockdown in her facility due to the coronavirus for the last 10 days and she has not been able to check on her.  However she states diarrhea is not necessarily uncommon for her to have.  She states that sometimes she will get urinary tract infections which will has declined like this.  Still waiting on urine results but patient is otherwise well-appearing.  She is in assisted living but it is a small facility that is watch closely and they check on her every few hours and feel that she will be safe for return.  1:45 PM UA is concerning for a UTI.  Last culture in October was positive for E. coli that was pansensitive to everything except ampicillin.  Patient will be covered with Keflex.  She is requesting something to drink and is otherwise well-appearing.  Will discharge back to facility.   Final Clinical Impressions(s) / ED Diagnoses   Final diagnoses:  Fall, initial encounter  Acute cystitis without hematuria    ED Discharge Orders         Ordered    cephALEXin (KEFLEX) 500 MG capsule  4 times daily     05/24/18 1347           Blanchie Dessert, MD 05/24/18 1347

## 2018-05-24 NOTE — ED Notes (Signed)
Pt given discharge instructions.

## 2018-05-24 NOTE — ED Notes (Signed)
Daughter, Eustaquio Maize, would like an update when possible of what is going on. Her # is [phone]

## 2018-05-24 NOTE — ED Notes (Signed)
Patient transported to CT 

## 2018-05-24 NOTE — ED Triage Notes (Signed)
Pt BIB [facility] from [facility]. Pt had an unwitnessed fall and was found on the floor this morning by staff. Pt reports that she fell last night and has been on the floor since then. Per EMS pt has been having diarrhea that started yesterday and complaining of some nausea. Pt complaining of some lower back pain and a headache for EMS.

## 2018-05-24 NOTE — Discharge Instructions (Signed)
Return to the emergency room if you have fever, vomiting and unable to hold anything down, worsening weakness and recurrent falls.

## 2018-05-24 NOTE — ED Notes (Signed)
Two RN's attempted without success to obtain IV access. Unable to obtain IV access at this time.

## 2018-05-24 NOTE — ED Notes (Signed)
Pt daughter requested pt be test for UTI, stated that usually when pt falls it's because she has UTI

## 2018-05-27 LAB — URINE CULTURE: Culture: >=100000 — AB

## 2018-05-28 ENCOUNTER — Telehealth: Payer: Self-pay | Admitting: *Deleted

## 2018-05-28 NOTE — Progress Notes (Signed)
ED Antimicrobial Stewardship Positive Culture Follow Up   Tracy Rodriguez is an 85 y.o. female who presented to [facility] on 05/24/2018 with a chief complaint of  Chief Complaint  Patient presents with  . Fall    Recent Results (from the past 34 hour(s))  Urine Culture     Status: Abnormal   Collection Time: 05/24/18  1:46 PM  Result Value Ref Range Status   Specimen Description URINE, CLEAN CATCH  Final   Special Requests   Final    NONE Performed at [facility], [address] [place], [place] [address]    Culture (A)  Final    >=100,000 COLONIES/mL CITROBACTER FREUNDII 30,000 COLONIES/mL PROTEUS MIRABILIS    Report Status 05/27/2018 FINAL  Final   Organism ID, Bacteria CITROBACTER FREUNDII (A)  Final   Organism ID, Bacteria PROTEUS MIRABILIS (A)  Final      Susceptibility   Citrobacter freundii - MIC*    CEFAZOLIN >=64 RESISTANT Resistant     CEFTRIAXONE <=1 SENSITIVE Sensitive     CIPROFLOXACIN <=0.25 SENSITIVE Sensitive     GENTAMICIN <=1 SENSITIVE Sensitive     IMIPENEM 0.5 SENSITIVE Sensitive     NITROFURANTOIN 32 SENSITIVE Sensitive     TRIMETH/SULFA <=20 SENSITIVE Sensitive     PIP/TAZO <=4 SENSITIVE Sensitive     * >=100,000 COLONIES/mL CITROBACTER FREUNDII   Proteus mirabilis - MIC*    AMPICILLIN <=2 SENSITIVE Sensitive     CEFAZOLIN <=4 SENSITIVE Sensitive     CEFTRIAXONE <=1 SENSITIVE Sensitive     CIPROFLOXACIN <=0.25 SENSITIVE Sensitive     GENTAMICIN <=1 SENSITIVE Sensitive     IMIPENEM 8 INTERMEDIATE Intermediate     NITROFURANTOIN 128 RESISTANT Resistant     TRIMETH/SULFA <=20 SENSITIVE Sensitive     AMPICILLIN/SULBACTAM <=2 SENSITIVE Sensitive     PIP/TAZO <=4 SENSITIVE Sensitive     * 30,000 COLONIES/mL PROTEUS MIRABILIS   Call for symptom check If improved, then no further treatment If patient has UTI symptoms, stop cephalexin, and start cefpodoxime 200 mg po bid x 10 days.  ED Provider: Martinique Robinson, PA   Corinda Gubler 05/28/2018, 9:51 AM Clinical Pharmacist Monday - Friday phone -  [phone] Saturday - Sunday phone - [phone]

## 2018-05-28 NOTE — Telephone Encounter (Signed)
Post ED Visit - Positive Culture Follow-up  Culture report reviewed by antimicrobial stewardship pharmacist: [place] Team []  Elenor Quinones, Pharm.D. []  Heide Guile, Pharm.D., BCPS AQ-ID []  Parks Neptune, Pharm.D., BCPS []  Alycia Rossetti, Pharm.D., BCPS []  [place], Pharm.D., BCPS, AAHIVP []  Legrand Como, Pharm.D., BCPS, AAHIVP []  Salome Arnt, PharmD, BCPS []  Johnnette Gourd, PharmD, BCPS []  Hughes Better, PharmD, BCPS []  Leeroy Cha, PharmD []  Laqueta Linden, PharmD, BCPS []  Albertina Parr, PharmD  [place] Team []  Leodis Sias, PharmD []  Lindell Spar, PharmD []  Royetta Asal, PharmD []  Graylin Shiver, Rph []  Rema Fendt) Glennon Mac, PharmD []  Arlyn Dunning, PharmD []  Netta Cedars, PharmD []  Dia Sitter, PharmD []  Leone Haven, PharmD []  Gretta Arab, PharmD []  Theodis Shove, PharmD []  Peggyann Juba, PharmD []  Reuel Boom, PharmD   Positive urine culture, reviewed by [place] Robinson, PA-C Treated with Cephalexin, organism sensitive to the same and no further patient follow-up is required at this time.  Harlon Flor [facility], [place] 05/28/2018, 11:52 AM

## 2019-06-06 ENCOUNTER — Emergency Department (HOSPITAL_COMMUNITY)
Admission: EM | Admit: 2019-06-06 | Discharge: 2019-06-06 | Disposition: A | Discharge status: Home / Self Care | Payer: Medicare Other | Attending: Emergency Medicine | Admitting: Emergency Medicine

## 2019-06-06 ENCOUNTER — Encounter (HOSPITAL_COMMUNITY): Payer: Self-pay | Admitting: Emergency Medicine

## 2019-06-06 DIAGNOSIS — I130 Hypertensive heart and chronic kidney disease with heart failure and stage 1 through stage 4 chronic kidney disease, or unspecified chronic kidney disease: Secondary | ICD-10-CM | POA: Insufficient documentation

## 2019-06-06 DIAGNOSIS — R040 Epistaxis: Secondary | ICD-10-CM | POA: Insufficient documentation

## 2019-06-06 DIAGNOSIS — Z79899 Other long term (current) drug therapy: Secondary | ICD-10-CM | POA: Insufficient documentation

## 2019-06-06 DIAGNOSIS — I5032 Chronic diastolic (congestive) heart failure: Secondary | ICD-10-CM | POA: Insufficient documentation

## 2019-06-06 DIAGNOSIS — Z7901 Long term (current) use of anticoagulants: Secondary | ICD-10-CM | POA: Insufficient documentation

## 2019-06-06 DIAGNOSIS — N183 Chronic kidney disease, stage 3 unspecified: Secondary | ICD-10-CM | POA: Insufficient documentation

## 2019-06-06 DIAGNOSIS — I4891 Unspecified atrial fibrillation: Secondary | ICD-10-CM | POA: Insufficient documentation

## 2019-06-06 DIAGNOSIS — F319 Bipolar disorder, unspecified: Secondary | ICD-10-CM | POA: Insufficient documentation

## 2019-06-06 NOTE — ED Triage Notes (Signed)
Pt present to the ED from [facility] by [facility] Complaints of nosebleed She is taking Xarelto On arrival pt's nosebleed appears controlled She is alert and oriented; asking for Tramadol  EDP at bedside

## 2019-06-06 NOTE — ED Notes (Signed)
Called ptar for pt transport  

## 2019-06-06 NOTE — ED Provider Notes (Signed)
[facility] EMERGENCY DEPARTMENT Provider Note   CSN: [id number] Arrival date & time: 06/06/19  1205     History Nosebleed  Tracy Rodriguez is a 86 y.o. female.  HPI   Patient presented to the ED for evaluation of a nosebleed she is a resident of [place] and EMS was called after she experienced a nosebleed today.  Patient is on Xarelto.  Patient denies any other complaints of weakness, chest pain or shortness of breath.  Before she arrived, the bleeding did stop.  Past Medical History:  Diagnosis Date  . Arthritis    "joints" (08/04/2014)  . Bipolar 1 disorder ([place])   . Cancer of right breast ([place])   . CHF (congestive heart failure) ([place])   . Chronic lower back pain   . Hyperlipemia   . Hypertension   . PAF (paroxysmal atrial fibrillation) [facility]     Patient Active Problem List   Diagnosis Date Noted  . Functional dyspnea (2/2 posture/positioning w/ abnormal sprirometry) 10/03/2016  . Bipolar 1 disorder ([place]) 10/03/2016  . PAF (paroxysmal atrial fibrillation) ([place]) 10/03/2016  . Hypertension 10/03/2016  . Orthostatic syncope 10/03/2016  . Diarrhea 10/03/2016  . Chronic diastolic heart failure ([place]) 10/03/2016  . Supratherapeutic INR 10/03/2016  . Dementia ([place]) 10/03/2016  . Hyperlipemia 10/03/2016  . CKD (chronic kidney disease) stage 3, GFR 30-59 ml/min 10/03/2016  . Symptomatic bradycardia   . Dyspnea 09/16/2016  . CHF (congestive heart failure) ([place]) 03/04/2016  . Acute renal failure superimposed on stage 3 chronic kidney disease ([place]) 02/08/2016  . Fall   . Syncope 12/05/2015  . Physical deconditioning 12/05/2015  . Encounter for therapeutic drug monitoring 12/22/2014  . Atrial fibrillation with RVR ([place]) 08/04/2014  . Paroxysmal atrial fibrillation ([place]) 08/04/2014  . Hypertensive urgency   . Obesity 09/27/2013  . Chronic back pain 09/14/2012  . Bipolar disorder ([place]) 09/14/2012    Past Surgical History:  Procedure Laterality  Date  . AUGMENTATION MAMMAPLASTY Bilateral   . BACK SURGERY    . BREAST BIOPSY Right   . BREAST RECONSTRUCTION Bilateral   . CATARACT EXTRACTION W/ INTRAOCULAR LENS  IMPLANT, BILATERAL Bilateral   . EXCISIONAL HEMORRHOIDECTOMY    . [place]; 1973; 1985   "ruptured discs each time"  . MASTECTOMY Right    cancer  . PLACEMENT OF BREAST IMPLANTS Bilateral    "had to take tissue out of left"  . TONSILLECTOMY    . VAGINAL HYSTERECTOMY       OB History   No obstetric history on file.     Family History  Problem Relation Age of Onset  . Stroke Mother   . Heart disease Father   . Alzheimer's disease Sister   . Leukemia Brother   . Arthritis Maternal Grandmother   . Breast cancer Daughter     Social History   Tobacco Use  . Smoking status: Never Smoker  . Smokeless tobacco: Never Used  Substance Use Topics  . Alcohol use: No  . Drug use: No    Home Medications Prior to Admission medications   Medication Sig Start Date End Date Taking? Authorizing Provider  acetaminophen (TYLENOL) 500 MG tablet Take 500 mg by mouth every 4 (four) hours as needed for mild pain or headache.    [provider]  albuterol (PROVENTIL) (2.5 MG/3ML) 0.083% nebulizer solution Take 3 mLs (2.5 mg total) by nebulization every 6 (six) hours as needed for wheezing or shortness of breath. Patient not  taking: Reported on 01/17/2017 09/16/16   Magdalen Spatz, NP  alum & mag hydroxide-simeth (MAALOX/MYLANTA) 200-200-20 MG/5ML suspension Take 30 mLs by mouth as needed for indigestion.     [provider]  atorvastatin (LIPITOR) 40 MG tablet Take 40 mg by mouth daily.    [provider]  carboxymethylcellul-glycerin (OPTIVE) 0.5-0.9 % ophthalmic solution Place 1 drop into both eyes 4 (four) times daily.    [provider]  carvedilol (COREG) 3.125 MG tablet Take 1 tablet (3.125 mg total) by mouth 2 (two) times daily with a meal. PLEASE START THE LOWER DOSE ON  Monday, 7/30 DEPENDING ON HER HEART RATE. OK TO START IF HEART RATE IS NOT LESS THAN 65. 10/07/16   Bonnielee Haff, MD  cephALEXin (KEFLEX) 500 MG capsule Take 1 capsule (500 mg total) by mouth 4 (four) times daily. 05/24/18   Blanchie Dessert, MD  Cranberry-Vitamin C-Probiotic (AZO CRANBERRY) 250-30 MG TABS Take 1 tablet by mouth daily.    [provider]  digoxin (LANOXIN) 0.125 MG tablet Take 0.0625 mg by mouth daily. Hold if  pulse is less than 60    [provider]  diltiazem (CARDIZEM CD) 120 MG 24 hr capsule Take 120 mg daily by mouth.    [provider]  escitalopram (LEXAPRO) 10 MG tablet TAKE 1 TABLET(10 MG) BY MOUTH DAILY Patient taking differently: Take 10 mg by mouth daily.  09/29/17   Marletta Lor, MD  furosemide (LASIX) 20 MG tablet Take 20 mg by mouth daily. 04/16/18   [provider]  gabapentin (NEURONTIN) 100 MG capsule Take 100 mg by mouth at bedtime.    [provider]  guaifenesin (ROBITUSSIN) 100 MG/5ML syrup Take 200 mg by mouth every 6 (six) hours as needed for cough.    [provider]  loperamide (IMODIUM) 2 MG capsule Take 2 mg See admin instructions by mouth. Standing order: Take 1 capsule  By mouth each  Loose stool as needed for Diarrhea (Do not Exceed 8  Doses in 24hrs    [provider]  magnesium hydroxide (MILK OF MAGNESIA) 400 MG/5ML suspension Take 30 mLs by mouth daily as needed for mild constipation.    [provider]  Melatonin 3 MG TABS Take 3 mg by mouth at bedtime.    [provider]  Neomycin-Bacitracin-Polymyxin ([employer] TRIPLE ANTIBIOTIC) [phone] OINT Apply 1 application topically as needed. For skin tear. Abrasions* Clean area with normal saline, apply TAO, cover with bandaid or Gauze and tape. Change as needed until healed.    [provider]  polyethylene glycol powder (GLYCOLAX/MIRALAX) powder Take 17 g by mouth daily as needed for moderate  constipation. Patient not taking: Reported on 12/11/2017 10/05/16   Bonnielee Haff, MD  QUEtiapine (SEROQUEL) 25 MG tablet Take 1 tablet (25 mg total) by mouth at bedtime. 10/05/16   Bonnielee Haff, MD  Rivaroxaban (XARELTO) 15 MG TABS tablet Take 15 mg by mouth daily.    [provider]  sennosides-docusate sodium (SENOKOT-S) 8.6-50 MG tablet Take 1 tablet by mouth daily. Patient taking differently: Take 1 tablet by mouth 2 (two) times daily.  05/28/16   Marletta Lor, MD  traMADol (ULTRAM) 50 MG tablet Take 1 tablet (50 mg total) by mouth See admin instructions. Take 50 mg by mouth three times a day at 8:00 am, 14:00 and 22:00 Also take 50 mg by mouth every four hours as needed for sever pain not to exceed total 4/day Patient taking differently:  Take 50 mg by mouth 3 (three) times daily.  10/05/16   Bonnielee Haff, MD  traMADol (ULTRAM) 50 MG tablet Take 50 mg by mouth 3 (three) times daily as needed for moderate pain or severe pain.    [provider]  traZODone (DESYREL) 150 MG tablet Take 1 tablet (150 mg total) by mouth at bedtime. 10/05/16   Bonnielee Haff, MD  warfarin (COUMADIN) 1 MG tablet Take 1-4 tablets (1-4 mg total) by mouth daily. Take 2mg  daily starting 7/28. Check PT/INR on 7/30 and then dose accordingly. Patient not taking: Reported on 12/10/2017 10/05/16   Bonnielee Haff, MD    Allergies    Codeine, Morphine and related, Valium [diazepam], Sulfa antibiotics, Darvon [propoxyphene], Floxin [ofloxacin], Levsin [hyoscyamine sulfate], Penicillins, and Zantac [ranitidine hcl]  Review of Systems   Review of Systems  All other systems reviewed and are negative.   Physical Exam Updated Vital Signs BP (!) 163/73   Pulse 74   Temp 98.5 F (36.9 C) (Oral)   Resp 20   Ht 1.689 m (5' 6.5")   Wt 74.5 kg   LMP 10/10/1969   SpO2 98%   BMI 26.11 kg/m   Physical Exam Vitals and nursing note reviewed.  Constitutional:      General: She is not in acute  distress.    Appearance: She is well-developed.  HENT:     Head: Normocephalic and atraumatic.     Right Ear: External ear normal.     Left Ear: External ear normal.     Nose:     Comments: Dried blood noted around the right nares, no active bleeding, small area in the nasal septum anteriorly on the right side that appears to be a small vessel with a clot    Mouth/Throat:     Comments: No blood noted in the oropharynx Eyes:     General: No scleral icterus.       Right eye: No discharge.        Left eye: No discharge.     Conjunctiva/sclera: Conjunctivae normal.  Neck:     Trachea: No tracheal deviation.  Cardiovascular:     Rate and Rhythm: Normal rate.  Pulmonary:     Effort: Pulmonary effort is normal. No respiratory distress.     Breath sounds: No stridor.  Abdominal:     General: There is no distension.  Musculoskeletal:        General: No swelling or deformity.     Cervical back: Neck supple.  Skin:    General: Skin is warm and dry.     Findings: No rash.  Neurological:     Mental Status: She is alert.     Cranial Nerves: Cranial nerve deficit: no gross deficits.     ED Results / Procedures / Treatments   Labs (all labs ordered are listed, but only abnormal results are displayed) Labs Reviewed - No data to display  EKG None  Radiology No results found.  Procedures .Epistaxis Management  Date/Time: 06/06/2019 12:53 PM Performed by: Dorie Rank, MD Authorized by: Dorie Rank, MD   Consent:    Consent obtained:  Verbal   Consent given by:  Patient   Risks discussed:  Bleeding   Alternatives discussed:  No treatment Anesthesia (see MAR for exact dosages):    Anesthesia method:  Topical application   Topical anesthesia: Lidocaine. Procedure details:    Treatment site:  R anterior   Treatment method:  Silver nitrate and merocel sponge  Treatment complexity:  Limited   Treatment episode: initial   Post-procedure details:    Patient tolerance of  procedure:  Tolerated well, no immediate complications Comments:     Pt started bleeding after initial cautery.  Merocel sponge placed.  Pt tolerated well.   (including critical care time)  Medications Ordered in ED Medications - No data to display  ED Course  I have reviewed the triage vital signs and the nursing notes.  Pertinent labs & imaging results that were available during my care of the patient were reviewed by me and considered in my medical decision making (see chart for details).  Clinical Course as of Jun 05 1413  Sun Jun 06, 2019  1332 Pt started to have a small amount of bleeding.  WIll place mercel anterior pack.   [JK]    Clinical Course User Index [JK] Dorie Rank, MD   MDM Rules/Calculators/A&P                      Patient presented with nasal bleeding.  Source located on exam.  Symptoms resolved after treatment with silver nitrate and anterior Merocel sponge pack.  Plan on discharge home.  Outpatient follow-up with ENT.  Warning signs and precautions discussed. Final Clinical Impression(s) / ED Diagnoses Final diagnoses:  Epistaxis    Rx / DC Orders ED Discharge Orders    None       Dorie Rank, MD 06/06/19 1415

## 2019-06-06 NOTE — Discharge Instructions (Signed)
Keep the packing in place for the next few days.  MAke an appointment to see Dr Constance Holster to have the packing removed.  Apply pressure and pinch the front of your nose if the bleeding restarts

## 2019-06-06 NOTE — ED Notes (Signed)
-  [facility] arrived at bedside to  transport patient -Called reports to [place] is receiving patient -

## 2019-06-12 ENCOUNTER — Emergency Department (HOSPITAL_COMMUNITY)
Admission: EM | Admit: 2019-06-12 | Discharge: 2019-06-13 | Disposition: A | Discharge status: Home / Self Care | Payer: Medicare Other | Source: Home / Self Care | Attending: Emergency Medicine | Admitting: Emergency Medicine

## 2019-06-12 ENCOUNTER — Encounter (HOSPITAL_COMMUNITY): Payer: Self-pay

## 2019-06-12 ENCOUNTER — Other Ambulatory Visit: Payer: Self-pay

## 2019-06-12 DIAGNOSIS — Z853 Personal history of malignant neoplasm of breast: Secondary | ICD-10-CM | POA: Insufficient documentation

## 2019-06-12 DIAGNOSIS — Z7901 Long term (current) use of anticoagulants: Secondary | ICD-10-CM

## 2019-06-12 DIAGNOSIS — R040 Epistaxis: Secondary | ICD-10-CM | POA: Insufficient documentation

## 2019-06-12 DIAGNOSIS — M79672 Pain in left foot: Secondary | ICD-10-CM | POA: Diagnosis not present

## 2019-06-12 DIAGNOSIS — N183 Chronic kidney disease, stage 3 unspecified: Secondary | ICD-10-CM | POA: Insufficient documentation

## 2019-06-12 DIAGNOSIS — I130 Hypertensive heart and chronic kidney disease with heart failure and stage 1 through stage 4 chronic kidney disease, or unspecified chronic kidney disease: Secondary | ICD-10-CM | POA: Insufficient documentation

## 2019-06-12 DIAGNOSIS — F0390 Unspecified dementia without behavioral disturbance: Secondary | ICD-10-CM | POA: Insufficient documentation

## 2019-06-12 DIAGNOSIS — I480 Paroxysmal atrial fibrillation: Secondary | ICD-10-CM | POA: Diagnosis not present

## 2019-06-12 DIAGNOSIS — Z79899 Other long term (current) drug therapy: Secondary | ICD-10-CM | POA: Insufficient documentation

## 2019-06-12 DIAGNOSIS — I5032 Chronic diastolic (congestive) heart failure: Secondary | ICD-10-CM | POA: Insufficient documentation

## 2019-06-12 LAB — BASIC METABOLIC PANEL
Anion gap: 11 (ref 5–15)
BUN: 20 mg/dL (ref 8–23)
CO2: 26 mmol/L (ref 22–32)
Calcium: 9 mg/dL (ref 8.9–10.3)
Chloride: 102 mmol/L (ref 98–111)
Creatinine, Ser: 1.17 mg/dL — ABNORMAL HIGH (ref 0.44–1.00)
GFR calc Af Amer: 49 mL/min — ABNORMAL LOW (ref >60)
GFR calc non Af Amer: 42 mL/min — ABNORMAL LOW (ref >60)
Glucose, Bld: 90 mg/dL (ref 70–99)
Potassium: 4.4 mmol/L (ref 3.5–5.1)
Sodium: 139 mmol/L (ref 135–145)

## 2019-06-12 LAB — CBC WITH DIFFERENTIAL/PLATELET
Abs Immature Granulocytes: 0.05 10*3/uL (ref 0.00–0.07)
Basophils Absolute: 0 10*3/uL (ref 0.0–0.1)
Basophils Relative: 0 %
Eosinophils Absolute: 0.1 10*3/uL (ref 0.0–0.5)
Eosinophils Relative: 1 %
HCT: 39.3 % (ref 36.0–46.0)
Hemoglobin: 12.4 g/dL (ref 12.0–15.0)
Immature Granulocytes: 1 %
Lymphocytes Relative: 18 %
Lymphs Abs: 1.9 10*3/uL (ref 0.7–4.0)
MCH: 31.1 pg (ref 26.0–34.0)
MCHC: 31.6 g/dL (ref 30.0–36.0)
MCV: 98.5 fL (ref 80.0–100.0)
Monocytes Absolute: 1 10*3/uL (ref 0.1–1.0)
Monocytes Relative: 10 %
Neutro Abs: 7.3 10*3/uL (ref 1.7–7.7)
Neutrophils Relative %: 70 %
Platelets: 236 10*3/uL (ref 150–400)
RBC: 3.99 MIL/uL (ref 3.87–5.11)
RDW: 13.2 % (ref 11.5–15.5)
WBC: 10.4 10*3/uL (ref 4.0–10.5)
nRBC: 0 % (ref 0.0–0.2)

## 2019-06-12 MED ORDER — LIDOCAINE-EPINEPHRINE-TETRACAINE (LET) TOPICAL GEL
3.0000 mL | Freq: Once | TOPICAL | Status: AC
  Administered 2019-06-13: 3 mL via TOPICAL
  Filled 2019-06-12: qty 3

## 2019-06-12 MED ORDER — OXYMETAZOLINE HCL 0.05 % NA SOLN
1.0000 | Freq: Once | NASAL | Status: AC
  Administered 2019-06-12: 1 via NASAL
  Filled 2019-06-12: qty 30

## 2019-06-12 NOTE — ED Triage Notes (Signed)
Patient comes from EMS from [facility] for epistaxis for 5 hours. On blood thinners. Came into hospital last week for same issue.

## 2019-06-12 NOTE — Discharge Instructions (Addendum)
Will need to follow up with ENT in 5 days for removal of nasal balloon.   Return for new or worsening symptoms.

## 2019-06-12 NOTE — ED Provider Notes (Signed)
[facility] EMERGENCY DEPARTMENT Provider Note   CSN: [id number] Arrival date & time: 06/12/19  2132   History Chief Complaint  Patient presents with  . Epistaxis   Tracy Rodriguez is a 86 y.o. female with past medical history significant for CHF, chronic A. fib, hypertension, hyperlipidemia, on Xarelto who presents for evaluation of nosebleed.  Seen last week for similar nosebleed.  Had packing placed which is actually removed.  She followed with Dr. Redmond Baseman with ENT.  Has had nosebleed located to right naris.  Compliant with Xarelto.  Has some mild lightheadedness since she has had bleeding.  No recent nasal surgery.  Has had nosebleed 2 times in the past has required packing.  Denies headache, dizziness, chest pain, shortness of breath, difficulty with word finding, unilateral weakness, abdominal pain.  Denies additional aggravating or alleviating factors.  History obtained from patient and past medical records.  No interpreter is used.  Patient from [facility]. Attempted to contact staff at facility for collateral however unable to reach staff.  HPI     Past Medical History:  Diagnosis Date  . Arthritis    "joints" (08/04/2014)  . Bipolar 1 disorder ([place])   . Cancer of right breast ([place])   . CHF (congestive heart failure) ([place])   . Chronic lower back pain   . Hyperlipemia   . Hypertension   . PAF (paroxysmal atrial fibrillation) [facility])     Patient Active Problem List   Diagnosis Date Noted  . Functional dyspnea (2/2 posture/positioning w/ abnormal sprirometry) 10/03/2016  . Bipolar 1 disorder ([place]) 10/03/2016  . PAF (paroxysmal atrial fibrillation) ([place]) 10/03/2016  . Hypertension 10/03/2016  . Orthostatic syncope 10/03/2016  . Diarrhea 10/03/2016  . Chronic diastolic heart failure ([place]) 10/03/2016  . Supratherapeutic INR 10/03/2016  . Dementia ([place]) 10/03/2016  . Hyperlipemia 10/03/2016  . CKD (chronic kidney disease) stage 3, GFR 30-59 ml/min  10/03/2016  . Symptomatic bradycardia   . Dyspnea 09/16/2016  . CHF (congestive heart failure) ([place]) 03/04/2016  . Acute renal failure superimposed on stage 3 chronic kidney disease ([place]) 02/08/2016  . Fall   . Syncope 12/05/2015  . Physical deconditioning 12/05/2015  . Encounter for therapeutic drug monitoring 12/22/2014  . Atrial fibrillation with RVR ([place]) 08/04/2014  . Paroxysmal atrial fibrillation ([place]) 08/04/2014  . Hypertensive urgency   . Obesity 09/27/2013  . Chronic back pain 09/14/2012  . Bipolar disorder ([place]) 09/14/2012    Past Surgical History:  Procedure Laterality Date  . AUGMENTATION MAMMAPLASTY Bilateral   . BACK SURGERY    . BREAST BIOPSY Right   . BREAST RECONSTRUCTION Bilateral   . CATARACT EXTRACTION W/ INTRAOCULAR LENS  IMPLANT, BILATERAL Bilateral   . EXCISIONAL HEMORRHOIDECTOMY    . [place]; 1973; 1985   "ruptured discs each time"  . MASTECTOMY Right    cancer  . PLACEMENT OF BREAST IMPLANTS Bilateral    "had to take tissue out of left"  . TONSILLECTOMY    . VAGINAL HYSTERECTOMY       OB History   No obstetric history on file.     Family History  Problem Relation Age of Onset  . Stroke Mother   . Heart disease Father   . Alzheimer's disease Sister   . Leukemia Brother   . Arthritis Maternal Grandmother   . Breast cancer Daughter     Social History   Tobacco Use  . Smoking status: Never Smoker  . Smokeless tobacco: Never Used  Substance Use Topics  . Alcohol use: No  . Drug use: No    Home Medications Prior to Admission medications   Medication Sig Start Date End Date Taking? Authorizing Provider  acetaminophen (TYLENOL) 500 MG tablet Take 500 mg by mouth every 4 (four) hours as needed for mild pain or headache.    [provider]  albuterol (PROVENTIL) (2.5 MG/3ML) 0.083% nebulizer solution Take 3 mLs (2.5 mg total) by nebulization every 6 (six) hours as needed for wheezing or shortness of  breath. Patient not taking: Reported on 01/17/2017 09/16/16   Magdalen Spatz, NP  alum & mag hydroxide-simeth (MAALOX/MYLANTA) 200-200-20 MG/5ML suspension Take 30 mLs by mouth as needed for indigestion.     [provider]  atorvastatin (LIPITOR) 40 MG tablet Take 40 mg by mouth daily.    [provider]  carboxymethylcellul-glycerin (OPTIVE) 0.5-0.9 % ophthalmic solution Place 1 drop into both eyes 4 (four) times daily.    [provider]  carvedilol (COREG) 3.125 MG tablet Take 1 tablet (3.125 mg total) by mouth 2 (two) times daily with a meal. PLEASE START THE LOWER DOSE ON Monday, 7/30 DEPENDING ON HER HEART RATE. OK TO START IF HEART RATE IS NOT LESS THAN 65. 10/07/16   Bonnielee Haff, MD  cephALEXin (KEFLEX) 500 MG capsule Take 1 capsule (500 mg total) by mouth 4 (four) times daily. 05/24/18   Blanchie Dessert, MD  Cranberry-Vitamin C-Probiotic (AZO CRANBERRY) 250-30 MG TABS Take 1 tablet by mouth daily.    [provider]  digoxin (LANOXIN) 0.125 MG tablet Take 0.0625 mg by mouth daily. Hold if  pulse is less than 60    [provider]  diltiazem (CARDIZEM CD) 120 MG 24 hr capsule Take 120 mg daily by mouth.    [provider]  escitalopram (LEXAPRO) 10 MG tablet TAKE 1 TABLET(10 MG) BY MOUTH DAILY Patient taking differently: Take 10 mg by mouth daily.  09/29/17   Marletta Lor, MD  furosemide (LASIX) 20 MG tablet Take 20 mg by mouth daily. 04/16/18   [provider]  gabapentin (NEURONTIN) 100 MG capsule Take 100 mg by mouth at bedtime.    [provider]  guaifenesin (ROBITUSSIN) 100 MG/5ML syrup Take 200 mg by mouth every 6 (six) hours as needed for cough.    [provider]  loperamide (IMODIUM) 2 MG capsule Take 2 mg See admin instructions by mouth. Standing order: Take 1 capsule  By mouth each  Loose stool as needed for Diarrhea (Do not Exceed 8  Doses in 24hrs    [provider]  magnesium  hydroxide (MILK OF MAGNESIA) 400 MG/5ML suspension Take 30 mLs by mouth daily as needed for mild constipation.    [provider]  Melatonin 3 MG TABS Take 3 mg by mouth at bedtime.    [provider]  Neomycin-Bacitracin-Polymyxin ([employer] TRIPLE ANTIBIOTIC) [phone] OINT Apply 1 application topically as needed. For skin tear. Abrasions* Clean area with normal saline, apply TAO, cover with bandaid or Gauze and tape. Change as needed until healed.    [provider]  polyethylene glycol powder (GLYCOLAX/MIRALAX) powder Take 17 g by mouth daily as needed for moderate constipation. Patient not taking: Reported on 12/11/2017 10/05/16   Bonnielee Haff, MD  QUEtiapine (SEROQUEL) 25 MG tablet Take 1 tablet (25 mg total) by mouth at bedtime. 10/05/16   Bonnielee Haff, MD  Rivaroxaban (XARELTO) 15 MG TABS tablet Take 15 mg by mouth daily.  [provider]  sennosides-docusate sodium (SENOKOT-S) 8.6-50 MG tablet Take 1 tablet by mouth daily. Patient taking differently: Take 1 tablet by mouth 2 (two) times daily.  05/28/16   Marletta Lor, MD  traMADol (ULTRAM) 50 MG tablet Take 1 tablet (50 mg total) by mouth See admin instructions. Take 50 mg by mouth three times a day at 8:00 am, 14:00 and 22:00 Also take 50 mg by mouth every four hours as needed for sever pain not to exceed total 4/day Patient taking differently: Take 50 mg by mouth 3 (three) times daily.  10/05/16   Bonnielee Haff, MD  traMADol (ULTRAM) 50 MG tablet Take 50 mg by mouth 3 (three) times daily as needed for moderate pain or severe pain.    [provider]  traZODone (DESYREL) 150 MG tablet Take 1 tablet (150 mg total) by mouth at bedtime. 10/05/16   Bonnielee Haff, MD  warfarin (COUMADIN) 1 MG tablet Take 1-4 tablets (1-4 mg total) by mouth daily. Take 2mg  daily starting 7/28. Check PT/INR on 7/30 and then dose accordingly. Patient not taking: Reported on 12/10/2017 10/05/16   Bonnielee Haff,  MD    Allergies    Codeine, Morphine and related, Valium [diazepam], Sulfa antibiotics, Darvon [propoxyphene], Floxin [ofloxacin], Levsin [hyoscyamine sulfate], Penicillins, and Zantac [ranitidine hcl]  Review of Systems   Review of Systems  Constitutional: Negative.   HENT: Positive for nosebleeds.   Respiratory: Negative.   Cardiovascular: Negative.   Gastrointestinal: Negative.   Genitourinary: Negative.   Musculoskeletal: Negative.   Skin: Negative.   Neurological: Positive for light-headedness. Negative for dizziness, tremors, syncope, speech difficulty, weakness and headaches.  All other systems reviewed and are negative.  Physical Exam Updated Vital Signs BP (!) 170/87   Pulse 62   Temp 98 F (36.7 C) (Axillary)   Resp 18   LMP 10/10/1969   SpO2 100%   Physical Exam Vitals and nursing note reviewed.  Constitutional:      General: She is not in acute distress.    Appearance: She is well-developed. She is not ill-appearing, toxic-appearing or diaphoretic.  HENT:     Head: Normocephalic and atraumatic.     Jaw: There is normal jaw occlusion.     Nose:     Comments: Leading from left nares.  Patient with anterior bleed to right nares.    Mouth/Throat:     Lips: Pink.     Mouth: Mucous membranes are moist.     Pharynx: Oropharynx is clear. Uvula midline.     Comments: Blood in posterior oropharynx. Eyes:     Pupils: Pupils are equal, round, and reactive to light.     Comments: PERRLA.  EOMs intact  Neck:     Comments: No neck stiffness or neck rigidity Cardiovascular:     Rate and Rhythm: Normal rate. Rhythm regularly irregular.     Pulses: Normal pulses.     Heart sounds: Normal heart sounds.     Comments: A. fib without RVR Pulmonary:     Effort: No respiratory distress.     Breath sounds: Normal breath sounds and air entry.     Comments: Clear to auscultation bilaterally Abdominal:     General: There is no distension.     Comments: Soft, nontender   Musculoskeletal:        General: Normal range of motion.     Cervical back: Normal range of motion.     Comments: Moves all 4 extremities without difficulty  Skin:  General: Skin is warm and dry.     Comments: Brisk capillary refill  Neurological:     Mental Status: She is alert.     Comments: Cranial nerves II through XII grossly intact    ED Results / Procedures / Treatments   Labs (all labs ordered are listed, but only abnormal results are displayed) Labs Reviewed  BASIC METABOLIC PANEL - Abnormal; Notable for the following components:      Result Value   Creatinine, Ser 1.17 (*)    GFR calc non Af Amer 42 (*)    GFR calc Af Amer 49 (*)    All other components within normal limits  CBC WITH DIFFERENTIAL/PLATELET    EKG EKG Interpretation  Date/Time:  Saturday June 12 2019 22:19:10 EDT Ventricular Rate:  60 PR Interval:    QRS Duration: 86 QT Interval:  453 QTC Calculation: 453 R Axis:   -41 Text Interpretation: Atrial fibrillation Left axis deviation Low voltage, precordial leads Consider anterior infarct Confirmed by Quintella Reichert [phone]) on 06/12/2019 11:40:39 PM   Radiology No results found.  Procedures .Epistaxis Management  Date/Time: 06/12/2019 11:17 PM Performed by: Nettie Elm, PA-C Authorized by: Nettie Elm, PA-C   Consent:    Consent obtained:  Verbal   Consent given by:  Patient   Risks discussed:  Bleeding, infection, nasal injury and pain   Alternatives discussed:  No treatment, delayed treatment, alternative treatment, observation and referral Anesthesia (see MAR for exact dosages):    Anesthesia method:  Topical application   Topical anesthetic:  LET Procedure details:    Treatment site:  R posterior and R anterior   Treatment method:  Silver nitrate and anterior pack   Treatment complexity:  Extensive   Treatment episode: recurring   Post-procedure details:    Assessment:  Bleeding stopped   Patient tolerance of  procedure:  Tolerated well, no immediate complications   (including critical care time)  Medications Ordered in ED Medications  lidocaine-EPINEPHrine-tetracaine (LET) topical gel (has no administration in time range)  oxymetazoline (AFRIN) 0.05 % nasal spray 1 spray (1 spray Each Nare Given 06/12/19 2143)   ED Course  I have reviewed the triage vital signs and the nursing notes.  Pertinent labs & imaging results that were available during my care of the patient were reviewed by me and considered in my medical decision making (see chart for details).  86 year old female presents for evaluation of nosebleed.  She is afebrile, nonseptic, not ill-appearing.  Seen last week with bleed to right nares at that time. Had packing placed.  Follow-up with ENT this week without recurrent bleed.  Patient had self removed her packing prior to ENT evaluation.  She has some slight lightheadedness today however has a nonfocal neurologic exam.  Active bleed to right nares, unable to assess for anterior posterior bleed due to persistent bleed.  She is also spitting up blood..  Will attempt Afrin and reassess.  Plan on labs given some lightheadedness to assess for anemia.  Low suspicion for acute CVA, cardiac or pulmonary reason for lightheadedness.  Patient with persistent epistaxis despite cartery, Afrin and LET.  She is spitting up blood due to it draining down the back of her throat.  Nasal packing placed.  Labs without significant abnormality. EKG with Afib without RVR, similar to previous EKG.  Patient reassessed.  She has been ambulatory to restroom without difficulty.  Leading has stopped since packing placed.  Patient has been observed without any recurrent bleed.  She will follow-up outpatient with ENT.  Discussed return precautions.  Patient voiced understanding agreeable for follow-up.  Attempted to contact [facility] to discuss with staff for follow-up however unable to be reached.  Patient seen and  evaluated by attending Dr. Ralene Bathe who agrees with above treatment, plan and disposition.  The patient has been appropriately medically screened and/or stabilized in the ED. I have low suspicion for any other emergent medical condition which would require further screening, evaluation or treatment in the ED or require inpatient management.  Patient is hemodynamically stable and in no acute distress.  Patient able to ambulate in department prior to ED.  Evaluation does not show acute pathology that would require ongoing or additional emergent interventions while in the emergency department or further inpatient treatment.  I have discussed the diagnosis with the patient and answered all questions.  Pain is been managed while in the emergency department and patient has no further complaints prior to discharge.  Patient is comfortable with plan discussed in room and is stable for discharge at this time.  I have discussed strict return precautions for returning to the emergency department.  Patient was encouraged to follow-up with PCP/specialist refer to at discharge.    MDM Rules/Calculators/A&P                       Final Clinical Impression(s) / ED Diagnoses Final diagnoses:  Right-sided epistaxis  Chronic anticoagulation    Rx / DC Orders ED Discharge Orders    None       Dajon Lazar A, PA-C 06/12/19 2346    Quintella Reichert, MD 06/13/19 1129

## 2019-06-13 ENCOUNTER — Other Ambulatory Visit: Payer: Self-pay

## 2019-06-13 ENCOUNTER — Emergency Department (HOSPITAL_COMMUNITY)
Admission: EM | Admit: 2019-06-13 | Discharge: 2019-06-13 | Disposition: A | Discharge status: Home / Self Care | Payer: Medicare Other | Source: Home / Self Care | Attending: Emergency Medicine | Admitting: Emergency Medicine

## 2019-06-13 DIAGNOSIS — I130 Hypertensive heart and chronic kidney disease with heart failure and stage 1 through stage 4 chronic kidney disease, or unspecified chronic kidney disease: Secondary | ICD-10-CM | POA: Insufficient documentation

## 2019-06-13 DIAGNOSIS — I5032 Chronic diastolic (congestive) heart failure: Secondary | ICD-10-CM | POA: Insufficient documentation

## 2019-06-13 DIAGNOSIS — Z79899 Other long term (current) drug therapy: Secondary | ICD-10-CM | POA: Insufficient documentation

## 2019-06-13 DIAGNOSIS — N183 Chronic kidney disease, stage 3 unspecified: Secondary | ICD-10-CM | POA: Insufficient documentation

## 2019-06-13 DIAGNOSIS — R040 Epistaxis: Secondary | ICD-10-CM

## 2019-06-13 DIAGNOSIS — R519 Headache, unspecified: Secondary | ICD-10-CM | POA: Insufficient documentation

## 2019-06-13 DIAGNOSIS — I4891 Unspecified atrial fibrillation: Secondary | ICD-10-CM | POA: Insufficient documentation

## 2019-06-13 DIAGNOSIS — Z7901 Long term (current) use of anticoagulants: Secondary | ICD-10-CM | POA: Insufficient documentation

## 2019-06-13 MED ORDER — HYDROCODONE-ACETAMINOPHEN 5-325 MG PO TABS: 2.0000 | ORAL_TABLET | Freq: Three times a day (TID) | ORAL | 0 refills | Status: DC | PRN

## 2019-06-13 MED ORDER — HYDROCODONE-ACETAMINOPHEN 5-325 MG PO TABS
1.0000 | ORAL_TABLET | Freq: Once | ORAL | Status: AC
  Administered 2019-06-13: 1 via ORAL
  Filled 2019-06-13: qty 1

## 2019-06-13 NOTE — Discharge Instructions (Signed)
Follow-up with ENT as previously recommended.

## 2019-06-13 NOTE — ED Triage Notes (Signed)
Pt presents from [facility] for bleeding around packing applied to right nare last night in ED to control epistaxis. No active bleeding noted by EMS, EMS unable to visualize bleeding or clotting at back of throat. Takes xarelto. H/o chronic pain, schizoaffective disorder. EMS BP 190/100.

## 2019-06-13 NOTE — ED Notes (Signed)
CALLED [facility] FOR TRANSPORT TO [facility]--Tameia Rafferty

## 2019-06-13 NOTE — ED Notes (Signed)
CALLED [facility] FOR TRANSPORT TO [name]--Tracy Rodriguez

## 2019-06-13 NOTE — ED Notes (Signed)
[facility] called for transport. [facility] called to update on pts condition.

## 2019-06-14 ENCOUNTER — Encounter (HOSPITAL_COMMUNITY): Payer: Self-pay | Admitting: Pharmacy Technician

## 2019-06-14 ENCOUNTER — Inpatient Hospital Stay (HOSPITAL_COMMUNITY)
Admission: EM | Admit: 2019-06-14 | Discharge: 2019-06-18 | DRG: 309 | Disposition: A | Discharge status: Skilled Nursing Facility | Payer: Medicare Other | Attending: Internal Medicine | Admitting: Internal Medicine

## 2019-06-14 ENCOUNTER — Emergency Department (HOSPITAL_COMMUNITY): Payer: Medicare Other

## 2019-06-14 ENCOUNTER — Other Ambulatory Visit: Payer: Self-pay

## 2019-06-14 DIAGNOSIS — I480 Paroxysmal atrial fibrillation: Secondary | ICD-10-CM | POA: Diagnosis present

## 2019-06-14 DIAGNOSIS — M79672 Pain in left foot: Secondary | ICD-10-CM | POA: Diagnosis present

## 2019-06-14 DIAGNOSIS — M1990 Unspecified osteoarthritis, unspecified site: Secondary | ICD-10-CM | POA: Diagnosis present

## 2019-06-14 DIAGNOSIS — I130 Hypertensive heart and chronic kidney disease with heart failure and stage 1 through stage 4 chronic kidney disease, or unspecified chronic kidney disease: Secondary | ICD-10-CM | POA: Diagnosis present

## 2019-06-14 DIAGNOSIS — E785 Hyperlipidemia, unspecified: Secondary | ICD-10-CM | POA: Diagnosis present

## 2019-06-14 DIAGNOSIS — Z90710 Acquired absence of both cervix and uterus: Secondary | ICD-10-CM

## 2019-06-14 DIAGNOSIS — I248 Other forms of acute ischemic heart disease: Secondary | ICD-10-CM | POA: Diagnosis present

## 2019-06-14 DIAGNOSIS — Z20822 Contact with and (suspected) exposure to covid-19: Secondary | ICD-10-CM | POA: Diagnosis present

## 2019-06-14 DIAGNOSIS — S92355A Nondisplaced fracture of fifth metatarsal bone, left foot, initial encounter for closed fracture: Secondary | ICD-10-CM | POA: Diagnosis present

## 2019-06-14 DIAGNOSIS — M545 Low back pain: Secondary | ICD-10-CM | POA: Diagnosis present

## 2019-06-14 DIAGNOSIS — R000 Tachycardia, unspecified: Secondary | ICD-10-CM | POA: Diagnosis present

## 2019-06-14 DIAGNOSIS — F319 Bipolar disorder, unspecified: Secondary | ICD-10-CM | POA: Diagnosis present

## 2019-06-14 DIAGNOSIS — Z9882 Breast implant status: Secondary | ICD-10-CM

## 2019-06-14 DIAGNOSIS — I160 Hypertensive urgency: Secondary | ICD-10-CM | POA: Diagnosis present

## 2019-06-14 DIAGNOSIS — W19XXXA Unspecified fall, initial encounter: Secondary | ICD-10-CM | POA: Diagnosis present

## 2019-06-14 DIAGNOSIS — Z66 Do not resuscitate: Secondary | ICD-10-CM | POA: Diagnosis present

## 2019-06-14 DIAGNOSIS — Z9841 Cataract extraction status, right eye: Secondary | ICD-10-CM | POA: Diagnosis not present

## 2019-06-14 DIAGNOSIS — Z880 Allergy status to penicillin: Secondary | ICD-10-CM

## 2019-06-14 DIAGNOSIS — G8929 Other chronic pain: Secondary | ICD-10-CM | POA: Diagnosis present

## 2019-06-14 DIAGNOSIS — E872 Acidosis: Secondary | ICD-10-CM | POA: Diagnosis present

## 2019-06-14 DIAGNOSIS — R296 Repeated falls: Secondary | ICD-10-CM | POA: Diagnosis present

## 2019-06-14 DIAGNOSIS — Z87440 Personal history of urinary (tract) infections: Secondary | ICD-10-CM

## 2019-06-14 DIAGNOSIS — Z8249 Family history of ischemic heart disease and other diseases of the circulatory system: Secondary | ICD-10-CM

## 2019-06-14 DIAGNOSIS — Z9842 Cataract extraction status, left eye: Secondary | ICD-10-CM

## 2019-06-14 DIAGNOSIS — I482 Chronic atrial fibrillation, unspecified: Secondary | ICD-10-CM | POA: Diagnosis present

## 2019-06-14 DIAGNOSIS — A419 Sepsis, unspecified organism: Secondary | ICD-10-CM | POA: Diagnosis not present

## 2019-06-14 DIAGNOSIS — Z961 Presence of intraocular lens: Secondary | ICD-10-CM | POA: Diagnosis present

## 2019-06-14 DIAGNOSIS — F0390 Unspecified dementia without behavioral disturbance: Secondary | ICD-10-CM | POA: Diagnosis present

## 2019-06-14 DIAGNOSIS — N1832 Chronic kidney disease, stage 3b: Secondary | ICD-10-CM | POA: Diagnosis present

## 2019-06-14 DIAGNOSIS — Z853 Personal history of malignant neoplasm of breast: Secondary | ICD-10-CM

## 2019-06-14 DIAGNOSIS — R040 Epistaxis: Secondary | ICD-10-CM | POA: Diagnosis present

## 2019-06-14 DIAGNOSIS — Y92129 Unspecified place in nursing home as the place of occurrence of the external cause: Secondary | ICD-10-CM | POA: Diagnosis not present

## 2019-06-14 DIAGNOSIS — Z79891 Long term (current) use of opiate analgesic: Secondary | ICD-10-CM

## 2019-06-14 DIAGNOSIS — Z885 Allergy status to narcotic agent status: Secondary | ICD-10-CM

## 2019-06-14 DIAGNOSIS — Z888 Allergy status to other drugs, medicaments and biological substances status: Secondary | ICD-10-CM

## 2019-06-14 DIAGNOSIS — I5032 Chronic diastolic (congestive) heart failure: Secondary | ICD-10-CM | POA: Diagnosis present

## 2019-06-14 DIAGNOSIS — Z8261 Family history of arthritis: Secondary | ICD-10-CM

## 2019-06-14 DIAGNOSIS — Z79899 Other long term (current) drug therapy: Secondary | ICD-10-CM

## 2019-06-14 DIAGNOSIS — Z882 Allergy status to sulfonamides status: Secondary | ICD-10-CM

## 2019-06-14 DIAGNOSIS — Z881 Allergy status to other antibiotic agents status: Secondary | ICD-10-CM

## 2019-06-14 DIAGNOSIS — Z7901 Long term (current) use of anticoagulants: Secondary | ICD-10-CM

## 2019-06-14 LAB — CBC WITH DIFFERENTIAL/PLATELET
Abs Immature Granulocytes: 0.13 10*3/uL — ABNORMAL HIGH (ref 0.00–0.07)
Basophils Absolute: 0 10*3/uL (ref 0.0–0.1)
Basophils Relative: 0 %
Eosinophils Absolute: 0 10*3/uL (ref 0.0–0.5)
Eosinophils Relative: 0 %
HCT: 49.1 % — ABNORMAL HIGH (ref 36.0–46.0)
Hemoglobin: 15.8 g/dL — ABNORMAL HIGH (ref 12.0–15.0)
Immature Granulocytes: 1 %
Lymphocytes Relative: 5 %
Lymphs Abs: 1 10*3/uL (ref 0.7–4.0)
MCH: 30.6 pg (ref 26.0–34.0)
MCHC: 32.2 g/dL (ref 30.0–36.0)
MCV: 95 fL (ref 80.0–100.0)
Monocytes Absolute: 1.1 10*3/uL — ABNORMAL HIGH (ref 0.1–1.0)
Monocytes Relative: 5 %
Neutro Abs: 18.1 10*3/uL — ABNORMAL HIGH (ref 1.7–7.7)
Neutrophils Relative %: 89 %
Platelets: 254 10*3/uL (ref 150–400)
RBC: 5.17 MIL/uL — ABNORMAL HIGH (ref 3.87–5.11)
RDW: 13.1 % (ref 11.5–15.5)
WBC: 20.3 10*3/uL — ABNORMAL HIGH (ref 4.0–10.5)
nRBC: 0 % (ref 0.0–0.2)

## 2019-06-14 LAB — PROTIME-INR
INR: 2.3 — ABNORMAL HIGH (ref 0.8–1.2)
Prothrombin Time: 25.1 seconds — ABNORMAL HIGH (ref 11.4–15.2)

## 2019-06-14 LAB — TROPONIN I (HIGH SENSITIVITY)
Troponin I (High Sensitivity): 24 ng/L — ABNORMAL HIGH (ref <18)
Troponin I (High Sensitivity): 28 ng/L — ABNORMAL HIGH (ref <18)
Troponin I (High Sensitivity): 31 ng/L — ABNORMAL HIGH (ref <18)

## 2019-06-14 LAB — URINALYSIS, ROUTINE W REFLEX MICROSCOPIC
Bilirubin Urine: NEGATIVE
Glucose, UA: NEGATIVE mg/dL
Ketones, ur: 5 mg/dL — AB
Leukocytes,Ua: NEGATIVE
Nitrite: NEGATIVE
Protein, ur: >=300 mg/dL — AB
Specific Gravity, Urine: 1.016 (ref 1.005–1.030)
pH: 6 (ref 5.0–8.0)

## 2019-06-14 LAB — COMPREHENSIVE METABOLIC PANEL
ALT: 14 U/L (ref 0–44)
AST: 23 U/L (ref 15–41)
Albumin: 3.7 g/dL (ref 3.5–5.0)
Alkaline Phosphatase: 58 U/L (ref 38–126)
Anion gap: 15 (ref 5–15)
BUN: 20 mg/dL (ref 8–23)
CO2: 25 mmol/L (ref 22–32)
Calcium: 9.4 mg/dL (ref 8.9–10.3)
Chloride: 98 mmol/L (ref 98–111)
Creatinine, Ser: 1.2 mg/dL — ABNORMAL HIGH (ref 0.44–1.00)
GFR calc Af Amer: 47 mL/min — ABNORMAL LOW (ref >60)
GFR calc non Af Amer: 41 mL/min — ABNORMAL LOW (ref >60)
Glucose, Bld: 137 mg/dL — ABNORMAL HIGH (ref 70–99)
Potassium: 3.9 mmol/L (ref 3.5–5.1)
Sodium: 138 mmol/L (ref 135–145)
Total Bilirubin: 1.2 mg/dL (ref 0.3–1.2)
Total Protein: 7.7 g/dL (ref 6.5–8.1)

## 2019-06-14 LAB — LACTIC ACID, PLASMA
Lactic Acid, Venous: 2.1 mmol/L (ref 0.5–1.9)
Lactic Acid, Venous: 2.1 mmol/L (ref 0.5–1.9)

## 2019-06-14 LAB — DIGOXIN LEVEL: Digoxin Level: 0.5 ng/mL — ABNORMAL LOW (ref 0.8–2.0)

## 2019-06-14 MED ORDER — FENTANYL CITRATE (PF) 100 MCG/2ML IJ SOLN
25.0000 ug | Freq: Once | INTRAMUSCULAR | Status: AC
  Administered 2019-06-14: 25 ug via INTRAVENOUS
  Filled 2019-06-14: qty 2

## 2019-06-14 MED ORDER — LACTATED RINGERS IV BOLUS
1000.0000 mL | Freq: Once | INTRAVENOUS | Status: AC
  Administered 2019-06-14: 1000 mL via INTRAVENOUS

## 2019-06-14 MED ORDER — SODIUM CHLORIDE 0.9 % IV BOLUS
1000.0000 mL | Freq: Once | INTRAVENOUS | Status: AC
  Administered 2019-06-14: 1000 mL via INTRAVENOUS

## 2019-06-14 MED ORDER — DILTIAZEM HCL-DEXTROSE 125-5 MG/125ML-% IV SOLN (PREMIX)
5.0000 mg/h | INTRAVENOUS | Status: DC
  Filled 2019-06-14: qty 125

## 2019-06-14 MED ORDER — VANCOMYCIN HCL 1500 MG/300ML IV SOLN
1500.0000 mg | Freq: Once | INTRAVENOUS | Status: AC
  Administered 2019-06-14: 1500 mg via INTRAVENOUS
  Filled 2019-06-14: qty 300

## 2019-06-14 MED ORDER — SODIUM CHLORIDE 0.9 % IV SOLN
1.0000 g | Freq: Once | INTRAVENOUS | Status: AC
  Administered 2019-06-14: 1 g via INTRAVENOUS
  Filled 2019-06-14: qty 10

## 2019-06-14 MED ORDER — DILTIAZEM LOAD VIA INFUSION
20.0000 mg | Freq: Once | INTRAVENOUS | Status: DC
  Filled 2019-06-14: qty 20

## 2019-06-14 NOTE — ED Notes (Signed)
Pt HR now maintaining in the 80's. Will cont to monitor.

## 2019-06-14 NOTE — ED Notes (Signed)
Per previous RN, in&out cath not needed due to sample already being collected

## 2019-06-14 NOTE — Progress Notes (Signed)
Orthopedic Tech Progress Note Patient Details:  Tracy Rodriguez August 30, 1931 [id number]  Ortho Devices Type of Ortho Device: CAM walker Ortho Device/Splint Location: LLE Ortho Device/Splint Interventions: Ordered, Application   Post Interventions Patient Tolerated: Well Instructions Provided: [facility] [place] 06/14/2019, 4:32 PM

## 2019-06-14 NOTE — ED Triage Notes (Signed)
Pt from [place] after sliding down the wall, staff caught pt and put her into bed. Several hours later pt is sitting on the commode and unable to stand up due to pain to the left foot. Pt also was complaining of dizziness. Pt able to stand with assistance. Family requesting a urine check due to hx of UTI.  168/100 HR 88 irrregular  RR 20 95% RA CBG 125 97.40F

## 2019-06-14 NOTE — ED Notes (Signed)
Pt HR noted to be 160, EKG captured and given to EDP. Pt c/o difficulty breathing and abd pain during this event. Oxygen saturations remained 94%. Will cont to monitor.

## 2019-06-14 NOTE — ED Provider Notes (Signed)
Assumed care from [facility] PA-C at 1600.  Please see this provider's note for initial history and physical exam.  Plan abdominal pain office for follow-up urinalysis and subsequent admission for likely urosepsis and increased frequency of falls complicated by an associated foot fracture.  Repeat ECG demonstrated atrial fibrillation 134 bpm with left axis deviation, LAFB, PRWP, ST depressions in the lateral leads and inferior leads, ST elevations in aVR with a dominant R wave.  Upon reassessment the patient was complaining of lower abdominal pain and back pain but no chest pain or shortness of breath.  Patient's heart rate came down without intervention into the 90s to low 100s.  Will obtain a delta troponin.  Initial troponin 24, repeat troponin 28, suspect the patient went into A. fib with RVR in the setting of an infectious stressor.   Urinalysis demonstrated rare bacteria, negative leukoesterase, negative nitrites.  This significantly lowers my suspicion for urosepsis.  Regarding concern is somewhat increased for possible toxic shock syndrome in the setting of recently placed nasal packing for epistaxis.  All this is in the setting of lactic acidosis, intermittent A. fib with RVR, significant leukocytosis 20.3 with left shift.  Patient has been treated with broad-spectrum antibiotics.  Feel the patient warrants admission given her laboratory abnormalities and clinical presentation for sepsis of unclear origin.  Patient subsequently admitted for ongoing evaluation, monitoring and management.   Care of this patient was discussed with and overseen by Dr. Aris Everts, MD 06/14/19 [id number]    Lajean Saver, MD 06/15/19 1433

## 2019-06-14 NOTE — ED Provider Notes (Signed)
[facility] EMERGENCY DEPARTMENT Provider Note   CSN: [id number] Arrival date & time: 06/13/19  1722     History Chief Complaint  Patient presents with  . Epistaxis    Tracy Rodriguez is a 86 y.o. female.  HPI   86 year old female with epistaxis.  She is actually seen in the emergency room yesterday and had packing placed.  She describes a small amount of bleeding from the right side despite packing.  She also complaining of pain/pressure.  No fevers or chills.  She does not feel like there is anything dripping down the back of her throat.  She is not spitting up blood.  Past Medical History:  Diagnosis Date  . Arthritis    "joints" (08/04/2014)  . Bipolar 1 disorder ([place])   . Cancer of right breast ([place])   . CHF (congestive heart failure) ([place])   . Chronic lower back pain   . Hyperlipemia   . Hypertension   . PAF (paroxysmal atrial fibrillation) [facility])     Patient Active Problem List   Diagnosis Date Noted  . Functional dyspnea (2/2 posture/positioning w/ abnormal sprirometry) 10/03/2016  . Bipolar 1 disorder ([place]) 10/03/2016  . PAF (paroxysmal atrial fibrillation) ([place]) 10/03/2016  . Hypertension 10/03/2016  . Orthostatic syncope 10/03/2016  . Diarrhea 10/03/2016  . Chronic diastolic heart failure ([place]) 10/03/2016  . Supratherapeutic INR 10/03/2016  . Dementia ([place]) 10/03/2016  . Hyperlipemia 10/03/2016  . CKD (chronic kidney disease) stage 3, GFR 30-59 ml/min 10/03/2016  . Symptomatic bradycardia   . Dyspnea 09/16/2016  . CHF (congestive heart failure) ([place]) 03/04/2016  . Acute renal failure superimposed on stage 3 chronic kidney disease ([place]) 02/08/2016  . Fall   . Syncope 12/05/2015  . Physical deconditioning 12/05/2015  . Encounter for therapeutic drug monitoring 12/22/2014  . Atrial fibrillation with RVR ([place]) 08/04/2014  . Paroxysmal atrial fibrillation ([place]) 08/04/2014  . Hypertensive urgency   . Obesity 09/27/2013  . Chronic back pain  09/14/2012  . Bipolar disorder ([place]) 09/14/2012    Past Surgical History:  Procedure Laterality Date  . AUGMENTATION MAMMAPLASTY Bilateral   . BACK SURGERY    . BREAST BIOPSY Right   . BREAST RECONSTRUCTION Bilateral   . CATARACT EXTRACTION W/ INTRAOCULAR LENS  IMPLANT, BILATERAL Bilateral   . EXCISIONAL HEMORRHOIDECTOMY    . [place]; 1973; 1985   "ruptured discs each time"  . MASTECTOMY Right    cancer  . PLACEMENT OF BREAST IMPLANTS Bilateral    "had to take tissue out of left"  . TONSILLECTOMY    . VAGINAL HYSTERECTOMY       OB History   No obstetric history on file.     Family History  Problem Relation Age of Onset  . Stroke Mother   . Heart disease Father   . Alzheimer's disease Sister   . Leukemia Brother   . Arthritis Maternal Grandmother   . Breast cancer Daughter     Social History   Tobacco Use  . Smoking status: Never Smoker  . Smokeless tobacco: Never Used  Substance Use Topics  . Alcohol use: No  . Drug use: No    Home Medications Prior to Admission medications   Medication Sig Start Date End Date Taking? Authorizing Provider  acetaminophen (TYLENOL) 500 MG tablet Take 500 mg by mouth every 4 (four) hours as needed for mild pain or headache.    [provider]  albuterol (PROVENTIL) (2.5 MG/3ML) 0.083% nebulizer solution  Take 3 mLs (2.5 mg total) by nebulization every 6 (six) hours as needed for wheezing or shortness of breath. Patient not taking: Reported on 01/17/2017 09/16/16   Magdalen Spatz, NP  alum & mag hydroxide-simeth (MAALOX/MYLANTA) 200-200-20 MG/5ML suspension Take 30 mLs by mouth as needed for indigestion.     [provider]  atorvastatin (LIPITOR) 40 MG tablet Take 40 mg by mouth daily.    [provider]  carboxymethylcellul-glycerin (OPTIVE) 0.5-0.9 % ophthalmic solution Place 1 drop into both eyes 4 (four) times daily.    [provider]  carvedilol (COREG) 3.125 MG tablet Take 1  tablet (3.125 mg total) by mouth 2 (two) times daily with a meal. PLEASE START THE LOWER DOSE ON Monday, 7/30 DEPENDING ON HER HEART RATE. OK TO START IF HEART RATE IS NOT LESS THAN 65. 10/07/16   Bonnielee Haff, MD  cephALEXin (KEFLEX) 500 MG capsule Take 1 capsule (500 mg total) by mouth 4 (four) times daily. 05/24/18   Blanchie Dessert, MD  Cranberry-Vitamin C-Probiotic (AZO CRANBERRY) 250-30 MG TABS Take 1 tablet by mouth daily.    [provider]  digoxin (LANOXIN) 0.125 MG tablet Take 0.0625 mg by mouth daily. Hold if  pulse is less than 60    [provider]  diltiazem (CARDIZEM CD) 120 MG 24 hr capsule Take 120 mg daily by mouth.    [provider]  escitalopram (LEXAPRO) 10 MG tablet TAKE 1 TABLET(10 MG) BY MOUTH DAILY Patient taking differently: Take 10 mg by mouth daily.  09/29/17   Marletta Lor, MD  furosemide (LASIX) 20 MG tablet Take 20 mg by mouth daily. 04/16/18   [provider]  gabapentin (NEURONTIN) 100 MG capsule Take 100 mg by mouth at bedtime.    [provider]  guaifenesin (ROBITUSSIN) 100 MG/5ML syrup Take 200 mg by mouth every 6 (six) hours as needed for cough.    [provider]  HYDROcodone-acetaminophen (NORCO/VICODIN) 5-325 MG tablet Take 2 tablets by mouth every 8 (eight) hours as needed. 06/13/19   Virgel Manifold, MD  loperamide (IMODIUM) 2 MG capsule Take 2 mg See admin instructions by mouth. Standing order: Take 1 capsule  By mouth each  Loose stool as needed for Diarrhea (Do not Exceed 8  Doses in 24hrs    [provider]  magnesium hydroxide (MILK OF MAGNESIA) 400 MG/5ML suspension Take 30 mLs by mouth daily as needed for mild constipation.    [provider]  Melatonin 3 MG TABS Take 3 mg by mouth at bedtime.    [provider]  Neomycin-Bacitracin-Polymyxin ([employer] TRIPLE ANTIBIOTIC) [phone] OINT Apply 1 application topically as needed. For skin tear. Abrasions* Clean area with  normal saline, apply TAO, cover with bandaid or Gauze and tape. Change as needed until healed.    [provider]  polyethylene glycol powder (GLYCOLAX/MIRALAX) powder Take 17 g by mouth daily as needed for moderate constipation. Patient not taking: Reported on 12/11/2017 10/05/16   Bonnielee Haff, MD  QUEtiapine (SEROQUEL) 25 MG tablet Take 1 tablet (25 mg total) by mouth at bedtime. 10/05/16   Bonnielee Haff, MD  Rivaroxaban (XARELTO) 15 MG TABS tablet Take 15 mg by mouth daily.    [provider]  sennosides-docusate sodium (SENOKOT-S) 8.6-50 MG tablet Take 1 tablet by mouth daily. Patient taking differently: Take 1 tablet by mouth 2 (two) times daily.  05/28/16   Marletta Lor, MD  traMADol (ULTRAM) 50 MG tablet Take 1 tablet (  50 mg total) by mouth See admin instructions. Take 50 mg by mouth three times a day at 8:00 am, 14:00 and 22:00 Also take 50 mg by mouth every four hours as needed for sever pain not to exceed total 4/day Patient taking differently: Take 50 mg by mouth 3 (three) times daily.  10/05/16   Bonnielee Haff, MD  traMADol (ULTRAM) 50 MG tablet Take 50 mg by mouth 3 (three) times daily as needed for moderate pain or severe pain.    [provider]  traZODone (DESYREL) 150 MG tablet Take 1 tablet (150 mg total) by mouth at bedtime. 10/05/16   Bonnielee Haff, MD  warfarin (COUMADIN) 1 MG tablet Take 1-4 tablets (1-4 mg total) by mouth daily. Take 2mg  daily starting 7/28. Check PT/INR on 7/30 and then dose accordingly. Patient not taking: Reported on 12/10/2017 10/05/16   Bonnielee Haff, MD    Allergies    Codeine, Morphine and related, Valium [diazepam], Sulfa antibiotics, Darvon [propoxyphene], Floxin [ofloxacin], Levsin [hyoscyamine sulfate], Penicillins, and Zantac [ranitidine hcl]  Review of Systems   Review of Systems All systems reviewed and negative, other than as noted in HPI. Physical Exam Updated Vital Signs BP (!) 130/95   Pulse 98    Temp 98.5 F (36.9 C)   Resp 14   LMP 10/10/1969   SpO2 95%   Physical Exam Vitals and nursing note reviewed.  Constitutional:      General: She is not in acute distress.    Appearance: She is well-developed.  HENT:     Head: Normocephalic and atraumatic.     Ears:     Comments: Nasal packing in place on right.  Old appearing blood.  No active bleeding appreciated.  Oropharynx clear. Eyes:     General:        Right eye: No discharge.        Left eye: No discharge.     Conjunctiva/sclera: Conjunctivae normal.  Cardiovascular:     Rate and Rhythm: Normal rate and regular rhythm.     Heart sounds: Normal heart sounds. No murmur. No friction rub. No gallop.   Pulmonary:     Effort: Pulmonary effort is normal. No respiratory distress.     Breath sounds: Normal breath sounds.  Abdominal:     General: There is no distension.     Palpations: Abdomen is soft.     Tenderness: There is no abdominal tenderness.  Musculoskeletal:        General: No tenderness.     Cervical back: Neck supple.  Skin:    General: Skin is warm and dry.  Neurological:     Mental Status: She is alert.  Psychiatric:        Behavior: Behavior normal.        Thought Content: Thought content normal.     ED Results / Procedures / Treatments   Labs (all labs ordered are listed, but only abnormal results are displayed) Labs Reviewed - No data to display  EKG None  Radiology No results found.  Procedures Procedures (including critical care time)  Medications Ordered in ED Medications  HYDROcodone-acetaminophen (NORCO/VICODIN) 5-325 MG per tablet 1 tablet (1 tablet Oral Given 06/13/19 1844)    ED Course  I have reviewed the triage vital signs and the nursing notes.  Pertinent labs & imaging results that were available during my care of the patient were reviewed by me and considered in my medical decision making (see chart for details).  MDM Rules/Calculators/A&P                       86 year old female with epistaxis.  Nasal packing was just recently replaced.  I do not appreciate any significant bleeding on my exam currently.  Reassurance provided.  Return precautions discussed.  ENT follow-up otherwise. Final Clinical Impression(s) / ED Diagnoses Final diagnoses:  Epistaxis    Rx / DC Orders ED Discharge Orders         Ordered    HYDROcodone-acetaminophen (NORCO/VICODIN) 5-325 MG tablet  Every 8 hours PRN     06/13/19 Candie Chroman, MD 06/14/19 0122

## 2019-06-14 NOTE — ED Provider Notes (Signed)
[facility] EMERGENCY DEPARTMENT Provider Note   CSN: [id number] Arrival date & time: 06/14/19  1139     History No chief complaint on file.   Tracy Rodriguez is a 86 y.o. female with history of PAF on Coumadin, CKD, CHF, dementia who presents with left foot pain and dizziness. Pt is a poor historian - history was obtained from EMS, nursing staff, and the patient. Of note she has been to the ED twice in the past week for epistaxis and packing placed in the right nares. She states that she fell this morning and her left foot is hurting from that. She also reports being dizzy since the fall. Apparently around 9AM this morning staff found the patient in the bathroom and she started to slide down the wall. They caught her and deny that she had a fall. They assisted her back to bed however an hour or so later they found her in the bathroom on the commode and was complaining of left foot pain and was unable to get up from the commode. Pt denies chest pain, SOB, abdominal pain. The patient denies hitting her head but reports a headache. Family report that pt has frequent falls when she gets a UTI and request [place] to check this. Pt denies abdominal pain, N/V  HPI     Past Medical History:  Diagnosis Date  . Arthritis    "joints" (08/04/2014)  . Bipolar 1 disorder ([place])   . Cancer of right breast ([place])   . CHF (congestive heart failure) ([place])   . Chronic lower back pain   . Hyperlipemia   . Hypertension   . PAF (paroxysmal atrial fibrillation) [facility])     Patient Active Problem List   Diagnosis Date Noted  . Functional dyspnea (2/2 posture/positioning w/ abnormal sprirometry) 10/03/2016  . Bipolar 1 disorder ([place]) 10/03/2016  . PAF (paroxysmal atrial fibrillation) ([place]) 10/03/2016  . Hypertension 10/03/2016  . Orthostatic syncope 10/03/2016  . Diarrhea 10/03/2016  . Chronic diastolic heart failure ([place]) 10/03/2016  . Supratherapeutic INR 10/03/2016  . Dementia ([place])  10/03/2016  . Hyperlipemia 10/03/2016  . CKD (chronic kidney disease) stage 3, GFR 30-59 ml/min 10/03/2016  . Symptomatic bradycardia   . Dyspnea 09/16/2016  . CHF (congestive heart failure) ([place]) 03/04/2016  . Acute renal failure superimposed on stage 3 chronic kidney disease ([place]) 02/08/2016  . Fall   . Syncope 12/05/2015  . Physical deconditioning 12/05/2015  . Encounter for therapeutic drug monitoring 12/22/2014  . Atrial fibrillation with RVR ([place]) 08/04/2014  . Paroxysmal atrial fibrillation ([place]) 08/04/2014  . Hypertensive urgency   . Obesity 09/27/2013  . Chronic back pain 09/14/2012  . Bipolar disorder ([place]) 09/14/2012    Past Surgical History:  Procedure Laterality Date  . AUGMENTATION MAMMAPLASTY Bilateral   . BACK SURGERY    . BREAST BIOPSY Right   . BREAST RECONSTRUCTION Bilateral   . CATARACT EXTRACTION W/ INTRAOCULAR LENS  IMPLANT, BILATERAL Bilateral   . EXCISIONAL HEMORRHOIDECTOMY    . [place]; 1973; 1985   "ruptured discs each time"  . MASTECTOMY Right    cancer  . PLACEMENT OF BREAST IMPLANTS Bilateral    "had to take tissue out of left"  . TONSILLECTOMY    . VAGINAL HYSTERECTOMY       OB History   No obstetric history on file.     Family History  Problem Relation Age of Onset  . Stroke Mother   . Heart disease Father   .  Alzheimer's disease Sister   . Leukemia Brother   . Arthritis Maternal Grandmother   . Breast cancer Daughter     Social History   Tobacco Use  . Smoking status: Never Smoker  . Smokeless tobacco: Never Used  Substance Use Topics  . Alcohol use: No  . Drug use: No    Home Medications Prior to Admission medications   Medication Sig Start Date End Date Taking? Authorizing Provider  acetaminophen (TYLENOL) 500 MG tablet Take 500 mg by mouth every 4 (four) hours as needed for mild pain or headache.    [provider]  albuterol (PROVENTIL) (2.5 MG/3ML) 0.083% nebulizer solution Take 3 mLs  (2.5 mg total) by nebulization every 6 (six) hours as needed for wheezing or shortness of breath. Patient not taking: Reported on 01/17/2017 09/16/16   Magdalen Spatz, NP  alum & mag hydroxide-simeth (MAALOX/MYLANTA) 200-200-20 MG/5ML suspension Take 30 mLs by mouth as needed for indigestion.     [provider]  atorvastatin (LIPITOR) 40 MG tablet Take 40 mg by mouth daily.    [provider]  carboxymethylcellul-glycerin (OPTIVE) 0.5-0.9 % ophthalmic solution Place 1 drop into both eyes 4 (four) times daily.    [provider]  carvedilol (COREG) 3.125 MG tablet Take 1 tablet (3.125 mg total) by mouth 2 (two) times daily with a meal. PLEASE START THE LOWER DOSE ON Monday, 7/30 DEPENDING ON HER HEART RATE. OK TO START IF HEART RATE IS NOT LESS THAN 65. 10/07/16   Bonnielee Haff, MD  cephALEXin (KEFLEX) 500 MG capsule Take 1 capsule (500 mg total) by mouth 4 (four) times daily. 05/24/18   Blanchie Dessert, MD  Cranberry-Vitamin C-Probiotic (AZO CRANBERRY) 250-30 MG TABS Take 1 tablet by mouth daily.    [provider]  digoxin (LANOXIN) 0.125 MG tablet Take 0.0625 mg by mouth daily. Hold if  pulse is less than 60    [provider]  diltiazem (CARDIZEM CD) 120 MG 24 hr capsule Take 120 mg daily by mouth.    [provider]  escitalopram (LEXAPRO) 10 MG tablet TAKE 1 TABLET(10 MG) BY MOUTH DAILY Patient taking differently: Take 10 mg by mouth daily.  09/29/17   Marletta Lor, MD  furosemide (LASIX) 20 MG tablet Take 20 mg by mouth daily. 04/16/18   [provider]  gabapentin (NEURONTIN) 100 MG capsule Take 100 mg by mouth at bedtime.    [provider]  guaifenesin (ROBITUSSIN) 100 MG/5ML syrup Take 200 mg by mouth every 6 (six) hours as needed for cough.    [provider]  HYDROcodone-acetaminophen (NORCO/VICODIN) 5-325 MG tablet Take 2 tablets by mouth every 8 (eight) hours as needed. 06/13/19   Virgel Manifold, MD    loperamide (IMODIUM) 2 MG capsule Take 2 mg See admin instructions by mouth. Standing order: Take 1 capsule  By mouth each  Loose stool as needed for Diarrhea (Do not Exceed 8  Doses in 24hrs    [provider]  magnesium hydroxide (MILK OF MAGNESIA) 400 MG/5ML suspension Take 30 mLs by mouth daily as needed for mild constipation.    [provider]  Melatonin 3 MG TABS Take 3 mg by mouth at bedtime.    [provider]  Neomycin-Bacitracin-Polymyxin ([employer] TRIPLE ANTIBIOTIC) [phone] OINT Apply 1 application topically as needed. For skin tear. Abrasions* Clean area with normal saline, apply TAO, cover with bandaid or Gauze and tape. Change as needed until healed.    [provider]  polyethylene glycol powder (GLYCOLAX/MIRALAX) powder Take 17 g by mouth daily as needed for moderate constipation. Patient not taking: Reported on 12/11/2017 10/05/16   Bonnielee Haff, MD  QUEtiapine (SEROQUEL) 25 MG tablet Take 1 tablet (25 mg total) by mouth at bedtime. 10/05/16   Bonnielee Haff, MD  Rivaroxaban (XARELTO) 15 MG TABS tablet Take 15 mg by mouth daily.    [provider]  sennosides-docusate sodium (SENOKOT-S) 8.6-50 MG tablet Take 1 tablet by mouth daily. Patient taking differently: Take 1 tablet by mouth 2 (two) times daily.  05/28/16   Marletta Lor, MD  traMADol (ULTRAM) 50 MG tablet Take 1 tablet (50 mg total) by mouth See admin instructions. Take 50 mg by mouth three times a day at 8:00 am, 14:00 and 22:00 Also take 50 mg by mouth every four hours as needed for sever pain not to exceed total 4/day Patient taking differently: Take 50 mg by mouth 3 (three) times daily.  10/05/16   Bonnielee Haff, MD  traMADol (ULTRAM) 50 MG tablet Take 50 mg by mouth 3 (three) times daily as needed for moderate pain or severe pain.    [provider]  traZODone (DESYREL) 150 MG tablet Take 1 tablet (150 mg total) by mouth at bedtime. 10/05/16   Bonnielee Haff, MD  warfarin (COUMADIN) 1 MG tablet Take 1-4 tablets (1-4 mg total) by mouth daily. Take 2mg  daily starting 7/28. Check PT/INR on 7/30 and then dose accordingly. Patient not taking: Reported on 12/10/2017 10/05/16   Bonnielee Haff, MD    Allergies    Codeine, Morphine and related, Valium [diazepam], Sulfa antibiotics, Darvon [propoxyphene], Floxin [ofloxacin], Levsin [hyoscyamine sulfate], Penicillins, and Zantac [ranitidine hcl]  Review of Systems   Review of Systems  Unable to perform ROS: Dementia    Physical Exam Updated Vital Signs BP (!) 162/91 (BP Location: Right Arm)   Pulse 77   Temp 98.3 F (36.8 C) (Oral)   Resp 15   LMP 10/10/1969   SpO2 96%   Physical Exam Vitals and nursing note reviewed.  Constitutional:      General: She is not in acute distress.    Appearance: Normal appearance. She is well-developed. She is not ill-appearing.     Comments: Chronically ill appearing. Lethargic  HENT:     Head: Normocephalic and atraumatic.     Nose:     Comments: Nasal packing in right nares with bloody mucoid discharge around packing Eyes:     General: No scleral icterus.       Right eye: No discharge.        Left eye: No discharge.     Conjunctiva/sclera: Conjunctivae normal.     Pupils: Pupils are equal, round, and reactive to light.  Cardiovascular:     Rate and Rhythm: Normal rate. Rhythm irregularly irregular.  Pulmonary:     Effort: Pulmonary effort is normal. No respiratory distress.     Breath sounds: Normal breath sounds.  Abdominal:     General: There is no distension.     Palpations: Abdomen is soft.     Tenderness: There is no abdominal tenderness.  Musculoskeletal:     Cervical back: Normal range of motion.     Comments: Left lower leg: No obvious swelling, deformity, or warmth. Tenderness to palpation of the proximal calf. Mild tenderness of the ankle and foot. FROM of the knee. Skin is warm and perfused  Skin:    General: Skin is warm and dry.  Neurological:     Mental Status: She is alert and oriented to person, place, and time.  Psychiatric:        Behavior: Behavior normal.     ED Results / Procedures / Treatments   Labs (all labs ordered are listed, but only abnormal results are displayed) Labs Reviewed  COMPREHENSIVE METABOLIC PANEL - Abnormal; Notable for the following components:      Result Value   Glucose, Bld 137 (*)    Creatinine, Ser 1.20 (*)    GFR calc non Af Amer 41 (*)    GFR calc Af Amer 47 (*)    All other components within normal limits  CBC WITH DIFFERENTIAL/PLATELET - Abnormal; Notable for the following components:   WBC 20.3 (*)    RBC 5.17 (*)    Hemoglobin 15.8 (*)    HCT 49.1 (*)    Neutro Abs 18.1 (*)    Monocytes Absolute 1.1 (*)    Abs Immature Granulocytes 0.13 (*)    All other components within normal limits  LACTIC ACID, PLASMA - Abnormal; Notable for the following components:   Lactic Acid, Venous 2.1 (*)    All other components within normal limits  TROPONIN I (HIGH SENSITIVITY) - Abnormal; Notable for the following components:   Troponin I (High Sensitivity) 24 (*)    All other components within normal limits  CULTURE, BLOOD (ROUTINE X 2)  CULTURE, BLOOD (ROUTINE X 2)  URINALYSIS, ROUTINE W REFLEX MICROSCOPIC  LACTIC ACID, PLASMA    EKG EKG Interpretation  Date/Time:  Monday June 14 2019 11:49:26 EDT Ventricular Rate:  76 PR Interval:    QRS Duration: 84 QT Interval:  336 QTC Calculation: 378 R Axis:   -55 Text Interpretation: Atrial fibrillation Left anterior fascicular block Abnormal R-wave progression, late transition Nonspecific repol abnormality, diffuse leads new ST abnormality now present afib persistent Confirmed by Noemi Chapel [phone]) on 06/14/2019 11:55:45 AM   Radiology DG Tibia/Fibula Left  Result Date: 06/14/2019 CLINICAL DATA:  LEFT foot pain after fall. EXAM: LEFT TIBIA AND FIBULA - 2 VIEW COMPARISON:  None. FINDINGS: There is no evidence of fracture  or other focal bone lesions. Soft tissues are unremarkable. IMPRESSION: No fracture of the tibia or fibula. Electronically Signed   By: Suzy Bouchard M.D.   On: 06/14/2019 12:46   DG Chest Port 1 View  Result Date: 06/14/2019 CLINICAL DATA:  Leukocytosis EXAM: PORTABLE CHEST 1 VIEW COMPARISON:  05/24/2018 FINDINGS: The heart size and mediastinal contours are within normal limits. Both lungs are clear. The visualized skeletal structures are unremarkable. IMPRESSION: No active disease. Electronically Signed   By: Kathreen Devoid   On: 06/14/2019 14:20   DG Foot Complete Left  Result Date: 06/14/2019 CLINICAL DATA:  Left foot pain. Fall. EXAM: LEFT FOOT - COMPLETE 3+ VIEW COMPARISON:  None FINDINGS: Osteopenia and hallux valgus deformity noted. There is an acute fracture involving the head of the fifth proximal phalanx. Mild lateral angulation of the distal fracture fragment. On the lateral radiograph there is a suspicious lucency which extends through the plantar aspect of the base of the first proximal phalanx. Cannot rule out nondisplaced intra-articular fracture. No dislocations identified. IMPRESSION: 1. Acute fracture involves the head of the fifth proximal phalanx. 2. Cannot rule out nondisplaced intra-articular fracture involving the base of the first proximal phalanx. Correlation for any focal tenderness around the base of the first proximal phalanx. Electronically Signed   By: Kerby Moors M.D.   On: 06/14/2019 12:47  Procedures Procedures (including critical care time)  Medications Ordered in ED Medications  sodium chloride 0.9 % bolus 1,000 mL (has no administration in time range)  cefTRIAXone (ROCEPHIN) 1 g in sodium chloride 0.9 % 100 mL IVPB (has no administration in time range)    ED Course  I have reviewed the triage vital signs and the nursing notes.  Pertinent labs & imaging results that were available during my care of the patient were reviewed by me and considered in my  medical decision making (see chart for details).  86 year old female presents with left foot pain and dizziness.  On exam patient is mildly confused due to her dementia.  EKG shows rate controlled A. fib.  Lungs are clear to auscultation.  Abdomen is soft and nontender.  She is tenderness of the left lower leg, ankle and foot.  Family is concerned about UTI since patient has falls when she has one so we will check this and basic labs.   X-ray shows an acute fracture of the head of the fifth proximal phalanx and possible fracture of the first proximal phalanx.  Will order cam walker.  CBC shows significant leukocytosis of 20 with mildly elevated hemoglobin.  Chest x-ray added on as well as lactate and blood cultures.  CMP shows stable CKD.  Lactate is 2.1.  Fluids and antibiotics are pending due to delay in obtaining IV access.  Urine is pending as well.  At shift change meds and UA are pending. Care signed out to Dr. Dallie Piles. Anticipate admission since pt meets SIRS criteria.   MDM Rules/Calculators/A&P                       Final Clinical Impression(s) / ED Diagnoses Final diagnoses:  Closed nondisplaced fracture of fifth metatarsal bone of left foot, initial encounter    Rx / DC Orders ED Discharge Orders    None       Recardo Evangelist, PA-C 06/14/19 1615    Noemi Chapel, MD 06/15/19 [phone]

## 2019-06-15 ENCOUNTER — Encounter (HOSPITAL_COMMUNITY): Payer: Self-pay | Admitting: Internal Medicine

## 2019-06-15 ENCOUNTER — Inpatient Hospital Stay (HOSPITAL_COMMUNITY): Payer: Medicare Other

## 2019-06-15 DIAGNOSIS — R040 Epistaxis: Secondary | ICD-10-CM

## 2019-06-15 LAB — CBC
HCT: 42.2 % (ref 36.0–46.0)
Hemoglobin: 13.8 g/dL (ref 12.0–15.0)
MCH: 30.9 pg (ref 26.0–34.0)
MCHC: 32.7 g/dL (ref 30.0–36.0)
MCV: 94.4 fL (ref 80.0–100.0)
Platelets: 247 10*3/uL (ref 150–400)
RBC: 4.47 MIL/uL (ref 3.87–5.11)
RDW: 13.2 % (ref 11.5–15.5)
WBC: 19.7 10*3/uL — ABNORMAL HIGH (ref 4.0–10.5)
nRBC: 0 % (ref 0.0–0.2)

## 2019-06-15 LAB — PROCALCITONIN: Procalcitonin: <0.1 ng/mL

## 2019-06-15 LAB — TROPONIN I (HIGH SENSITIVITY): Troponin I (High Sensitivity): 44 ng/L — ABNORMAL HIGH (ref <18)

## 2019-06-15 LAB — SARS CORONAVIRUS 2 (TAT 6-24 HRS): SARS Coronavirus 2: NEGATIVE

## 2019-06-15 LAB — LACTIC ACID, PLASMA: Lactic Acid, Venous: 2 mmol/L (ref 0.5–1.9)

## 2019-06-15 MED ORDER — POLYVINYL ALCOHOL 1.4 % OP SOLN
1.0000 [drp] | Freq: Four times a day (QID) | OPHTHALMIC | Status: DC
  Administered 2019-06-15 – 2019-06-18 (×12): 1 [drp] via OPHTHALMIC
  Filled 2019-06-15: qty 15

## 2019-06-15 MED ORDER — ESCITALOPRAM OXALATE 10 MG PO TABS
10.0000 mg | ORAL_TABLET | Freq: Every day | ORAL | Status: DC
  Administered 2019-06-15 – 2019-06-18 (×4): 10 mg via ORAL
  Filled 2019-06-15 (×4): qty 1

## 2019-06-15 MED ORDER — TRAZODONE HCL 50 MG PO TABS
150.0000 mg | ORAL_TABLET | Freq: Every day | ORAL | Status: DC
  Administered 2019-06-15 – 2019-06-17 (×3): 150 mg via ORAL
  Filled 2019-06-15 (×3): qty 3

## 2019-06-15 MED ORDER — CARBOXYMETHYLCELLULOSE SODIUM 0.5 % OP SOLN: 1.0000 [drp] | Freq: Four times a day (QID) | OPHTHALMIC | Status: DC

## 2019-06-15 MED ORDER — FENTANYL CITRATE (PF) 100 MCG/2ML IJ SOLN
25.0000 ug | Freq: Once | INTRAMUSCULAR | Status: AC
  Administered 2019-06-15: 25 ug via INTRAVENOUS
  Filled 2019-06-15: qty 2

## 2019-06-15 MED ORDER — SALINE SPRAY 0.65 % NA SOLN
2.0000 | Freq: Four times a day (QID) | NASAL | Status: DC
  Administered 2019-06-15 – 2019-06-18 (×13): 2 via NASAL
  Filled 2019-06-15: qty 44

## 2019-06-15 MED ORDER — METHOCARBAMOL 500 MG PO TABS
500.0000 mg | ORAL_TABLET | Freq: Four times a day (QID) | ORAL | Status: DC | PRN
  Administered 2019-06-15 – 2019-06-18 (×2): 500 mg via ORAL
  Filled 2019-06-15 (×2): qty 1

## 2019-06-15 MED ORDER — SODIUM FLUORIDE 1.1 % DT PSTE: 1.0000 "application " | PASTE | DENTAL | Status: DC

## 2019-06-15 MED ORDER — GABAPENTIN 100 MG PO CAPS
100.0000 mg | ORAL_CAPSULE | Freq: Three times a day (TID) | ORAL | Status: DC
  Administered 2019-06-15 – 2019-06-18 (×11): 100 mg via ORAL
  Filled 2019-06-15 (×11): qty 1

## 2019-06-15 MED ORDER — CARVEDILOL 6.25 MG PO TABS
6.2500 mg | ORAL_TABLET | Freq: Two times a day (BID) | ORAL | Status: DC
  Administered 2019-06-15 – 2019-06-18 (×7): 6.25 mg via ORAL
  Filled 2019-06-15 (×7): qty 1

## 2019-06-15 MED ORDER — DILTIAZEM HCL ER COATED BEADS 120 MG PO CP24
120.0000 mg | ORAL_CAPSULE | Freq: Every day | ORAL | Status: DC
  Administered 2019-06-15 – 2019-06-18 (×4): 120 mg via ORAL
  Filled 2019-06-15 (×5): qty 1

## 2019-06-15 MED ORDER — OXYMETAZOLINE HCL 0.05 % NA SOLN: 1.0000 | Freq: Three times a day (TID) | NASAL | Status: DC | PRN

## 2019-06-15 MED ORDER — ATORVASTATIN CALCIUM 40 MG PO TABS
40.0000 mg | ORAL_TABLET | Freq: Every day | ORAL | Status: DC
  Administered 2019-06-15 – 2019-06-17 (×3): 40 mg via ORAL
  Filled 2019-06-15 (×3): qty 1

## 2019-06-15 MED ORDER — VANCOMYCIN HCL 1250 MG/250ML IV SOLN
1250.0000 mg | INTRAVENOUS | Status: DC
  Administered 2019-06-16: 1250 mg via INTRAVENOUS
  Filled 2019-06-15: qty 250

## 2019-06-15 MED ORDER — ACETAMINOPHEN 650 MG RE SUPP: 650.0000 mg | Freq: Four times a day (QID) | RECTAL | Status: DC | PRN

## 2019-06-15 MED ORDER — AZO CRANBERRY 250-30 MG PO TABS: 1.0000 | ORAL_TABLET | Freq: Every day | ORAL | Status: DC

## 2019-06-15 MED ORDER — VITAMIN D 25 MCG (1000 UNIT) PO TABS
2000.0000 [IU] | ORAL_TABLET | Freq: Every day | ORAL | Status: DC
  Administered 2019-06-15 – 2019-06-18 (×4): 2000 [IU] via ORAL
  Filled 2019-06-15 (×4): qty 2

## 2019-06-15 MED ORDER — DIGOXIN 125 MCG PO TABS
0.1250 mg | ORAL_TABLET | ORAL | Status: DC
  Administered 2019-06-15 – 2019-06-17 (×2): 0.125 mg via ORAL
  Filled 2019-06-15 (×3): qty 1

## 2019-06-15 MED ORDER — TRAMADOL HCL 50 MG PO TABS
50.0000 mg | ORAL_TABLET | ORAL | Status: DC
  Administered 2019-06-15 – 2019-06-18 (×11): 50 mg via ORAL
  Filled 2019-06-15 (×11): qty 1

## 2019-06-15 MED ORDER — METRONIDAZOLE IN NACL 5-0.79 MG/ML-% IV SOLN
500.0000 mg | Freq: Three times a day (TID) | INTRAVENOUS | Status: DC
  Administered 2019-06-15: 500 mg via INTRAVENOUS
  Filled 2019-06-15: qty 100

## 2019-06-15 MED ORDER — ACETAMINOPHEN 325 MG PO TABS
650.0000 mg | ORAL_TABLET | Freq: Four times a day (QID) | ORAL | Status: DC | PRN
  Administered 2019-06-15 – 2019-06-18 (×5): 650 mg via ORAL
  Filled 2019-06-15 (×5): qty 2

## 2019-06-15 MED ORDER — SODIUM CHLORIDE 0.9 % IV SOLN
2.0000 g | Freq: Two times a day (BID) | INTRAVENOUS | Status: DC
  Administered 2019-06-15 – 2019-06-17 (×5): 2 g via INTRAVENOUS
  Filled 2019-06-15 (×6): qty 2

## 2019-06-15 MED ORDER — HYDRALAZINE HCL 20 MG/ML IJ SOLN: 10.0000 mg | INTRAMUSCULAR | Status: DC | PRN

## 2019-06-15 MED ORDER — MELATONIN 3 MG PO TABS
3.0000 mg | ORAL_TABLET | Freq: Every day | ORAL | Status: DC
  Administered 2019-06-15 – 2019-06-17 (×3): 3 mg via ORAL
  Filled 2019-06-15 (×4): qty 1

## 2019-06-15 MED ORDER — CARVEDILOL 3.125 MG PO TABS
3.1250 mg | ORAL_TABLET | Freq: Two times a day (BID) | ORAL | Status: DC
  Administered 2019-06-15: 3.125 mg via ORAL
  Filled 2019-06-15 (×2): qty 1

## 2019-06-15 MED ORDER — RIVAROXABAN 15 MG PO TABS: 15.0000 mg | ORAL_TABLET | Freq: Every day | ORAL | Status: DC

## 2019-06-15 MED ORDER — SENNOSIDES-DOCUSATE SODIUM 8.6-50 MG PO TABS
1.0000 | ORAL_TABLET | Freq: Two times a day (BID) | ORAL | Status: DC
  Administered 2019-06-15 – 2019-06-18 (×6): 1 via ORAL
  Filled 2019-06-15 (×7): qty 1

## 2019-06-15 NOTE — ED Notes (Signed)
Lunch Tray Ordered @ 1052.  

## 2019-06-15 NOTE — Plan of Care (Signed)
  Problem: Education: Goal: Knowledge of General Education information will improve Description: Including pain rating scale, medication(s)/side effects and non-pharmacologic comfort measures Outcome: Progressing   Problem: Clinical Measurements: Goal: Ability to maintain clinical measurements within normal limits will improve Outcome: Progressing Goal: Will remain free from infection Outcome: Progressing   

## 2019-06-15 NOTE — ED Notes (Signed)
Breakfast Ordered 

## 2019-06-15 NOTE — Progress Notes (Addendum)
Patient seen and examined, admitted earlier this morning by Dr. Marlowe Sax. -Tracy Rodriguez is an 86 year old female from [place] with past history of dementia, chronic pain, arthritis, paroxysmal atrial fibrillation on anticoagulation, chronic diastolic CHF, bipolar disorder, was sent to the ED late last night after a fall, subsequently patient was sitting on commode and unable to stand up due to pain in her left foot, also had multiple other nonspecific complaints and was brought to the emergency room. -Of note she has been seen twice in the ED this past week with epistaxis and has a nasal pack in her right nare, she also sought Dr. Melida Quitter with ENT on 4/1 for same -In the ED she was noted to be tachycardic with A. fib RVR with subsequent improvement in her heart rate, noted to have leukocytosis white with white count of 20 K, mildly elevated high-sensitivity troponin, unremarkable urinalysis and chest x-ray. -X-ray of the left foot showed acute fracture involving head of the fifth proximal phalanx, a cam walker was placed in the ED -She was also started on empiric antibiotics and given fluids for presumed sepsis  ?  Sepsis versus stress demargination -On admission noted to have significant leukocytosis with mild lactic acidosis and tachycardia -Chest x-ray unremarkable, urinalysis also benign, CT abdomen pelvis was unremarkable as well -COVID-19 PCR is negative -Continue empiric IV antibiotics today-Vanc/cefepime -Check procalcitonin level -If remains afebrile and clinically stable consider stopping antibiotics tomorrow and monitor  Paroxysmal atrial fibrillation -In sinus rhythm now heart rate improving, continue Cardizem -Xarelto held given recurrent epistaxis  Hypertensive urgency -Blood pressure elevated on arrival now improving, continue home Cardizem and PRN hydralazine  Mildly elevated high-sensitivity troponin -in 28-31 range, patient denies any chest pain, complains of  chronic back leg and toe pain -No evidence of ACS, monitor  Fracture of proximal phalanx of fifth toe -CAM Walker placed in the ED -Physical therapy consult  History of recurrent epistaxis -Seen in the ED on 3/28 and 4/5 for this -She does have a lot of dark dried blood in her right nare -Hold anticoagulation -Seen by Dr. Redmond Baseman with ENT last week, recommend quick follow-up, no active bleeding at this time  Rest as noted by Dr. Marlowe Sax today  Tracy Polite, MD

## 2019-06-15 NOTE — H&P (Signed)
History and Physical    Tracy Rodriguez DOB: 08-12-1931 DOA: 06/14/2019  PCP: System, Pcp Not In Patient coming from: Nursing home  Chief Complaint: Fall, left foot pain, dizziness  HPI: Tracy Rodriguez is a 86 y.o. female with medical history significant of dementia, arthritis, CKD stage III, bipolar disorder, breast cancer of right breast status post mastectomy, chronic diastolic CHF, hypertension, hyperlipidemia, paroxysmal atrial fibrillation on anticoagulation presenting to the ED from her nursing home for evaluation of fall, left foot pain, and dizziness.  It is reported that patient had a fall at her nursing home.  She was found in the bathroom sliding down the wall, staff caught her and put her into the bed.  Several hours later patient was sitting on the commode and unable to stand up due to pain in her left foot.  She also complained of dizziness. Patient has been in the ED twice this past week for epistaxis and had packing placed in her right nare.  Limited history could be obtained from the patient due to her baseline dementia.  States she fell and broke her left foot.  She is not sure how she fell.  Denies headache.  Patient was resting comfortably when I had entered the room but after waking up started complaining of abdominal pain and was repeatedly requesting a pain medication.  No additional history could be obtained from her.  ED Course: Afebrile.  Initially not tachycardic but later found to be in A. fib with RVR with heart rate in the 130s.  Her heart rate came down to the 90s to low 100s without any intervention.  Blood pressure significantly elevated with systolic up to [id number].  Labs showing significant leukocytosis with WBC count 20.3.  Lactic acid 2.1 >2.1.  Blood culture x2 pending.  Creatinine 1.2, at baseline.  High-sensitivity troponin 24 >28 >31.  EKG with A. fib and new ST depressions in inferior and lateral leads.  INR 2.3.  SARS-CoV-2 PCR test negative.  UA with  negative nitrite, negative leukocytes, 0-5 WBCs, and rare bacteria.  Digoxin level subtherapeutic at 0.5.   Chest x-ray showing no active disease.  X-ray of left foot showing acute fracture involving the head of the fifth proximal phalanx.  Cannot rule out nondisplaced intra-articular fracture involving the base of the first proximal phalanx.  X-ray of left tibia/fibula negative for acute fracture.  Cam walker applied.  Patient received vancomycin, ceftriaxone, and 2 L fluid boluses.  Review of Systems:  All systems reviewed and apart from history of presenting illness, are negative.  Past Medical History:  Diagnosis Date  . Arthritis    "joints" (08/04/2014)  . Bipolar 1 disorder ([place])   . Cancer of right breast ([place])   . CHF (congestive heart failure) ([place])   . Chronic lower back pain   . Hyperlipemia   . Hypertension   . PAF (paroxysmal atrial fibrillation) ([place])     Past Surgical History:  Procedure Laterality Date  . AUGMENTATION MAMMAPLASTY Bilateral   . BACK SURGERY    . BREAST BIOPSY Right   . BREAST RECONSTRUCTION Bilateral   . CATARACT EXTRACTION W/ INTRAOCULAR LENS  IMPLANT, BILATERAL Bilateral   . EXCISIONAL HEMORRHOIDECTOMY    . [place]; 1973; 1985   "ruptured discs each time"  . MASTECTOMY Right    cancer  . PLACEMENT OF BREAST IMPLANTS Bilateral    "had to take tissue out of left"  . TONSILLECTOMY    . VAGINAL HYSTERECTOMY  reports that she has never smoked. She has never used smokeless tobacco. She reports that she does not drink alcohol or use drugs.  Allergies  Allergen Reactions  . Codeine Anaphylaxis and Nausea And Vomiting    Note: pt has tramadol for years with no reaction  . Morphine And Related Nausea And Vomiting    Severe nausea per daughter  . Valium [Diazepam] Shortness Of Breath  . Sulfa Antibiotics Itching and Rash  . Darvon [Propoxyphene] Nausea And Vomiting  . Floxin [Ofloxacin] Itching  . Levsin  [Hyoscyamine Sulfate] Other (See Comments)    Unknown reaction  . Penicillins Itching    Itching reported by [facility] Has patient had a PCN reaction causing immediate rash, facial/tongue/throat swelling, SOB or lightheadedness with hypotension: Yes Has patient had a PCN reaction causing severe rash involving mucus membranes or skin necrosis: No Has patient had a PCN reaction that required hospitalization No Has patient had a PCN reaction occurring within the last 10 years: No If all of the above answers are "NO", then may proceed with Cephalosporin use.   . Zantac [Ranitidine Hcl] Other (See Comments)    Unknown reaction    Family History  Problem Relation Age of Onset  . Stroke Mother   . Heart disease Father   . Alzheimer's disease Sister   . Leukemia Brother   . Arthritis Maternal Grandmother   . Breast cancer Daughter     Prior to Admission medications   Medication Sig Start Date End Date Taking? Authorizing Provider  acetaminophen (TYLENOL) 500 MG tablet Take 500 mg by mouth See admin instructions. Take one tablet (500 mg) by mouth every 6 hours - scheduled; may also take 1 tablet (500 mg) every 4 hours as needed for headache, minor discomfort and fever 99.5-101 F - do not exceed 2000 mg/24 hours   Yes [provider]  alum & mag hydroxide-simeth (MAALOX/MYLANTA) 200-200-20 MG/5ML suspension Take 30 mLs by mouth daily as needed for indigestion or heartburn.    Yes [provider]  atorvastatin (LIPITOR) 40 MG tablet Take 40 mg by mouth at bedtime.    Yes [provider]  carboxymethylcellulose (REFRESH PLUS) 0.5 % SOLN Place 1 drop into both eyes 4 (four) times daily.   Yes [provider]  carvedilol (COREG) 3.125 MG tablet Take 1 tablet (3.125 mg total) by mouth 2 (two) times daily with a meal. PLEASE START THE LOWER DOSE ON Monday, 7/30 DEPENDING ON HER HEART RATE. OK TO START IF HEART RATE IS NOT LESS THAN  65. Patient taking differently: Take 3.125 mg by mouth 2 (two) times daily with a meal.  10/07/16  Yes Bonnielee Haff, MD  Cholecalciferol (VITAMIN D) 50 MCG (2000 UT) tablet Take 2,000 Units by mouth daily.   Yes [provider]  Cranberry-Vitamin C-Probiotic (AZO CRANBERRY) 250-30 MG TABS Take 1 tablet by mouth daily.   Yes [provider]  digoxin (LANOXIN) 0.125 MG tablet Take 0.125 mg by mouth every other day. Hold if  pulse is less than 60   Yes [provider]  diltiazem (CARDIZEM CD) 120 MG 24 hr capsule Take 120 mg daily by mouth.   Yes [provider]  escitalopram (LEXAPRO) 10 MG tablet TAKE 1 TABLET(10 MG) BY MOUTH DAILY Patient taking differently: Take 10 mg by mouth daily.  09/29/17  Yes Marletta Lor, MD  furosemide (LASIX) 20 MG tablet Take 20 mg by mouth daily. 04/16/18  Yes [provider]  gabapentin (NEURONTIN) 100 MG capsule Take 100 mg by mouth 3 (three) times daily.    Yes [provider]  guaifenesin (ROBITUSSIN) 100 MG/5ML syrup Take 200 mg by mouth every 6 (six) hours as needed for cough.   Yes [provider]  loperamide (IMODIUM) 2 MG capsule Take 2 mg by mouth as needed for diarrhea or loose stools (do not exceed 8 doses in 24 hours).    Yes [provider]  magnesium hydroxide (MILK OF MAGNESIA) 400 MG/5ML suspension Take 30 mLs by mouth at bedtime as needed (constipation).    Yes [provider]  Melatonin 3 MG TABS Take 3 mg by mouth at bedtime.   Yes [provider]  methocarbamol (ROBAXIN) 500 MG tablet Take 500 mg by mouth 2 (two) times daily as needed for muscle spasms.   Yes [provider]  neomycin-bacitracin-polymyxin (NEOSPORIN) OINT Apply 1 application topically as needed (skin tears/abrasions).   Yes [provider]  ondansetron (ZOFRAN) 4 MG tablet Take 4 mg by mouth every 4 (four) hours as needed for nausea or vomiting.   Yes [provider]  oxymetazoline (AFRIN) 0.05 % nasal spray Place 1-2 sprays into both nostrils 3 (three) times daily as needed (nosebleeds).   Yes [provider]  Pramox-PE-Glycerin-Petrolatum (PREPARATION H) 1-0.25-14.4-15 % CREA Place 1 application rectally daily as needed (hemorrhoids).   Yes [provider]  Rivaroxaban (XARELTO) 15 MG TABS tablet Take 15 mg by mouth daily.   Yes [provider]  sennosides-docusate sodium (SENOKOT-S) 8.6-50 MG tablet Take 1 tablet by mouth daily. Patient taking differently: Take 1 tablet by mouth 2 (two) times daily.  05/28/16  Yes Marletta Lor, MD  sodium chloride (OCEAN) 0.65 % SOLN nasal spray Place 2 sprays into both nostrils 4 (four) times daily.   Yes [provider]  Sodium Fluoride (PREVIDENT 5000 BOOSTER PLUS) 1.1 % PSTE Place 1 application onto teeth See admin instructions. Brush/floss normally, then use a smear of prevident on your toothbrush twice daily; do not eat/drink/rinse/ or use mouthwash after use   Yes [provider]  traMADol (ULTRAM) 50 MG tablet Take 1 tablet (50 mg total) by mouth See admin instructions. Take 50 mg by mouth three times a day at 8:00 am, 14:00 and 22:00 Also take 50 mg by mouth every four hours as needed for sever pain not to exceed total 4/day Patient taking differently: Take 50 mg by mouth 4 (four) times daily.  10/05/16  Yes Bonnielee Haff, MD  traZODone (DESYREL) 150 MG tablet Take 1 tablet (150 mg total) by mouth at bedtime. 10/05/16  Yes Bonnielee Haff, MD  HYDROcodone-acetaminophen (NORCO/VICODIN) 5-325 MG tablet Take 2 tablets by mouth every 8 (eight) hours as needed. Patient not taking: Reported on 06/14/2019 06/13/19   Virgel Manifold, MD    Physical Exam: Vitals:   06/15/19 0400 06/15/19 0630 06/15/19 0700 06/15/19 0828  BP: (!) 177/95 (!) 179/91 (!) 180/99 (!) 150/109  Pulse: 97 90 (!) 58 (!) 113  Resp: 18 20 19    Temp:      TempSrc:      SpO2: 96% 95% 98%      Physical Exam  HENT:  Head: Normocephalic.  Nasal packing in right nare and dried blood noted on the upper lip.  No active oozing of blood. Sinuses nontender to palpation.  Eyes: Right eye exhibits no discharge. Left eye exhibits no discharge.  Cardiovascular: Intact distal pulses.  Irregularly irregular  rhythm  Pulmonary/Chest: Effort normal and breath sounds normal. No respiratory distress. She has no wheezes. She has no rales.  Abdominal: Soft. She exhibits no distension. There is abdominal tenderness. There is no rebound and no guarding.  Hyperactive bowel sounds  Musculoskeletal:        General: No edema.     Cervical back: Neck supple.  Neurological:  Awake and alert Oriented to person and place only  Skin: Skin is warm and dry. She is not diaphoretic.     Labs on Admission: I have personally reviewed following labs and imaging studies  CBC: Recent Labs  Lab 06/12/19 2142 06/14/19 1300 06/15/19 0332  WBC 10.4 20.3* 19.7*  NEUTROABS 7.3 18.1*  --   HGB 12.4 15.8* 13.8  HCT 39.3 49.1* 42.2  MCV 98.5 95.0 94.4  PLT 236 254 [id number]   Basic Metabolic Panel: Recent Labs  Lab 06/12/19 2142 06/14/19 1300  NA 139 138  K 4.4 3.9  CL 102 98  CO2 26 25  GLUCOSE 90 137*  BUN 20 20  CREATININE 1.17* 1.20*  CALCIUM 9.0 9.4   GFR: Estimated Creatinine Clearance: 35.1 mL/min (A) (by C-G formula based on SCr of 1.2 mg/dL (H)). Liver Function Tests: Recent Labs  Lab 06/14/19 1300  AST 23  ALT 14  ALKPHOS 58  BILITOT 1.2  PROT 7.7  ALBUMIN 3.7   No results for input(s): LIPASE, AMYLASE in the last 168 hours. No results for input(s): AMMONIA in the last 168 hours. Coagulation Profile: Recent Labs  Lab 06/14/19 1551  INR 2.3*   Cardiac Enzymes: No results for input(s): CKTOTAL, CKMB, CKMBINDEX, TROPONINI in the last 168 hours. BNP (last 3 results) No results for input(s): PROBNP in the last 8760 hours. HbA1C: No results for input(s): HGBA1C in the last 72  hours. CBG: No results for input(s): GLUCAP in the last 168 hours. Lipid Profile: No results for input(s): CHOL, HDL, LDLCALC, TRIG, CHOLHDL, LDLDIRECT in the last 72 hours. Thyroid Function Tests: No results for input(s): TSH, T4TOTAL, FREET4, T3FREE, THYROIDAB in the last 72 hours. Anemia Panel: No results for input(s): VITAMINB12, FOLATE, FERRITIN, TIBC, IRON, RETICCTPCT in the last 72 hours. Urine analysis:    Component Value Date/Time   COLORURINE YELLOW 06/14/2019 1850   APPEARANCEUR CLEAR 06/14/2019 1850   LABSPEC 1.016 06/14/2019 1850   PHURINE 6.0 06/14/2019 1850   GLUCOSEU NEGATIVE 06/14/2019 1850   HGBUR MODERATE (A) 06/14/2019 1850   BILIRUBINUR NEGATIVE 06/14/2019 1850   BILIRUBINUR neg 03/22/2016 1557   KETONESUR 5 (A) 06/14/2019 1850   PROTEINUR >=300 (A) 06/14/2019 1850   UROBILINOGEN 0.2 03/22/2016 1557   UROBILINOGEN 0.2 08/04/2014 0657   NITRITE NEGATIVE 06/14/2019 1850   LEUKOCYTESUR NEGATIVE 06/14/2019 1850    Radiological Exams on Admission: CT ABDOMEN PELVIS WO CONTRAST  Result Date: 06/15/2019 CLINICAL DATA:  Postoperative lower abdominal pain and fever. EXAM: CT ABDOMEN AND PELVIS WITHOUT CONTRAST TECHNIQUE: Multidetector CT imaging of the abdomen and pelvis was performed following the standard protocol without IV contrast. COMPARISON:  May 24, 2018 FINDINGS: Lower chest: Mild, stable linear scarring is seen within the left lung base. Hepatobiliary: No focal liver abnormality is seen. No gallstones, gallbladder wall thickening, or biliary dilatation. Pancreas: Unremarkable. No pancreatic ductal dilatation or surrounding inflammatory changes. Spleen: Normal in size without focal abnormality. Adrenals/Urinary Tract: Adrenal glands are unremarkable. Mild bilateral renal atrophy is seen, without renal calculi or hydronephrosis. A stable 1.2 cm cyst is seen within the posterior aspect of  the mid left kidney. Bladder is unremarkable. Stomach/Bowel: Stomach is within  normal limits. The appendix is poorly visualized. No evidence of bowel wall thickening, distention, or inflammatory changes. Noninflamed diverticula are seen within the sigmoid colon. Vascular/Lymphatic: Marked severity aortic calcification. No enlarged abdominal or pelvic lymph nodes. Reproductive: Status post hysterectomy. No adnexal masses. Other: No abdominal wall hernia or abnormality. No abdominopelvic ascites. Musculoskeletal: Multilevel degenerative changes are seen within the lumbar spine. IMPRESSION: 1. Sigmoid diverticulosis without evidence of acute diverticulitis. 2. Marked severity aortic calcification. Aortic Atherosclerosis (ICD10-I70.0). Electronically Signed   By: Virgina Norfolk M.D.   On: 06/15/2019 04:04   DG Tibia/Fibula Left  Result Date: 06/14/2019 CLINICAL DATA:  LEFT foot pain after fall. EXAM: LEFT TIBIA AND FIBULA - 2 VIEW COMPARISON:  None. FINDINGS: There is no evidence of fracture or other focal bone lesions. Soft tissues are unremarkable. IMPRESSION: No fracture of the tibia or fibula. Electronically Signed   By: Suzy Bouchard M.D.   On: 06/14/2019 12:46   DG Chest Port 1 View  Result Date: 06/14/2019 CLINICAL DATA:  Leukocytosis EXAM: PORTABLE CHEST 1 VIEW COMPARISON:  05/24/2018 FINDINGS: The heart size and mediastinal contours are within normal limits. Both lungs are clear. The visualized skeletal structures are unremarkable. IMPRESSION: No active disease. Electronically Signed   By: Kathreen Devoid   On: 06/14/2019 14:20   DG Foot Complete Left  Result Date: 06/14/2019 CLINICAL DATA:  Left foot pain. Fall. EXAM: LEFT FOOT - COMPLETE 3+ VIEW COMPARISON:  None FINDINGS: Osteopenia and hallux valgus deformity noted. There is an acute fracture involving the head of the fifth proximal phalanx. Mild lateral angulation of the distal fracture fragment. On the lateral radiograph there is a suspicious lucency which extends through the plantar aspect of the base of the first  proximal phalanx. Cannot rule out nondisplaced intra-articular fracture. No dislocations identified. IMPRESSION: 1. Acute fracture involves the head of the fifth proximal phalanx. 2. Cannot rule out nondisplaced intra-articular fracture involving the base of the first proximal phalanx. Correlation for any focal tenderness around the base of the first proximal phalanx. Electronically Signed   By: Kerby Moors M.D.   On: 06/14/2019 12:47    EKG: Independently reviewed.  A. fib with RVR, heart rate 134..  ST depressions in inferior and lateral leads appear new compared to prior tracing.  Assessment/Plan Principal Problem:   Sepsis [facility]) Active Problems:   Paroxysmal atrial fibrillation ([place])   Hypertensive urgency   Fall   Epistaxis   Sepsis from unknown source: Tachycardic, labs showing significant leukocytosis, and mild lactic acidosis.  Chest x-ray without evidence of pneumonia.  UA not suggestive of infection.  No meningeal signs.  Does have a nasal packing in place for epistaxis.  No signs of toxic shock syndrome.  No sinus pain/pressure or headaches to suggest rhinosinusitis.  Patient complained of abdominal pain but CT without acute finding/source of infection.  Received IV fluids.  Started on broad-spectrum antibiotics including vancomycin, cefepime, and metronidazole.  Trend lactate.  Blood cultures and urine culture pending.  Check procalcitonin level.  Paroxysmal atrial fibrillation: Rate was up to 130s initially but came down to 90s without any intervention.  Suspect related to underlying infection.  Hold home Xarelto at this time given epistaxis.  Continue Cardizem.  Hypertensive urgency: Blood pressure significantly elevated on arrival, now improved.  Continue home Cardizem.  As needed hydralazine ordered.  Mild troponin elevation/EKG changes: Suspect related to demand ischemia from hypertensive urgency and A.  fib.  EKG done initially showing A. fib with RVR and new ST depressions in  inferior and lateral leads. Troponin mildly elevated ( 24 >28 >31).  EKG changes could possibly be rate related, as such, repeat EKG has been ordered now that the patient's heart rate is improved, EKG currently pending.  Continue to trend troponin.  ACS less likely as patient is not complaining of chest pain.  Generalized weakness, fall: Suspect related to underlying infection.  No syncope or injuries reported by nursing home.  Order PT and OT evaluation.  Acute fracture of proximal phalanx: X-ray of left foot showing acute fracture involving the head of the fifth proximal phalanx.  Cannot rule out nondisplaced intra-articular fracture involving the base of the first proximal phalanx.  CAM Walker in place.  Consult podiatry in a.m.  Epistaxis: Patient was seen in the ED on 3/28.  She was treated with silver nitrate and nasal packing was applied.  Currently there is a lot of dried blood around the area but no active oozing of blood.  It seems she has had continued bleeding since she has been at home.  Will hold anticoagulation at this time.  Please consult ENT in a.m.  DVT prophylaxis: SCDs Code Status: DNR Family Communication: No family available at this time. Disposition Plan: Anticipate discharge after clinical improvement. Admission status: It is my clinical opinion that admission to INPATIENT is reasonable and necessary because of the expectation that this patient will require hospital care that crosses at least 2 midnights to treat this condition based on the medical complexity of the problems presented.  Given the aforementioned information, the predictability of an adverse outcome is felt to be significant.  The medical decision making on this patient was of high complexity and the patient is at high risk for clinical deterioration, therefore this is a level 3 visit.  Shela Leff MD [facility]  If 7PM-7AM, please contact night-coverage [email or web address]  06/15/2019, 8:30 AM

## 2019-06-15 NOTE — Evaluation (Signed)
Physical Therapy Evaluation Patient Details Name: Tracy Rodriguez MRN: [id number] DOB: [birth year] Today's Date: 06/15/2019   History of Present Illness  Pt is an 86 y.o. female admitted from [facility] on 06/14/19 after fall with L foot pain. L foot imaging showed acute L 5th proximal phalanx fx; CAM walker boot placed. Worked up for sepsis versus stress demargination. COVID-19 test negative. Of note, pt has had 2x ED visits this past week with epistaxis and has a nasal pack in her right nare. Other PMH includes dementia, chronic pain, arthritis, PAF on anticoagulation, CHF, bipolar disorder.    Clinical Impression  Pt presents with an overall decrease in functional mobility secondary to above. PTA, pt from [facility], uses RW for mobility with assist from staff. Pt A&Ox3 although poor historian and distracted by pain, asking repeatedly for pain medication although RN had just given. Currently c/o R scapular pain with limited R shoulder ROM. Pt with L foot fx in CAM walker boot, unclear WB orders, therefore assumed LLE NWB. Pt able to stand but unable to maintain precautions due to weakness and confusion. Pt would benefit from continued acute PT services to maximize functional mobility and independence prior to d/c with SNF-level therapies.     Follow Up Recommendations SNF;Supervision/Assistance - 24 hour    Equipment Recommendations  None recommended by PT    Recommendations for Other Services       Precautions / Restrictions Precautions Precautions: Fall Required Braces or Orthoses: Other Brace Other Brace: L CAM walker boot Restrictions Other Position/Activity Restrictions: No L ankle WB orders - assume LLE NWB secondary to acute fxs      Mobility  Bed Mobility Overal bed mobility: Needs Assistance Bed Mobility: Supine to Sit;Sit to Supine     Supine to sit: Min assist Sit to supine: Min assist   General bed mobility comments: MinA for UE support to elevate trunk;  minA to assist LLE back to supine  Transfers Overall transfer level: Needs assistance Equipment used: Rolling walker (2 wheeled) Transfers: Sit to/from Stand Sit to Stand: Min assist         General transfer comment: MinA to stand from stretcher to RW, pt unable to maintain LLE NWB despite assist; returned to sit secondary to this  Ambulation/Gait             General Gait Details: Not attempted  Stairs            Wheelchair Mobility    Modified Rankin (Stroke Patients Only)       Balance Overall balance assessment: Needs assistance   Sitting balance-Leahy Scale: Fair       Standing balance-Leahy Scale: Poor Standing balance comment: Reliant on BUE support, unable to maintain LLE NWB                             Pertinent Vitals/Pain Pain Assessment: Faces Faces Pain Scale: Hurts little more Pain Location: R shoulder Pain Descriptors / Indicators: Grimacing;Guarding Pain Intervention(s): Monitored during session;RN gave pain meds during session;Repositioned    Home Living Family/patient expects to be discharged to:: Skilled nursing facility                 Additional Comments: Pt from [facility] SNF    Prior Function Level of Independence: Needs assistance   Gait / Transfers Assistance Needed: Ambulatory with RW  ADL's / Homemaking Assistance Needed: Pt poor historian - reports independent with  ADLs, likely requires assist/supervision from staff for bathing/toileting        Hand Dominance        Extremity/Trunk Assessment   Upper Extremity Assessment Upper Extremity Assessment: RUE deficits/detail;Generalized weakness RUE Deficits / Details: C/o R shoulder pain (posterior mid-scap with palpation), flex/abd limited to ~90' with limited scapular mobility    Lower Extremity Assessment Lower Extremity Assessment: Generalized weakness;LLE deficits/detail LLE Deficits / Details: acute L phalanx fxs; CAM boot removed with  significant swelling/brusing to distal foot and toes; hip and knee strength at least 3/5 throughout; pt denies pain until lower foot palpated       Communication   Communication: HOH  Cognition Arousal/Alertness: Awake/alert Behavior During Therapy: Anxious Overall Cognitive Status: History of cognitive impairments - at baseline                                 General Comments: H/o dementia. Pt asked at least 10x every ~1 minute "I'm hurting... can you get me some pain medicine?". A&O to self, location and situation; stating March 2020, reoriented to current date. Following majority of simple commands and answering questions - although limited by poor attention and distracted by pain      General Comments      Exercises     Assessment/Plan    PT Assessment Patient needs continued PT services  PT Problem List Decreased strength;Decreased activity tolerance;Decreased balance;Decreased mobility;Decreased cognition;Decreased knowledge of use of DME;Decreased safety awareness;Decreased knowledge of precautions;Pain       PT Treatment Interventions DME instruction;Gait training;Functional mobility training;Therapeutic activities;Therapeutic exercise;Balance training;Patient/family education;Cognitive remediation    PT Goals (Current goals can be found in the Care Plan section)  Acute Rehab PT Goals Patient Stated Goal: "Can I get some pain medication?" PT Goal Formulation: With patient Time For Goal Achievement: 06/29/19 Potential to Achieve Goals: Fair    Frequency Min 3X/week   Barriers to discharge        Co-evaluation               AM-PAC PT "6 Clicks" Mobility  Outcome Measure Help needed turning from your back to your side while in a flat bed without using bedrails?: A Little Help needed moving from lying on your back to sitting on the side of a flat bed without using bedrails?: A Little Help needed moving to and from a bed to a chair (including a  wheelchair)?: A Little Help needed standing up from a chair using your arms (e.g., wheelchair or bedside chair)?: A Lot Help needed to walk in hospital room?: A Lot Help needed climbing 3-5 steps with a railing? : A Lot 6 Click Score: 15    End of Session   Activity Tolerance: Patient limited by pain Patient left: in bed;with call bell/phone within reach Nurse Communication: Mobility status PT Visit Diagnosis: Other abnormalities of gait and mobility (R26.89);Muscle weakness (generalized) (M62.81)    Time: [id number] PT Time Calculation (min) (ACUTE ONLY): 20 min   Charges:   PT Evaluation $PT Eval Moderate Complexity: [place], PT, DPT Acute Rehabilitation Services  Pager [phone] Office [place] 06/15/2019, 11:35 AM

## 2019-06-15 NOTE — Progress Notes (Signed)
Pharmacy Antibiotic Note  Tracy Rodriguez is a 86 y.o. female admitted on 06/14/2019 with sepsis.  Pharmacy has been consulted for Vancomycin and Cefepime dosing. Pt also on Flagyl. Pt received Vanc 1.5gm and Ceftriaxone 1gm in ED  Plan: Cefepime 2gm IV q12h Vancomycin 1250 mg IV Q 36 hrs. Goal AUC 400-550. Expected AUC: 500 SCr used: 1.2 Will f/u renal function, micro data, and pt's clinical condition Vanc levels prn      Temp (24hrs), Avg:98.3 F (36.8 C), Min:98.3 F (36.8 C), Max:98.3 F (36.8 C)  Recent Labs  Lab 06/12/19 2142 06/14/19 1300 06/14/19 1401 06/14/19 1613  WBC 10.4 20.3*  --   --   CREATININE 1.17* 1.20*  --   --   LATICACIDVEN  --   --  2.1* 2.1*    Estimated Creatinine Clearance: 35.1 mL/min (A) (by C-G formula based on SCr of 1.2 mg/dL (H)).    Allergies  Allergen Reactions  . Codeine Anaphylaxis and Nausea And Vomiting    Note: pt has tramadol for years with no reaction  . Morphine And Related Nausea And Vomiting    Severe nausea per daughter  . Valium [Diazepam] Shortness Of Breath  . Sulfa Antibiotics Itching and Rash  . Darvon [Propoxyphene] Nausea And Vomiting  . Floxin [Ofloxacin] Itching  . Levsin [Hyoscyamine Sulfate] Other (See Comments)    Unknown reaction  . Penicillins Itching    Itching reported by [facility] Has patient had a PCN reaction causing immediate rash, facial/tongue/throat swelling, SOB or lightheadedness with hypotension: Yes Has patient had a PCN reaction causing severe rash involving mucus membranes or skin necrosis: No Has patient had a PCN reaction that required hospitalization No Has patient had a PCN reaction occurring within the last 10 years: No If all of the above answers are "NO", then may proceed with Cephalosporin use.   . Zantac [Ranitidine Hcl] Other (See Comments)    Unknown reaction    Antimicrobials this admission: 4/5 Ceftriaxone x 1 4/5 Vanc >>  4/6 Cefepime >>  4/6  Flagyl >>  Microbiology results: 4/5 BCx:   UCx:   Thank you for allowing pharmacy to be a part of this patient's care.  Sherlon Handing, PharmD, BCPS Please see amion for complete clinical pharmacist phone list 06/15/2019 2:49 AM

## 2019-06-16 DIAGNOSIS — A419 Sepsis, unspecified organism: Secondary | ICD-10-CM

## 2019-06-16 DIAGNOSIS — R040 Epistaxis: Secondary | ICD-10-CM

## 2019-06-16 DIAGNOSIS — I160 Hypertensive urgency: Secondary | ICD-10-CM

## 2019-06-16 DIAGNOSIS — I480 Paroxysmal atrial fibrillation: Principal | ICD-10-CM

## 2019-06-16 LAB — CBC
HCT: 35.4 % — ABNORMAL LOW (ref 36.0–46.0)
Hemoglobin: 11.6 g/dL — ABNORMAL LOW (ref 12.0–15.0)
MCH: 31.3 pg (ref 26.0–34.0)
MCHC: 32.8 g/dL (ref 30.0–36.0)
MCV: 95.4 fL (ref 80.0–100.0)
Platelets: 203 10*3/uL (ref 150–400)
RBC: 3.71 MIL/uL — ABNORMAL LOW (ref 3.87–5.11)
RDW: 13.3 % (ref 11.5–15.5)
WBC: 14.5 10*3/uL — ABNORMAL HIGH (ref 4.0–10.5)
nRBC: 0 % (ref 0.0–0.2)

## 2019-06-16 LAB — URINE CULTURE: Culture: <10000 — AB

## 2019-06-16 LAB — BASIC METABOLIC PANEL
Anion gap: 12 (ref 5–15)
BUN: 29 mg/dL — ABNORMAL HIGH (ref 8–23)
CO2: 24 mmol/L (ref 22–32)
Calcium: 8.6 mg/dL — ABNORMAL LOW (ref 8.9–10.3)
Chloride: 102 mmol/L (ref 98–111)
Creatinine, Ser: 1.37 mg/dL — ABNORMAL HIGH (ref 0.44–1.00)
GFR calc Af Amer: 40 mL/min — ABNORMAL LOW (ref >60)
GFR calc non Af Amer: 35 mL/min — ABNORMAL LOW (ref >60)
Glucose, Bld: 105 mg/dL — ABNORMAL HIGH (ref 70–99)
Potassium: 3.1 mmol/L — ABNORMAL LOW (ref 3.5–5.1)
Sodium: 138 mmol/L (ref 135–145)

## 2019-06-16 LAB — PROCALCITONIN: Procalcitonin: 0.13 ng/mL

## 2019-06-16 LAB — LACTIC ACID, PLASMA: Lactic Acid, Venous: 1.9 mmol/L (ref 0.5–1.9)

## 2019-06-16 LAB — MAGNESIUM: Magnesium: 2.1 mg/dL (ref 1.7–2.4)

## 2019-06-16 MED ORDER — POTASSIUM CHLORIDE CRYS ER 20 MEQ PO TBCR
40.0000 meq | EXTENDED_RELEASE_TABLET | Freq: Once | ORAL | Status: AC
  Administered 2019-06-16: 40 meq via ORAL
  Filled 2019-06-16: qty 2

## 2019-06-16 NOTE — TOC Initial Note (Signed)
Transition of Care [facility]) - Initial/Assessment Note    Patient Details  Name: Tracy Rodriguez MRN: [id number] Date of Birth: 10/23/1931  [facility]) CM/SW Contact:    Alberteen Sam, LCSW Phone Number: 06/16/2019, 10:30 AM  Clinical Narrative:                  CSW spoke with patient's daughter [name] regarding SNF recommendation, as patient is from [facility]. She reports she's in agreement with this and that patient has been to two SNF in the past, however she does not remember the name. She recommends CSW speak with [facility] regarding discharge planning and SNF choice.  CSW has lvm with patient's daughter Eustaquio Maize pending call back at this time.   Expected Discharge Plan: Skilled Nursing Facility Barriers to Discharge: Continued Medical Work up   Patient Goals and CMS Choice   CMS Medicare.gov Compare Post Acute Care list provided to:: Patient Represenative (must comment)(daughter [name]) Choice offered to / list presented to : Adult Children  Expected Discharge Plan and Services Expected Discharge Plan: [place] Acute Care Choice: [place] arrangements for the past 2 months: Assisted Living Facility([name])                                      Prior Living Arrangements/Services Living arrangements for the past 2 months: Assisted Living Facility([name]) Lives with:: Self   Do you feel safe going back to the place where you live?: No   needs short term rehab  Need for Family Participation in Patient Care: No (Comment) Care giver support system in place?: Yes (comment)   Criminal Activity/Legal Involvement Pertinent to Current Situation/Hospitalization: Yes - Comment as needed  Activities of Daily Living Home Assistive Devices/Equipment: Walker (specify type) ADL Screening (condition at time of admission) Patient's cognitive ability adequate to safely complete daily activities?: No Is the  patient deaf or have difficulty hearing?: No Does the patient have difficulty seeing, even when wearing glasses/contacts?: No Does the patient have difficulty concentrating, remembering, or making decisions?: Yes Patient able to express need for assistance with ADLs?: Yes Does the patient have difficulty dressing or bathing?: Yes Independently performs ADLs?: No Communication: Appropriate for developmental age Dressing (OT): Needs assistance Is this a change from baseline?: Pre-admission baseline Grooming: Needs assistance Is this a change from baseline?: Pre-admission baseline Feeding: Needs assistance Is this a change from baseline?: Pre-admission baseline Bathing: Needs assistance Is this a change from baseline?: Pre-admission baseline Toileting: Needs assistance Is this a change from baseline?: Pre-admission baseline In/Out Bed: Needs assistance Is this a change from baseline?: Pre-admission baseline Walks in Home: Needs assistance Is this a change from baseline?: Pre-admission baseline Does the patient have difficulty walking or climbing stairs?: Yes Weakness of Legs: Both Weakness of Arms/Hands: Both  Permission Sought/Granted Permission sought to share information with : Case Manager, Customer service manager, Family Supports Permission granted to share information with : Yes, Verbal Permission Granted  Share Information with NAME: [name]  Permission granted to share info w AGENCY: SNFs  Permission granted to share info w Relationship: daughter  Permission granted to share info w Contact Information: [phone]  Emotional Assessment Appearance:: Appears stated age     Orientation: : Oriented to Self, Oriented to  Time Alcohol / Substance Use: Not Applicable Psych Involvement: No (comment)  Admission diagnosis:  Sepsis ([place]) [A41.9] Closed nondisplaced fracture of fifth metatarsal bone of left foot, initial encounter [S92.355A] Patient Active Problem List    Diagnosis Date Noted  . Epistaxis 06/15/2019  . Sepsis ([place]) 06/14/2019  . Functional dyspnea (2/2 posture/positioning w/ abnormal sprirometry) 10/03/2016  . Bipolar 1 disorder ([place]) 10/03/2016  . PAF (paroxysmal atrial fibrillation) ([place]) 10/03/2016  . Hypertension 10/03/2016  . Orthostatic syncope 10/03/2016  . Diarrhea 10/03/2016  . Chronic diastolic heart failure ([place]) 10/03/2016  . Supratherapeutic INR 10/03/2016  . Dementia ([place]) 10/03/2016  . Hyperlipemia 10/03/2016  . CKD (chronic kidney disease) stage 3, GFR 30-59 ml/min 10/03/2016  . Symptomatic bradycardia   . Dyspnea 09/16/2016  . CHF (congestive heart failure) ([place]) 03/04/2016  . Acute renal failure superimposed on stage 3 chronic kidney disease ([place]) 02/08/2016  . Fall   . Syncope 12/05/2015  . Physical deconditioning 12/05/2015  . Encounter for therapeutic drug monitoring 12/22/2014  . Atrial fibrillation with RVR ([place]) 08/04/2014  . Paroxysmal atrial fibrillation ([place]) 08/04/2014  . Hypertensive urgency   . Obesity 09/27/2013  . Chronic back pain 09/14/2012  . Bipolar disorder ([place]) 09/14/2012   PCP:  System, Pcp Not In Pharmacy:  No Pharmacies Listed    Social Determinants of Health (SDOH) Interventions    Readmission Risk Interventions No flowsheet data found.

## 2019-06-16 NOTE — Evaluation (Signed)
Occupational Therapy Evaluation Patient Details Name: Tracy Rodriguez MRN: [id number] DOB: [birth year] Today's Date: 06/16/2019    History of Present Illness Pt is an 86 y.o. female admitted from [facility] on 06/14/19 after fall with L foot pain. L foot imaging showed acute L 5th proximal phalanx fx; CAM walker boot placed. Worked up for sepsis versus stress demargination. COVID-19 test negative. Of note, pt has had 2x ED visits this past week with epistaxis and has a nasal pack in her right nare. Other PMH includes dementia, chronic pain, arthritis, PAF on anticoagulation, CHF, bipolar disorder.   Clinical Impression   Pt PTA: Pt from SNF s/p recent fall. Pt currently modA for transfers and maxA for ADL tasks in standing with poor activity tolerance. Pt with dementia at baseline and requires max cues to complete tasks. O2>90% on 2L with exertion. Pt's HR increased to 150s, but mostly remained  90-135 BPM. Pt would benefit from continued OT skilled services for ADL, mobility and safety. OT following acutely.    Follow Up Recommendations  SNF    Equipment Recommendations  Other (comment)(TBD)    Recommendations for Other Services       Precautions / Restrictions Precautions Precautions: Fall;Other (comment) Precaution Comments: O2 Required Braces or Orthoses: Other Brace Other Brace: L CAM walker boot Restrictions Weight Bearing Restrictions: Yes LLE Weight Bearing: Weight bearing as tolerated      Mobility Bed Mobility Overal bed mobility: Needs Assistance Bed Mobility: Supine to Sit     Supine to sit: Mod assist;HOB elevated     General bed mobility comments: Pt requiring cues to sit upright as pt would lean posteriorly and drop head down.  Transfers Overall transfer level: Needs assistance Equipment used: Rolling walker (2 wheeled) Transfers: Sit to/from Stand Sit to Stand: Mod assist         General transfer comment: WBAT, constant cueing for hand placement  and to continue to take steps     Balance Overall balance assessment: Needs assistance Sitting-balance support: Feet supported Sitting balance-Leahy Scale: Poor Sitting balance - Comments: assist for balance due to weakness and flexed posture     Standing balance-Leahy Scale: Poor Standing balance comment: bilat UE support and assist for balance                           ADL either performed or assessed with clinical judgement   ADL Overall ADL's : Needs assistance/impaired Eating/Feeding: Supervision/ safety   Grooming: Minimal assistance;Cueing for safety;Sitting   Upper Body Bathing: Supervision/ safety   Lower Body Bathing: Maximal assistance;Sitting/lateral leans;Sit to/from stand;+2 for safety/equipment   Upper Body Dressing : Minimal assistance;Sitting   Lower Body Dressing: Maximal assistance;Sitting/lateral leans;Sit to/from stand;+2 for safety/equipment   Toilet Transfer: Moderate assistance;Stand-pivot;BSC   Toileting- Clothing Manipulation and Hygiene: Maximal assistance;+2 for safety/equipment;Sitting/lateral lean;Sit to/from stand       Functional mobility during ADLs: Moderate assistance;Cueing for safety General ADL Comments: Pt with decreased ability to care for self, decreased strength and increased discomfort with mobility/movement. Session focused on toilet hygiene and LB ADL tasks.     Vision Baseline Vision/History: No visual deficits Patient Visual Report: No change from baseline Vision Assessment?: No apparent visual deficits     Perception     Praxis      Pertinent Vitals/Pain Pain Assessment: Faces Faces Pain Scale: Hurts little more Pain Location: R shoulder, L toe Pain Descriptors / Indicators: Grimacing;Guarding Pain Intervention(s): Monitored during session  Hand Dominance Right   Extremity/Trunk Assessment Upper Extremity Assessment Upper Extremity Assessment: Generalized weakness RUE Deficits / Details: R  shoulder pain; AROM to 90* FF and ~80* for abduction; soreness with movement and scapular rotation   Lower Extremity Assessment Lower Extremity Assessment: Generalized weakness LLE Deficits / Details: acute L phalanx fxs; CAM boot removed with significant swelling/brusing to distal foot and toes; hip and knee strength at least 3/5 throughout; pt denies pain until lower foot palpated       Communication Communication Communication: HOH   Cognition Arousal/Alertness: Awake/alert Behavior During Therapy: Flat affect Overall Cognitive Status: History of cognitive impairments - at baseline                                 General Comments: h/o dementia, easily anxious   General Comments  Pt's HR increased to 150s, but mostly remained  90-135 BPM.    Exercises     Shoulder Instructions      Home Living Family/patient expects to be discharged to:: Skilled nursing facility                                 Additional Comments: Pt from [facility]      Prior Functioning/Environment Level of Independence: Needs assistance  Gait / Transfers Assistance Needed: Ambulatory with RW ADL's / Homemaking Assistance Needed: Pt poor historian - reports independent with ADLs, likely requires assist/supervision from staff for bathing/toileting            OT Problem List: Decreased strength;Decreased activity tolerance;Impaired balance (sitting and/or standing);Decreased safety awareness;Decreased knowledge of use of DME or AE;Decreased knowledge of precautions;Pain;Increased edema;Decreased range of motion      OT Treatment/Interventions: Self-care/ADL training;Therapeutic exercise;Energy conservation;DME and/or AE instruction;Therapeutic activities;Patient/family education;Balance training    OT Goals(Current goals can be found in the care plan section) Acute Rehab OT Goals Patient Stated Goal: "I need to go to the bathroom," OT Goal Formulation: With  patient Time For Goal Achievement: 06/30/19 Potential to Achieve Goals: Good ADL Goals Pt Will Perform Lower Body Dressing: sit to/from stand;with min assist Pt Will Transfer to Toilet: with min guard assist;ambulating;bedside commode Pt/caregiver will Perform Home Exercise Program: Increased strength;Both right and left upper extremity;With Supervision Additional ADL Goal #1: Pt will increase to standing x5 mins for ADL tasks with minguardA to increase activity tolerance.  OT Frequency: Min 2X/week   Barriers to D/C:            Co-evaluation              AM-PAC OT "6 Clicks" Daily Activity     Outcome Measure Help from another person eating meals?: A Little Help from another person taking care of personal grooming?: A Lot Help from another person toileting, which includes using toliet, bedpan, or urinal?: A Lot Help from another person bathing (including washing, rinsing, drying)?: A Lot Help from another person to put on and taking off regular upper body clothing?: A Lot Help from another person to put on and taking off regular lower body clothing?: A Lot 6 Click Score: 13   End of Session Equipment Utilized During Treatment: Rolling walker;Gait belt;Oxygen Nurse Communication: Mobility status  Activity Tolerance: Patient tolerated treatment well;Treatment limited secondary to medical complications (Comment) Patient left: in bed;with call bell/phone within reach;with bed alarm set  OT Visit Diagnosis: Muscle weakness (  generalized) (M62.81);Pain;History of falling (Z91.81) Pain - Right/Left: Left Pain - part of body: Ankle and joints of foot                Time: [id number] OT Time Calculation (min): 31 min Charges:  OT General Charges $OT Visit: 1 Visit OT Evaluation $OT Eval Moderate Complexity: 1 Mod OT Treatments $Self Care/Home Management : 8-22 mins  Jefferey Pica, OTR/L Acute Rehabilitation Services Pager: [phone] Office: [phone]   Arianny Pun  C 06/16/2019, 3:30 PM

## 2019-06-16 NOTE — Progress Notes (Signed)
Physical Therapy Treatment Patient Details Name: Tracy Rodriguez MRN: [id number] DOB: 13-Aug-1931 Today's Date: 06/16/2019    History of Present Illness Pt is an 86 y.o. female admitted from [facility] on 06/14/19 after fall with L foot pain. L foot imaging showed acute L 5th proximal phalanx fx; CAM walker boot placed. Worked up for sepsis versus stress demargination. COVID-19 test negative. Of note, pt has had 2x ED visits this past week with epistaxis and has a nasal pack in her right nare. Other PMH includes dementia, chronic pain, arthritis, PAF on anticoagulation, CHF, bipolar disorder.    PT Comments    Patient progressing to some ambulation despite rather weak and with 14 point drop in SBP in standing vs sitting.  She reports, though has dementia, she may have passed out when she fell and hurt her foot.  Feel she continues to need skilled PT in the acute setting and follow up SNF level rehab at d/c.  Follow Up Recommendations  SNF;Supervision/Assistance - 24 hour     Equipment Recommendations  None recommended by PT    Recommendations for Other Services       Precautions / Restrictions Precautions Precautions: Fall Precaution Comments: O2 Required Braces or Orthoses: Other Brace Other Brace: L CAM walker boot Restrictions Weight Bearing Restrictions: Yes LLE Weight Bearing: Weight bearing as tolerated    Mobility  Bed Mobility Overal bed mobility: Needs Assistance Bed Mobility: Supine to Sit;Sit to Supine     Supine to sit: Mod assist;HOB elevated Sit to supine: Min assist;+2 for safety/equipment   General bed mobility comments: assist to initiate bringing legs off bed, assist for balance EOB as pt with flexed posture and dropping head, to supine assist for legs and trunk for safety  Transfers Overall transfer level: Needs assistance Equipment used: Rolling walker (2 wheeled) Transfers: Sit to/from Stand Sit to Stand: Min assist;+2 safety/equipment;From  elevated surface         General transfer comment: allowed WBAT and cues for posture, and participation as reported feeling very weak  Ambulation/Gait Ambulation/Gait assistance: Min assist;+2 safety/equipment Gait Distance (Feet): 4 Feet(forward then backwards) Assistive device: Rolling walker (2 wheeled) Gait Pattern/deviations: Step-to pattern;Decreased stride length;Shuffle;Trunk flexed;Wide base of support     General Gait Details: very weak and not wanting to walk so walked forward several steps, max cues and increased time stepping back to bed with pt wanting to sit prior to close enough to bed   Stairs             Wheelchair Mobility    Modified Rankin (Stroke Patients Only)       Balance Overall balance assessment: Needs assistance Sitting-balance support: Feet supported Sitting balance-Leahy Scale: Poor Sitting balance - Comments: assist for balance due to weakness and flexed posture     Standing balance-Leahy Scale: Poor Standing balance comment: bilat UE support and assist for balance                            Cognition Arousal/Alertness: Awake/alert Behavior During Therapy: Flat affect Overall Cognitive Status: No family/caregiver present to determine baseline cognitive functioning                                 General Comments: h/o dementia, very limited verbal responses this session, did state she thought she passed out when she fell      Exercises  General Comments General comments (skin integrity, edema, etc.): noted BP 114/74 sitting and 100/53 after walking few steps forward and back to bed with pt wiped out      Pertinent Vitals/Pain Pain Assessment: Faces Faces Pain Scale: Hurts little more Pain Location: L foot Pain Descriptors / Indicators: Grimacing;Guarding Pain Intervention(s): Monitored during session;Repositioned;Limited activity within patient's tolerance    Home Living Family/patient  expects to be discharged to:: Skilled nursing facility               Additional Comments: [facility]    Prior Function Level of Independence: Needs assistance  Gait / Transfers Assistance Needed: Ambulatory with RW ADL's / Homemaking Assistance Needed: Pt poor historian - reports independent with ADLs, likely requires assist/supervision from staff for bathing/toileting     PT Goals (current goals can now be found in the care plan section) Progress towards PT goals: Progressing toward goals    Frequency           PT Plan Current plan remains appropriate    Co-evaluation              AM-PAC PT "6 Clicks" Mobility   Outcome Measure  Help needed turning from your back to your side while in a flat bed without using bedrails?: A Little Help needed moving from lying on your back to sitting on the side of a flat bed without using bedrails?: A Lot Help needed moving to and from a bed to a chair (including a wheelchair)?: A Lot Help needed standing up from a chair using your arms (e.g., wheelchair or bedside chair)?: A Lot Help needed to walk in hospital room?: A Lot Help needed climbing 3-5 steps with a railing? : Total 6 Click Score: 12    End of Session   Activity Tolerance: Patient limited by fatigue Patient left: in bed;with call bell/phone within reach;with bed alarm set   PT Visit Diagnosis: Other abnormalities of gait and mobility (R26.89);Muscle weakness (generalized) (M62.81)     Time: [id number] PT Time Calculation (min) (ACUTE ONLY): 27 min  Charges:  $Gait Training: 8-22 mins $Therapeutic Activity: 8-22 mins                     Magda Kiel, [place] Cylinder [phone] 06/16/2019    Reginia Naas 06/16/2019, 10:57 AM

## 2019-06-16 NOTE — Progress Notes (Signed)
PROGRESS NOTE    Irania Rodriguez  [id number] DOB: 09/07/1931 DOA: 06/14/2019 PCP: System, Pcp Not In   Brief Narrative: 86 year old lady with prior history of dementia paroxysmal atrial fibrillation, bipolar disorder, chronic diastolic heart failure, epistaxis has been to ED in the past 1 week twice for recurrent epistaxis presents this admission for a fall and acute fracture involving head of the fifth proximal lungs.  Assessment & Plan:   Principal Problem:   Sepsis [facility]) Active Problems:   Paroxysmal atrial fibrillation ([place])   Hypertensive urgency   Fall   Epistaxis   Sepsis ruled out.  Patient on admission was tachycardic leukocytosis with lactic acidosis.  Chest x-ray is negative for pneumonia and urine analysis negative for infection.  Patient is alert and answering questions appropriately.  Nasal pack in place for epistaxis no signs of toxic shock syndrome.  Gentle hydration and initiate started on broad-spectrum IV antibiotics.  So far blood cultures have been negative.  If cultures remain negative by tomorrow morning we will transition to oral Augmentin to complete the course.   Paroxysmal atrial fibrillation Anticoagulation is on hold for epistaxis Rate control with Cardizem.    Hypertensive urgency Resolved   Mild troponin elevation probably secondary to tachycardia and demand ischemia from hypertensive urgency and atrial fibrillation. Patient denies any chest pain .   Generalized weakness leading to a fall PT OT evaluation recommending SNF.   Acute fracture of the proximal phalanx of the fifth digit CAM Walker in place, recommend outpatient follow-up with podiatry.  Recurrent epistaxis Was treated with silver nitrate and nasal packing on the right nostril We will get in touch with Dr. Redmond Baseman in the morning for further management.    DVT prophylaxis: SCDs Code Status: DNR Family Communication: Family at bedside Disposition Plan:   Patient came  from: SNF            Anticipated d/c place: SNF Barriers to d/c OR conditions which need to be met to effect a safe d/c: IV antibiotics, evaluation for sepsis  Consultants:   None  Procedures: None Antimicrobials: Vancomycin and cefepime since admission  Subjective: Patient denies any complaints at this time.  Objective: Vitals:   06/15/19 1957 06/15/19 2321 06/16/19 0346 06/16/19 0739  BP: (!) 149/82 115/70 (!) 113/57 (!) 145/57  Pulse: 71 87 62   Resp: (!) 21 (!) _0 Temp: (!) 97.4 F (36.3 C) 97.6 F (36.4 C) 98.2 F (36.8 C) (!) 97.5 F (36.4 C)  TempSrc: Oral Oral Oral Oral  SpO2: 96% 96% 91%   Weight:      Height:        Intake/Output Summary (Last 24 hours) at 06/16/2019 1734 Last data filed at 06/16/2019 1215 Gross per 24 hour  Intake 418.47 ml  Output --  Net 418.47 ml   Filed Weights   06/15/19 1621  Weight: 75.7 kg    Examination:  General exam: Ill-appearing elderly lady with nasal pack in the right nostril Respiratory system: Clear to auscultation. Respiratory effort normal. Cardiovascular system: S1 & S2 heard, RRR.Marland Kitchen No pedal edema. Gastrointestinal system: Abdomen is nondistended, soft and nontender.Normal bowel sounds heard. Central nervous system: Alert and oriented to person only  extremities: Symmetric 5 x 5 power. Skin: No rashes, lesions or ulcers Psychiatry:  Mood & affect appropriate.     Data Reviewed: I have personally reviewed following labs and imaging studies  CBC: Recent Labs  Lab 06/12/19 2142 06/14/19 1300 06/15/19 0332 06/16/19 0245  WBC 10.4 20.3* 19.7* 14.5*  NEUTROABS 7.3 18.1*  --   --   HGB 12.4 15.8* 13.8 11.6*  HCT 39.3 49.1* 42.2 35.4*  MCV 98.5 95.0 94.4 95.4  PLT 236 254 247 384   Basic Metabolic Panel: Recent Labs  Lab 06/12/19 2142 06/14/19 1300 06/16/19 0245 06/16/19 1459  NA 139 138 138  --   K 4.4 3.9 3.1*  --   CL 102 98 102  --   CO2 _0 --   GLUCOSE 90 137* 105*  --   BUN 20  20 29*  --   CREATININE 1.17* 1.20* 1.37*  --   CALCIUM 9.0 9.4 8.6*  --   MG  --   --   --  2.1   GFR: Estimated Creatinine Clearance: 31 mL/min (A) (by C-G formula based on SCr of 1.37 mg/dL (H)). Liver Function Tests: Recent Labs  Lab 06/14/19 1300  AST 23  ALT 14  ALKPHOS 58  BILITOT 1.2  PROT 7.7  ALBUMIN 3.7   No results for input(s): LIPASE, AMYLASE in the last 168 hours. No results for input(s): AMMONIA in the last 168 hours. Coagulation Profile: Recent Labs  Lab 06/14/19 1551  INR 2.3*   Cardiac Enzymes: No results for input(s): CKTOTAL, CKMB, CKMBINDEX, TROPONINI in the last 168 hours. BNP (last 3 results) No results for input(s): PROBNP in the last 8760 hours. HbA1C: No results for input(s): HGBA1C in the last 72 hours. CBG: No results for input(s): GLUCAP in the last 168 hours. Lipid Profile: No results for input(s): CHOL, HDL, LDLCALC, TRIG, CHOLHDL, LDLDIRECT in the last 72 hours. Thyroid Function Tests: No results for input(s): TSH, T4TOTAL, FREET4, T3FREE, THYROIDAB in the last 72 hours. Anemia Panel: No results for input(s): VITAMINB12, FOLATE, FERRITIN, TIBC, IRON, RETICCTPCT in the last 72 hours. Sepsis Labs: Recent Labs  Lab 06/14/19 1401 06/14/19 1613 06/15/19 0332 06/16/19 0245 06/16/19 1459  PROCALCITON  --   --  <0.10 0.13  --   LATICACIDVEN 2.1* 2.1* 2.0*  --  1.9    Recent Results (from the past 240 hour(s))  Blood culture (routine x 2)     Status: None (Preliminary result)   Collection Time: 06/14/19  3:42 PM   Specimen: BLOOD  Result Value Ref Range Status   Specimen Description BLOOD RIGHT ANTECUBITAL  Final   Special Requests   Final    BOTTLES DRAWN AEROBIC AND ANAEROBIC Blood Culture results may not be optimal due to an inadequate volume of blood received in culture bottles   Culture   Final    NO GROWTH 2 DAYS Performed at [facility] [address], [place], [place] [address]    Report Status PENDING  Incomplete   Blood culture (routine x 2)     Status: None (Preliminary result)   Collection Time: 06/14/19  3:47 PM   Specimen: BLOOD LEFT HAND  Result Value Ref Range Status   Specimen Description BLOOD LEFT HAND  Final   Special Requests   Final    BOTTLES DRAWN AEROBIC ONLY Blood Culture results may not be optimal due to an inadequate volume of blood received in culture bottles   Culture   Final    NO GROWTH 2 DAYS Performed at [facility], [place] [address], , [place] [address]    Report Status PENDING  Incomplete  SARS CORONAVIRUS 2 (TAT 6-24 HRS) Nasopharyngeal Nasopharyngeal Swab     Status: None  Collection Time: 06/14/19  4:02 PM   Specimen: Nasopharyngeal Swab  Result Value Ref Range Status   SARS Coronavirus 2 NEGATIVE NEGATIVE Final    Comment: (NOTE) SARS-CoV-2 target nucleic acids are NOT DETECTED. The SARS-CoV-2 RNA is generally detectable in upper and lower respiratory specimens during the acute phase of infection. Negative results do not preclude SARS-CoV-2 infection, do not rule out co-infections with other pathogens, and should not be used as the sole basis for treatment or other patient management decisions. Negative results must be combined with clinical observations, patient history, and epidemiological information. The expected result is Negative. Fact Sheet for Patients: SugarRoll.be Fact Sheet for Healthcare Providers: https://www.woods-mathews.com/ This test is not yet approved or cleared by the [place] FDA and  has been authorized for detection and/or diagnosis of SARS-CoV-2 by FDA under an Emergency Use Authorization (EUA). This EUA will remain  in effect (meaning this test can be used) for the duration of the COVID-19 declaration under Section 56 4(b)(1) of the Act, 21 U.S.C. section 360bbb-3(b)(1), unless the authorization is terminated or revoked sooner. Performed at [facility], [place]  [address], [place], [place] [address]   Culture, Urine     Status: Abnormal   Collection Time: 06/15/19  3:26 AM   Specimen: Urine, Random  Result Value Ref Range Status   Specimen Description URINE, RANDOM  Final   Special Requests NONE  Final   Culture (A)  Final    <10,000 COLONIES/mL INSIGNIFICANT GROWTH Performed at [facility], [place] [address], [place], [place] [address]    Report Status 06/16/2019 FINAL  Final         Radiology Studies: CT ABDOMEN PELVIS WO CONTRAST  Result Date: 06/15/2019 CLINICAL DATA:  Postoperative lower abdominal pain and fever. EXAM: CT ABDOMEN AND PELVIS WITHOUT CONTRAST TECHNIQUE: Multidetector CT imaging of the abdomen and pelvis was performed following the standard protocol without IV contrast. COMPARISON:  May 24, 2018 FINDINGS: Lower chest: Mild, stable linear scarring is seen within the left lung base. Hepatobiliary: No focal liver abnormality is seen. No gallstones, gallbladder wall thickening, or biliary dilatation. Pancreas: Unremarkable. No pancreatic ductal dilatation or surrounding inflammatory changes. Spleen: Normal in size without focal abnormality. Adrenals/Urinary Tract: Adrenal glands are unremarkable. Mild bilateral renal atrophy is seen, without renal calculi or hydronephrosis. A stable 1.2 cm cyst is seen within the posterior aspect of the mid left kidney. Bladder is unremarkable. Stomach/Bowel: Stomach is within normal limits. The appendix is poorly visualized. No evidence of bowel wall thickening, distention, or inflammatory changes. Noninflamed diverticula are seen within the sigmoid colon. Vascular/Lymphatic: Marked severity aortic calcification. No enlarged abdominal or pelvic lymph nodes. Reproductive: Status post hysterectomy. No adnexal masses. Other: No abdominal wall hernia or abnormality. No abdominopelvic ascites. Musculoskeletal: Multilevel degenerative changes are seen within the lumbar spine. IMPRESSION: 1. Sigmoid  diverticulosis without evidence of acute diverticulitis. 2. Marked severity aortic calcification. Aortic Atherosclerosis (ICD10-I70.0). Electronically Signed   By: Virgina Norfolk M.D.   On: 06/15/2019 04:04        Scheduled Meds:  atorvastatin  40 mg Oral QHS   carvedilol  6.25 mg Oral BID WC   cholecalciferol  2,000 Units Oral Daily   digoxin  0.125 mg Oral QODAY   diltiazem  120 mg Oral Daily   escitalopram  10 mg Oral Daily   gabapentin  100 mg Oral TID   melatonin  3 mg Oral QHS   polyvinyl alcohol  1 drop Both  Eyes QID   senna-docusate  1 tablet Oral BID   sodium chloride  2 spray Each Nare QID   traMADol  50 mg Oral 3 times per day   traZODone  150 mg Oral QHS   Continuous Infusions:  ceFEPime (MAXIPIME) IV 2 g (06/16/19 1705)   vancomycin 1,250 mg (06/16/19 1057)     LOS: 2 days        Hosie Poisson, MD [facility]   To contact the attending provider between 7A-7P or the covering provider during after hours 7P-7A, please log into the web site [email or web address] and access using universal [place] password for that web site. If you do not have the password, please call the hospital operator.  06/16/2019, 5:34 PM

## 2019-06-16 NOTE — Plan of Care (Signed)

## 2019-06-17 DIAGNOSIS — S92355A Nondisplaced fracture of fifth metatarsal bone, left foot, initial encounter for closed fracture: Secondary | ICD-10-CM

## 2019-06-17 LAB — CBC WITH DIFFERENTIAL/PLATELET
Abs Immature Granulocytes: 0.06 10*3/uL (ref 0.00–0.07)
Basophils Absolute: 0 10*3/uL (ref 0.0–0.1)
Basophils Relative: 0 %
Eosinophils Absolute: 0.2 10*3/uL (ref 0.0–0.5)
Eosinophils Relative: 2 %
HCT: 37.6 % (ref 36.0–46.0)
Hemoglobin: 12.1 g/dL (ref 12.0–15.0)
Immature Granulocytes: 1 %
Lymphocytes Relative: 10 %
Lymphs Abs: 1.2 10*3/uL (ref 0.7–4.0)
MCH: 30.9 pg (ref 26.0–34.0)
MCHC: 32.2 g/dL (ref 30.0–36.0)
MCV: 96.2 fL (ref 80.0–100.0)
Monocytes Absolute: 1.1 10*3/uL — ABNORMAL HIGH (ref 0.1–1.0)
Monocytes Relative: 10 %
Neutro Abs: 9.4 10*3/uL — ABNORMAL HIGH (ref 1.7–7.7)
Neutrophils Relative %: 77 %
Platelets: 204 10*3/uL (ref 150–400)
RBC: 3.91 MIL/uL (ref 3.87–5.11)
RDW: 13.2 % (ref 11.5–15.5)
WBC: 12 10*3/uL — ABNORMAL HIGH (ref 4.0–10.5)
nRBC: 0 % (ref 0.0–0.2)

## 2019-06-17 LAB — BASIC METABOLIC PANEL
Anion gap: 10 (ref 5–15)
BUN: 27 mg/dL — ABNORMAL HIGH (ref 8–23)
CO2: 23 mmol/L (ref 22–32)
Calcium: 8.8 mg/dL — ABNORMAL LOW (ref 8.9–10.3)
Chloride: 106 mmol/L (ref 98–111)
Creatinine, Ser: 1.31 mg/dL — ABNORMAL HIGH (ref 0.44–1.00)
GFR calc Af Amer: 43 mL/min — ABNORMAL LOW (ref >60)
GFR calc non Af Amer: 37 mL/min — ABNORMAL LOW (ref >60)
Glucose, Bld: 100 mg/dL — ABNORMAL HIGH (ref 70–99)
Potassium: 3.7 mmol/L (ref 3.5–5.1)
Sodium: 139 mmol/L (ref 135–145)

## 2019-06-17 LAB — PROCALCITONIN: Procalcitonin: 0.13 ng/mL

## 2019-06-17 MED ORDER — SALINE SPRAY 0.65 % NA SOLN
1.0000 | NASAL | Status: DC | PRN
  Administered 2019-06-17: 1 via NASAL
  Filled 2019-06-17: qty 44

## 2019-06-17 MED ORDER — CEPHALEXIN 500 MG PO CAPS
500.0000 mg | ORAL_CAPSULE | Freq: Two times a day (BID) | ORAL | Status: DC
  Administered 2019-06-17 – 2019-06-18 (×3): 500 mg via ORAL
  Filled 2019-06-17 (×4): qty 1

## 2019-06-17 NOTE — Plan of Care (Signed)

## 2019-06-17 NOTE — Progress Notes (Signed)
Patient ID: Tracy Rodriguez, female   DOB: 03-06-1932, 86 y.o.   MRN: [id number] Patient seen in the office 1 week ago by Dr. Redmond Baseman.  Since then she returned to the emergency department and had packing placed in the right nasal cavity.  No significant bleeding since then.  I have been reconsulted to remove the nasal packing so that she can transfer to a nursing facility.  She is here with her family member.  She is awake and alert.  Breathing is unlabored.  There is no active bleeding and there is no dried blood around the nose face or oral cavity.  Inflatable packing deflated and removed without difficulty.  No signs of bleeding.  Start saline spray and follow-up as needed.

## 2019-06-17 NOTE — NC FL2 (Signed)
[employer] MEDICAID FL2 LEVEL OF CARE SCREENING TOOL     IDENTIFICATION  Patient Name: Tracy Rodriguez Birthdate: 01-Jan-1932 Sex: female Admission Date (Current Location): 06/14/2019  [facility] and [place] Number:  Herbalist and Address:  [facility] [place], [place] [address] [place], [place] [address]      Provider Number: [id number]  Attending Physician Name and Address:  Hosie Poisson, MD  Relative Name and Phone Number:  Eustaquio Maize (daughter) [phone]    Current Level of Care: Hospital Recommended Level of Care: [place] Prior Approval Number:    Date Approved/Denied:   PASRR Number: (Pending)  Discharge Plan: SNF    Current Diagnoses: Patient Active Problem List   Diagnosis Date Noted  . Epistaxis 06/15/2019  . Sepsis ([place]) 06/14/2019  . Functional dyspnea (2/2 posture/positioning w/ abnormal sprirometry) 10/03/2016  . Bipolar 1 disorder ([place]) 10/03/2016  . PAF (paroxysmal atrial fibrillation) ([place]) 10/03/2016  . Hypertension 10/03/2016  . Orthostatic syncope 10/03/2016  . Diarrhea 10/03/2016  . Chronic diastolic heart failure ([place]) 10/03/2016  . Supratherapeutic INR 10/03/2016  . Dementia ([place]) 10/03/2016  . Hyperlipemia 10/03/2016  . CKD (chronic kidney disease) stage 3, GFR 30-59 ml/min 10/03/2016  . Symptomatic bradycardia   . Dyspnea 09/16/2016  . CHF (congestive heart failure) ([place]) 03/04/2016  . Acute renal failure superimposed on stage 3 chronic kidney disease ([place]) 02/08/2016  . Fall   . Syncope 12/05/2015  . Physical deconditioning 12/05/2015  . Encounter for therapeutic drug monitoring 12/22/2014  . Atrial fibrillation with RVR ([place]) 08/04/2014  . Paroxysmal atrial fibrillation ([place]) 08/04/2014  . Hypertensive urgency   . Obesity 09/27/2013  . Chronic back pain 09/14/2012  . Bipolar disorder ([place]) 09/14/2012    Orientation RESPIRATION BLADDER Height & Weight     Self, Situation  O2(4L nasal cannula)  Continent Weight: 166 lb 14.2 oz (75.7 kg) Height:  5' 6.5" (168.9 cm)  BEHAVIORAL SYMPTOMS/MOOD NEUROLOGICAL BOWEL NUTRITION STATUS      Continent Diet(see discharge summary)  AMBULATORY STATUS COMMUNICATION OF NEEDS Skin   Limited Assist Verbally Other (Comment)(ecchymosis leg)                       Personal Care Assistance Level of Assistance  Bathing, Dressing, Total care, Feeding Bathing Assistance: Limited assistance Feeding assistance: Independent Dressing Assistance: Limited assistance Total Care Assistance: Limited assistance   Functional Limitations Info  Sight, Hearing, Speech Sight Info: Adequate Hearing Info: Adequate Speech Info: Adequate    SPECIAL CARE FACTORS FREQUENCY  PT (By licensed PT), OT (By licensed OT)     PT Frequency: min 5x weekly OT Frequency: min 5x weekly            Contractures Contractures Info: Not present    Additional Factors Info  Code Status, Allergies Code Status Info: DNR Allergies Info: Codeine, Morphine and related, valium (diazepam), sulfa antibiotics, darvon (propoxyphene), floxin (oflaxacin), levsin (hyoscyamine sulfate), pencillins, zantac (ranitidine hcl)           Current Medications (06/17/2019):  This is the current hospital active medication list Current Facility-Administered Medications  Medication Dose Route Frequency Provider Last Rate Last Admin  . acetaminophen (TYLENOL) tablet 650 mg  650 mg Oral Q6H PRN Shela Leff, MD   650 mg at 06/16/19 1652   Or  . acetaminophen (TYLENOL) suppository 650 mg  650 mg Rectal Q6H PRN Shela Leff, MD      . atorvastatin (LIPITOR) tablet 40 mg  40 mg Oral QHS Shela Leff, MD   40 mg at 06/16/19 2217  . carvedilol (COREG) tablet 6.25 mg  6.25 mg Oral BID WC Domenic Polite, MD   6.25 mg at 06/17/19 0900  . ceFEPIme (MAXIPIME) 2 g in sodium chloride 0.9 % 100 mL IVPB  2 g Intravenous Q12H Franky Macho, RPH   Stopped at 06/17/19 0450  . cholecalciferol  (VITAMIN D3) tablet 2,000 Units  2,000 Units Oral Daily Shela Leff, MD   2,000 Units at 06/17/19 0859  . digoxin (LANOXIN) tablet 0.125 mg  0.125 mg Oral Derrek Gu, MD   0.125 mg at 06/17/19 0900  . diltiazem (CARDIZEM CD) 24 hr capsule 120 mg  120 mg Oral Daily Shela Leff, MD   120 mg at 06/17/19 0859  . escitalopram (LEXAPRO) tablet 10 mg  10 mg Oral Daily Shela Leff, MD   10 mg at 06/17/19 0900  . gabapentin (NEURONTIN) capsule 100 mg  100 mg Oral TID Shela Leff, MD   100 mg at 06/17/19 0900  . hydrALAZINE (APRESOLINE) injection 10 mg  10 mg Intravenous Q4H PRN Shela Leff, MD      . melatonin tablet 3 mg  3 mg Oral QHS Shela Leff, MD   3 mg at 06/16/19 2217  . methocarbamol (ROBAXIN) tablet 500 mg  500 mg Oral Q6H PRN Domenic Polite, MD   500 mg at 06/15/19 1019  . oxymetazoline (AFRIN) 0.05 % nasal spray 1-2 spray  1-2 spray Each Nare TID PRN Shela Leff, MD      . polyvinyl alcohol (LIQUIFILM TEARS) 1.4 % ophthalmic solution 1 drop  1 drop Both Eyes QID Domenic Polite, MD   1 drop at 06/17/19 0900  . senna-docusate (Senokot-S) tablet 1 tablet  1 tablet Oral BID Shela Leff, MD   1 tablet at 06/16/19 2217  . sodium chloride (OCEAN) 0.65 % nasal spray 2 spray  2 spray Each Nare QID Shela Leff, MD   2 spray at 06/17/19 0900  . traMADol (ULTRAM) tablet 50 mg  50 mg Oral 3 times per day Shela Leff, MD   50 mg at 06/17/19 0859  . traZODone (DESYREL) tablet 150 mg  150 mg Oral QHS Shela Leff, MD   150 mg at 06/16/19 2217  . vancomycin (VANCOREADY) IVPB 1250 mg/250 mL  1,250 mg Intravenous Q36H Franky Macho, [facility]   Stopped at 06/16/19 1240     Discharge Medications: Please see discharge summary for a list of discharge medications.  Relevant Imaging Results:  Relevant Lab Results:   Additional Information SSN: [id number]  Alberteen Sam, LCSW

## 2019-06-17 NOTE — TOC Progression Note (Signed)
Transition of Care [facility]) - Progression Note    Patient Details  Name: Tracy Rodriguez MRN: [id number] Date of Birth: 30-Jun-1931  Transition of Care [facility]) CM/SW [place], [place] Phone Number: 06/17/2019, 9:41 AM  Clinical Narrative:     Patient's daughter Eustaquio Maize called CSW back and reports preferences for [facility], states she has been at [facility] in the past and Beth lives close to [employer]. Agreeable for referrals to be faxed out to those facilities.   CSW has faxed out referrals pending response at this time.   Expected Discharge Plan: [place] Barriers to Discharge: Continued Medical Work up  Expected Discharge Plan and Services Expected Discharge Plan: [place] Choice: [place] arrangements for the past 2 months: Assisted Living Facility([name])                                       Social Determinants of Health (SDOH) Interventions    Readmission Risk Interventions No flowsheet data found.

## 2019-06-17 NOTE — Progress Notes (Addendum)
PROGRESS NOTE    Tracy Rodriguez  GYF:[id number] DOB: 18-Oct-1931 DOA: 06/14/2019 PCP: System, Pcp Not In   Brief Narrative: 86 year old lady with prior history of dementia paroxysmal atrial fibrillation, bipolar disorder, chronic diastolic heart failure, epistaxis has been to ED in the past 1 week twice for recurrent epistaxis presents this admission for a fall and acute fracture involving head of the fifth proximal PHALANX.  Assessment & Plan:   Principal Problem:   Sepsis [facility]) Active Problems:   Paroxysmal atrial fibrillation ([place])   Hypertensive urgency   Fall   Epistaxis   Sepsis ruled out.  Patient on admission was tachycardic, had  leukocytosis with lactic acidosis.  Chest x-ray is negative for pneumonia and urine analysis negative for infection.  Patient is alert and answering questions appropriately.  Nasal pack in place for epistaxis, with no signs of toxic shock syndrome.  Gentle hydration and initiate started on broad-spectrum IV antibiotics.  So far blood cultures have been negative.  Transition to oral keflex today.    Paroxysmal atrial fibrillation Anticoagulation is on hold for epistaxis Rate control with Cardizem. Last echocardiogram showed There was moderate  concentric hypertrophy. Systolic function was vigorous. The  estimated ejection fraction was in the range of 65% to 70% Doppler parameters are consistent with abnormal  left ventricular relaxation (grade 1 diastolic dysfunction).  [place] 2 vas2 score is 5, discussed with the patient's daughter .     Hypertensive urgency Resolved BP parameters are well controlled.    Mild troponin elevation probably secondary to tachycardia and demand ischemia from hypertensive urgency and atrial fibrillation. No chest pain today   Generalized weakness leading to a fall PT OT evaluation recommending SNF.   Acute fracture of the proximal phalanx of the fifth digit CAM Walker in place, recommend outpatient follow-up  with podiatry.  Recurrent epistaxis Was treated with silver nitrate and nasal packing on the right nostril ENT consulted to see if the nasal packing can be removed today prior to discharge.     DVT prophylaxis: SCDs Code Status: DNR Family Communication: Family at bedside Disposition Plan:  . Patient came from: SNF           . Anticipated d/c place: SNF Barriers to d/c OR conditions which need to be met to effect a safe d/c: IV antibiotics, evaluation for sepsis  Consultants:   None  Procedures: None Antimicrobials: Vancomycin and cefepime since admission  Subjective: Reports some back pain from laying in bed.  No chest pain or sob.   Objective: Vitals:   06/17/19 0345 06/17/19 0746 06/17/19 0814 06/17/19 1147  BP: 140/66 140/74  129/80  Pulse: 65 85 76 73  Resp: '18 14 17 19  '$ Temp: 97.6 F (36.4 C) 98.5 F (36.9 C)  (!) 97.4 F (36.3 C)  TempSrc: Oral Oral  Axillary  SpO2: 94% 100% 97% 100%  Weight:      Height:        Intake/Output Summary (Last 24 hours) at 06/17/2019 1318 Last data filed at 06/17/2019 0700 Gross per 24 hour  Intake 371.62 ml  Output 1100 ml  Net -728.38 ml   Filed Weights   06/15/19 1621  Weight: 75.7 kg    Examination:  General exam: Ill-appearing elderly lady denies any new complaints, not in any kind of distress Respiratory system: Clear to auscultation bilaterally, no wheezing or rhonchi Cardiovascular system: S1-S2 heard, regular rate rhythm, no JVD, no pedal edema Gastrointestinal system: Abdomen is soft, nontender, nondistended, bowel sounds normal  Central nervous system: Alert and oriented to person only extremities: No pedal edema Skin: No rashes seen Psychiatry: Mood is appropriate    Data Reviewed: I have personally reviewed following labs and imaging studies  CBC: Recent Labs  Lab 06/12/19 2142 06/14/19 1300 06/15/19 0332 06/16/19 0245 06/17/19 0315  WBC 10.4 20.3* 19.7* 14.5* 12.0*  NEUTROABS 7.3 18.1*  --    --  9.4*  HGB 12.4 15.8* 13.8 11.6* 12.1  HCT 39.3 49.1* 42.2 35.4* 37.6  MCV 98.5 95.0 94.4 95.4 96.2  PLT 236 254 247 203 810   Basic Metabolic Panel: Recent Labs  Lab 06/12/19 2142 06/14/19 1300 06/16/19 0245 06/16/19 1459 06/17/19 0315  NA 139 138 138  --  139  K 4.4 3.9 3.1*  --  3.7  CL 102 98 102  --  106  CO2 '26 25 24  '$ --  23  GLUCOSE 90 137* 105*  --  100*  BUN 20 20 29*  --  27*  CREATININE 1.17* 1.20* 1.37*  --  1.31*  CALCIUM 9.0 9.4 8.6*  --  8.8*  MG  --   --   --  2.1  --    GFR: Estimated Creatinine Clearance: 32.4 mL/min (A) (by C-G formula based on SCr of 1.31 mg/dL (H)). Liver Function Tests: Recent Labs  Lab 06/14/19 1300  AST 23  ALT 14  ALKPHOS 58  BILITOT 1.2  PROT 7.7  ALBUMIN 3.7   No results for input(s): LIPASE, AMYLASE in the last 168 hours. No results for input(s): AMMONIA in the last 168 hours. Coagulation Profile: Recent Labs  Lab 06/14/19 1551  INR 2.3*   Cardiac Enzymes: No results for input(s): CKTOTAL, CKMB, CKMBINDEX, TROPONINI in the last 168 hours. BNP (last 3 results) No results for input(s): PROBNP in the last 8760 hours. HbA1C: No results for input(s): HGBA1C in the last 72 hours. CBG: No results for input(s): GLUCAP in the last 168 hours. Lipid Profile: No results for input(s): CHOL, HDL, LDLCALC, TRIG, CHOLHDL, LDLDIRECT in the last 72 hours. Thyroid Function Tests: No results for input(s): TSH, T4TOTAL, FREET4, T3FREE, THYROIDAB in the last 72 hours. Anemia Panel: No results for input(s): VITAMINB12, FOLATE, FERRITIN, TIBC, IRON, RETICCTPCT in the last 72 hours. Sepsis Labs: Recent Labs  Lab 06/14/19 1401 06/14/19 1613 06/15/19 0332 06/16/19 0245 06/16/19 1459 06/17/19 0315  PROCALCITON  --   --  <0.10 0.13  --  0.13  LATICACIDVEN 2.1* 2.1* 2.0*  --  1.9  --     Recent Results (from the past 240 hour(s))  Blood culture (routine x 2)     Status: None (Preliminary result)   Collection Time: 06/14/19   3:42 PM   Specimen: BLOOD  Result Value Ref Range Status   Specimen Description BLOOD RIGHT ANTECUBITAL  Final   Special Requests   Final    BOTTLES DRAWN AEROBIC AND ANAEROBIC Blood Culture results may not be optimal due to an inadequate volume of blood received in culture bottles   Culture   Final    NO GROWTH 3 DAYS Performed at [facility], [place] [address], [place], [place] [address]    Report Status PENDING  Incomplete  Blood culture (routine x 2)     Status: None (Preliminary result)   Collection Time: 06/14/19  3:47 PM   Specimen: BLOOD LEFT HAND  Result Value Ref Range Status   Specimen Description BLOOD LEFT HAND  Final   Special Requests  Final    BOTTLES DRAWN AEROBIC ONLY Blood Culture results may not be optimal due to an inadequate volume of blood received in culture bottles   Culture   Final    NO GROWTH 3 DAYS Performed at [facility], [place] [address], [place], [place] [address]    Report Status PENDING  Incomplete  SARS CORONAVIRUS 2 (TAT 6-24 HRS) Nasopharyngeal Nasopharyngeal Swab     Status: None   Collection Time: 06/14/19  4:02 PM   Specimen: Nasopharyngeal Swab  Result Value Ref Range Status   SARS Coronavirus 2 NEGATIVE NEGATIVE Final    Comment: (NOTE) SARS-CoV-2 target nucleic acids are NOT DETECTED. The SARS-CoV-2 RNA is generally detectable in upper and lower respiratory specimens during the acute phase of infection. Negative results do not preclude SARS-CoV-2 infection, do not rule out co-infections with other pathogens, and should not be used as the sole basis for treatment or other patient management decisions. Negative results must be combined with clinical observations, patient history, and epidemiological information. The expected result is Negative. Fact Sheet for Patients: SugarRoll.be Fact Sheet for Healthcare Providers: https://www.woods-mathews.com/ This test is not yet approved or  cleared by the [place] FDA and  has been authorized for detection and/or diagnosis of SARS-CoV-2 by FDA under an Emergency Use Authorization (EUA). This EUA will remain  in effect (meaning this test can be used) for the duration of the COVID-19 declaration under Section 56 4(b)(1) of the Act, 21 U.S.C. section 360bbb-3(b)(1), unless the authorization is terminated or revoked sooner. Performed at [facility], [place] [address] [place], [place] [address]   Culture, Urine     Status: Abnormal   Collection Time: 06/15/19  3:26 AM   Specimen: Urine, Random  Result Value Ref Range Status   Specimen Description URINE, RANDOM  Final   Special Requests NONE  Final   Culture (A)  Final    <10,000 COLONIES/mL INSIGNIFICANT GROWTH Performed at [facility], [place] [address] [place], [place] [address]    Report Status 06/16/2019 FINAL  Final         Radiology Studies: No results found.      Scheduled Meds: . atorvastatin  40 mg Oral QHS  . carvedilol  6.25 mg Oral BID WC  . cephALEXin  500 mg Oral Q12H  . cholecalciferol  2,000 Units Oral Daily  . digoxin  0.125 mg Oral QODAY  . diltiazem  120 mg Oral Daily  . escitalopram  10 mg Oral Daily  . gabapentin  100 mg Oral TID  . melatonin  3 mg Oral QHS  . polyvinyl alcohol  1 drop Both Eyes QID  . senna-docusate  1 tablet Oral BID  . sodium chloride  2 spray Each Nare QID  . traMADol  50 mg Oral 3 times per day  . traZODone  150 mg Oral QHS   Continuous Infusions:    LOS: 3 days        Hosie Poisson, MD [facility]   To contact the attending provider between 7A-7P or the covering provider during after hours 7P-7A, please log into the web site [email or web address] and access using universal  password for that web site. If you do not have the password, please call the hospital operator.  06/17/2019, 1:18 PM

## 2019-06-18 MED ORDER — RIVAROXABAN 15 MG PO TABS: 15.0000 mg | ORAL_TABLET | Freq: Every day | ORAL | Status: AC

## 2019-06-18 MED ORDER — CEPHALEXIN 500 MG PO CAPS: 500.0000 mg | ORAL_CAPSULE | Freq: Two times a day (BID) | ORAL | 0 refills | Status: DC

## 2019-06-18 NOTE — TOC Progression Note (Addendum)
Transition of Care [facility]) - Progression Note    Patient Details  Name: Tracy Rodriguez MRN: [id number] Date of Birth: 1932/03/11  Transition of Care [facility]) CM/SW [place], [place] Phone Number: 06/18/2019, 11:06 AM  Clinical Narrative:     Michiel Cowboy with [facility] Must completed assessment via phone with patient for PASRR number to be issued for SNF placement. She reports PASRR number to be issued today, assessment successfully completed. Once PASRR number received patient can dc to SNF.   CSW spoke with patient's daughter Tracy Rodriguez, agreeable to [facility] SNF, will be at [facility] at 2pm today to complete all admission paperwork.     Expected Discharge Plan: [place] Barriers to Discharge: Continued Medical Work up  Expected Discharge Plan and Services Expected Discharge Plan: [place] Choice: [place] arrangements for the past 2 months: Assisted Living Facility([name])                                       Social Determinants of Health (SDOH) Interventions    Readmission Risk Interventions No flowsheet data found.

## 2019-06-18 NOTE — Discharge Instructions (Signed)
Hold xarelto for one week, if no nose bleeds see primary care physician to possibly resume.

## 2019-06-18 NOTE — TOC Transition Note (Signed)
Transition of Care [facility]) - CM/SW Discharge Note   Patient Details  Name: Tracy Rodriguez MRN: [id number] Date of Birth: 05/19/1931  Transition of Care [facility]) CM/SW Contact:  Alberteen Sam, LCSW Phone Number: 06/18/2019, 3:05 PM   Clinical Narrative:     Patient will DC to: [facility] Anticipated DC date: 06/18/19 Family notified: Eustaquio Maize (daughter) Transport by: Corey Harold  Per MD patient ready for DC to [facility] . RN, patient, patient's family, and facility notified of DC. Discharge Summary sent to facility. RN given number for report  [phone] . DC packet on chart. Ambulance transport requested for patient.  CSW signing off.  [place], [place]   Final next level of care: Skilled Nursing Facility Barriers to Discharge: No Barriers Identified   Patient Goals and CMS Choice Patient states their goals for this hospitalization and ongoing recovery are:: to go to SNF CMS Medicare.gov Compare Post Acute Care list provided to:: Patient Represenative (must comment)(daughter Beth) Choice offered to / list presented to : Adult Children  Discharge Placement PASRR number recieved: (assesment done today, B Julien Girt said she will issue but may not be by end of day. [facility] okay with this.)            Patient [facility] bed at: [facility] Patient to be transferred to facility by: [place] Name of family member notified: daughter Eustaquio Maize Patient and family notified of of transfer: 06/18/19  Discharge Plan and Services     Post Acute Care Choice: [place]                               Social Determinants of Health (SDOH) Interventions     Readmission Risk Interventions No flowsheet data found.

## 2019-06-18 NOTE — Discharge Summary (Signed)
Physician Discharge Summary  Tracy Rodriguez [id number] DOB: [birth year] DOA: 06/14/2019  PCP: System, Pcp Not In  Admit date: 06/14/2019 Discharge date: 06/18/2019  Admitted From: SNF  Disposition:SNF   Recommendations for Outpatient Follow-up:  1. Follow up with PCP in 1-2 weeks 2. Please obtain BMP/CBC in one week 3. We have held your xarelto, please follow up with PCp in one week and resume the xarelto if no bleeding.  4. Recommend outpatient follow up with podiatry.    Discharge Condition: guarded CODE STATUS: DNR.  Diet recommendation: Heart Healthy  Brief/Interim Summary: 86 year old lady with prior history of dementia paroxysmal atrial fibrillation, bipolar disorder, chronic diastolic heart failure, epistaxis has been to ED in the past 1 week twice for recurrent epistaxis presents this admission for a fall and acute fracture involving head of the fifth proximal phalanx.  Discharge Diagnoses:  Principal Problem:   Sepsis [facility]) Active Problems:   Paroxysmal atrial fibrillation ([place])   Hypertensive urgency   Fall   Epistaxis   Sepsis ruled out.  Patient on admission was tachycardic, had  leukocytosis with lactic acidosis.  Chest x-ray is negative for pneumonia and urine analysis negative for infection.  Patient is alert and answering questions appropriately.  Nasal pack in place for epistaxis, with no signs of toxic shock syndrome.  Gentle hydration and initiate started on broad-spectrum IV antibiotics.  So far blood cultures have been negative.  Transition to oral keflex for discharge.    Paroxysmal atrial fibrillation Anticoagulation is on hold for epistaxis Rate control with Cardizem. Last echocardiogram showed There was moderate  concentric hypertrophy. Systolic function was vigorous. The  estimated ejection fraction was in the range of 65% to 70% Doppler parameters are consistent with abnormal  left ventricular relaxation (grade 1 diastolic dysfunction).  [place] 2  vas2 score is 5, discussed with the patient's daughter .  Plan to restart the anticoagulation in one week.     Hypertensive urgency Resolved.     Mild troponin elevation probably secondary to tachycardia and demand ischemia from hypertensive urgency and atrial fibrillation. No chest pain today   Generalized weakness leading to a fall PT OT evaluation recommending SNF.   Acute fracture of the proximal phalanx of the fifth digit CAM Walker in place, recommend outpatient follow-up with podiatry.  Recurrent epistaxis Was treated with silver nitrate and nasal packing on the right nostril ENT consulted and the nasal packing removed.  No epistaxis today.    Discharge Instructions  Discharge Instructions    Diet - low sodium heart healthy   Complete by: As directed    Discharge instructions   Complete by: As directed    Please hold Xarelto for one week, and if there is not epistaxis, please follow up with PCP to see if  You can resume the Xarelto.     Allergies as of 06/18/2019      Reactions   Codeine Anaphylaxis, Nausea And Vomiting   Note: pt has tramadol for years with no reaction   Morphine And Related Nausea And Vomiting   Severe nausea per daughter   Valium [diazepam] Shortness Of Breath   Sulfa Antibiotics Itching, Rash   Darvon [propoxyphene] Nausea And Vomiting   Floxin [ofloxacin] Itching   Levsin [hyoscyamine Sulfate] Other (See Comments)   Unknown reaction   Penicillins Itching   Itching reported by [facility] Has patient had a PCN reaction causing immediate rash, facial/tongue/throat swelling, SOB or lightheadedness with hypotension: Yes Has patient had a  PCN reaction causing severe rash involving mucus membranes or skin necrosis: No Has patient had a PCN reaction that required hospitalization No Has patient had a PCN reaction occurring within the last 10 years: No If all of the above answers are "NO", then may proceed with  Cephalosporin use.   Zantac [ranitidine Hcl] Other (See Comments)   Unknown reaction      Medication List    STOP taking these medications   HYDROcodone-acetaminophen 5-325 MG tablet Commonly known as: NORCO/VICODIN     TAKE these medications   acetaminophen 500 MG tablet Commonly known as: TYLENOL Take 500 mg by mouth See admin instructions. Take one tablet (500 mg) by mouth every 6 hours - scheduled; may also take 1 tablet (500 mg) every 4 hours as needed for headache, minor discomfort and fever 99.5-101 F - do not exceed 2000 mg/24 hours   alum & mag hydroxide-simeth 200-200-20 MG/5ML suspension Commonly known as: MAALOX/MYLANTA Take 30 mLs by mouth daily as needed for indigestion or heartburn.   atorvastatin 40 MG tablet Commonly known as: LIPITOR Take 40 mg by mouth at bedtime.   AZO Cranberry 250-30 MG Tabs Take 1 tablet by mouth daily.   carboxymethylcellulose 0.5 % Soln Commonly known as: REFRESH PLUS Place 1 drop into both eyes 4 (four) times daily.   carvedilol 3.125 MG tablet Commonly known as: COREG Take 1 tablet (3.125 mg total) by mouth 2 (two) times daily with a meal. PLEASE START THE LOWER DOSE ON Monday, 7/30 DEPENDING ON HER HEART RATE. OK TO START IF HEART RATE IS NOT LESS THAN 65. What changed: additional instructions   cephALEXin 500 MG capsule Commonly known as: KEFLEX Take 1 capsule (500 mg total) by mouth every 12 (twelve) hours.   digoxin 0.125 MG tablet Commonly known as: LANOXIN Take 0.125 mg by mouth every other day. Hold if  pulse is less than 60   diltiazem 120 MG 24 hr capsule Commonly known as: CARDIZEM CD Take 120 mg daily by mouth.   escitalopram 10 MG tablet Commonly known as: LEXAPRO TAKE 1 TABLET(10 MG) BY MOUTH DAILY What changed:   how much to take  how to take this  when to take this  additional instructions   furosemide 20 MG tablet Commonly known as: LASIX Take 20 mg by mouth daily.   gabapentin 100 MG  capsule Commonly known as: NEURONTIN Take 100 mg by mouth 3 (three) times daily.   guaifenesin 100 MG/5ML syrup Commonly known as: ROBITUSSIN Take 200 mg by mouth every 6 (six) hours as needed for cough.   loperamide 2 MG capsule Commonly known as: IMODIUM Take 2 mg by mouth as needed for diarrhea or loose stools (do not exceed 8 doses in 24 hours).   magnesium hydroxide 400 MG/5ML suspension Commonly known as: MILK OF MAGNESIA Take 30 mLs by mouth at bedtime as needed (constipation).   melatonin 3 MG Tabs tablet Take 3 mg by mouth at bedtime.   methocarbamol 500 MG tablet Commonly known as: ROBAXIN Take 500 mg by mouth 2 (two) times daily as needed for muscle spasms.   neomycin-bacitracin-polymyxin Oint Commonly known as: NEOSPORIN Apply 1 application topically as needed (skin tears/abrasions).   ondansetron 4 MG tablet Commonly known as: ZOFRAN Take 4 mg by mouth every 4 (four) hours as needed for nausea or vomiting.   oxymetazoline 0.05 % nasal spray Commonly known as: AFRIN Place 1-2 sprays into both nostrils 3 (three) times daily as needed (nosebleeds).  Preparation H 1-0.25-14.4-15 % Crea Generic drug: Pramox-PE-Glycerin-Petrolatum Place 1 application rectally daily as needed (hemorrhoids).   PreviDent 5000 Booster Plus 1.1 % Pste Generic drug: Sodium Fluoride Place 1 application onto teeth See admin instructions. Brush/floss normally, then use a smear of prevident on your toothbrush twice daily; do not eat/drink/rinse/ or use mouthwash after use   Rivaroxaban 15 MG Tabs tablet Commonly known as: XARELTO Take 1 tablet (15 mg total) by mouth daily. HOLD FOR ONE WEEK AND FOLLOW UP WITH pcp before resuming it. What changed: additional instructions   sennosides-docusate sodium 8.6-50 MG tablet Commonly known as: SENOKOT-S Take 1 tablet by mouth daily. What changed: when to take this   sodium chloride 0.65 % Soln nasal spray Commonly known as: OCEAN Place 2  sprays into both nostrils 4 (four) times daily.   traMADol 50 MG tablet Commonly known as: ULTRAM Take 1 tablet (50 mg total) by mouth See admin instructions. Take 50 mg by mouth three times a day at 8:00 am, 14:00 and 22:00 Also take 50 mg by mouth every four hours as needed for sever pain not to exceed total 4/day What changed:   when to take this  additional instructions   traZODone 150 MG tablet Commonly known as: DESYREL Take 1 tablet (150 mg total) by mouth at bedtime.   Vitamin D 50 MCG (2000 UT) tablet Take 2,000 Units by mouth daily.      Contact information for after-discharge care    Destination    [facility] Preferred SNF .   Service: Skilled Nursing Contact information: [address] [phone]             Allergies  Allergen Reactions  . Codeine Anaphylaxis and Nausea And Vomiting    Note: pt has tramadol for years with no reaction  . Morphine And Related Nausea And Vomiting    Severe nausea per daughter  . Valium [Diazepam] Shortness Of Breath  . Sulfa Antibiotics Itching and Rash  . Darvon [Propoxyphene] Nausea And Vomiting  . Floxin [Ofloxacin] Itching  . Levsin [Hyoscyamine Sulfate] Other (See Comments)    Unknown reaction  . Penicillins Itching    Itching reported by [facility] Has patient had a PCN reaction causing immediate rash, facial/tongue/throat swelling, SOB or lightheadedness with hypotension: Yes Has patient had a PCN reaction causing severe rash involving mucus membranes or skin necrosis: No Has patient had a PCN reaction that required hospitalization No Has patient had a PCN reaction occurring within the last 10 years: No If all of the above answers are "NO", then may proceed with Cephalosporin use.   . Zantac [Ranitidine Hcl] Other (See Comments)    Unknown reaction    Consultations:  ENT   Procedures/Studies: CT ABDOMEN PELVIS WO  CONTRAST  Result Date: 06/15/2019 CLINICAL DATA:  Postoperative lower abdominal pain and fever. EXAM: CT ABDOMEN AND PELVIS WITHOUT CONTRAST TECHNIQUE: Multidetector CT imaging of the abdomen and pelvis was performed following the standard protocol without IV contrast. COMPARISON:  May 24, 2018 FINDINGS: Lower chest: Mild, stable linear scarring is seen within the left lung base. Hepatobiliary: No focal liver abnormality is seen. No gallstones, gallbladder wall thickening, or biliary dilatation. Pancreas: Unremarkable. No pancreatic ductal dilatation or surrounding inflammatory changes. Spleen: Normal in size without focal abnormality. Adrenals/Urinary Tract: Adrenal glands are unremarkable. Mild bilateral renal atrophy is seen, without renal calculi or hydronephrosis. A stable 1.2 cm cyst is seen within the posterior  aspect of the mid left kidney. Bladder is unremarkable. Stomach/Bowel: Stomach is within normal limits. The appendix is poorly visualized. No evidence of bowel wall thickening, distention, or inflammatory changes. Noninflamed diverticula are seen within the sigmoid colon. Vascular/Lymphatic: Marked severity aortic calcification. No enlarged abdominal or pelvic lymph nodes. Reproductive: Status post hysterectomy. No adnexal masses. Other: No abdominal wall hernia or abnormality. No abdominopelvic ascites. Musculoskeletal: Multilevel degenerative changes are seen within the lumbar spine. IMPRESSION: 1. Sigmoid diverticulosis without evidence of acute diverticulitis. 2. Marked severity aortic calcification. Aortic Atherosclerosis (ICD10-I70.0). Electronically Signed   By: Virgina Norfolk M.D.   On: 06/15/2019 04:04   DG Tibia/Fibula Left  Result Date: 06/14/2019 CLINICAL DATA:  LEFT foot pain after fall. EXAM: LEFT TIBIA AND FIBULA - 2 VIEW COMPARISON:  None. FINDINGS: There is no evidence of fracture or other focal bone lesions. Soft tissues are unremarkable. IMPRESSION: No fracture of the tibia  or fibula. Electronically Signed   By: Suzy Bouchard M.D.   On: 06/14/2019 12:46   DG Chest Port 1 View  Result Date: 06/14/2019 CLINICAL DATA:  Leukocytosis EXAM: PORTABLE CHEST 1 VIEW COMPARISON:  05/24/2018 FINDINGS: The heart size and mediastinal contours are within normal limits. Both lungs are clear. The visualized skeletal structures are unremarkable. IMPRESSION: No active disease. Electronically Signed   By: Kathreen Devoid   On: 06/14/2019 14:20   DG Foot Complete Left  Result Date: 06/14/2019 CLINICAL DATA:  Left foot pain. Fall. EXAM: LEFT FOOT - COMPLETE 3+ VIEW COMPARISON:  None FINDINGS: Osteopenia and hallux valgus deformity noted. There is an acute fracture involving the head of the fifth proximal phalanx. Mild lateral angulation of the distal fracture fragment. On the lateral radiograph there is a suspicious lucency which extends through the plantar aspect of the base of the first proximal phalanx. Cannot rule out nondisplaced intra-articular fracture. No dislocations identified. IMPRESSION: 1. Acute fracture involves the head of the fifth proximal phalanx. 2. Cannot rule out nondisplaced intra-articular fracture involving the base of the first proximal phalanx. Correlation for any focal tenderness around the base of the first proximal phalanx. Electronically Signed   By: Kerby Moors M.D.   On: 06/14/2019 12:47       Subjective: No new complaints.   Discharge Exam: Vitals:   06/18/19 0712 06/18/19 1126  BP: (!) 124/55 (!) 141/55  Pulse: 71 66  Resp: 20   Temp: 97.9 F (36.6 C)   SpO2: 95%    Vitals:   06/18/19 0300 06/18/19 0400 06/18/19 0712 06/18/19 1126  BP: 130/62  (!) 124/55 (!) 141/55  Pulse: 69 (!) 59 71 66  Resp: 17 (!) 23 20   Temp: 98.2 F (36.8 C)  97.9 F (36.6 C)   TempSrc: Oral  Oral   SpO2: 91% 90% 95%   Weight:      Height:        General: Pt is alert, awake, not in acute distress Cardiovascular: RRR, S1/S2 +, no rubs, no  gallops Respiratory: CTA bilaterally, no wheezing, no rhonchi Abdominal: Soft, NT, ND, bowel sounds + Extremities: no edema, no cyanosis    The results of significant diagnostics from this hospitalization (including imaging, microbiology, ancillary and laboratory) are listed below for reference.     Microbiology: Recent Results (from the past 240 hour(s))  Blood culture (routine x 2)     Status: None (Preliminary result)   Collection Time: 06/14/19  3:42 PM   Specimen: BLOOD  Result Value Ref Range Status  Specimen Description BLOOD RIGHT ANTECUBITAL  Final   Special Requests   Final    BOTTLES DRAWN AEROBIC AND ANAEROBIC Blood Culture results may not be optimal due to an inadequate volume of blood received in culture bottles   Culture   Final    NO GROWTH 4 DAYS Performed at [facility], [place] [address], [place], [place] [address]    Report Status PENDING  Incomplete  Blood culture (routine x 2)     Status: None (Preliminary result)   Collection Time: 06/14/19  3:47 PM   Specimen: BLOOD LEFT HAND  Result Value Ref Range Status   Specimen Description BLOOD LEFT HAND  Final   Special Requests   Final    BOTTLES DRAWN AEROBIC ONLY Blood Culture results may not be optimal due to an inadequate volume of blood received in culture bottles   Culture   Final    NO GROWTH 4 DAYS Performed at [facility], [place] [address] [place], [place] [address]    Report Status PENDING  Incomplete  SARS CORONAVIRUS 2 (TAT 6-24 HRS) Nasopharyngeal Nasopharyngeal Swab     Status: None   Collection Time: 06/14/19  4:02 PM   Specimen: Nasopharyngeal Swab  Result Value Ref Range Status   SARS Coronavirus 2 NEGATIVE NEGATIVE Final    Comment: (NOTE) SARS-CoV-2 target nucleic acids are NOT DETECTED. The SARS-CoV-2 RNA is generally detectable in upper and lower respiratory specimens during the acute phase of infection. Negative results do not preclude SARS-CoV-2 infection, do not rule  out co-infections with other pathogens, and should not be used as the sole basis for treatment or other patient management decisions. Negative results must be combined with clinical observations, patient history, and epidemiological information. The expected result is Negative. Fact Sheet for Patients: SugarRoll.be Fact Sheet for Healthcare Providers: https://www.woods-mathews.com/ This test is not yet approved or cleared by the [place] FDA and  has been authorized for detection and/or diagnosis of SARS-CoV-2 by FDA under an Emergency Use Authorization (EUA). This EUA will remain  in effect (meaning this test can be used) for the duration of the COVID-19 declaration under Section 56 4(b)(1) of the Act, 21 U.S.C. section 360bbb-3(b)(1), unless the authorization is terminated or revoked sooner. Performed at [facility], [place] [address] [place], [place] [address]   Culture, Urine     Status: Abnormal   Collection Time: 06/15/19  3:26 AM   Specimen: Urine, Random  Result Value Ref Range Status   Specimen Description URINE, RANDOM  Final   Special Requests NONE  Final   Culture (A)  Final    <10,000 COLONIES/mL INSIGNIFICANT GROWTH Performed at [facility], [place] [address], [place], [place] [address]    Report Status 06/16/2019 FINAL  Final     Labs: BNP (last 3 results) No results for input(s): BNP in the last 8760 hours. Basic Metabolic Panel: Recent Labs  Lab 06/12/19 2142 06/14/19 1300 06/16/19 0245 06/16/19 1459 06/17/19 0315  NA 139 138 138  --  139  K 4.4 3.9 3.1*  --  3.7  CL 102 98 102  --  106  CO2 26 25 24   --  23  GLUCOSE 90 137* 105*  --  100*  BUN 20 20 29*  --  27*  CREATININE 1.17* 1.20* 1.37*  --  1.31*  CALCIUM 9.0 9.4 8.6*  --  8.8*  MG  --   --   --  2.1  --  Liver Function Tests: Recent Labs  Lab 06/14/19 1300  AST 23  ALT 14  ALKPHOS 58  BILITOT 1.2  PROT 7.7  ALBUMIN 3.7    No results for input(s): LIPASE, AMYLASE in the last 168 hours. No results for input(s): AMMONIA in the last 168 hours. CBC: Recent Labs  Lab 06/12/19 2142 06/14/19 1300 06/15/19 0332 06/16/19 0245 06/17/19 0315  WBC 10.4 20.3* 19.7* 14.5* 12.0*  NEUTROABS 7.3 18.1*  --   --  9.4*  HGB 12.4 15.8* 13.8 11.6* 12.1  HCT 39.3 49.1* 42.2 35.4* 37.6  MCV 98.5 95.0 94.4 95.4 96.2  PLT 236 254 247 203 204   Cardiac Enzymes: No results for input(s): CKTOTAL, CKMB, CKMBINDEX, TROPONINI in the last 168 hours. BNP: Invalid input(s): POCBNP CBG: No results for input(s): GLUCAP in the last 168 hours. D-Dimer No results for input(s): DDIMER in the last 72 hours. Hgb A1c No results for input(s): HGBA1C in the last 72 hours. Lipid Profile No results for input(s): CHOL, HDL, LDLCALC, TRIG, CHOLHDL, LDLDIRECT in the last 72 hours. Thyroid function studies No results for input(s): TSH, T4TOTAL, T3FREE, THYROIDAB in the last 72 hours.  Invalid input(s): FREET3 Anemia work up No results for input(s): VITAMINB12, FOLATE, FERRITIN, TIBC, IRON, RETICCTPCT in the last 72 hours. Urinalysis    Component Value Date/Time   COLORURINE YELLOW 06/14/2019 1850   APPEARANCEUR CLEAR 06/14/2019 1850   LABSPEC 1.016 06/14/2019 1850   PHURINE 6.0 06/14/2019 1850   GLUCOSEU NEGATIVE 06/14/2019 1850   HGBUR MODERATE (A) 06/14/2019 1850   BILIRUBINUR NEGATIVE 06/14/2019 1850   BILIRUBINUR neg 03/22/2016 1557   KETONESUR 5 (A) 06/14/2019 1850   PROTEINUR >=300 (A) 06/14/2019 1850   UROBILINOGEN 0.2 03/22/2016 1557   UROBILINOGEN 0.2 08/04/2014 0657   NITRITE NEGATIVE 06/14/2019 1850   LEUKOCYTESUR NEGATIVE 06/14/2019 1850   Sepsis Labs Invalid input(s): PROCALCITONIN,  WBC,  LACTICIDVEN Microbiology Recent Results (from the past 240 hour(s))  Blood culture (routine x 2)     Status: None (Preliminary result)   Collection Time: 06/14/19  3:42 PM   Specimen: BLOOD  Result Value Ref Range Status    Specimen Description BLOOD RIGHT ANTECUBITAL  Final   Special Requests   Final    BOTTLES DRAWN AEROBIC AND ANAEROBIC Blood Culture results may not be optimal due to an inadequate volume of blood received in culture bottles   Culture   Final    NO GROWTH 4 DAYS Performed at [facility], [place] [address] [place], [place] [address]    Report Status PENDING  Incomplete  Blood culture (routine x 2)     Status: None (Preliminary result)   Collection Time: 06/14/19  3:47 PM   Specimen: BLOOD LEFT HAND  Result Value Ref Range Status   Specimen Description BLOOD LEFT HAND  Final   Special Requests   Final    BOTTLES DRAWN AEROBIC ONLY Blood Culture results may not be optimal due to an inadequate volume of blood received in culture bottles   Culture   Final    NO GROWTH 4 DAYS Performed at [facility], [place] [address], [place], [place] [address]    Report Status PENDING  Incomplete  SARS CORONAVIRUS 2 (TAT 6-24 HRS) Nasopharyngeal Nasopharyngeal Swab     Status: None   Collection Time: 06/14/19  4:02 PM   Specimen: Nasopharyngeal Swab  Result Value Ref Range Status   SARS Coronavirus 2 NEGATIVE NEGATIVE Final    Comment: (  NOTE) SARS-CoV-2 target nucleic acids are NOT DETECTED. The SARS-CoV-2 RNA is generally detectable in upper and lower respiratory specimens during the acute phase of infection. Negative results do not preclude SARS-CoV-2 infection, do not rule out co-infections with other pathogens, and should not be used as the sole basis for treatment or other patient management decisions. Negative results must be combined with clinical observations, patient history, and epidemiological information. The expected result is Negative. Fact Sheet for Patients: SugarRoll.be Fact Sheet for Healthcare Providers: https://www.woods-mathews.com/ This test is not yet approved or cleared by the [place] FDA and  has been authorized for  detection and/or diagnosis of SARS-CoV-2 by FDA under an Emergency Use Authorization (EUA). This EUA will remain  in effect (meaning this test can be used) for the duration of the COVID-19 declaration under Section 56 4(b)(1) of the Act, 21 U.S.C. section 360bbb-3(b)(1), unless the authorization is terminated or revoked sooner. Performed at [facility], [place] [address] [place], [place] [address]   Culture, Urine     Status: Abnormal   Collection Time: 06/15/19  3:26 AM   Specimen: Urine, Random  Result Value Ref Range Status   Specimen Description URINE, RANDOM  Final   Special Requests NONE  Final   Culture (A)  Final    <10,000 COLONIES/mL INSIGNIFICANT GROWTH Performed at [facility], [place] [address], [place], [place] [address]    Report Status 06/16/2019 FINAL  Final     Time coordinating discharge: 34  minutes  SIGNED:   Hosie Poisson, MD  [facility] 06/18/2019, 11:29 AM

## 2019-06-18 NOTE — Plan of Care (Signed)
  Problem: Education: Goal: Knowledge of General Education information will improve Description: Including pain rating scale, medication(s)/side effects and non-pharmacologic comfort measures Outcome: Adequate for Discharge   Problem: Health Behavior/Discharge Planning: Goal: Ability to manage health-related needs will improve Outcome: Adequate for Discharge   Problem: Clinical Measurements: Goal: Ability to maintain clinical measurements within normal limits will improve Outcome: Adequate for Discharge Goal: Will remain free from infection Outcome: Adequate for Discharge Goal: Diagnostic test results will improve Outcome: Adequate for Discharge Goal: Respiratory complications will improve Outcome: Adequate for Discharge Goal: Cardiovascular complication will be avoided Outcome: Adequate for Discharge   Problem: Activity: Goal: Risk for activity intolerance will decrease Outcome: Adequate for Discharge   Problem: Nutrition: Goal: Adequate nutrition will be maintained Outcome: Adequate for Discharge   Problem: Coping: Goal: Level of anxiety will decrease Outcome: Adequate for Discharge   Problem: Elimination: Goal: Will not experience complications related to bowel motility Outcome: Adequate for Discharge Goal: Will not experience complications related to urinary retention Outcome: Adequate for Discharge   Problem: Pain Managment: Goal: General experience of comfort will improve Outcome: Adequate for Discharge   Problem: Safety: Goal: Ability to remain free from injury will improve Outcome: Adequate for Discharge   Problem: Skin Integrity: Goal: Risk for impaired skin integrity will decrease Outcome: Adequate for Discharge   DC instruction packet given to [facility] for transport to facility ([facility]). Daughter was at bedside until 1700, took patient belongings home, except phone and Games developer. NT sent with patient. VSS. PIV DC, hemostasis achieved. Pt on O2. L ortho  boot properly on. Report called to nurse at [facility].

## 2019-06-18 NOTE — Progress Notes (Signed)
Physical Therapy Treatment Patient Details Name: Tracy Rodriguez MRN: [id number] DOB: [birth year] Today's Date: 06/18/2019    History of Present Illness Pt is an 86 y.o. female admitted from [facility] on 06/14/19 after fall with L foot pain. L foot imaging showed acute L 5th proximal phalanx fx; CAM walker boot placed. Worked up for sepsis versus stress demargination. COVID-19 test negative. Of note, pt has had 2x ED visits this past week with epistaxis and has a nasal pack in her right nare. Other PMH includes dementia, chronic pain, arthritis, PAF on anticoagulation, CHF, bipolar disorder.    PT Comments    Patient progressing with ambulation in the room with chair follow and mod to max cues for sequencing, posture and assist for balance/safety.  She still needs min A for sitting balance, but VSS today even with some activity on RA.  PT to follow acutely.    Follow Up Recommendations  SNF;Supervision/Assistance - 24 hour     Equipment Recommendations  None recommended by PT    Recommendations for Other Services       Precautions / Restrictions Precautions Precautions: Fall Required Braces or Orthoses: Other Brace Other Brace: L CAM walker boot Restrictions LLE Weight Bearing: Weight bearing as tolerated    Mobility  Bed Mobility Overal bed mobility: Needs Assistance Bed Mobility: Rolling;Sidelying to Sit Rolling: Min assist Sidelying to sit: Min assist       General bed mobility comments: instructional cues and min A for step by step portion of each task  Transfers Overall transfer level: Needs assistance Equipment used: Rolling walker (2 wheeled) Transfers: Sit to/from Stand Sit to Stand: Mod assist;From elevated surface            Ambulation/Gait Ambulation/Gait assistance: Min assist Gait Distance (Feet): 10 Feet Assistive device: Rolling walker (2 wheeled) Gait Pattern/deviations: Step-to pattern;Decreased stride length;Trunk flexed;Wide base of  support     General Gait Details: mod to max instructional cues for each step with walker then L foot then R and manual facitilation for upright posture/cues for forward gaze   Stairs             Wheelchair Mobility    Modified Rankin (Stroke Patients Only)       Balance Overall balance assessment: Needs assistance Sitting-balance support: Feet supported Sitting balance-Leahy Scale: Poor Sitting balance - Comments: falling back sitting EOB unsupported, needs min to mod A for anterior weight shift     Standing balance-Leahy Scale: Poor Standing balance comment: bilat UE support and assist for balance; stood about 3 minutes while assisted for hygiene and replace brief                            Cognition Arousal/Alertness: Awake/alert Behavior During Therapy: Flat affect Overall Cognitive Status: No family/caregiver present to determine baseline cognitive functioning                                 General Comments: h/o dementia, easily anxious      Exercises      General Comments General comments (skin integrity, edema, etc.): VSS seated EOB and SpO2 95% on RA during session (on 3L O2 initially)      Pertinent Vitals/Pain Pain Assessment: Faces Faces Pain Scale: Hurts a little bit Pain Location: R shoulder, L toe Pain Descriptors / Indicators: Grimacing;Guarding Pain Intervention(s): Monitored during session;Repositioned  Home Living                      Prior Function            PT Goals (current goals can now be found in the care plan section) Progress towards PT goals: Progressing toward goals    Frequency    Min 3X/week      PT Plan Current plan remains appropriate    Co-evaluation              AM-PAC PT "6 Clicks" Mobility   Outcome Measure  Help needed turning from your back to your side while in a flat bed without using bedrails?: A Little Help needed moving from lying on your back to  sitting on the side of a flat bed without using bedrails?: A Little Help needed moving to and from a bed to a chair (including a wheelchair)?: A Lot Help needed standing up from a chair using your arms (e.g., wheelchair or bedside chair)?: A Lot Help needed to walk in hospital room?: A Lot Help needed climbing 3-5 steps with a railing? : Total 6 Click Score: 13    End of Session Equipment Utilized During Treatment: Oxygen Activity Tolerance: Patient limited by fatigue Patient left: in chair;with call bell/phone within reach   PT Visit Diagnosis: Other abnormalities of gait and mobility (R26.89);Muscle weakness (generalized) (M62.81)     Time: [id number] PT Time Calculation (min) (ACUTE ONLY): 27 min  Charges:  $Gait Training: 8-22 mins $Therapeutic Activity: 8-22 mins                     Magda Kiel, Virginia Holmes [phone] 06/18/2019    Reginia Naas 06/18/2019, 3:56 PM

## 2019-06-18 NOTE — Progress Notes (Signed)
1517: Attempted to call report to Ritta Slot, Network engineer will have nurse call back for report, gave information.

## 2019-06-19 LAB — CULTURE, BLOOD (ROUTINE X 2)
Culture: NO GROWTH
Culture: NO GROWTH

## 2019-08-18 ENCOUNTER — Emergency Department (HOSPITAL_COMMUNITY): Payer: Medicare Other

## 2019-08-18 ENCOUNTER — Other Ambulatory Visit: Payer: Self-pay

## 2019-08-18 ENCOUNTER — Emergency Department (HOSPITAL_COMMUNITY)
Admission: EM | Admit: 2019-08-18 | Discharge: 2019-08-18 | Disposition: A | Discharge status: Home / Self Care | Payer: Medicare Other | Attending: Emergency Medicine | Admitting: Emergency Medicine

## 2019-08-18 ENCOUNTER — Encounter (HOSPITAL_COMMUNITY): Payer: Self-pay | Admitting: Internal Medicine

## 2019-08-18 ENCOUNTER — Encounter (HOSPITAL_COMMUNITY): Payer: Self-pay | Admitting: Emergency Medicine

## 2019-08-18 DIAGNOSIS — R519 Headache, unspecified: Secondary | ICD-10-CM | POA: Diagnosis not present

## 2019-08-18 DIAGNOSIS — S99922A Unspecified injury of left foot, initial encounter: Secondary | ICD-10-CM | POA: Diagnosis present

## 2019-08-18 DIAGNOSIS — W010XXA Fall on same level from slipping, tripping and stumbling without subsequent striking against object, initial encounter: Secondary | ICD-10-CM | POA: Diagnosis not present

## 2019-08-18 DIAGNOSIS — Y92129 Unspecified place in nursing home as the place of occurrence of the external cause: Secondary | ICD-10-CM | POA: Diagnosis not present

## 2019-08-18 DIAGNOSIS — S92415A Nondisplaced fracture of proximal phalanx of left great toe, initial encounter for closed fracture: Secondary | ICD-10-CM | POA: Diagnosis not present

## 2019-08-18 DIAGNOSIS — Y939 Activity, unspecified: Secondary | ICD-10-CM | POA: Diagnosis not present

## 2019-08-18 DIAGNOSIS — Y999 Unspecified external cause status: Secondary | ICD-10-CM | POA: Diagnosis not present

## 2019-08-18 DIAGNOSIS — W19XXXA Unspecified fall, initial encounter: Secondary | ICD-10-CM

## 2019-08-18 DIAGNOSIS — Z7901 Long term (current) use of anticoagulants: Secondary | ICD-10-CM | POA: Insufficient documentation

## 2019-08-18 MED ORDER — ACETAMINOPHEN 500 MG PO TABS
1000.0000 mg | ORAL_TABLET | Freq: Once | ORAL | Status: AC
  Administered 2019-08-18: 1000 mg via ORAL
  Filled 2019-08-18: qty 2

## 2019-08-18 NOTE — ED Provider Notes (Signed)
[facility] Provider Note   CSN: [id number] Arrival date & time: 08/18/19  0042     History Chief Complaint  Patient presents with  . Fall    Level 1    Tracy Rodriguez is a 86 y.o. female.  The history is provided by the EMS personnel and medical records. The history is limited by the condition of the patient.  Fall This is a new problem. The current episode started less than 1 hour ago. The problem occurs constantly. The problem has not changed since onset.Pertinent negatives include no chest pain, no abdominal pain and no headaches. Nothing aggravates the symptoms. Nothing relieves the symptoms. She has tried nothing for the symptoms. The treatment provided no relief.       History reviewed. No pertinent past medical history.  There are no problems to display for this patient.   History reviewed. No pertinent surgical history.   OB History   No obstetric history on file.     History reviewed. No pertinent family history.  Social History   Tobacco Use  . Smoking status: Not on file  Substance Use Topics  . Alcohol use: Not on file  . Drug use: Not on file    Home Medications Prior to Admission medications   Not on File    Allergies    Codeine, Levsin [hyoscyamine], Morphine and related, Penicillins, Sulfa antibiotics, Valium [diazepam], and Zantac [ranitidine hcl]  Review of Systems   Review of Systems  Constitutional: Negative for fever.  HENT: Negative for facial swelling.   Eyes: Negative for visual disturbance.  Respiratory: Negative for cough.   Cardiovascular: Negative for chest pain.  Gastrointestinal: Negative for abdominal pain.  Genitourinary: Negative for difficulty urinating.  Musculoskeletal: Negative for back pain and neck pain.  Skin: Negative for wound.  Neurological: Negative for headaches.  Psychiatric/Behavioral: Negative for agitation.    Physical Exam Updated Vital Signs BP 122/72    Pulse 62   Temp 98.3 F (36.8 C)   Resp (!) 21   Ht 5\' 6"  (1.676 m)   Wt 81.6 kg   SpO2 96%   BMI 29.05 kg/m   Physical Exam Vitals and nursing note reviewed.  Constitutional:      General: She is not in acute distress.    Appearance: Normal appearance.  HENT:     Head: Normocephalic and atraumatic.     Right Ear: Tympanic membrane normal.     Left Ear: Tympanic membrane normal.     Nose: Nose normal.  Eyes:     Conjunctiva/sclera: Conjunctivae normal.     Pupils: Pupils are equal, round, and reactive to light.  Cardiovascular:     Rate and Rhythm: Normal rate and regular rhythm.     Pulses: Normal pulses.     Heart sounds: Normal heart sounds.  Pulmonary:     Effort: Pulmonary effort is normal.     Breath sounds: Normal breath sounds.  Abdominal:     General: Abdomen is flat. Bowel sounds are normal.     Tenderness: There is no abdominal tenderness. There is no guarding.  Musculoskeletal:        General: Normal range of motion.     Cervical back: Normal range of motion and neck supple.  Skin:    General: Skin is warm and dry.     Capillary Refill: Capillary refill takes less than 2 seconds.  Neurological:     General: No focal deficit present.  Mental Status: She is alert and oriented to person, place, and time.     Deep Tendon Reflexes: Reflexes normal.  Psychiatric:        Mood and Affect: Mood normal.        Behavior: Behavior normal.     ED Results / Procedures / Treatments   Labs (all labs ordered are listed, but only abnormal results are displayed) Labs Reviewed - No data to display  EKG None  Radiology No results found.  Procedures Procedures (including critical care time)  Medications Ordered in ED Medications  acetaminophen (TYLENOL) tablet 1,000 mg (has no administration in time range)    ED Course  I have reviewed the triage vital signs and the nursing notes.  Pertinent labs & imaging results that were available during my care of  the patient were reviewed by me and considered in my medical decision making (see chart for details).  Treated with post op shoe in the ED.  WIll refer to orthopedics for ongoing care.  Ice and elevation.    Tracy Rodriguez was evaluated in Emergency Department on 08/18/2019 for the symptoms described in the history of present illness. She was evaluated in the context of the global COVID-19 pandemic, which necessitated consideration that the patient might be at risk for infection with the SARS-CoV-2 virus that causes COVID-19. Institutional protocols and algorithms that pertain to the evaluation of patients at risk for COVID-19 are in a state of rapid change based on information released by regulatory bodies including the CDC and federal and state organizations. These policies and algorithms were followed during the patient's care in the ED.  Final Clinical Impression(s) / ED Diagnoses Return for intractable cough, coughing up blood,fevers >100.4 unrelieved by medication, shortness of breath, intractable vomiting, chest pain, shortness of breath, weakness,numbness, changes in speech, facial asymmetry,abdominal pain, passing out,Inability to tolerate liquids or food, cough, altered mental status or any concerns. No signs of systemic illness or infection. The patient is nontoxic-appearing on exam and vital signs are within normal limits.   I have reviewed the triage vital signs and the nursing notes. Pertinent labs &imaging results that were available during my care of the patient were reviewed by me and considered in my medical decision making (see chart for details).After history, exam, and medical workup I feel the patient has beenappropriately medically screened and is safe for discharge home. Pertinent diagnoses were discussed with the patient. Patient was given return precautions.      Tracy Mazurkiewicz, MD 08/18/19 6415

## 2019-08-18 NOTE — ED Notes (Signed)
Called [facility] to transport patient to the [facility]

## 2019-08-18 NOTE — ED Triage Notes (Signed)
Pt BIB [facility] from [facility] d/t fall landing on her left side. Pt states she slipped on water. Pt takes blood thinner. Denies LOC. No obvious injury. No obvious brushing. Presents to ED A&Ox4, able to follow commands, neuro intact. No SOB/CP/Abd tenderness.  C/O left and leg lower leg pain states "they're always tender."

## 2019-08-18 NOTE — Progress Notes (Signed)
Orthopedic Tech Progress Note Patient Details:  Tracy Rodriguez Mar 31, 1931 [id number]  Level 2 Trauma: Fall Nurse requested for me to place post op show on patients left foot.  Ortho Devices Type of Ortho Device: Postop shoe/boot Ortho Device/Splint Location: Lower Left Extremity Ortho Device/Splint Interventions: Ordered, Application   Post Interventions Patient Tolerated: Well Instructions Provided: Adjustment of device, Care of device, Poper ambulation with device   [name] P Lorel Monaco 08/18/2019, 4:07 AM

## 2019-08-18 NOTE — ED Notes (Signed)
Pt leaving facility via [facility]. Attempted to inform facility of pt's departure. No answer.

## 2019-08-18 NOTE — ED Notes (Signed)
Pt to CT

## 2019-08-24 ENCOUNTER — Encounter: Payer: Self-pay | Admitting: Physician Assistant

## 2019-08-24 ENCOUNTER — Other Ambulatory Visit: Payer: Self-pay

## 2019-08-24 ENCOUNTER — Ambulatory Visit: Payer: BC Managed Care – PPO | Admitting: Family Medicine

## 2019-08-24 ENCOUNTER — Ambulatory Visit (INDEPENDENT_AMBULATORY_CARE_PROVIDER_SITE_OTHER): Payer: Medicare Other | Admitting: Physician Assistant

## 2019-08-24 VITALS — Ht 66.0 in | Wt 180.0 lb

## 2019-08-24 DIAGNOSIS — S92415A Nondisplaced fracture of proximal phalanx of left great toe, initial encounter for closed fracture: Secondary | ICD-10-CM | POA: Diagnosis not present

## 2019-08-24 NOTE — Progress Notes (Signed)
Office Visit Note   Patient: Tracy Rodriguez           Date of Birth: [birth year]           MRN: [id number] Visit Date: 08/24/2019              Requested by: No referring provider defined for this encounter. PCP: System, Pcp Not In  Chief Complaint  Patient presents with  . Left Foot - Pain, Injury, New Patient (Initial Visit)      HPI: This is a pleasant 86 year old woman who lives at [facility] who is 6 days status post fall after she relates to swelling slipping on some water.  She was seen and evaluated in the emergency room.  She was diagnosed with a ankle sprain and great toe fracture and follows up today.  She is a poor historian.  She points to pain in her great toe and across her midfoot.  Assessment & Plan: Visit Diagnoses: No diagnosis found.  Plan: She may be weightbearing as tolerated in her postop shoe.  I have written orders for her nursing facility to place her in bilateral knee-high compression hose follow-up in 3 weeks with new x-rays of her left foot  Follow-Up Instructions: No follow-ups on file.   Ortho Exam  Patient is alert, oriented, no adenopathy, well-dressed, normal affect, normal respiratory effort. Left foot: Moderate soft tissue swelling.  Pulses are intact.  She has tenderness over the base of the great toe.  Also some tenderness over the dorsal midfoot and quite a bit of swelling.  No plantar ecchymosis.  Skin is in good condition without any fracture blisters.  Ankle range of motion is nonpainful x-rays reviewed emergency room demonstrate a fracture of the proximal phalanx of the great toe of the left foot in good position.  Ankle x-rays did not demonstrate any abnormalities she does have moderate soft tissue swelling in both her feet and ankles no ulcers  Imaging: No results found. No images are attached to the encounter.  Labs: Lab Results  Component Value Date   HGBA1C 5.2 02/08/2016   REPTSTATUS 06/16/2019 FINAL 06/15/2019   CULT (A)  06/15/2019    <10,000 COLONIES/mL INSIGNIFICANT GROWTH Performed at [facility], [place] [address], [place], [place] [address]    LABORGA CITROBACTER FREUNDII (A) 05/24/2018   LABORGA PROTEUS MIRABILIS (A) 05/24/2018     Lab Results  Component Value Date   ALBUMIN 3.7 06/14/2019   ALBUMIN 3.8 05/24/2018   ALBUMIN 3.0 (L) 03/03/2016   PREALBUMIN 18.3 10/03/2016    Lab Results  Component Value Date   MG 2.1 06/16/2019   MG 2.1 10/03/2016   MG 1.9 03/08/2016   No results found for: VD25OH  Lab Results  Component Value Date   PREALBUMIN 18.3 10/03/2016   CBC EXTENDED Latest Ref Rng & Units 06/17/2019 06/16/2019 06/15/2019  WBC 4.0 - 10.5 K/uL 12.0(H) 14.5(H) 19.7(H)  RBC 3.87 - 5.11 MIL/uL 3.91 3.71(L) 4.47  HGB 12.0 - 15.0 g/dL 12.1 11.6(L) 13.8  HCT 36 - 46 % 37.6 35.4(L) 42.2  PLT 150 - 400 K/uL 204 203 247  NEUTROABS 1.7 - 7.7 K/uL 9.4(H) - -  LYMPHSABS 0.7 - 4.0 K/uL 1.2 - -     Body mass index is 29.05 kg/m.  Orders:  No orders of the defined types were placed in this encounter.  No orders of the defined types were placed in this encounter.    Procedures: No procedures performed  Clinical Data: No additional findings.  ROS:  All other systems negative, except as noted in the HPI. Review of Systems  Objective: Vital Signs: Ht 5\' 6"  (1.676 m)   Wt 180 lb (81.6 kg)   LMP 10/10/1969   BMI 29.05 kg/m   Specialty Comments:  No specialty comments available.  PMFS History: Patient Active Problem List   Diagnosis Date Noted  . Epistaxis 06/15/2019  . Sepsis ([place]) 06/14/2019  . Functional dyspnea (2/2 posture/positioning w/ abnormal sprirometry) 10/03/2016  . Bipolar 1 disorder ([place]) 10/03/2016  . PAF (paroxysmal atrial fibrillation) ([place]) 10/03/2016  . Hypertension 10/03/2016  . Orthostatic syncope 10/03/2016  . Diarrhea 10/03/2016  . Chronic diastolic heart failure ([place]) 10/03/2016  . Supratherapeutic INR 10/03/2016  . Dementia ([place])  10/03/2016  . Hyperlipemia 10/03/2016  . CKD (chronic kidney disease) stage 3, GFR 30-59 ml/min 10/03/2016  . Symptomatic bradycardia   . Dyspnea 09/16/2016  . CHF (congestive heart failure) ([place]) 03/04/2016  . Acute renal failure superimposed on stage 3 chronic kidney disease ([place]) 02/08/2016  . Fall   . Syncope 12/05/2015  . Physical deconditioning 12/05/2015  . Encounter for therapeutic drug monitoring 12/22/2014  . Atrial fibrillation with RVR ([place]) 08/04/2014  . Paroxysmal atrial fibrillation ([place]) 08/04/2014  . Hypertensive urgency   . Obesity 09/27/2013  . Chronic back pain 09/14/2012  . Bipolar disorder ([place]) 09/14/2012   Past Medical History:  Diagnosis Date  . Arthritis    "joints" (08/04/2014)  . Bipolar 1 disorder ()   . Cancer of right breast ([place])   . CHF (congestive heart failure) ([place])   . Chronic lower back pain   . Hyperlipemia   . Hypertension   . PAF (paroxysmal atrial fibrillation) ([facility])     Family History  Problem Relation Age of Onset  . Stroke Mother   . Heart disease Father   . Alzheimer's disease Sister   . Leukemia Brother   . Arthritis Maternal Grandmother   . Breast cancer Daughter     Past Surgical History:  Procedure Laterality Date  . AUGMENTATION MAMMAPLASTY Bilateral   . BACK SURGERY    . BREAST BIOPSY Right   . BREAST RECONSTRUCTION Bilateral   . CATARACT EXTRACTION W/ INTRAOCULAR LENS  IMPLANT, BILATERAL Bilateral   . EXCISIONAL HEMORRHOIDECTOMY    . [place]; 1973; 1985   "ruptured discs each time"  . MASTECTOMY Right    cancer  . PLACEMENT OF BREAST IMPLANTS Bilateral    "had to take tissue out of left"  . TONSILLECTOMY    . VAGINAL HYSTERECTOMY     Social History   Occupational History  . Occupation: retired  Tobacco Use  . Smoking status: Never Smoker  . Smokeless tobacco: Never Used  Vaping Use  . Vaping Use: Never used  Substance and Sexual Activity  . Alcohol use: No  . Drug use: No  .  Sexual activity: Not Currently

## 2019-09-14 ENCOUNTER — Ambulatory Visit: Payer: BC Managed Care – PPO | Admitting: Physician Assistant

## 2019-10-05 IMAGING — CT CT HEAD WITHOUT CONTRAST
3 series · 15 of 47 positions shown, 18 images · non-contrast
Comparison: 12/11/2017

CLINICAL DATA: Unwitnessed fall.  Found on the floor.

EXAM:
CT HEAD WITHOUT CONTRAST
TECHNIQUE: Contiguous axial images were obtained from the base of the skull
through the vertex without intravenous contrast.

[Series 3: head 5.0 h30s · axial · 0.43mm/px · z∈[-59,+81]mm · 9 of 34 slices shown, 12 images]
[im 3/34  brain]
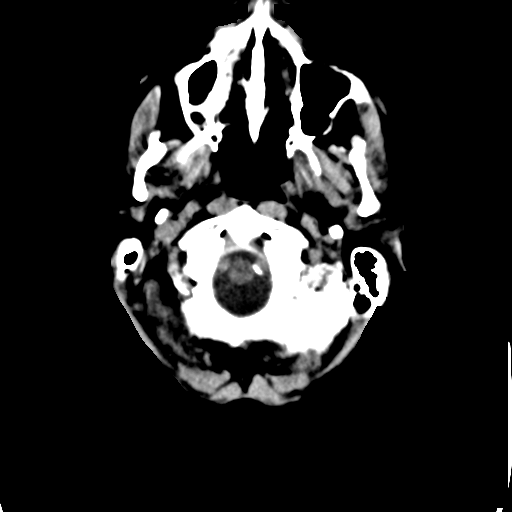
[im 3/34  bone]
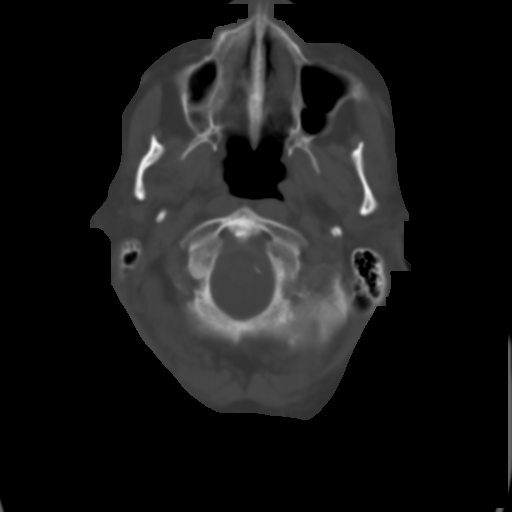
[im 6/34  brain]
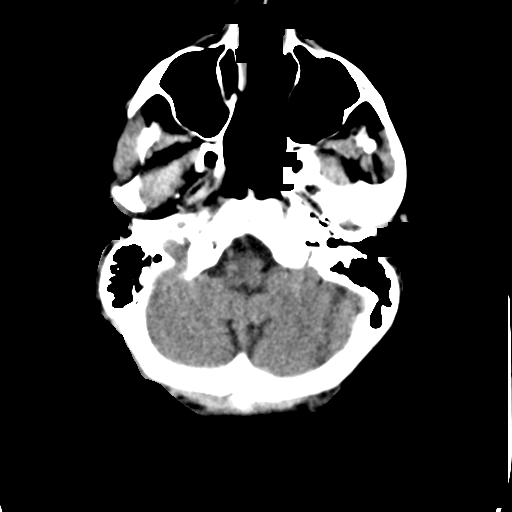
[im 10/34  brain]
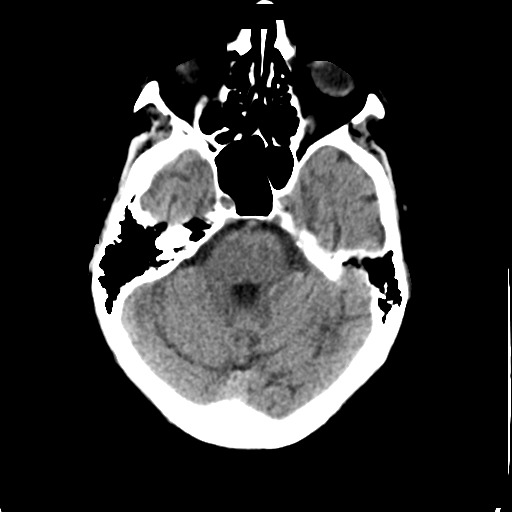
[im 13/34  brain]
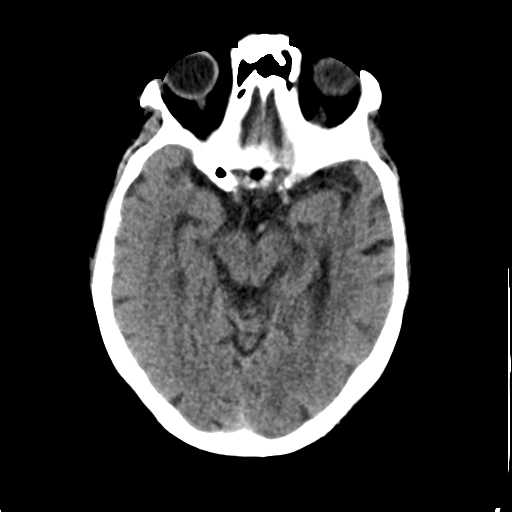
[im 18/34  brain]
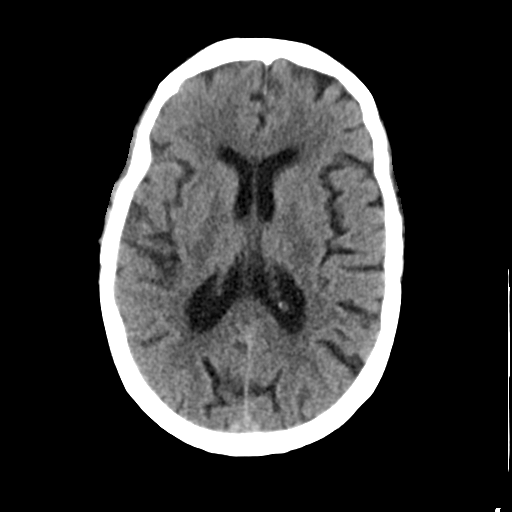
[im 18/34  bone]
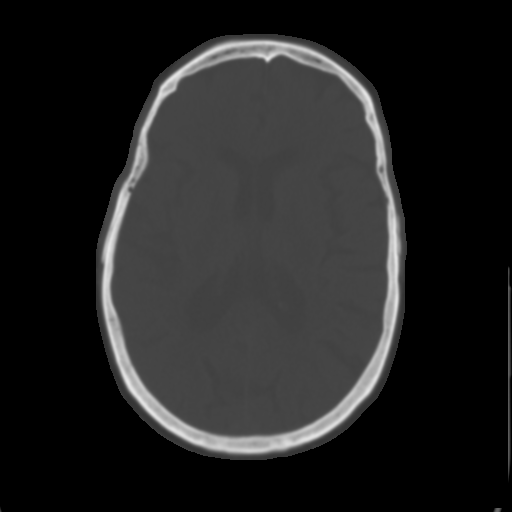
[im 21/34  brain]
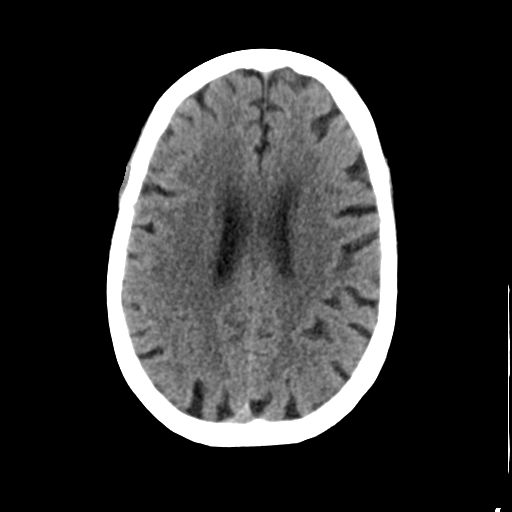
[im 24/34  brain]
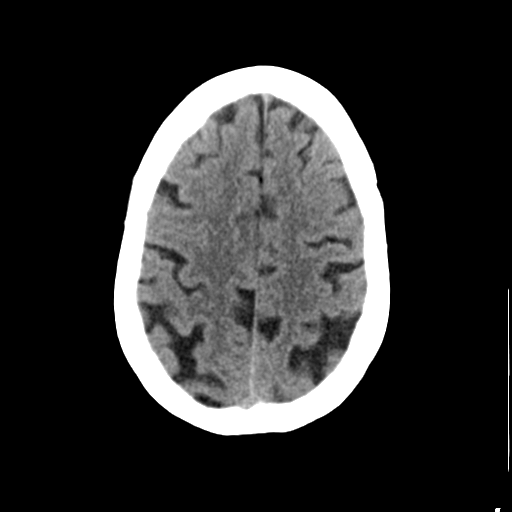
[im 28/34  brain]
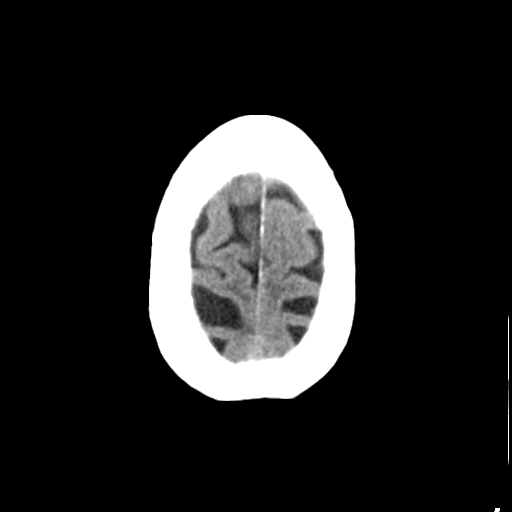
[im 31/34  brain]
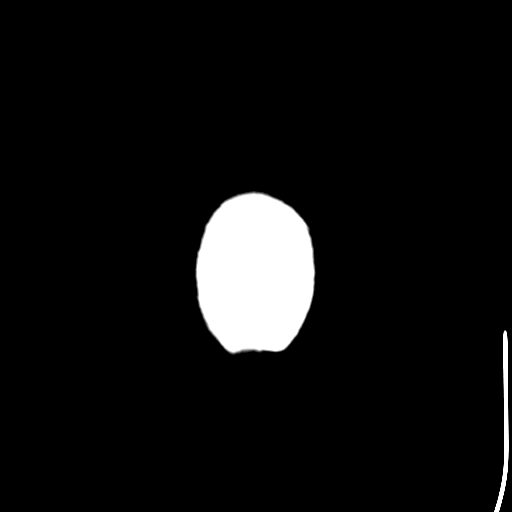
[im 31/34  bone]
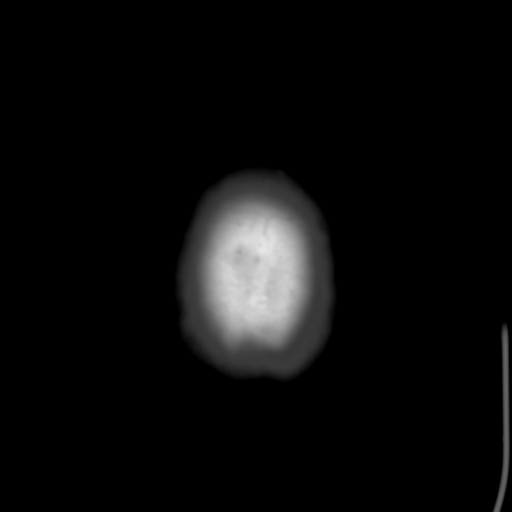

[Series 5: head 3.0 mpr cor · coronal · 0.34mm/px · 3 of 71 slices shown]
[im 24/71  brain]
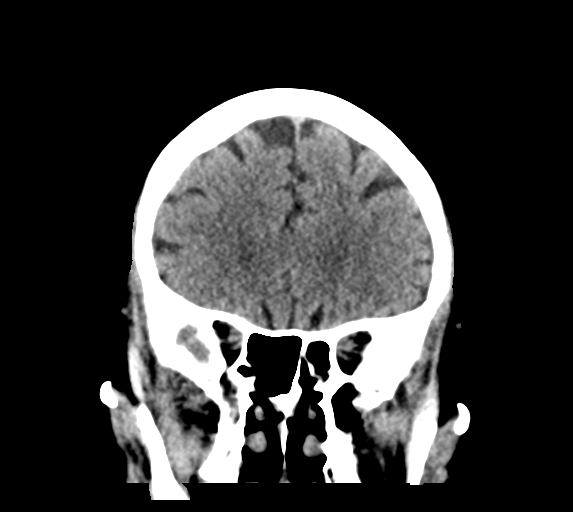
[im 32/71  brain]
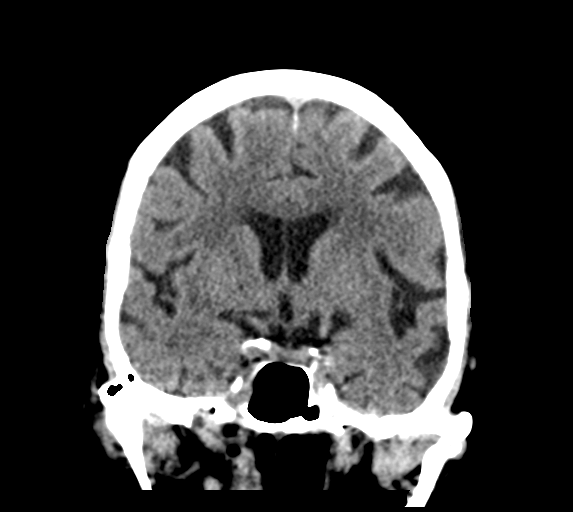
[im 39/71  brain]
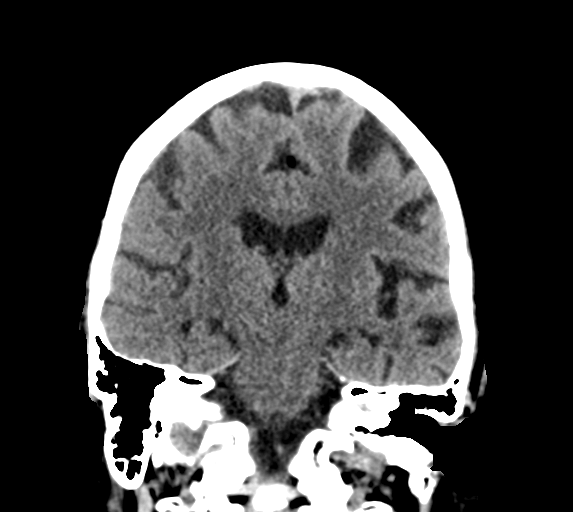

[Series 6: head 3.0 mpr sag · sagittal · 0.32mm/px · 3 of 64 slices shown]
[im 22/64  brain]
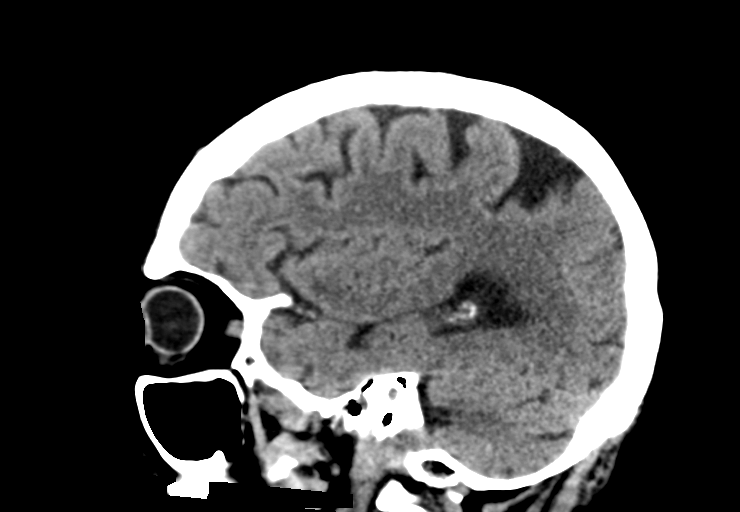
[im 32/64  brain]
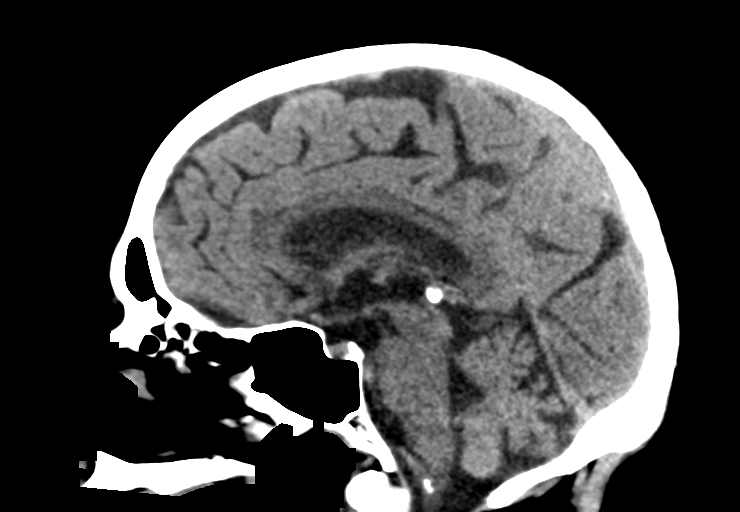
[im 43/64  brain]
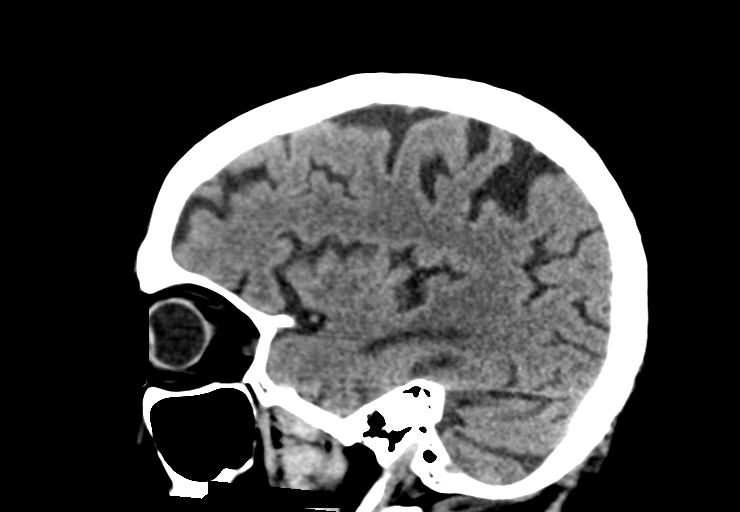

[15 of 47 positions shown; findings below may reference images not displayed]

FINDINGS: Brain: Generalized atrophy. Chronic small-vessel ischemic changes of
the hemispheric white matter. No sign of acute infarction, mass
lesion, hemorrhage, hydrocephalus or extra-axial collection.

Vascular: There is atherosclerotic calcification of the major
vessels at the base of the brain.

Skull: Negative

Sinuses/Orbits: Clear/normal

Other: None
IMPRESSION: No acute or traumatic finding. Age related atrophy with chronic
small-vessel ischemic changes of the white matter.

## 2023-12-28 ENCOUNTER — Emergency Department (HOSPITAL_COMMUNITY)
Admission: EM | Admit: 2023-12-28 | Discharge: 2023-12-28 | Disposition: A | Discharge status: Skilled Nursing Facility | Source: Skilled Nursing Facility | Attending: Emergency Medicine | Admitting: Emergency Medicine

## 2023-12-28 ENCOUNTER — Emergency Department (HOSPITAL_COMMUNITY)

## 2023-12-28 ENCOUNTER — Other Ambulatory Visit: Payer: Self-pay

## 2023-12-28 DIAGNOSIS — R1030 Lower abdominal pain, unspecified: Secondary | ICD-10-CM | POA: Diagnosis present

## 2023-12-28 DIAGNOSIS — S0003XA Contusion of scalp, initial encounter: Secondary | ICD-10-CM | POA: Diagnosis not present

## 2023-12-28 DIAGNOSIS — I4891 Unspecified atrial fibrillation: Secondary | ICD-10-CM | POA: Insufficient documentation

## 2023-12-28 DIAGNOSIS — R600 Localized edema: Secondary | ICD-10-CM | POA: Diagnosis not present

## 2023-12-28 DIAGNOSIS — Z79899 Other long term (current) drug therapy: Secondary | ICD-10-CM | POA: Diagnosis not present

## 2023-12-28 DIAGNOSIS — I10 Essential (primary) hypertension: Secondary | ICD-10-CM | POA: Insufficient documentation

## 2023-12-28 DIAGNOSIS — N3000 Acute cystitis without hematuria: Secondary | ICD-10-CM | POA: Insufficient documentation

## 2023-12-28 DIAGNOSIS — W19XXXA Unspecified fall, initial encounter: Secondary | ICD-10-CM

## 2023-12-28 DIAGNOSIS — Y92410 Unspecified street and highway as the place of occurrence of the external cause: Secondary | ICD-10-CM | POA: Insufficient documentation

## 2023-12-28 DIAGNOSIS — Z7901 Long term (current) use of anticoagulants: Secondary | ICD-10-CM | POA: Diagnosis not present

## 2023-12-28 LAB — URINALYSIS, ROUTINE W REFLEX MICROSCOPIC
Bilirubin Urine: NEGATIVE
Glucose, UA: NEGATIVE mg/dL
Ketones, ur: NEGATIVE mg/dL
Nitrite: NEGATIVE
Protein, ur: NEGATIVE mg/dL
Specific Gravity, Urine: 1.012 (ref 1.005–1.030)
WBC, UA: >50 WBC/hpf (ref 0–5)
pH: 8 (ref 5.0–8.0)

## 2023-12-28 LAB — COMPREHENSIVE METABOLIC PANEL WITH GFR
ALT: 10 U/L (ref 0–44)
AST: 23 U/L (ref 15–41)
Albumin: 3.1 g/dL — ABNORMAL LOW (ref 3.5–5.0)
Alkaline Phosphatase: 61 U/L (ref 38–126)
Anion gap: 13 (ref 5–15)
BUN: 26 mg/dL — ABNORMAL HIGH (ref 8–23)
CO2: 28 mmol/L (ref 22–32)
Calcium: 8.9 mg/dL (ref 8.9–10.3)
Chloride: 96 mmol/L — ABNORMAL LOW (ref 98–111)
Creatinine, Ser: 1.55 mg/dL — ABNORMAL HIGH (ref 0.44–1.00)
GFR, Estimated: 32 mL/min — ABNORMAL LOW (ref >60)
Glucose, Bld: 94 mg/dL (ref 70–99)
Potassium: 3.1 mmol/L — ABNORMAL LOW (ref 3.5–5.1)
Sodium: 137 mmol/L (ref 135–145)
Total Bilirubin: 0.8 mg/dL (ref 0.0–1.2)
Total Protein: 7.1 g/dL (ref 6.5–8.1)

## 2023-12-28 LAB — I-STAT CHEM 8, ED
BUN: 27 mg/dL — ABNORMAL HIGH (ref 8–23)
Calcium, Ion: 1.05 mmol/L — ABNORMAL LOW (ref 1.15–1.40)
Chloride: 96 mmol/L — ABNORMAL LOW (ref 98–111)
Creatinine, Ser: 1.6 mg/dL — ABNORMAL HIGH (ref 0.44–1.00)
Glucose, Bld: 91 mg/dL (ref 70–99)
HCT: 35 % — ABNORMAL LOW (ref 36.0–46.0)
Hemoglobin: 11.9 g/dL — ABNORMAL LOW (ref 12.0–15.0)
Potassium: 3.1 mmol/L — ABNORMAL LOW (ref 3.5–5.1)
Sodium: 138 mmol/L (ref 135–145)
TCO2: 29 mmol/L (ref 22–32)

## 2023-12-28 LAB — TROPONIN I (HIGH SENSITIVITY)
Troponin I (High Sensitivity): 33 ng/L — ABNORMAL HIGH (ref <18)
Troponin I (High Sensitivity): 27 ng/L — ABNORMAL HIGH (ref <18)

## 2023-12-28 LAB — CBC
HCT: 35.1 % — ABNORMAL LOW (ref 36.0–46.0)
Hemoglobin: 10.9 g/dL — ABNORMAL LOW (ref 12.0–15.0)
MCH: 26.8 pg (ref 26.0–34.0)
MCHC: 31.1 g/dL (ref 30.0–36.0)
MCV: 86.2 fL (ref 80.0–100.0)
Platelets: 234 K/uL (ref 150–400)
RBC: 4.07 MIL/uL (ref 3.87–5.11)
RDW: 15.8 % — ABNORMAL HIGH (ref 11.5–15.5)
WBC: 9.1 K/uL (ref 4.0–10.5)
nRBC: 0 % (ref 0.0–0.2)

## 2023-12-28 LAB — PROTIME-INR
INR: 1.8 — ABNORMAL HIGH (ref 0.8–1.2)
Prothrombin Time: 22.3 s — ABNORMAL HIGH (ref 11.4–15.2)

## 2023-12-28 LAB — ETHANOL: Alcohol, Ethyl (B): <15 mg/dL (ref <15)

## 2023-12-28 MED ORDER — SODIUM CHLORIDE 0.9 % IV SOLN
1.0000 g | Freq: Once | INTRAVENOUS | Status: AC
  Administered 2023-12-28: 1 g via INTRAVENOUS
  Filled 2023-12-28: qty 10

## 2023-12-28 MED ORDER — CEPHALEXIN 500 MG PO CAPS: 500.0000 mg | ORAL_CAPSULE | Freq: Three times a day (TID) | ORAL | 0 refills | Status: AC

## 2023-12-28 MED ORDER — ACETAMINOPHEN 325 MG PO TABS
650.0000 mg | ORAL_TABLET | Freq: Once | ORAL | Status: AC
  Administered 2023-12-28: 650 mg via ORAL
  Filled 2023-12-28: qty 2

## 2023-12-28 MED ORDER — IOHEXOL 350 MG/ML SOLN
75.0000 mL | Freq: Once | INTRAVENOUS | Status: AC | PRN
  Administered 2023-12-28: 75 mL via INTRAVENOUS

## 2023-12-28 MED ORDER — FENTANYL CITRATE (PF) 50 MCG/ML IJ SOSY
50.0000 ug | PREFILLED_SYRINGE | Freq: Once | INTRAMUSCULAR | Status: AC
  Administered 2023-12-28: 50 ug via INTRAVENOUS
  Filled 2023-12-28: qty 1

## 2023-12-28 NOTE — ED Provider Notes (Signed)
 [place] [facility]GENC[facility] DEPARTMENT AT [facility] Provider Note   CSN: [id number] Arrival date & time: 12/28/23  1524     Patient presents with: fall on thinners   Tracy Rodriguez is a 89 y.o. female history of A-fib on Eliquis , hypertension, here presenting with fall.  Patient is from nursing home and was on the scooter and slid down and fell and hit her head.  Patient was noted to be hypotensive with blood pressure in the 90s per EMS.  Patient complains of lower abdominal pain.  No meds given prior to arrival.  C-collar placed by EMS.   The history is provided by the patient.       Prior to Admission medications   Medication Sig Start Date End Date Taking? Authorizing Provider  acetaminophen  (TYLENOL ) 500 MG tablet Take 500 mg by mouth See admin instructions. Take one tablet (500 mg) by mouth every 6 hours - scheduled; may also take 1 tablet (500 mg) every 4 hours as needed for headache, minor discomfort and fever 99.5-101 F - do not exceed 2000 mg/24 hours    [provider]  acetaminophen  (TYLENOL ) 500 MG tablet Take 500 mg by mouth every 4 (four) hours as needed for mild pain.    [provider]  alum & mag hydroxide-simeth (MAALOX/MYLANTA) 200-200-20 MG/5ML suspension Take 30 mLs by mouth daily as needed for indigestion or heartburn.     [provider]  alum & mag hydroxide-simeth (MAALOX/MYLANTA) 200-200-20 MG/5ML suspension Take 30 mLs by mouth 3 (three) times daily as needed for indigestion or heartburn.    [provider]  atorvastatin  (LIPITOR) 40 MG tablet Take 40 mg by mouth at bedtime.     [provider]  atorvastatin  (LIPITOR) 40 MG tablet Take 40 mg by mouth daily. 07/29/19   [provider]  carboxymethylcellulose (REFRESH PLUS) 0.5 % SOLN Place 1 drop into both eyes 4 (four) times daily.    [provider]  Carboxymethylcellulose Sod PF 0.5 % SOLN Place 1 drop into both eyes 4 (four) times daily.     [provider]  carvedilol  (COREG ) 3.125 MG tablet Take 1 tablet (3.125 mg total) by mouth 2 (two) times daily with a meal. PLEASE START THE LOWER DOSE ON Monday, 7/30 DEPENDING ON HER HEART RATE. OK TO START IF HEART RATE IS NOT LESS THAN 65. Patient taking differently: Take 3.125 mg by mouth 2 (two) times daily with a meal.  10/07/16   Verdene Purchase, MD  carvedilol  (COREG ) 3.125 MG tablet Take 3.125 mg by mouth 2 (two) times daily. 07/29/19   [provider]  cephALEXin  (KEFLEX ) 500 MG capsule Take 1 capsule (500 mg total) by mouth every 12 (twelve) hours. 06/18/19   Akula, Vijaya, MD  Cholecalciferol  (VITAMIN D ) 50 MCG (2000 UT) tablet Take 2,000 Units by mouth daily.    [provider]  Cranberry-Vitamin C-Probiotic (AZO CRANBERRY) 250-30 MG TABS Take 1 tablet by mouth daily.    [provider]  digoxin  (LANOXIN ) 0.125 MG tablet Take 0.125 mg by mouth every other day. Hold if  pulse is less than 60    [provider]  digoxin  (LANOXIN ) 0.125 MG tablet Take 1 tablet by mouth every other day. 07/16/19   [provider]  diltiazem  (CARDIZEM  CD) 120 MG 24 hr capsule Take 120 mg daily by mouth.    [provider]  diltiazem  (CARDIZEM  CD) 120 MG 24 hr capsule Take 1 capsule by mouth daily. 07/29/19  [provider]  ergocalciferol  (VITAMIN D2) 1.25 MG (50000 UT) capsule Take 50,000 Units by mouth daily.    [provider]  ergocalciferol  (VITAMIN D2) 1.25 MG (50000 UT) capsule Take 50,000 Units by mouth once a week.    [provider]  escitalopram  (LEXAPRO ) 10 MG tablet TAKE 1 TABLET(10 MG) BY MOUTH DAILY Patient taking differently: Take 10 mg by mouth daily.  09/29/17   Jame Maude FALCON, MD  escitalopram  (LEXAPRO ) 10 MG tablet Take 10 mg by mouth daily. 07/29/19   [provider]  furosemide  (LASIX ) 20 MG tablet Take 20 mg by mouth daily. 04/16/18   [provider]  furosemide  (LASIX ) 20 MG  tablet Take 20 mg by mouth daily. 07/29/19   [provider]  gabapentin  (NEURONTIN ) 100 MG capsule Take 100 mg by mouth 3 (three) times daily.     [provider]  gabapentin  (NEURONTIN ) 100 MG capsule Take 100 mg by mouth 3 (three) times daily. 07/29/19   [provider]  guaifenesin  (ROBITUSSIN) 100 MG/5ML syrup Take 200 mg by mouth every 6 (six) hours as needed for cough.    [provider]  guaifenesin  (ROBITUSSIN) 100 MG/5ML syrup Take 200 mg by mouth every 6 (six) hours as needed for cough.    [provider]  Lidocaine -Glycerin (PREPARATION H EX) Place 1 application rectally daily as needed (Hemorhhoids).    [provider]  loperamide (IMODIUM) 2 MG capsule Take 2 mg by mouth as needed for diarrhea or loose stools (do not exceed 8 doses in 24 hours).     [provider]  loperamide (IMODIUM) 2 MG capsule Take 2 mg by mouth as needed for diarrhea or loose stools. Take 1 capsule with each loose stool as needed, not to exceed 8 doses in 24 hours.    [provider]  magnesium  hydroxide (MILK OF MAGNESIA) 400 MG/5ML suspension Take 30 mLs by mouth at bedtime as needed (constipation).     [provider]  magnesium  hydroxide (MILK OF MAGNESIA) 400 MG/5ML suspension Take 30 mLs by mouth at bedtime as needed for mild constipation.    [provider]  Melatonin 3 MG TABS Take 3 mg by mouth at bedtime.    [provider]  methocarbamol  (ROBAXIN ) 500 MG tablet Take 500 mg by mouth 2 (two) times daily as needed for muscle spasms.    [provider]  methocarbamol  (ROBAXIN ) 500 MG tablet Take 500 mg by mouth 2 (two) times daily as needed for muscle spasms.    [provider]  neomycin-bacitracin-polymyxin (NEOSPORIN) OINT Apply 1 application topically as needed (skin tears/abrasions).    [provider]  Neomycin-Bacitracin-Polymyxin (TRIPLE ANTIBIOTIC) [phone] OINT Apply 1  application topically daily as needed (Skin abrasions and tears.).    [provider]  ondansetron  (ZOFRAN ) 4 MG tablet Take 4 mg by mouth every 4 (four) hours as needed for nausea or vomiting.    [provider]  ondansetron  (ZOFRAN ) 4 MG tablet Take 4 mg by mouth every 4 (four) hours as needed for nausea/vomiting. 07/16/19   [provider]  oxymetazoline  (AFRIN) 0.05 % nasal spray Place 1-2 sprays into both nostrils 3 (three) times daily as needed (nosebleeds).    [provider]  oxymetazoline  (AFRIN) 0.05 % nasal spray Place 1-2 sprays into both nostrils 3 (three) times daily as needed for congestion (Nose bleed).    [provider]  Pramox-PE-Glycerin-Petrolatum  (PREPARATION H) 1-0.25-14.4-15 % CREA Place 1 application  rectally daily as needed (hemorrhoids).    [provider]  Rivaroxaban  (XARELTO ) 15 MG TABS tablet Take 1 tablet (15 mg total) by mouth daily. HOLD FOR ONE WEEK AND FOLLOW UP WITH pcp before resuming it. 06/18/19   Akula, Vijaya, MD  senna-docusate (SENOKOT-S) 8.6-50 MG tablet Take 1 tablet by mouth 2 (two) times daily.    [provider]  sennosides-docusate sodium  (SENOKOT-S) 8.6-50 MG tablet Take 1 tablet by mouth daily. Patient taking differently: Take 1 tablet by mouth 2 (two) times daily.  05/28/16   Kwiatkowski, Peter F, MD  sodium chloride  (OCEAN) 0.65 % SOLN nasal spray Place 2 sprays into both nostrils 4 (four) times daily.    [provider]  Sodium Fluoride  (PREVIDENT 5000 BOOSTER PLUS) 1.1 % PSTE Place 1 application onto teeth See admin instructions. Brush/floss normally, then use a smear of prevident on your toothbrush twice daily; do not eat/drink/rinse/ or use mouthwash after use    [provider]  traMADol  (ULTRAM ) 50 MG tablet Take 1 tablet (50 mg total) by mouth See admin instructions. Take 50 mg by mouth three times a day at 8:00 am, 14:00 and 22:00 Also take 50 mg by mouth every four  hours as needed for sever pain not to exceed total 4/day Patient taking differently: Take 50 mg by mouth 4 (four) times daily.  10/05/16   Krishnan, Gokul, MD  traMADol  (ULTRAM ) 50 MG tablet Take 50 mg by mouth 4 (four) times daily.  07/26/19   [provider]  traZODone  (DESYREL ) 150 MG tablet Take 1 tablet (150 mg total) by mouth at bedtime. 10/05/16   Krishnan, Gokul, MD  traZODone  (DESYREL ) 150 MG tablet Take 150 mg by mouth at bedtime. 07/29/19   [provider]  triamcinolone  cream (KENALOG ) 0.1 % Apply 1 application topically 2 (two) times daily as needed for rash. Apply to rash on left forearm until cleared. 08/04/19   [provider]  XARELTO  15 MG TABS tablet Take 15 mg by mouth daily. 07/29/19   [provider]    Allergies: Codeine, Morphine  and codeine, Valium [diazepam], Sulfa antibiotics, Codeine, Levsin [hyoscyamine], Morphine  and codeine, Penicillins, Sulfa antibiotics, Valium [diazepam], Zantac [ranitidine hcl], Darvon [propoxyphene], Floxin [ofloxacin], Levsin [hyoscyamine sulfate], Penicillins, and Zantac [ranitidine hcl]    Review of Systems  Gastrointestinal:  Positive for abdominal pain.  All other systems reviewed and are negative.   Updated Vital Signs LMP 10/10/1969   SpO2 100%   Physical Exam Vitals and nursing note reviewed.  Constitutional:      Comments: Slightly uncomfortable  HENT:     Head:     Comments: Small posterior scalp hematoma    Nose: Nose normal.     Mouth/Throat:     Mouth: Mucous membranes are moist.  Eyes:     Extraocular Movements: Extraocular movements intact.     Pupils: Pupils are equal, round, and reactive to light.  Neck:     Comments: C-collar in place Cardiovascular:     Rate and Rhythm: Normal rate and regular rhythm.     Pulses: Normal pulses.     Heart sounds: Normal heart sounds.  Pulmonary:     Effort: Pulmonary effort is normal.     Breath sounds: Normal breath sounds.  Abdominal:      Comments: Mild lower abdominal tenderness.  No obvious bruising or ecchymosis of the abdomen  Musculoskeletal:     Comments: No obvious spinal tenderness or step-off.  Patient has normal range  of motion bilateral hips.  Patient has 1+ edema which is chronic  Skin:    General: Skin is warm.     Capillary Refill: Capillary refill takes less than 2 seconds.  Neurological:     Comments: ANO x 2 and moving all extremities.  Difficulty following commands  Psychiatric:        Mood and Affect: Mood normal.        Behavior: Behavior normal.     (all labs ordered are listed, but only abnormal results are displayed) Labs Reviewed  COMPREHENSIVE METABOLIC PANEL WITH GFR  CBC  ETHANOL  URINALYSIS, ROUTINE W REFLEX MICROSCOPIC  PROTIME-INR  BRAIN NATRIURETIC PEPTIDE  I-STAT CHEM 8, ED  TROPONIN I (HIGH SENSITIVITY)    EKG: None  Radiology: No results found.   Procedures   EMERGENCY DEPARTMENT US  FAST EXAM Limited Ultrasound of the Abdomen and Pericardium (FAST Exam).   INDICATIONS:Abnornal vitals Multiple views of the abdomen and pericardium are obtained with a multi-frequency probe.  PERFORMED BY: Myself IMAGES ARCHIVED?: No LIMITATIONS:  Body habitus INTERPRETATION:  No abdominal free fluid   Medications Ordered in the ED  fentaNYL  (SUBLIMAZE ) injection 50 mcg (has no administration in time range)                                    Medical Decision Making Tracy Rodriguez is a 89 y.o. female here presenting with fall.  Patient is a level 2 trauma since she is on Eliquis .  Patient also has some lower abdominal pain as well.  Patient does not have any signs of abdominal injury but was hypotensive prior to arrival.  Patient's bedside FAST exam was negative.  Given hypotension and lower abdominal pain we will do trauma scan with CT head and cervical spine and CT chest abdomen pelvis.  Will also do In-N-Out to get urinalysis to rule out UTI  6:50 PM I reviewed patient's labs  and creatinine is 1.5 which is similar to previous.  Troponin was 33 initially and went down to 27.  Trauma scan was unremarkable.  Patient does have a UTI on urinalysis.  Patient was given Rocephin  in the ED.  Patient will be discharged back to facility on Keflex .   Problems Addressed: Acute cystitis without hematuria: acute illness or injury Fall, initial encounter: acute illness or injury  Amount and/or Complexity of Data Reviewed Labs: ordered. Decision-making details documented in ED Course. Radiology: ordered and independent interpretation performed. Decision-making details documented in ED Course. ECG/medicine tests: ordered.  Risk Prescription drug management.     Final diagnoses:  None    ED Discharge Orders     None          Patt Alm Macho, MD 12/28/23 1850

## 2023-12-28 NOTE — ED Notes (Addendum)
 Pt voided in the bed. Pt cleaned and bed linen changed. Pt provided with some apple sauce for a snack.

## 2023-12-28 NOTE — ED Triage Notes (Signed)
 Pt was brought to ED by EMS from [facility] due to a ground level fall. Pt was trying to stand up and fell backwards causing the chair to move and her to fall on the floor hitting her head. EMS reports neck, back and right arm pain.  Pt is on xarelto . EMS reports A&O x2 but this is her baseline aware of event and time. EMS states pt was slightly hypotensive in route with a BP of 92/58, Attempted to start IV but was unsuccessful. CBG 103 P 70. O2 100 on RA.

## 2023-12-28 NOTE — Discharge Instructions (Addendum)
 We have done CT scans of your head and cervical spine and chest and abdomen and they were unremarkable  You do have a urinary tract infection  I have prescribed Keflex  500 mg 3 times daily for 5 days for UTI  You need to follow-up with your doctor  Fall precautions at the facility  Return to ER if you have another fall, passing out, fever or vomiting

## 2023-12-28 NOTE — ED Notes (Signed)
 Care turned over to [facility]. DNR included in paperwork given to transport.

## 2024-01-01 LAB — URINE CULTURE: Culture: >=100000 — AB

## 2024-01-02 ENCOUNTER — Telehealth (HOSPITAL_BASED_OUTPATIENT_CLINIC_OR_DEPARTMENT_OTHER): Payer: Self-pay

## 2024-01-02 NOTE — Telephone Encounter (Signed)
 Post ED Visit - Positive Culture Follow-up  Culture report reviewed by antimicrobial stewardship pharmacist: [facility] []  Rankin Dee, Pharm.D. []  Venetia Gully, Pharm.D., BCPS AQ-ID []  Garrel Crews, Pharm.D., BCPS []  Almarie Lunger, Pharm.D., BCPS []  [place], [address].D., BCPS, AAHIVP []  Rosaline Bihari, Pharm.D., BCPS, AAHIVP [x]  Vernell Meier, PharmD, BCPS []  Latanya Hint, PharmD, BCPS []  Donald Medley, PharmD, BCPS []  Rocky Bold, PharmD []  Dorothyann Alert, PharmD, BCPS []  Morene Babe, PharmD  [facility] []  Rosaline Edison, PharmD []  Romona Bliss, PharmD []  Dolphus Roller, PharmD []  Veva Seip, Rph []  Vernell Daunt) Leonce, PharmD []  Eva Allis, PharmD []  Rosaline Millet, PharmD []  Iantha Batch, PharmD []  Arvin Gauss, PharmD []  Wanda Hasting, PharmD []  Ronal Rav, PharmD []  Rocky Slade, PharmD []  Bard Jeans, PharmD   Positive urine culture Treated with Cephalexin , organism sensitive to the same and no further patient follow-up is required at this time.  Tracy Rodriguez 01/02/2024, 9:54 AM

## 2024-02-24 ENCOUNTER — Emergency Department (HOSPITAL_COMMUNITY)

## 2024-02-24 ENCOUNTER — Encounter (HOSPITAL_COMMUNITY): Payer: Self-pay

## 2024-02-24 ENCOUNTER — Observation Stay (HOSPITAL_COMMUNITY)
Admission: EM | Admit: 2024-02-24 | Discharge: 2024-02-25 | Disposition: A | Discharge status: Skilled Nursing Facility | Attending: Internal Medicine | Admitting: Internal Medicine

## 2024-02-24 ENCOUNTER — Observation Stay (HOSPITAL_COMMUNITY)

## 2024-02-24 DIAGNOSIS — R55 Syncope and collapse: Principal | ICD-10-CM | POA: Diagnosis present

## 2024-02-24 DIAGNOSIS — I700 Atherosclerosis of aorta: Secondary | ICD-10-CM | POA: Diagnosis not present

## 2024-02-24 DIAGNOSIS — M6281 Muscle weakness (generalized): Secondary | ICD-10-CM | POA: Diagnosis not present

## 2024-02-24 DIAGNOSIS — F319 Bipolar disorder, unspecified: Secondary | ICD-10-CM | POA: Diagnosis not present

## 2024-02-24 DIAGNOSIS — I10 Essential (primary) hypertension: Secondary | ICD-10-CM | POA: Diagnosis present

## 2024-02-24 DIAGNOSIS — I130 Hypertensive heart and chronic kidney disease with heart failure and stage 1 through stage 4 chronic kidney disease, or unspecified chronic kidney disease: Secondary | ICD-10-CM | POA: Diagnosis not present

## 2024-02-24 DIAGNOSIS — R5381 Other malaise: Secondary | ICD-10-CM | POA: Diagnosis present

## 2024-02-24 DIAGNOSIS — I5032 Chronic diastolic (congestive) heart failure: Secondary | ICD-10-CM | POA: Diagnosis not present

## 2024-02-24 DIAGNOSIS — Z7901 Long term (current) use of anticoagulants: Secondary | ICD-10-CM | POA: Diagnosis not present

## 2024-02-24 DIAGNOSIS — Z7189 Other specified counseling: Secondary | ICD-10-CM | POA: Diagnosis not present

## 2024-02-24 DIAGNOSIS — N179 Acute kidney failure, unspecified: Secondary | ICD-10-CM | POA: Diagnosis present

## 2024-02-24 DIAGNOSIS — E785 Hyperlipidemia, unspecified: Secondary | ICD-10-CM | POA: Diagnosis not present

## 2024-02-24 DIAGNOSIS — F03B0 Unspecified dementia, moderate, without behavioral disturbance, psychotic disturbance, mood disturbance, and anxiety: Secondary | ICD-10-CM | POA: Diagnosis not present

## 2024-02-24 DIAGNOSIS — Z79899 Other long term (current) drug therapy: Secondary | ICD-10-CM

## 2024-02-24 DIAGNOSIS — F0390 Unspecified dementia without behavioral disturbance: Secondary | ICD-10-CM | POA: Diagnosis present

## 2024-02-24 DIAGNOSIS — D649 Anemia, unspecified: Secondary | ICD-10-CM | POA: Diagnosis not present

## 2024-02-24 DIAGNOSIS — N1832 Chronic kidney disease, stage 3b: Secondary | ICD-10-CM | POA: Diagnosis not present

## 2024-02-24 DIAGNOSIS — Z515 Encounter for palliative care: Secondary | ICD-10-CM | POA: Diagnosis not present

## 2024-02-24 DIAGNOSIS — E876 Hypokalemia: Secondary | ICD-10-CM | POA: Diagnosis not present

## 2024-02-24 DIAGNOSIS — I509 Heart failure, unspecified: Secondary | ICD-10-CM

## 2024-02-24 DIAGNOSIS — I5031 Acute diastolic (congestive) heart failure: Secondary | ICD-10-CM | POA: Diagnosis not present

## 2024-02-24 DIAGNOSIS — R001 Bradycardia, unspecified: Secondary | ICD-10-CM

## 2024-02-24 DIAGNOSIS — W19XXXA Unspecified fall, initial encounter: Secondary | ICD-10-CM | POA: Diagnosis present

## 2024-02-24 DIAGNOSIS — R2681 Unsteadiness on feet: Secondary | ICD-10-CM | POA: Diagnosis not present

## 2024-02-24 DIAGNOSIS — I480 Paroxysmal atrial fibrillation: Secondary | ICD-10-CM | POA: Diagnosis present

## 2024-02-24 DIAGNOSIS — I4891 Unspecified atrial fibrillation: Secondary | ICD-10-CM | POA: Diagnosis not present

## 2024-02-24 LAB — CBC WITH DIFFERENTIAL/PLATELET
Abs Immature Granulocytes: 0.06 K/uL (ref 0.00–0.07)
Basophils Absolute: 0 K/uL (ref 0.0–0.1)
Basophils Relative: 0 %
Eosinophils Absolute: 0.2 K/uL (ref 0.0–0.5)
Eosinophils Relative: 2 %
HCT: 34.1 % — ABNORMAL LOW (ref 36.0–46.0)
Hemoglobin: 10.7 g/dL — ABNORMAL LOW (ref 12.0–15.0)
Immature Granulocytes: 1 %
Lymphocytes Relative: 15 %
Lymphs Abs: 1.3 K/uL (ref 0.7–4.0)
MCH: 27.2 pg (ref 26.0–34.0)
MCHC: 31.4 g/dL (ref 30.0–36.0)
MCV: 86.8 fL (ref 80.0–100.0)
Monocytes Absolute: 0.8 K/uL (ref 0.1–1.0)
Monocytes Relative: 9 %
Neutro Abs: 6.2 K/uL (ref 1.7–7.7)
Neutrophils Relative %: 73 %
Platelets: 275 K/uL (ref 150–400)
RBC: 3.93 MIL/uL (ref 3.87–5.11)
RDW: 17 % — ABNORMAL HIGH (ref 11.5–15.5)
WBC: 8.5 K/uL (ref 4.0–10.5)
nRBC: 0 % (ref 0.0–0.2)

## 2024-02-24 LAB — ECHOCARDIOGRAM COMPLETE
AR max vel: 2.11 cm2
AV Area VTI: 2.2 cm2
AV Area mean vel: 2.02 cm2
AV Mean grad: 3 mmHg
AV Peak grad: 5.7 mmHg
Ao pk vel: 1.19 m/s
Area-P 1/2: 5.06 cm2
Height: 66 in
S' Lateral: 2.9 cm
Weight: 2720 [oz_av]

## 2024-02-24 LAB — CBG MONITORING, ED: Glucose-Capillary: 104 mg/dL — ABNORMAL HIGH (ref 70–99)

## 2024-02-24 LAB — COMPREHENSIVE METABOLIC PANEL WITH GFR
ALT: 12 U/L (ref 0–44)
AST: 22 U/L (ref 15–41)
Albumin: 3.2 g/dL — ABNORMAL LOW (ref 3.5–5.0)
Alkaline Phosphatase: 57 U/L (ref 38–126)
Anion gap: 10 (ref 5–15)
BUN: 30 mg/dL — ABNORMAL HIGH (ref 8–23)
CO2: 31 mmol/L (ref 22–32)
Calcium: 8.7 mg/dL — ABNORMAL LOW (ref 8.9–10.3)
Chloride: 96 mmol/L — ABNORMAL LOW (ref 98–111)
Creatinine, Ser: 1.65 mg/dL — ABNORMAL HIGH (ref 0.44–1.00)
GFR, Estimated: 29 mL/min — ABNORMAL LOW (ref >60)
Glucose, Bld: 126 mg/dL — ABNORMAL HIGH (ref 70–99)
Potassium: 3.5 mmol/L (ref 3.5–5.1)
Sodium: 137 mmol/L (ref 135–145)
Total Bilirubin: 0.7 mg/dL (ref 0.0–1.2)
Total Protein: 6.9 g/dL (ref 6.5–8.1)

## 2024-02-24 LAB — TROPONIN I (HIGH SENSITIVITY)
Troponin I (High Sensitivity): 22 ng/L — ABNORMAL HIGH (ref <18)
Troponin I (High Sensitivity): 18 ng/L — ABNORMAL HIGH (ref <18)

## 2024-02-24 LAB — URINALYSIS, W/ REFLEX TO CULTURE (INFECTION SUSPECTED)
Bilirubin Urine: NEGATIVE
Glucose, UA: NEGATIVE mg/dL
Ketones, ur: NEGATIVE mg/dL
Nitrite: NEGATIVE
Protein, ur: NEGATIVE mg/dL
Specific Gravity, Urine: 1.004 — ABNORMAL LOW (ref 1.005–1.030)
pH: 6 (ref 5.0–8.0)

## 2024-02-24 LAB — PHOSPHORUS: Phosphorus: 3.6 mg/dL (ref 2.5–4.6)

## 2024-02-24 LAB — MAGNESIUM: Magnesium: 2 mg/dL (ref 1.7–2.4)

## 2024-02-24 LAB — BRAIN NATRIURETIC PEPTIDE: B Natriuretic Peptide: 314.6 pg/mL — ABNORMAL HIGH (ref 0.0–100.0)

## 2024-02-24 MED ORDER — HYDROMORPHONE HCL 1 MG/ML IJ SOLN: 0.5000 mg | INTRAMUSCULAR | Status: DC | PRN

## 2024-02-24 MED ORDER — SODIUM CHLORIDE 0.9 % IV SOLN
2.0000 g | Freq: Every day | INTRAVENOUS | Status: DC
  Administered 2024-02-24: 08:00:00 2 g via INTRAVENOUS
  Filled 2024-02-24: qty 20

## 2024-02-24 MED ORDER — CARVEDILOL 3.125 MG PO TABS
6.2500 mg | ORAL_TABLET | Freq: Two times a day (BID) | ORAL | Status: DC
  Administered 2024-02-24 – 2024-02-25 (×3): 6.25 mg via ORAL
  Filled 2024-02-24 (×3): qty 2

## 2024-02-24 MED ORDER — CARBOXYMETHYLCELLULOSE SOD PF 0.5 % OP SOLN: 1.0000 [drp] | Freq: Four times a day (QID) | OPHTHALMIC | Status: DC

## 2024-02-24 MED ORDER — MAGNESIUM SULFATE IN D5W 1-5 GM/100ML-% IV SOLN
1.0000 g | Freq: Once | INTRAVENOUS | Status: AC
  Administered 2024-02-24: 09:00:00 1 g via INTRAVENOUS
  Filled 2024-02-24: qty 100

## 2024-02-24 MED ORDER — SENNOSIDES-DOCUSATE SODIUM 8.6-50 MG PO TABS
1.0000 | ORAL_TABLET | Freq: Two times a day (BID) | ORAL | Status: DC
  Administered 2024-02-24 – 2024-02-25 (×3): 1 via ORAL
  Filled 2024-02-24 (×3): qty 1

## 2024-02-24 MED ORDER — ONDANSETRON HCL 4 MG PO TABS: 4.0000 mg | ORAL_TABLET | Freq: Four times a day (QID) | ORAL | Status: DC | PRN

## 2024-02-24 MED ORDER — IPRATROPIUM BROMIDE 0.02 % IN SOLN: 0.5000 mg | Freq: Four times a day (QID) | RESPIRATORY_TRACT | Status: DC | PRN

## 2024-02-24 MED ORDER — VITAMIN D 25 MCG (1000 UNIT) PO TABS
2000.0000 [IU] | ORAL_TABLET | Freq: Every day | ORAL | Status: DC
  Administered 2024-02-24 – 2024-02-25 (×2): 2000 [IU] via ORAL
  Filled 2024-02-24 (×2): qty 2

## 2024-02-24 MED ORDER — TRAZODONE HCL 50 MG PO TABS
100.0000 mg | ORAL_TABLET | Freq: Every day | ORAL | Status: DC
  Administered 2024-02-24: 22:00:00 100 mg via ORAL
  Filled 2024-02-24: qty 2

## 2024-02-24 MED ORDER — SENNOSIDES-DOCUSATE SODIUM 8.6-50 MG PO TABS: 1.0000 | ORAL_TABLET | Freq: Every evening | ORAL | Status: DC | PRN

## 2024-02-24 MED ORDER — SODIUM CHLORIDE 0.9% FLUSH
3.0000 mL | Freq: Two times a day (BID) | INTRAVENOUS | Status: DC
  Administered 2024-02-24: 22:00:00 3 mL via INTRAVENOUS

## 2024-02-24 MED ORDER — ACETAMINOPHEN 325 MG PO TABS: 650.0000 mg | ORAL_TABLET | Freq: Four times a day (QID) | ORAL | Status: DC | PRN

## 2024-02-24 MED ORDER — LOPERAMIDE HCL 2 MG PO CAPS: 2.0000 mg | ORAL_CAPSULE | ORAL | Status: DC | PRN

## 2024-02-24 MED ORDER — ATORVASTATIN CALCIUM 40 MG PO TABS
40.0000 mg | ORAL_TABLET | Freq: Every day | ORAL | Status: DC
  Administered 2024-02-24: 22:00:00 40 mg via ORAL
  Filled 2024-02-24: qty 1

## 2024-02-24 MED ORDER — ONDANSETRON HCL 4 MG/2ML IJ SOLN: 4.0000 mg | Freq: Four times a day (QID) | INTRAMUSCULAR | Status: DC | PRN

## 2024-02-24 MED ORDER — DILTIAZEM HCL ER BEADS 120 MG PO CP24: 120.0000 mg | ORAL_CAPSULE | Freq: Every day | ORAL | Status: DC

## 2024-02-24 MED ORDER — OXYCODONE HCL 5 MG PO TABS
5.0000 mg | ORAL_TABLET | ORAL | Status: DC | PRN
  Administered 2024-02-24 – 2024-02-25 (×2): 5 mg via ORAL
  Filled 2024-02-24 (×2): qty 1

## 2024-02-24 MED ORDER — ALUM & MAG HYDROXIDE-SIMETH 200-200-20 MG/5ML PO SUSP: 30.0000 mL | Freq: Every day | ORAL | Status: DC | PRN

## 2024-02-24 MED ORDER — ESCITALOPRAM OXALATE 10 MG PO TABS
10.0000 mg | ORAL_TABLET | Freq: Every day | ORAL | Status: DC
  Administered 2024-02-24 – 2024-02-25 (×2): 10 mg via ORAL
  Filled 2024-02-24 (×2): qty 1

## 2024-02-24 MED ORDER — HEPARIN SODIUM (PORCINE) 5000 UNIT/ML IJ SOLN: 5000.0000 [IU] | Freq: Three times a day (TID) | INTRAMUSCULAR | Status: DC

## 2024-02-24 MED ORDER — DILTIAZEM HCL ER COATED BEADS 120 MG PO CP24
120.0000 mg | ORAL_CAPSULE | Freq: Every day | ORAL | Status: DC
  Administered 2024-02-24 – 2024-02-25 (×2): 120 mg via ORAL
  Filled 2024-02-24 (×3): qty 1

## 2024-02-24 MED ORDER — MELATONIN 5 MG PO TABS: 5.0000 mg | ORAL_TABLET | Freq: Every evening | ORAL | Status: DC | PRN

## 2024-02-24 MED ORDER — SODIUM CHLORIDE 0.9 % IV SOLN: INTRAVENOUS | Status: DC

## 2024-02-24 MED ORDER — HYDRALAZINE HCL 20 MG/ML IJ SOLN: 10.0000 mg | INTRAMUSCULAR | Status: DC | PRN

## 2024-02-24 MED ORDER — RIVAROXABAN 15 MG PO TABS
15.0000 mg | ORAL_TABLET | Freq: Every day | ORAL | Status: DC
  Administered 2024-02-24: 17:00:00 15 mg via ORAL
  Filled 2024-02-24: qty 1

## 2024-02-24 MED ORDER — BISACODYL 5 MG PO TBEC: 5.0000 mg | DELAYED_RELEASE_TABLET | Freq: Every day | ORAL | Status: DC | PRN

## 2024-02-24 MED ORDER — FUROSEMIDE 10 MG/ML IJ SOLN
40.0000 mg | Freq: Once | INTRAMUSCULAR | Status: AC
  Administered 2024-02-24: 02:00:00 40 mg via INTRAVENOUS
  Filled 2024-02-24: qty 4

## 2024-02-24 MED ORDER — ACETAMINOPHEN 650 MG RE SUPP: 650.0000 mg | Freq: Four times a day (QID) | RECTAL | Status: DC | PRN

## 2024-02-24 NOTE — Assessment & Plan Note (Signed)
 Mood stable -Reviewing home medication, anticipating continuing Lexapro , Neurontin ,

## 2024-02-24 NOTE — ED Triage Notes (Signed)
 BIB [place] [facility] from [facility]. Per EMS staff called due to finding patient on the bathroom floor approx.1 hour pta. States while pt was with staff she had a syncopal episode. Pt states she remembers falling but unknown if she hit head. Unknown LKN.   EMS VS 126/80 HR 30-50 100% RA 124 BGL

## 2024-02-24 NOTE — Assessment & Plan Note (Signed)
 Continue statin -Although the risk versus benefit of statin needs to be evaluated at this age and comorbidities

## 2024-02-24 NOTE — ED Notes (Signed)
 Daughter Eleanna Theilen [phone] would like an update asap

## 2024-02-24 NOTE — Consult Note (Addendum)
 Cardiology Consultation   Patient ID: Tracy Rodriguez MRN: [id number]; DOB: 03/21/1931  Admit date: 02/24/2024 Date of Consult: 02/24/2024  PCP:  Freddrick Johns   [facility] Providers Cardiologist:  Gordy Bergamo, MD      Patient Profile: Tracy Rodriguez is a 89 y.o. female with a hx of dementia, atrial fibrillation, orthostatic hypotension, hypertension, CKD stage IIIb, and HFpEF who is being seen 02/24/2024 for the evaluation of syncope and bradycardia at the request of Evalene Sprinkles MD.  History of Present Illness: Tracy Rodriguez is a 89 year old female who was seen by Dr. Alveta in 2016 for chest pain and atrial fibrillation.  At that time she was on Eliquis  for her atrial fibrillation.  Her troponins were negative and an echocardiogram showed a normal LVEF of 55 to 60% with no regional wall motion abnormalities.  No further ischemic evaluation was felt to be needed at that time.  The patient's most recent echo prior to this hospitalization was done on 09/2016 and showed a hyperdynamic LVEF of 65 to 70%, moderate concentric LVH, G1 DD, aortic valve calcification, mean pressure across aortic valve of 9 mmHg, and mildly dilated ascending aorta with a diameter of 39 mm.  Patient presented to the hospital following a syncopal episode and an unwitnessed fall.  On interview patient appeared to be slightly confused and thought that the year was 2023.  She knew her name, date of birth, and where she was at.  The patient does have baseline dementia. the patient reported that she tripped on her leg and that is what caused her to fall.  Denied any melena, hematuria, hematochezia, orthopnea, chest pain, syncope or loss of consciousness.  Patient was also slightly confused about which medication she takes.  She lives at an assisted living's facility and they manage her medications.  Labs showed UA that was positive for hemoglobin, leukocytes, and bacteria, potassium of 3.5, creatinine of 1.65, BUN of 30,  albumin of 3.2, elevated BNP of 314.6, WBC count of 8.5, and anemia with a hemoglobin of 10.7  Head CT and C-spine CT showed no acute abnormalities.  Chest x-ray showed bibasilar patchy opacities favoring atelectasis or scarring  EKG showed atrial fibrillation with a rate of 63, QT was read as prolonged with a QTcB of 481 when I measured it appears to be less than this.  Orthostatic vital signs as shown below   Past Medical History:  Diagnosis Date   Arthritis    joints (08/04/2014)   Bipolar 1 disorder (HCC)    Cancer of right breast (HCC)    CHF (congestive heart failure) (HCC)    Chronic lower back pain    Hyperlipemia    Hypertension    PAF (paroxysmal atrial fibrillation) (HCC)     Past Surgical History:  Procedure Laterality Date   AUGMENTATION MAMMAPLASTY Bilateral    BACK SURGERY     BREAST BIOPSY Right    BREAST RECONSTRUCTION Bilateral    CATARACT EXTRACTION W/ INTRAOCULAR LENS  IMPLANT, BILATERAL Bilateral    EXCISIONAL HEMORRHOIDECTOMY     LUMBAR DISC SURGERY  1972; 1973; 1985   ruptured discs each time   MASTECTOMY Right    cancer   PLACEMENT OF BREAST IMPLANTS Bilateral    had to take tissue out of left   TONSILLECTOMY     VAGINAL HYSTERECTOMY       Home Medications:  Prior to Admission medications  Medication Sig Start Date End Date Taking? Authorizing Provider  acetaminophen  (TYLENOL ) 500 MG  tablet Take 500 mg by mouth every 6 (six) hours as needed (pain).   Yes [provider]  alum & mag hydroxide-simeth (MAALOX PLUS) 400-400-40 MG/5ML suspension Take 30 mLs by mouth 3 (three) times daily as needed for indigestion (heartburn).   Yes [provider]  atorvastatin  (LIPITOR) 10 MG tablet Take 10 mg by mouth every evening.   Yes [provider]  carvedilol  (COREG ) 6.25 MG tablet Take 6.25 mg by mouth 2 (two) times daily.   Yes [provider]  digoxin  (LANOXIN ) 0.125 MG tablet Take 0.125 mg by mouth every other  day. HOLD for pulse < 60   Yes [provider]  diltiazem  (TIAZAC ) 120 MG 24 hr capsule Take 120 mg by mouth daily.   Yes [provider]  ergocalciferol  (VITAMIN D2) 1.25 MG (50000 UT) capsule Take 50,000 Units by mouth every Friday.   Yes [provider]  escitalopram  (LEXAPRO ) 5 MG tablet Take 5 mg by mouth daily.   Yes [provider]  gabapentin  (NEURONTIN ) 100 MG capsule Take 100 mg by mouth 3 (three) times daily.    Yes [provider]  guaiFENesin  (GERI-TUSSIN) 100 MG/5ML liquid Take 200 mLs by mouth every 6 (six) hours as needed for cough.   Yes [provider]  loratadine (CLARITIN) 10 MG tablet Take 10 mg by mouth daily.   Yes [provider]  Melatonin 3 MG TABS Take 3 mg by mouth at bedtime.   Yes [provider]  mineral oil-hydrophilic petrolatum  (AQUAPHOR) ointment Apply 1 Application topically every Sunday. Apply 2g topically to bilateral lower legs every Wednesday.   Yes [provider]  Pramox-PE-Glycerin-Petrolatum  (PREPARATION H) 1-0.25-14.4-15 % CREA Place 1 application  rectally daily as needed (hemorrhoid flare-ups).   Yes [provider]  Rivaroxaban  (XARELTO ) 15 MG TABS tablet Take 1 tablet (15 mg total) by mouth daily. HOLD FOR ONE WEEK AND FOLLOW UP WITH pcp before resuming it. Patient taking differently: Take 15 mg by mouth at bedtime. 06/18/19  Yes Cherlyn Labella, MD  senna-docusate (SENOKOT-S) 8.6-50 MG tablet Take 1 tablet by mouth 2 (two) times daily.   Yes [provider]  torsemide (DEMADEX) 20 MG tablet Take 80 mg by mouth daily.   Yes [provider]  traMADol  (ULTRAM ) 50 MG tablet Take 50 mg by mouth 3 (three) times daily. 07/26/19  Yes [provider]  traZODone  (DESYREL ) 150 MG tablet Take 1 tablet (150 mg total) by mouth at bedtime. 10/05/16  Yes Krishnan, Gokul, MD    Scheduled Meds:  atorvastatin   40 mg Oral QHS   Carboxymethylcellulose Sod PF   1 drop Both Eyes QID   escitalopram   10 mg Oral Daily   Rivaroxaban   15 mg Oral Q supper   senna-docusate  1 tablet Oral BID   sodium chloride  flush  3 mL Intravenous Q12H   sodium chloride  flush  3 mL Intravenous Q12H   traZODone   100 mg Oral QHS   Vitamin D   2,000 Units Oral Daily   Continuous Infusions:  cefTRIAXone  (ROCEPHIN )  IV Stopped (02/24/24 0837)   PRN Meds: acetaminophen  **OR** acetaminophen , alum & mag hydroxide-simeth, bisacodyl , hydrALAZINE , HYDROmorphone  (DILAUDID ) injection, ipratropium, loperamide , melatonin, ondansetron  **OR** ondansetron  (ZOFRAN ) IV, oxyCODONE   Allergies:   Allergies[1]  Social History:   Social History   Socioeconomic History   Marital status: Widowed    Spouse name: Not on file   Number of children: Not on file   Years of education: Not  on file   Highest education level: Not on file  Occupational History   Occupation: retired  Tobacco Use   Smoking status: Never   Smokeless tobacco: Never  Vaping Use   Vaping status: Never Used  Substance and Sexual Activity   Alcohol  use: No   Drug use: No   Sexual activity: Not Currently  Other Topics Concern   Not on file  Social History Narrative   ** Merged History Encounter **       Social Drivers of Health   Tobacco Use: Low Risk (02/24/2024)   Patient History    Smoking Tobacco Use: Never    Smokeless Tobacco Use: Never    Passive Exposure: Not on file  Financial Resource Strain: Not on file  Food Insecurity: Not on file  Transportation Needs: Not on file  Physical Activity: Not on file  Stress: Not on file  Social Connections: Not on file  Intimate Partner Violence: Not on file  Depression (EYV7-0): Not on file  Alcohol  Screen: Not on file  Housing: Not on file  Utilities: Not on file  Health Literacy: Not on file    Family History:    Family History  Problem Relation Age of Onset   Stroke Mother    Heart disease Father    Alzheimer's disease Sister    Leukemia  Brother    Arthritis Maternal Grandmother    Breast cancer Daughter      ROS:  Please see the history of present illness.   All other ROS reviewed and negative.     Physical Exam/Data: Vitals:   02/24/24 0400 02/24/24 0530 02/24/24 0802 02/24/24 0900  BP: (!) 142/66 (!) 120/52 (!) 140/84 (!) 154/87  Pulse: 82 (!) 45 65 74  Resp: 19 17  19   Temp:  97.6 F (36.4 C) (!) 97.5 F (36.4 C)   TempSrc:  Oral Oral   SpO2: 96% 93% 96% 94%  Weight:      Height:       No intake or output data in the 24 hours ending 02/24/24 1031    02/24/2024   12:49 AM 12/28/2023    3:45 PM 08/24/2019    2:59 PM  Last 3 Weights  Weight (lbs) 170 lb 156 lb 180 lb  Weight (kg) 77.111 kg 70.761 kg 81.647 kg     Body mass index is 27.44 kg/m.  General:  Well nourished, well developed, in no acute distress.  Alert and orientated 3 out of 4 on room air. HEENT: normal Neck: no JVD Vascular: No carotid bruits; Distal pulses 2+ bilaterally Cardiac:  normal S1, S2; irregularly irregular rhythm Lungs:  clear to auscultation bilaterally, no wheezing, rhonchi or rales  Abd: soft, nontender, no hepatomegaly  Ext: no edema Musculoskeletal:  No deformities Skin: warm and dry  Neuro:  no focal abnormalities noted Psych:  Normal affect   EKG:  The EKG was personally reviewed and demonstrates: atrial fibrillation with a rate of 63, QT was read as prolonged with a QTcB of 481 when I measured it appears to be less than this. Telemetry:  Telemetry was personally reviewed and demonstrates: Atrial fibrillation with heart rates in the 50s to 80s. The longest time between QRS complexes was 2.3 seconds.  Relevant CV Studies: Echo pending  Laboratory Data: High Sensitivity Troponin:   Recent Labs  Lab 02/24/24 0104 02/24/24 0301  TROPONINIHS 22* 18*   No results for input(s): TRNPT in the last 720 hours.    Chemistry Recent  Labs  Lab 02/24/24 0104  NA 137  K 3.5  CL 96*  CO2 31  GLUCOSE 126*  BUN  30*  CREATININE 1.65*  CALCIUM  8.7*  MG 2.0  GFRNONAA 29*  ANIONGAP 10    Recent Labs  Lab 02/24/24 0104  PROT 6.9  ALBUMIN 3.2*  AST 22  ALT 12  ALKPHOS 57  BILITOT 0.7   Lipids No results for input(s): CHOL, TRIG, HDL, LABVLDL, LDLCALC, CHOLHDL in the last 168 hours.  Hematology Recent Labs  Lab 02/24/24 0104  WBC 8.5  RBC 3.93  HGB 10.7*  HCT 34.1*  MCV 86.8  MCH 27.2  MCHC 31.4  RDW 17.0*  PLT 275   Thyroid  No results for input(s): TSH, FREET4 in the last 168 hours.  BNP Recent Labs  Lab 02/24/24 0104  BNP 314.6*    DDimer No results for input(s): DDIMER in the last 168 hours.  Radiology/Studies:  DG Knee 2 Views Right Result Date: 02/24/2024 EXAM: 1 or 2 VIEW(S) XRAY OF THE KNEE 02/24/2024 01:25:00 AM COMPARISON: None available. CLINICAL HISTORY: fall FINDINGS: BONES AND JOINTS: No acute fracture. No malalignment. No significant joint effusion. Sclerotic lesion within the proximal tibial metaphysis, likely bone island. Mild tricompartmental degenerative changes. SOFT TISSUES: Vascular calcifications. IMPRESSION: 1. No acute osseous injury. Electronically signed by: Oneil Devonshire MD 02/24/2024 01:56 AM EST RP Workstation: [id number]   DG Pelvis Portable Result Date: 02/24/2024 EXAM: 1 or 2 VIEW(S) XRAY OF THE PELVIS 02/24/2024 01:35:00 AM COMPARISON: 12/28/2023 CLINICAL HISTORY: fall FINDINGS: BONES AND JOINTS: No acute fracture. No malalignment. Degenerative changes of the visualized lower lumbar spine. SOFT TISSUES: The soft tissues are unremarkable. IMPRESSION: 1. No evidence of acute traumatic injury. 2. Degenerative changes of the visualized lower lumbar spine. Electronically signed by: Oneil Devonshire MD 02/24/2024 01:56 AM EST RP Workstation: [id number]   DG Chest Portable 1 View Result Date: 02/24/2024 EXAM: 1 VIEW(S) XRAY OF THE CHEST 02/24/2024 01:51:00 AM COMPARISON: 12/28/2023 CLINICAL HISTORY: fall FINDINGS: LUNGS AND PLEURA: Patchy  opacities at lung bases, likely atelectasis or scarring. No pleural effusion. No pneumothorax. HEART AND MEDIASTINUM: Cardiomegaly. Aortic calcification. BONES AND SOFT TISSUES: No acute osseous abnormality. IMPRESSION: 1. Bibasilar patchy opacities, favored atelectasis or scarring. Electronically signed by: Oneil Devonshire MD 02/24/2024 01:55 AM EST RP Workstation: [id number]   DG Knee 2 Views Left Result Date: 02/24/2024 EXAM: 2 VIEW(S) XRAY OF THE LEFT KNEE 02/24/2024 01:25:00 AM COMPARISON: None available. CLINICAL HISTORY: fall FINDINGS: BONES AND JOINTS: No acute fracture. No malalignment. No significant joint effusion. Mild tricompartmental degenerative changes. SOFT TISSUES: Vascular calcifications. IMPRESSION: 1. No evidence of acute traumatic injury. 2. Mild tricompartmental degenerative changes. Electronically signed by: Oneil Devonshire MD 02/24/2024 01:54 AM EST RP Workstation: [id number]   CT Cervical Spine Wo Contrast Result Date: 02/24/2024 EXAM: CT CERVICAL SPINE WITHOUT CONTRAST 02/24/2024 01:10:22 AM TECHNIQUE: CT of the cervical spine was performed without the administration of intravenous contrast. Multiplanar reformatted images are provided for review. Automated exposure control, iterative reconstruction, and/or weight based adjustment of the mA/kV was utilized to reduce the radiation dose to as low as reasonably achievable. COMPARISON: 12/28/2023 CLINICAL HISTORY: Neck trauma (Age >= 65y) FINDINGS: BONES AND ALIGNMENT: No acute fracture or traumatic malalignment. DEGENERATIVE CHANGES: Mild degenerative changes at C6-C7. SOFT TISSUES: No prevertebral soft tissue swelling. IMPRESSION: 1. No acute findings. Electronically signed by: Pinkie Pebbles MD 02/24/2024 01:18 AM EST RP Workstation: [id number]   CT Head Wo Contrast Result Date: 02/24/2024 EXAM: CT HEAD WITHOUT 02/24/2024  01:10:22 AM TECHNIQUE: CT of the head was performed without the administration of intravenous contrast. Automated  exposure control, iterative reconstruction, and/or weight based adjustment of the mA/kV was utilized to reduce the radiation dose to as low as reasonably achievable. COMPARISON: 12/28/2023 CLINICAL HISTORY: Head trauma, minor (Age >= 65y) FINDINGS: BRAIN AND VENTRICLES: No acute intracranial hemorrhage. No mass effect or midline shift. No extra-axial fluid collection. No evidence of acute infarct. No hydrocephalus. Global cortical atrophy. Subcortical and periventricular small vessel ischemic changes. Intracranial atherosclerosis. ORBITS: No acute abnormality. SINUSES AND MASTOIDS: No acute abnormality. SOFT TISSUES AND SKULL: No acute skull fracture. No acute soft tissue abnormality. IMPRESSION: 1. No acute intracranial abnormality. Electronically signed by: Pinkie Pebbles MD 02/24/2024 01:13 AM EST RP Workstation: [id number]     Assessment and Plan: Tracy Rodriguez is a 89 y.o. female with a hx of dementia, atrial fibrillation, orthostatic hypotension, hypertension, CKD stage IIIb, and HFpEF who is being seen 02/24/2024 for the evaluation of syncope and bradycardia at the request of Evalene Sprinkles MD.  Syncope, fall, and bradycardia The patient reportedly had a witnessed syncopal episode.  When I interviewed the patient she denied having a syncopal episode.  The patient was also slightly confused. May consider ordering a 14-day ZIO monitor.   Atrial fibrillation CHA2DS2-VASc Score = 6 [CHF History: 1, HTN History: 1, Diabetes History: 0, Stroke History: 0, Vascular Disease History: 1, Age Score: 2, Gender Score: 1].  Therefore, the patient's annual risk of stroke is 9.7 %.    Suspect that A-fib is longstanding persistent or permanent given that it is present on every EKG in the last couple of years. Prior to admission was on Coreg  6.25 mg twice daily, digoxin  0.125 mg daily, and Cardizem  120 mg daily.  These medications have been held.  Prior to admission was also on Xarelto . Echo pending Continue  Xarelto  15 mg daily. Restart Coreg  6.25 mg twice daily, and Cardizem  120 mg daily. Hold digoxin    Hypertension Blood pressure is elevated most recent BP at 9 AM was 154/87.  Will be less aggressive with blood pressure management given concerns for possible syncope.   Aortic atherosclerosis Seen on CT on 12/2023.  Increases CHA2DS2-VASc score.   Risk Assessment/Risk Scores:       CHA2DS2-VASc Score = 6   This indicates a 9.7% annual risk of stroke. The patient's score is based upon: CHF History: 1 HTN History: 1 Diabetes History: 0 Stroke History: 0 Vascular Disease History: 1 Age Score: 2 Gender Score: 1       For questions or updates, please contact [facility] Please consult [email or web address] for contact info under      Signed, Morse Clause, PA-C  02/24/2024 10:31 AM     [1]  Allergies Allergen Reactions   Morphine  And Codeine Anaphylaxis and Nausea And Vomiting   Valium [Diazepam] Shortness Of Breath   Sulfa Antibiotics Itching and Rash    Sulfones per SNF paperwork   Valium [Diazepam] Other (See Comments)    Unknown reaction   Darvon [Propoxyphene] Nausea And Vomiting   Floxin [Ofloxacin] Itching   Levsin [Hyoscyamine Sulfate] Other (See Comments)    Unknown reaction   Penicillins Itching   Zantac [Ranitidine Hcl] Other (See Comments)    Unknown reaction

## 2024-02-24 NOTE — ED Notes (Signed)
 Awaiting magnesium  from main pharm

## 2024-02-24 NOTE — Evaluation (Signed)
 Occupational Therapy Evaluation Patient Details Name: Tracy Rodriguez MRN: [id number] DOB: [birth year] Today's Date: 02/24/2024   History of Present Illness   89 yo F adm 12/16 via EMS form [facility] syncopal event in bathroom PMH bipolar, afib, prior syncope, CKD CHF dyspnea, prior orthostatic syncope, bradycardia     Clinical Impressions  Tracy Rodriguez was evaluated s/p the above admission list. She reports ambulating without AD and needing assist for ADLs at baseline (unsure of accuracy). Upon evaluation the pt was limited by [facility], weakness, unsteady balance and activiyt tolerance. Overall she needed mod A for bed mobility and transfers and was unable to progress to walking on eval. Due to the deficits listed below the pt also needs max A for LB ADLs and min  for UB ADLs. Pt will benefit from continued acute OT services and skilled inpatient follow up therapy, <3 hours/day. D     If plan is discharge home, recommend the following:   A lot of help with walking and/or transfers;A lot of help with bathing/dressing/bathroom;Assistance with cooking/housework;Assist for transportation;Supervision due to cognitive status;Help with stairs or ramp for entrance     Functional Status Assessment   Patient has had a recent decline in their functional status and demonstrates the ability to make significant improvements in function in a reasonable and predictable amount of time.     Equipment Recommendations   None recommended by OT      Precautions/Restrictions   Precautions Precautions: Fall Restrictions Weight Bearing Restrictions Per Provider Order: No     Mobility Bed Mobility Overal bed mobility: Needs Assistance Bed Mobility: Sit to Supine, Supine to Sit     Supine to sit: Min assist Sit to supine: Mod assist        Transfers Overall transfer level: Needs assistance Equipment used: 1 person hand held assist Transfers: Sit to/from Stand Sit to Stand: Min assist            General transfer comment: unable to progress beyond side stepping      Balance Overall balance assessment: Needs assistance Sitting-balance support: Feet supported Sitting balance-Leahy Scale: Fair     Standing balance support: Bilateral upper extremity supported, During functional activity Standing balance-Leahy Scale: Poor                             ADL either performed or assessed with clinical judgement   ADL Overall ADL's : Needs assistance/impaired Eating/Feeding: Set up   Grooming: Set up   Upper Body Bathing: Minimal assistance   Lower Body Bathing: Maximal assistance   Upper Body Dressing : Minimal assistance   Lower Body Dressing: Maximal assistance   Toilet Transfer: Moderate assistance   Toileting- Clothing Manipulation and Hygiene: Total assistance       Functional mobility during ADLs: Moderate assistance General ADL Comments: limited by weakness and balance     Vision Baseline Vision/History: 0 No visual deficits Vision Assessment?: No apparent visual deficits     Perception Perception: Not tested       Praxis Praxis: Not tested       Pertinent Vitals/Pain Pain Assessment Pain Assessment: Faces Faces Pain Scale: Hurts little more Pain Location: abdomen? Pain Descriptors / Indicators: Discomfort Pain Intervention(s): Limited activity within patient's tolerance, Monitored during session     Extremity/Trunk Assessment Upper Extremity Assessment Upper Extremity Assessment: Generalized weakness   Lower Extremity Assessment Lower Extremity Assessment: Defer to PT evaluation   Cervical / Trunk Assessment Cervical /  Trunk Assessment: Kyphotic   Communication Communication Communication: Impaired Factors Affecting Communication: Hearing impaired   Cognition Arousal: Alert Behavior During Therapy: Flat affect Cognition: No family/caregiver present to determine baseline             OT - Cognition Comments:  followed simple 1 step commands, unsure of accuracy of history given. pt is very HOH                 Following commands: Impaired Following commands impaired: Only follows one step commands consistently     Cueing  General Comments   Cueing Techniques: Verbal cues;Gestural cues  VSS   Exercises     Shoulder Instructions      Home Living Family/patient expects to be discharged to:: Skilled nursing facility                                 Additional Comments: [facility]      Prior Functioning/Environment Prior Level of Function : Needs assist             Mobility Comments: unsure of baseline - pt states she is ambulatory without AD ADLs Comments: pt states she has assist for bathing and dressing, toilets mod I    OT Problem List: Decreased range of motion;Decreased strength;Decreased activity tolerance;Impaired balance (sitting and/or standing);Decreased safety awareness;Decreased knowledge of use of DME or AE;Decreased knowledge of precautions;Pain   OT Treatment/Interventions: Self-care/ADL training;Therapeutic exercise;DME and/or AE instruction;Therapeutic activities;Patient/family education;Balance training      OT Goals(Current goals can be found in the care plan section)   Acute Rehab OT Goals Patient Stated Goal: to get better OT Goal Formulation: With patient Time For Goal Achievement: 03/09/24 Potential to Achieve Goals: Good ADL Goals Pt Will Perform Grooming: with contact guard assist;standing Pt Will Perform Lower Body Dressing: with min assist;sit to/from stand Pt Will Transfer to Toilet: with min assist;ambulating   OT Frequency:  Min 2X/week    Co-evaluation              AM-PAC OT 6 Clicks Daily Activity     Outcome Measure Help from another person eating meals?: A Little Help from another person taking care of personal grooming?: A Little Help from another person toileting, which includes using toliet,  bedpan, or urinal?: A Lot Help from another person bathing (including washing, rinsing, drying)?: A Lot Help from another person to put on and taking off regular upper body clothing?: A Little Help from another person to put on and taking off regular lower body clothing?: A Lot 6 Click Score: 15   End of Session Equipment Utilized During Treatment: Gait belt Nurse Communication: Mobility status  Activity Tolerance: Patient tolerated treatment well Patient left: in bed;with call bell/phone within reach  OT Visit Diagnosis: Unsteadiness on feet (R26.81);Other abnormalities of gait and mobility (R26.89);Muscle weakness (generalized) (M62.81);History of falling (Z91.81);Pain                Time: 8473-8455 OT Time Calculation (min): 18 min Charges:  OT General Charges $OT Visit: 1 Visit OT Evaluation $OT Eval Moderate Complexity: 1 Mod  Tracy Rodriguez, OTR/L Acute Rehabilitation Services Office [phone] Secure Chat Communication Preferred   Tracy Rodriguez 02/24/2024, 5:03 PM

## 2024-02-24 NOTE — Progress Notes (Signed)
°   02/24/24 1815  TOC Brief Assessment  Insurance and Status Reviewed  Patient has primary care physician Yes  Home environment has been reviewed From [facility]  Prior level of function: Assisted  Social Drivers of Health Review SDOH reviewed no interventions necessary  Readmission risk has been reviewed Yes  Transition of care needs no transition of care needs at this time   From [facility], uses a walker for ambulation.

## 2024-02-24 NOTE — Assessment & Plan Note (Signed)
-   Fall precaution, consulting PT OT for evaluation recommendation

## 2024-02-24 NOTE — Assessment & Plan Note (Signed)
 Generalized weaknesses, falls, with syncopal episodes -Fall precautions,

## 2024-02-24 NOTE — Assessment & Plan Note (Signed)
-   Bradycardic, still A-fib-symptomatic with syncope - Withholding home medication of digoxin , Cardizem , carvedilol  -Continue Xarelto  -Cardiology consulted, appreciate further evaluation recommendation

## 2024-02-24 NOTE — Assessment & Plan Note (Signed)
 Syncopal episode likely due to arrhythmia, bradycardia Close observation in ED heart rate ranges between 34-82 currently 45 -Home medication reviewed, beta-blockers, digoxin  and Cardizem  discontinued -Continue to monitor heart monitor closely -EDP consulted cardiology, appreciate further evaluation recommendations -Head imaging all reviewed with the negative -Troponin-no significant elevation,  -EKG : A-fib with rate of 63 -Continue monitoring closely, -Checking orthostatic BP  - Continue neurochecks

## 2024-02-24 NOTE — Assessment & Plan Note (Signed)
 Monitoring kidney function closely BUN/creatinine at baseline, GFR 29 Lab Results  Component Value Date   CREATININE 1.65 (H) 02/24/2024   CREATININE 1.60 (H) 12/28/2023   CREATININE 1.55 (H) 12/28/2023

## 2024-02-24 NOTE — ED Notes (Signed)
 Patient wet the entire bed including the gown. Also, pt removed her wet brief. This NT cleaned up the bed, and cleaned the patient. A new sheet, gown, and brief has been placed. Patient is sitting up in bed with blankets.

## 2024-02-24 NOTE — Assessment & Plan Note (Signed)
 Currently awake alert oriented x 1, pleasantly confused In no acute distress

## 2024-02-24 NOTE — Consult Note (Signed)
 Palliative Care Consult Note                                  Date: 02/24/2024   Patient Name: Tracy Rodriguez  DOB: [birth year]  MRN: [id number]  Age / Sex: 89 y.o., female  PCP: Pcp, No Referring Physician: Charlton Evalene RAMAN, MD  Reason for Consultation: Establishing goals of care  HPI/Patient Profile: 89 y.o. female  with past medical history of dementia, HFpEF, bipolar disorder, A-fib, prior orthostatic bradycardic syncopes, CKD stage IIIb, and HTN who presented to the ED on 02/24/2024 after a syncopal episode.  She was found down by staff in her facility.  In the ED, patient's heart rate was in the 30s.  She is admitted with bradycardia induced syncope.  Palliative Medicine has been consulted for goals of care discussions and complex medical decision making.   Clinical Assessment and Goals of Care:   Extensive chart review has been completed including labs, vital signs, imaging, progress/consult notes, orders, medications and available advance directive documents.    Patient assessed in the ED. Patient is alert and oriented to person, place, and month (but not year). She tells me she is in the hospital because she fell.   I spoke with her daughter/[name] by phone to discuss diagnosis, prognosis, GOC, EOL wishes, disposition, and options.  I introduced Palliative Medicine as specialized medical care for people living with serious illness. It focuses on providing relief from the symptoms and stress of a serious illness.   Created space and opportunity for daughter to express thoughts and feelings regarding current medical situation. Values and goals of care were attempted to be elicited.  Life Review: Patient is widowed. She has 2 daughters - [name] and [address]. [name] lives in [place],  and Pandora lives in [place]. Patient has lived at [facility] since 2018.   Functional Status: At baseline, patient is ambulatory with a walker but needs  assistance with basic ADLs other than feeding herself.   Discussion: We discussed patient's current medical situation and what it means in the larger context of her ongoing co-morbidities. Current clinical status was reviewed.   Discussed that patient is admitted with symptomatic bradycardia, but her HR is now within normal range and she has remained asymptomatic while in the ED. I reviewed with [name] that per cardiology, occasional episodes of low heart rate is not concerning in an asymptomatic person, however they recommend to discontinue digoxin  given her advanced age.  We also reviewed that dementia is a progressive, non-curable disease underlying the patient's acute medical conditions. [name] acknowledges that her mother's functional status has been gradually declining. I shared my concern that patient has lower reserve to recover from acute medical issues, in the setting of underlying dementia.   Reviewed  code status. Patient has a gold DNR form with her paperwork at bedside from her facility. [name] confirms DNR/DNI status.   We discussed scopes of care - limited interventions (treat the treatable) versus comfort care.  For now, [name] would like to continue to treat the treatable, but also speaks to wanting to keep her comfortable. Discussed plan to return to ALF when medically stable.    Outpatient palliative and hospice services were explained and offered.  [name] is agreeable to outpatient palliative referral, for ongoing support as well as assistance with transition to hospice when appropriate.  Discussed the importance of continued conversation with the medical team regarding overall  plan of care. Questions and concerns addressed.  Emotional support provided.  Review of Systems  Respiratory:  Negative for shortness of breath.   Cardiovascular:  Negative for chest pain.  Neurological:  Negative for light-headedness.    Objective:   Primary Diagnoses: Present on Admission:  Syncope   Acute renal failure superimposed on stage 3 chronic kidney disease (HCC)  Bipolar disorder (HCC)  Dementia (HCC)  Fall  Hyperlipemia  Hypertension  Physical deconditioning  Symptomatic bradycardia   Physical Exam Vitals reviewed.  Constitutional:      General: She is not in acute distress.    Comments: Frail and chronically ill-appearing  Cardiovascular:     Rate and Rhythm: Normal rate. Rhythm irregularly irregular.     Comments: A-fib Pulmonary:     Effort: Pulmonary effort is normal.  Neurological:     Mental Status: She is alert.     Comments: Oriented to person, place, and month  Psychiatric:        Cognition and Memory: Cognition is impaired. Memory is impaired.     Palliative Assessment/Data: PPS 40-50%     Assessment & Plan:   SUMMARY OF RECOMMENDATIONS   Code status changed to DNR/DNI Continue current supportive care Goal of care is medical stabilization and return to ALF Outpatient palliative referral  Primary Decision Maker: HCPOA -daughter/[name] Reihl  Prognosis:  Unable to determine  Discharge Planning:  To Be Determined    Thank you for allowing us  to participate in the care of Tracy Rodriguez   Time Total: 75 minutes  Detailed review of medical records (labs, imaging, vital signs), medically appropriate exam, discussed with treatment team, counseling and education to patient, family, & staff, documenting clinical information, coordination of care.   Signed by: Recardo Loll, NP Palliative Medicine Team  Team Phone # [phone]  For individual providers, please see AMION

## 2024-02-24 NOTE — Care Management Obs Status (Signed)
 MEDICARE OBSERVATION STATUS NOTIFICATION   Patient Details  Name: Tracy Rodriguez MRN: [id number] Date of Birth: May 13, 1931   Medicare Observation Status Notification Given:  Yes    Nena LITTIE Coffee, RN 02/24/2024, 5:59 PM

## 2024-02-24 NOTE — ED Provider Notes (Signed)
 [facility] Provider Note   CSN: [id number] Arrival date & time: 02/24/24  0041     History Chief Complaint  Patient presents with   Loss of Consciousness    HPI: Tracy Rodriguez is a 89 y.o. female with history pertinent for bipolar disorder, DNR status, elevated BMI, A-fib, prior syncope, CKD, CHF, functional dyspnea, prior orthostatic syncope, prior symptomatic bradycardia who presents complaining of falls, syncope. Patient arrived via EMS from facility ([name]).  History provided by patient and EMS.  No interpreter required during this encounter.  Patient was reportedly found down by staff at facility in her bathroom, unclear how long patient had been down for.  Reports that when staff was evaluating her, patient had an episode of syncope.  Patient reports that she is having bilateral knee pain, hip pain, and right greater than left chest wall pain.  Reports that she had a trip and fall, does not recall any chest pain or shortness of breath prior to falling out.  Denies any chest pressure, shortness of breath currently, denies headache, neck pain, back pain.  Patient's recorded medical, surgical, social, medication list and allergies were reviewed in the Snapshot window as part of the initial history.   Prior to Admission medications  Medication Sig Start Date End Date Taking? Authorizing Provider  acetaminophen  (TYLENOL ) 500 MG tablet Take 500 mg by mouth See admin instructions. Take one tablet (500 mg) by mouth every 6 hours - scheduled; may also take 1 tablet (500 mg) every 4 hours as needed for headache, minor discomfort and fever 99.5-101 F - do not exceed 2000 mg/24 hours    [provider]  acetaminophen  (TYLENOL ) 500 MG tablet Take 500 mg by mouth every 4 (four) hours as needed for mild pain.    [provider]  alum & mag hydroxide-simeth (MAALOX/MYLANTA) 200-200-20 MG/5ML suspension Take 30 mLs by mouth  daily as needed for indigestion or heartburn.     [provider]  alum & mag hydroxide-simeth (MAALOX/MYLANTA) 200-200-20 MG/5ML suspension Take 30 mLs by mouth 3 (three) times daily as needed for indigestion or heartburn.    [provider]  atorvastatin  (LIPITOR) 40 MG tablet Take 40 mg by mouth at bedtime.     [provider]  atorvastatin  (LIPITOR) 40 MG tablet Take 40 mg by mouth daily. 07/29/19   [provider]  carboxymethylcellulose (REFRESH PLUS) 0.5 % SOLN Place 1 drop into both eyes 4 (four) times daily.    [provider]  Carboxymethylcellulose Sod PF 0.5 % SOLN Place 1 drop into both eyes 4 (four) times daily.    [provider]  carvedilol  (COREG ) 3.125 MG tablet Take 1 tablet (3.125 mg total) by mouth 2 (two) times daily with a meal. PLEASE START THE LOWER DOSE ON Monday, 7/30 DEPENDING ON HER HEART RATE. OK TO START IF HEART RATE IS NOT LESS THAN 65. Patient taking differently: Take 3.125 mg by mouth 2 (two) times daily with a meal.  10/07/16   Verdene Purchase, MD  carvedilol  (COREG ) 3.125 MG tablet Take 3.125 mg by mouth 2 (two) times daily. 07/29/19   [provider]  Cholecalciferol  (VITAMIN D ) 50 MCG (2000 UT) tablet Take 2,000 Units by mouth daily.    [provider]  Cranberry-Vitamin C-Probiotic (AZO CRANBERRY) 250-30 MG TABS Take 1 tablet by mouth daily.    [provider]  digoxin  (LANOXIN ) 0.125 MG tablet Take 0.125 mg by mouth every other  day. Hold if  pulse is less than 60    [provider]  digoxin  (LANOXIN ) 0.125 MG tablet Take 1 tablet by mouth every other day. 07/16/19   [provider]  diltiazem  (CARDIZEM  CD) 120 MG 24 hr capsule Take 120 mg daily by mouth.    [provider]  diltiazem  (CARDIZEM  CD) 120 MG 24 hr capsule Take 1 capsule by mouth daily. 07/29/19   [provider]  ergocalciferol  (VITAMIN D2) 1.25 MG (50000 UT) capsule Take 50,000 Units by  mouth daily.    [provider]  ergocalciferol  (VITAMIN D2) 1.25 MG (50000 UT) capsule Take 50,000 Units by mouth once a week.    [provider]  escitalopram  (LEXAPRO ) 10 MG tablet TAKE 1 TABLET(10 MG) BY MOUTH DAILY Patient taking differently: Take 10 mg by mouth daily.  09/29/17   Jame Maude FALCON, MD  escitalopram  (LEXAPRO ) 10 MG tablet Take 10 mg by mouth daily. 07/29/19   [provider]  furosemide  (LASIX ) 20 MG tablet Take 20 mg by mouth daily. 04/16/18   [provider]  furosemide  (LASIX ) 20 MG tablet Take 20 mg by mouth daily. 07/29/19   [provider]  gabapentin  (NEURONTIN ) 100 MG capsule Take 100 mg by mouth 3 (three) times daily.     [provider]  gabapentin  (NEURONTIN ) 100 MG capsule Take 100 mg by mouth 3 (three) times daily. Patient not taking: Reported on 02/24/2024 07/29/19   [provider]  guaifenesin  (ROBITUSSIN) 100 MG/5ML syrup Take 200 mg by mouth every 6 (six) hours as needed for cough.    [provider]  guaifenesin  (ROBITUSSIN) 100 MG/5ML syrup Take 200 mg by mouth every 6 (six) hours as needed for cough.    [provider]  Lidocaine -Glycerin (PREPARATION H EX) Place 1 application rectally daily as needed (Hemorhhoids).    [provider]  loperamide  (IMODIUM ) 2 MG capsule Take 2 mg by mouth as needed for diarrhea or loose stools (do not exceed 8 doses in 24 hours).     [provider]  loperamide  (IMODIUM ) 2 MG capsule Take 2 mg by mouth as needed for diarrhea or loose stools. Take 1 capsule with each loose stool as needed, not to exceed 8 doses in 24 hours.    [provider]  magnesium  hydroxide (MILK OF MAGNESIA) 400 MG/5ML suspension Take 30 mLs by mouth at bedtime as needed (constipation).     [provider]  magnesium  hydroxide (MILK OF MAGNESIA) 400 MG/5ML suspension Take 30 mLs by mouth at bedtime as needed for mild constipation.     [provider]  Melatonin 3 MG TABS Take 3 mg by mouth at bedtime.    [provider]  methocarbamol  (ROBAXIN ) 500 MG tablet Take 500 mg by mouth 2 (two) times daily as needed for muscle spasms.    [provider]  methocarbamol  (ROBAXIN ) 500 MG tablet Take 500 mg by mouth 2 (two) times daily as needed for muscle spasms.    [provider]  neomycin-bacitracin-polymyxin (NEOSPORIN) OINT Apply 1 application topically as needed (skin tears/abrasions).    [provider]  Neomycin-Bacitracin-Polymyxin (TRIPLE ANTIBIOTIC) [phone] OINT Apply 1 application topically daily as needed (Skin abrasions and tears.).    [provider]  ondansetron  (ZOFRAN ) 4 MG tablet Take 4 mg by mouth every 4 (four) hours as needed for nausea or vomiting.    [provider]  ondansetron  (ZOFRAN ) 4 MG tablet Take 4 mg by  mouth every 4 (four) hours as needed for nausea/vomiting. 07/16/19   [provider]  oxymetazoline  (AFRIN) 0.05 % nasal spray Place 1-2 sprays into both nostrils 3 (three) times daily as needed (nosebleeds).    [provider]  oxymetazoline  (AFRIN) 0.05 % nasal spray Place 1-2 sprays into both nostrils 3 (three) times daily as needed for congestion (Nose bleed).    [provider]  Pramox-PE-Glycerin-Petrolatum  (PREPARATION H) 1-0.25-14.4-15 % CREA Place 1 application rectally daily as needed (hemorrhoids).    [provider]  Rivaroxaban  (XARELTO ) 15 MG TABS tablet Take 1 tablet (15 mg total) by mouth daily. HOLD FOR ONE WEEK AND FOLLOW UP WITH pcp before resuming it. 06/18/19   Akula, Vijaya, MD  senna-docusate (SENOKOT-S) 8.6-50 MG tablet Take 1 tablet by mouth 2 (two) times daily.    [provider]  sennosides-docusate sodium  (SENOKOT-S) 8.6-50 MG tablet Take 1 tablet by mouth daily. Patient taking differently: Take 1 tablet by mouth 2 (two) times daily.  05/28/16   Kwiatkowski, Peter F, MD  sodium  chloride (OCEAN) 0.65 % SOLN nasal spray Place 2 sprays into both nostrils 4 (four) times daily.    [provider]  Sodium Fluoride  (PREVIDENT 5000 BOOSTER PLUS) 1.1 % PSTE Place 1 application onto teeth See admin instructions. Brush/floss normally, then use a smear of prevident on your toothbrush twice daily; do not eat/drink/rinse/ or use mouthwash after use    [provider]  traMADol  (ULTRAM ) 50 MG tablet Take 1 tablet (50 mg total) by mouth See admin instructions. Take 50 mg by mouth three times a day at 8:00 am, 14:00 and 22:00 Also take 50 mg by mouth every four hours as needed for sever pain not to exceed total 4/day Patient taking differently: Take 50 mg by mouth 4 (four) times daily.  10/05/16   Krishnan, Gokul, MD  traMADol  (ULTRAM ) 50 MG tablet Take 50 mg by mouth 4 (four) times daily.  07/26/19   [provider]  traZODone  (DESYREL ) 150 MG tablet Take 1 tablet (150 mg total) by mouth at bedtime. 10/05/16   Krishnan, Gokul, MD  traZODone  (DESYREL ) 150 MG tablet Take 150 mg by mouth at bedtime. 07/29/19   [provider]  triamcinolone  cream (KENALOG ) 0.1 % Apply 1 application topically 2 (two) times daily as needed for rash. Apply to rash on left forearm until cleared. 08/04/19   [provider]  XARELTO  15 MG TABS tablet Take 15 mg by mouth daily. 07/29/19   [provider]     Allergies: Codeine, Morphine  and codeine, Valium [diazepam], Sulfa antibiotics, Codeine, Levsin [hyoscyamine], Morphine  and codeine, Penicillins, Sulfa antibiotics, Valium [diazepam], Zantac [ranitidine hcl], Darvon [propoxyphene], Floxin [ofloxacin], Levsin [hyoscyamine sulfate], Penicillins, and Zantac [ranitidine hcl]   Review of Systems   ROS as per HPI  Physical Exam Updated Vital Signs BP (!) 120/52 (BP Location: Right Arm)   Pulse (!) 45   Temp 97.6 F (36.4 C) (Oral)   Resp 17   Ht 5' 6 (1.676 m)   Wt 77.1 kg   LMP 10/10/1969   SpO2 93%   BMI  27.44 kg/m  Physical Exam Vitals and nursing note reviewed.  Constitutional:      General: She is not in acute distress.    Appearance: She is well-developed.  HENT:     Head: Normocephalic and atraumatic.  Eyes:     Conjunctiva/sclera: Conjunctivae normal.  Cardiovascular:     Rate and Rhythm: Regular rhythm. Bradycardia present.  Heart sounds: No murmur heard. Pulmonary:     Effort: Pulmonary effort is normal. No respiratory distress.     Breath sounds: Normal breath sounds.  Abdominal:     Palpations: Abdomen is soft.     Tenderness: There is no abdominal tenderness.  Musculoskeletal:        General: No swelling.     Cervical back: Neck supple. No bony tenderness. No pain with movement.     Thoracic back: No bony tenderness.     Lumbar back: No bony tenderness.     Right lower leg: Edema present.     Left lower leg: Edema present.     Comments: Chest wall stable to AP and lateral compression, tenderness to palpation of the right greater than left chest wall.  Pelvis stable to AP and lateral compression, mild tenderness bilaterally.  Mild tenderness to palpation of knees bilaterally  Skin:    General: Skin is warm and dry.     Capillary Refill: Capillary refill takes less than 2 seconds.  Neurological:     Mental Status: She is alert.  Psychiatric:        Mood and Affect: Mood normal.     ED Course/ Medical Decision Making/ A&P    Procedures Procedures   Medications Ordered in ED Medications  furosemide  (LASIX ) injection 40 mg (40 mg Intravenous Given 02/24/24 0214)    Medical Decision Making:   Shaquana Buel is a 89 y.o. female who presents for fall, syncope as per above.  Physical exam is pertinent for tenderness palpation of right greater than left sided chest wall, bilateral hips, bilateral knees without gross deformity or instability of any of these areas.  Bradycardia.  1+ edema into the bilateral knees  The differential includes but is not limited to  symptomatic bradycardia, ACS, anemia, arrhythmia, heart failure exacerbation, ICH, fracture, dislocation, metabolic derangement.  Independent historian: EMS  External data reviewed: Labs: reviewed prior labs for baseline  Initial Plan:  Screening labs including CBC and Metabolic panel to evaluate for infectious or metabolic etiology of disease.  BNP to evaluate for heart failure exacerbation Magnesium  to evaluate for metabolic derangement contributing to bradycardia Urinalysis with reflex culture ordered to evaluate for UTI or relevant urologic/nephrologic pathology.  CT head, CT C-spine given fall at age 8 Screening chest x-ray, pelvic x-ray given fall at age 54, tenderness to these anatomic areas without gross deformity X-rays of bilateral knees given tenderness to palpation without gross deformity EKG and single troponin to evaluate for cardiac pathology. Single troponin appropriate due to greater than 6 hours since maximal intensity of symptoms. Objective evaluation as below reviewed   Labs: Ordered, Independent interpretation, and Details: BNP mildly elevated at 314. CBC without leukocytosis or thrombocytopenia.  Similar anemia on comparison to prior.  CMP with renal function at baseline.  Similar hypochloremia in comparison to prior.  No emergent electrolyte derangement or emergent LFT abnormality.  Magnesium  WNL.  Troponin 22, delta 18.  UA equivocal for UTI, in the absence of urinary symptoms or leukocytosis, will hold antibiotics at this time.  Radiology: Ordered, Independent interpretation, and All images reviewed independently.  Agree with radiology report at this time.   CT head: No ICH or displaced skull fracture CT C-spine: No displaced fracture or dislocation CXR: No acute cardiac or pulmonary abnormality. No appreciable rib fx. No PTX.  Pelvis XR: Pelvic ring intact. No acute fx. Both hips located.  Extremity XR's: Personally viewed x-rays of bilateral knees, do not  appreciate  displaced fracture or dislocation DG Knee 2 Views Right Result Date: 02/24/2024 EXAM: 1 or 2 VIEW(S) XRAY OF THE KNEE 02/24/2024 01:25:00 AM COMPARISON: None available. CLINICAL HISTORY: fall FINDINGS: BONES AND JOINTS: No acute fracture. No malalignment. No significant joint effusion. Sclerotic lesion within the proximal tibial metaphysis, likely bone island. Mild tricompartmental degenerative changes. SOFT TISSUES: Vascular calcifications. IMPRESSION: 1. No acute osseous injury. Electronically signed by: Oneil Devonshire MD 02/24/2024 01:56 AM EST RP Workstation: [id number]   DG Pelvis Portable Result Date: 02/24/2024 EXAM: 1 or 2 VIEW(S) XRAY OF THE PELVIS 02/24/2024 01:35:00 AM COMPARISON: 12/28/2023 CLINICAL HISTORY: fall FINDINGS: BONES AND JOINTS: No acute fracture. No malalignment. Degenerative changes of the visualized lower lumbar spine. SOFT TISSUES: The soft tissues are unremarkable. IMPRESSION: 1. No evidence of acute traumatic injury. 2. Degenerative changes of the visualized lower lumbar spine. Electronically signed by: Oneil Devonshire MD 02/24/2024 01:56 AM EST RP Workstation: [id number]   DG Chest Portable 1 View Result Date: 02/24/2024 EXAM: 1 VIEW(S) XRAY OF THE CHEST 02/24/2024 01:51:00 AM COMPARISON: 12/28/2023 CLINICAL HISTORY: fall FINDINGS: LUNGS AND PLEURA: Patchy opacities at lung bases, likely atelectasis or scarring. No pleural effusion. No pneumothorax. HEART AND MEDIASTINUM: Cardiomegaly. Aortic calcification. BONES AND SOFT TISSUES: No acute osseous abnormality. IMPRESSION: 1. Bibasilar patchy opacities, favored atelectasis or scarring. Electronically signed by: Oneil Devonshire MD 02/24/2024 01:55 AM EST RP Workstation: [id number]   DG Knee 2 Views Left Result Date: 02/24/2024 EXAM: 2 VIEW(S) XRAY OF THE LEFT KNEE 02/24/2024 01:25:00 AM COMPARISON: None available. CLINICAL HISTORY: fall FINDINGS: BONES AND JOINTS: No acute fracture. No malalignment. No significant joint  effusion. Mild tricompartmental degenerative changes. SOFT TISSUES: Vascular calcifications. IMPRESSION: 1. No evidence of acute traumatic injury. 2. Mild tricompartmental degenerative changes. Electronically signed by: Oneil Devonshire MD 02/24/2024 01:54 AM EST RP Workstation: [id number]   CT Cervical Spine Wo Contrast Result Date: 02/24/2024 EXAM: CT CERVICAL SPINE WITHOUT CONTRAST 02/24/2024 01:10:22 AM TECHNIQUE: CT of the cervical spine was performed without the administration of intravenous contrast. Multiplanar reformatted images are provided for review. Automated exposure control, iterative reconstruction, and/or weight based adjustment of the mA/kV was utilized to reduce the radiation dose to as low as reasonably achievable. COMPARISON: 12/28/2023 CLINICAL HISTORY: Neck trauma (Age >= 65y) FINDINGS: BONES AND ALIGNMENT: No acute fracture or traumatic malalignment. DEGENERATIVE CHANGES: Mild degenerative changes at C6-C7. SOFT TISSUES: No prevertebral soft tissue swelling. IMPRESSION: 1. No acute findings. Electronically signed by: Pinkie Pebbles MD 02/24/2024 01:18 AM EST RP Workstation: [id number]   CT Head Wo Contrast Result Date: 02/24/2024 EXAM: CT HEAD WITHOUT 02/24/2024 01:10:22 AM TECHNIQUE: CT of the head was performed without the administration of intravenous contrast. Automated exposure control, iterative reconstruction, and/or weight based adjustment of the mA/kV was utilized to reduce the radiation dose to as low as reasonably achievable. COMPARISON: 12/28/2023 CLINICAL HISTORY: Head trauma, minor (Age >= 65y) FINDINGS: BRAIN AND VENTRICLES: No acute intracranial hemorrhage. No mass effect or midline shift. No extra-axial fluid collection. No evidence of acute infarct. No hydrocephalus. Global cortical atrophy. Subcortical and periventricular small vessel ischemic changes. Intracranial atherosclerosis. ORBITS: No acute abnormality. SINUSES AND MASTOIDS: No acute abnormality. SOFT TISSUES  AND SKULL: No acute skull fracture. No acute soft tissue abnormality. IMPRESSION: 1. No acute intracranial abnormality. Electronically signed by: Pinkie Pebbles MD 02/24/2024 01:13 AM EST RP Workstation: [id number]    EKG/Medicine tests: Ordered and Independent interpretation EKG Interpretation: Atrial fibrillation LAD, consider left anterior fascicular block Anteroseptal infarct, old Nonspecific repol abnormality, diffuse leads  No significant change since last tracing Confirmed by Rogelia Satterfield ([id number]) on 02/24/2024 1:43:09 AM  Interventions: Lasix   See the EMR for full details regarding lab and imaging results.  Patient presents to the emergency department for fall and episode of syncope.  Patient complains of musculoskeletal pain to the hips, knees, chest wall, however does not complain of other pain, chest pain/pressure, shortness of breath.  She does not remember any specific symptoms preceding the falls.  Given patient's age, fall, patient does warrant imaging, as well as labs as per initial plan above.  Patient is initially bradycardic to the 40s to 50s on exam, does have a history of bradycardia as well as prior symptomatic bradycardia, patient is currently asymptomatic.  Labs and imaging obtained, patient with mildly elevated but downtrending troponins.  Mildly elevated BNP which correlates with patient's lower extremity edema, given dose of IV Lasix .  In the patient's chart, documented vitals have respiratory rate listed as 3.  Patient was reevaluated, patient had respiratory rate of 15 on my exam, therefore suspect that this was an erroneous error, meant to be a respiratory rate of 13.  Patient continues to saturate well on room air.  Patient did have intermittent bradycardia while in the emergency department, as low as heart rate in the 30s which I did personally see on the monitor which correlated with the patient's palpable pulse.  Given bradycardia and syncope reportedly witnessed  by staff at her facility, I do have concerns that patient had syncope due to symptomatic bradycardia.  I consulted cardiology, preliminarily spoke with Dr. Gail, who states that daytime team will perform full consult, however agreed with plan for admission to medicine, and stated that daytime cardiology team will evaluate patient and consider if she warrants intervention such as a possible leadless pacemaker.  Consulted medicine for admission, spoke with Dr. Charlton, who accepted the patient to his service.  No additional acute events while patient was under my care.  Presentation is most consistent with acute complicated illness and Current presentation is complicated by underlying chronic conditions  Discussion of management or test interpretations with external provider(s): Dr. Gail, cardiology, Dr. Charlton, hospitalists  Risk Drugs:Prescription drug management Treatment: Decision regarding hospitalization  Disposition: ADMIT: I believe the patient requires admission for further care and management. The patient was admitted to hospitalist with a cardiology consult. Please see inpatient provider note for additional treatment plan details.   MDM generated using voice dictation software and may contain dictation errors.  Please contact me for any clarification or with any questions.  Clinical Impression:  1. Syncope and collapse   2. Bradycardia      Admit   Final Clinical Impression(s) / ED Diagnoses Final diagnoses:  Syncope and collapse  Bradycardia    Rx / DC Orders ED Discharge Orders     None        Rogelia Satterfield RAMAN, MD 02/24/24 0600

## 2024-02-24 NOTE — Assessment & Plan Note (Signed)
 Monitoring closely, BP stable Carvedilol  and Cardizem  on hold due to bradycardia

## 2024-02-24 NOTE — ED Notes (Addendum)
 Patient's daughter, Jakeia Carreras [phone]), provided with an update with patient's permission.

## 2024-02-24 NOTE — ED Notes (Signed)
 PT has a HR of 55-67, Physician consulted on vitals and physician ok with giving

## 2024-02-24 NOTE — ED Notes (Addendum)
 Patient daughter, Aemilia Dedrick [phone]), provided with an update with patient permission.

## 2024-02-24 NOTE — Assessment & Plan Note (Signed)
 HFpEF No signs of exacerbation, mildly elevated BNP 314.6 -Continue Lasix  although holding today, to avoid any further dehydration - Last echo in our system 2018 EF 65-70%, A-fib, moderate LVH, grade 1 diastolic dysfunction - Repeating echo

## 2024-02-24 NOTE — Hospital Course (Signed)
 Tracy Rodriguez is a 89 year old female with past medical history of dementia, bipolar, A-fib, prior orthostatic + bradycardic syncopes, CKD 3B, HTN, HFpEF, CKD 3B, .... Presents after a syncopal episode  She was reportedly found down at her facility and then had a witnessed syncopal episode, and falls. . - Patient resides at [facility] house-patient was found by staff at the facility in her room unclear how long she had been down.  Staff reported upon evaluation patient had a syncopal episode.,  Stating she has bilateral knee pain, hip pain, right chest wall pain.  Mild shortness of breath. Upon evaluation denied having any chest pain, shortness of breath.  Denies any headaches neck pain or back pain. Upon arrival in ED patient's heart rate was still in 30s.  ED Evaluation: Blood pressure (!) 120/52, pulse (!) 45, temperature 97.6 F (36.4 C), temperature source Oral, resp. rate 17, height 5' 6 (1.676 m), weight 77.1 kg, last menstrual period 10/10/1969, SpO2 93%. LABs: Glucose 126 BUN 30 creatinine 1.65, calcium  8.7, BNP 314.6, troponin 22, 18,, hemoglobin 10.7, UA hazy, moderate hemoglobin, large leukocyte esterase, few bacteria, WBC 11-20 CT head, cervical spine -negative for any acute findings   Patient will be admitted for bradycardia induced syncope.

## 2024-02-24 NOTE — H&P (Signed)
 History and Physical   Patient: Tracy Rodriguez                            PCP: Pcp, No                    DOB: Jul 11, 1931            DOA: 02/24/2024 FMW:[id number]             DOS: 02/24/2024, 7:39 AM  Pcp, No  Patient coming from:   HOME  I have personally reviewed patient's medical records, in electronic medical records, including:  [facility], and care everywhere.    Chief Complaint:   Chief Complaint  Patient presents with   Loss of Consciousness    History of present illness:    Tracy Rodriguez is a 89 year old female with past medical history of dementia, bipolar, A-fib, prior orthostatic + bradycardic syncopes, CKD 3B, HTN, HFpEF, CKD 3B, .... Presents after a syncopal episode  She was reportedly found down at her facility and then had a witnessed syncopal episode, and falls. . - Patient resides at [facility] house-patient was found by staff at the facility in her room unclear how long she had been down.  Staff reported upon evaluation patient had a syncopal episode.,  Stating she has bilateral knee pain, hip pain, right chest wall pain.  Mild shortness of breath. Upon evaluation denied having any chest pain, shortness of breath.  Denies any headaches neck pain or back pain. Upon arrival in ED patient's heart rate was still in 30s.  ED Evaluation: Blood pressure (!) 120/52, pulse (!) 45, temperature 97.6 F (36.4 C), temperature source Oral, resp. rate 17, height 5' 6 (1.676 m), weight 77.1 kg, last menstrual period 10/10/1969, SpO2 93%. LABs: Glucose 126 BUN 30 creatinine 1.65, calcium  8.7, BNP 314.6, troponin 22, 18,, hemoglobin 10.7, UA hazy, moderate hemoglobin, large leukocyte esterase, few bacteria, WBC 11-20 CT head, cervical spine -negative for any acute findings   Patient will be admitted for bradycardia induced syncope.    Patient Denies having: Fever, Chills, Cough, SOB, Chest Pain, Abd pain, N/V/D, headache, dizziness, lightheadedness,  Dysuria, Joint  pain, rash, open wounds    Review of Systems: As per HPI, otherwise 10 point review of systems were negative.   ----------------------------------------------------------------------------------------------------------------------  Allergies[1]  Home MEDs:  Prior to Admission medications  Medication Sig Start Date End Date Taking? Authorizing Provider  acetaminophen  (TYLENOL ) 500 MG tablet Take 500 mg by mouth See admin instructions. Take one tablet (500 mg) by mouth every 6 hours - scheduled; may also take 1 tablet (500 mg) every 4 hours as needed for headache, minor discomfort and fever 99.5-101 F - do not exceed 2000 mg/24 hours    [provider]  alum & mag hydroxide-simeth (MAALOX/MYLANTA) 200-200-20 MG/5ML suspension Take 30 mLs by mouth daily as needed for indigestion or heartburn.     [provider]  atorvastatin  (LIPITOR) 40 MG tablet Take 40 mg by mouth at bedtime.     [provider]  carboxymethylcellulose (REFRESH PLUS) 0.5 % SOLN Place 1 drop into both eyes 4 (four) times daily.    [provider]  carvedilol  (COREG ) 3.125 MG tablet Take 3.125 mg by mouth 2 (two) times daily. 07/29/19   [provider]  Cholecalciferol  (VITAMIN D ) 50 MCG (2000 UT) tablet Take 2,000 Units by mouth daily.    [provider]  Cranberry-Vitamin C-Probiotic (AZO CRANBERRY) 250-30 MG TABS Take 1 tablet by mouth daily.    [provider]  digoxin  (LANOXIN ) 0.125 MG tablet Take 0.125 mg by mouth every other day. Hold if  pulse is less than 60    [provider]  diltiazem  (CARDIZEM  CD) 120 MG 24 hr capsule Take 120 mg daily by mouth.    [provider]  ergocalciferol  (VITAMIN D2) 1.25 MG (50000 UT) capsule Take 50,000 Units by mouth daily.    [provider]  escitalopram  (LEXAPRO ) 10 MG tablet Take 10 mg by mouth daily. 07/29/19   [provider]  furosemide  (LASIX ) 20 MG tablet Take 20 mg by mouth  daily. 04/16/18   [provider]  gabapentin  (NEURONTIN ) 100 MG capsule Take 100 mg by mouth 3 (three) times daily.     [provider]  guaifenesin  (ROBITUSSIN) 100 MG/5ML syrup Take 200 mg by mouth every 6 (six) hours as needed for cough.    [provider]  Lidocaine -Glycerin (PREPARATION H EX) Place 1 application rectally daily as needed (Hemorhhoids).    [provider]  loperamide  (IMODIUM ) 2 MG capsule Take 2 mg by mouth as needed for diarrhea or loose stools (do not exceed 8 doses in 24 hours).     [provider]  magnesium  hydroxide (MILK OF MAGNESIA) 400 MG/5ML suspension Take 30 mLs by mouth at bedtime as needed for mild constipation.    [provider]  Melatonin 3 MG TABS Take 3 mg by mouth at bedtime.    [provider]  methocarbamol  (ROBAXIN ) 500 MG tablet Take 500 mg by mouth 2 (two) times daily as needed for muscle spasms.    [provider]  neomycin-bacitracin-polymyxin (NEOSPORIN) OINT Apply 1 application topically as needed (skin tears/abrasions).    [provider]  ondansetron  (ZOFRAN ) 4 MG tablet Take 4 mg by mouth every 4 (four) hours as needed for nausea or vomiting.    [provider]  oxymetazoline  (AFRIN) 0.05 % nasal spray Place 1-2 sprays into both nostrils 3 (three) times daily as needed (nosebleeds).    [provider]  Pramox-PE-Glycerin-Petrolatum  (PREPARATION H) 1-0.25-14.4-15 % CREA Place 1 application rectally daily as needed (hemorrhoids).    [provider]  Rivaroxaban  (XARELTO ) 15 MG TABS tablet Take 1 tablet (15 mg total) by mouth daily. HOLD FOR ONE WEEK AND FOLLOW UP WITH pcp before resuming it. 06/18/19   Akula, Vijaya, MD  senna-docusate (SENOKOT-S) 8.6-50 MG tablet Take 1 tablet by mouth 2 (two) times daily.    [provider]  Sodium Fluoride  (PREVIDENT 5000 BOOSTER PLUS) 1.1 % PSTE Place 1 application onto teeth See admin instructions.  Brush/floss normally, then use a smear of prevident on your toothbrush twice daily; do not eat/drink/rinse/ or use mouthwash after use    [provider]  traMADol  (ULTRAM ) 50 MG tablet Take 50 mg by mouth 4 (four) times daily.  07/26/19   [provider]  traZODone  (DESYREL ) 150 MG tablet Take 1 tablet (150 mg total) by mouth at bedtime. 10/05/16   Krishnan, Gokul, MD  triamcinolone  cream (KENALOG ) 0.1 % Apply 1 application topically 2 (two) times daily as needed for rash. Apply to rash on left forearm until cleared. 08/04/19   [provider]    PRN MEDs: acetaminophen  **OR** acetaminophen , alum & mag hydroxide-simeth, bisacodyl , hydrALAZINE , HYDROmorphone  (DILAUDID ) injection, ipratropium, loperamide , melatonin, ondansetron  **OR** ondansetron  (ZOFRAN ) IV, oxyCODONE   Past Medical History:  Diagnosis Date  Arthritis    joints (08/04/2014)   Bipolar 1 disorder (HCC)    Cancer of right breast (HCC)    CHF (congestive heart failure) (HCC)    Chronic lower back pain    Hyperlipemia    Hypertension    PAF (paroxysmal atrial fibrillation) (HCC)     Past Surgical History:  Procedure Laterality Date   AUGMENTATION MAMMAPLASTY Bilateral    BACK SURGERY     BREAST BIOPSY Right    BREAST RECONSTRUCTION Bilateral    CATARACT EXTRACTION W/ INTRAOCULAR LENS  IMPLANT, BILATERAL Bilateral    EXCISIONAL HEMORRHOIDECTOMY     LUMBAR DISC SURGERY  1972; 1973; 1985   ruptured discs each time   MASTECTOMY Right    cancer   PLACEMENT OF BREAST IMPLANTS Bilateral    had to take tissue out of left   TONSILLECTOMY     VAGINAL HYSTERECTOMY       reports that she has never smoked. She has never used smokeless tobacco. She reports that she does not drink alcohol  and does not use drugs.   Family History  Problem Relation Age of Onset   Stroke Mother    Heart disease Father    Alzheimer's disease Sister    Leukemia Brother    Arthritis Maternal Grandmother    Breast  cancer Daughter     Physical Exam:   Vitals:   02/24/24 0330 02/24/24 0345 02/24/24 0400 02/24/24 0530  BP: 128/63 139/72 (!) 142/66 (!) 120/52  Pulse: (!) 52 62 82 (!) 45  Resp: 16 12 19 17   Temp:    97.6 F (36.4 C)  TempSrc:    Oral  SpO2: 94% 94% 96% 93%  Weight:      Height:       Constitutional: NAD, calm, comfortable Eyes: PERRL, lids and conjunctivae normal ENMT: Mucous membranes are moist. Posterior pharynx clear of any exudate or lesions.Normal dentition.  Neck: normal, supple, no masses, no thyromegaly Respiratory: clear to auscultation bilaterally, no wheezing, no crackles. Normal respiratory effort. No accessory muscle use.  Cardiovascular: Regular rate and rhythm, no murmurs / rubs / gallops. No extremity edema. 2+ pedal pulses. No carotid bruits.  Abdomen: no tenderness, no masses palpated. No hepatosplenomegaly. Bowel sounds positive.  Musculoskeletal: no clubbing / cyanosis. No joint deformity upper and lower extremities. Good ROM, no contractures. Normal muscle tone.  Neurologic: CN II-XII grossly intact. Sensation intact, DTR normal. Strength 5/5 in all 4.  Psychiatric: Normal judgment and insight. Alert and oriented x 3. Normal mood.  Skin: no rashes, lesions, ulcers. No induration Decubitus/ulcers:  Wounds: per nursing documentation         Labs on admission:    I have personally reviewed following labs and imaging studies  CBC: Recent Labs  Lab 02/24/24 0104  WBC 8.5  NEUTROABS 6.2  HGB 10.7*  HCT 34.1*  MCV 86.8  PLT 275   Basic Metabolic Panel: Recent Labs  Lab 02/24/24 0104  NA 137  K 3.5  CL 96*  CO2 31  GLUCOSE 126*  BUN 30*  CREATININE 1.65*  CALCIUM  8.7*  MG 2.0   GFR: Estimated Creatinine Clearance: 23.8 mL/min (A) (by C-G formula based on SCr of 1.65 mg/dL (H)). Liver Function Tests: Recent Labs  Lab 02/24/24 0104  AST 22  ALT 12  ALKPHOS 57  BILITOT 0.7  PROT 6.9  ALBUMIN 3.2*    Urine analysis:     Component Value Date/Time   COLORURINE YELLOW 02/24/2024 0344  APPEARANCEUR HAZY (A) 02/24/2024 0344   LABSPEC 1.004 (L) 02/24/2024 0344   PHURINE 6.0 02/24/2024 0344   GLUCOSEU NEGATIVE 02/24/2024 0344   HGBUR MODERATE (A) 02/24/2024 0344   BILIRUBINUR NEGATIVE 02/24/2024 0344   BILIRUBINUR neg 03/22/2016 1557   KETONESUR NEGATIVE 02/24/2024 0344   PROTEINUR NEGATIVE 02/24/2024 0344   UROBILINOGEN 0.2 03/22/2016 1557   UROBILINOGEN 0.2 08/04/2014 0657   NITRITE NEGATIVE 02/24/2024 0344   LEUKOCYTESUR LARGE (A) 02/24/2024 0344    Last A1C:  Lab Results  Component Value Date   HGBA1C 5.2 02/08/2016     Radiologic Exams on Admission:   DG Knee 2 Views Right Result Date: 02/24/2024 EXAM: 1 or 2 VIEW(S) XRAY OF THE KNEE 02/24/2024 01:25:00 AM COMPARISON: None available. CLINICAL HISTORY: fall FINDINGS: BONES AND JOINTS: No acute fracture. No malalignment. No significant joint effusion. Sclerotic lesion within the proximal tibial metaphysis, likely bone island. Mild tricompartmental degenerative changes. SOFT TISSUES: Vascular calcifications. IMPRESSION: 1. No acute osseous injury. Electronically signed by: Oneil Devonshire MD 02/24/2024 01:56 AM EST RP Workstation: [id number]   DG Pelvis Portable Result Date: 02/24/2024 EXAM: 1 or 2 VIEW(S) XRAY OF THE PELVIS 02/24/2024 01:35:00 AM COMPARISON: 12/28/2023 CLINICAL HISTORY: fall FINDINGS: BONES AND JOINTS: No acute fracture. No malalignment. Degenerative changes of the visualized lower lumbar spine. SOFT TISSUES: The soft tissues are unremarkable. IMPRESSION: 1. No evidence of acute traumatic injury. 2. Degenerative changes of the visualized lower lumbar spine. Electronically signed by: Oneil Devonshire MD 02/24/2024 01:56 AM EST RP Workstation: [id number]   DG Chest Portable 1 View Result Date: 02/24/2024 EXAM: 1 VIEW(S) XRAY OF THE CHEST 02/24/2024 01:51:00 AM COMPARISON: 12/28/2023 CLINICAL HISTORY: fall FINDINGS: LUNGS AND PLEURA: Patchy  opacities at lung bases, likely atelectasis or scarring. No pleural effusion. No pneumothorax. HEART AND MEDIASTINUM: Cardiomegaly. Aortic calcification. BONES AND SOFT TISSUES: No acute osseous abnormality. IMPRESSION: 1. Bibasilar patchy opacities, favored atelectasis or scarring. Electronically signed by: Oneil Devonshire MD 02/24/2024 01:55 AM EST RP Workstation: [id number]   DG Knee 2 Views Left Result Date: 02/24/2024 EXAM: 2 VIEW(S) XRAY OF THE LEFT KNEE 02/24/2024 01:25:00 AM COMPARISON: None available. CLINICAL HISTORY: fall FINDINGS: BONES AND JOINTS: No acute fracture. No malalignment. No significant joint effusion. Mild tricompartmental degenerative changes. SOFT TISSUES: Vascular calcifications. IMPRESSION: 1. No evidence of acute traumatic injury. 2. Mild tricompartmental degenerative changes. Electronically signed by: Oneil Devonshire MD 02/24/2024 01:54 AM EST RP Workstation: [id number]   CT Cervical Spine Wo Contrast Result Date: 02/24/2024 EXAM: CT CERVICAL SPINE WITHOUT CONTRAST 02/24/2024 01:10:22 AM TECHNIQUE: CT of the cervical spine was performed without the administration of intravenous contrast. Multiplanar reformatted images are provided for review. Automated exposure control, iterative reconstruction, and/or weight based adjustment of the mA/kV was utilized to reduce the radiation dose to as low as reasonably achievable. COMPARISON: 12/28/2023 CLINICAL HISTORY: Neck trauma (Age >= 65y) FINDINGS: BONES AND ALIGNMENT: No acute fracture or traumatic malalignment. DEGENERATIVE CHANGES: Mild degenerative changes at C6-C7. SOFT TISSUES: No prevertebral soft tissue swelling. IMPRESSION: 1. No acute findings. Electronically signed by: Pinkie Pebbles MD 02/24/2024 01:18 AM EST RP Workstation: [id number]   CT Head Wo Contrast Result Date: 02/24/2024 EXAM: CT HEAD WITHOUT 02/24/2024 01:10:22 AM TECHNIQUE: CT of the head was performed without the administration of intravenous contrast. Automated  exposure control, iterative reconstruction, and/or weight based adjustment of the mA/kV was utilized to reduce the radiation dose to as low as reasonably achievable. COMPARISON: 12/28/2023 CLINICAL HISTORY: Head trauma, minor (Age >= 65y) FINDINGS: BRAIN AND  VENTRICLES: No acute intracranial hemorrhage. No mass effect or midline shift. No extra-axial fluid collection. No evidence of acute infarct. No hydrocephalus. Global cortical atrophy. Subcortical and periventricular small vessel ischemic changes. Intracranial atherosclerosis. ORBITS: No acute abnormality. SINUSES AND MASTOIDS: No acute abnormality. SOFT TISSUES AND SKULL: No acute skull fracture. No acute soft tissue abnormality. IMPRESSION: 1. No acute intracranial abnormality. Electronically signed by: Pinkie Pebbles MD 02/24/2024 01:13 AM EST RP Workstation: [id number]    EKG:   Independently reviewed.  Orders placed or performed during the hospital encounter of 02/24/24   ED EKG   ED EKG   EKG 12-Lead   EKG 12-Lead   EKG 12-Lead   ---------------------------------------------------------------------------------------------------------------------------------------    Assessment / Plan:   Principal Problem:   Syncope Active Problems:   Fall   Acute renal failure superimposed on stage 3 chronic kidney disease (HCC)   Symptomatic bradycardia   Bipolar disorder (HCC)   CHF (congestive heart failure) (HCC)   Hypertension   Dementia (HCC)   Hyperlipemia   Physical deconditioning   Assessment and Plan: * Syncope Syncopal episode likely due to arrhythmia, bradycardia Close observation in ED heart rate ranges between 34-82 currently 45 -Home medication reviewed, beta-blockers, digoxin  and Cardizem  discontinued -Continue to monitor heart monitor closely -EDP consulted cardiology, appreciate further evaluation recommendations -Head imaging all reviewed with the negative -Troponin-no significant elevation,  -EKG : A-fib with  rate of 63 -Continue monitoring closely, -Checking orthostatic BP  - Continue neurochecks  Acute renal failure superimposed on stage 3 chronic kidney disease (HCC) Monitoring kidney function closely BUN/creatinine at baseline, GFR 29 Lab Results  Component Value Date   CREATININE 1.65 (H) 02/24/2024   CREATININE 1.60 (H) 12/28/2023   CREATININE 1.55 (H) 12/28/2023    Fall Generalized weaknesses, falls, with syncopal episodes -Fall precautions,  Hyperlipemia Continue statin -Although the risk versus benefit of statin needs to be evaluated at this age and comorbidities  Dementia (HCC) Currently awake alert oriented x 1, pleasantly confused In no acute distress  Hypertension Monitoring closely, BP stable Carvedilol  and Cardizem  on hold due to bradycardia  CHF (congestive heart failure) (HCC) HFpEF No signs of exacerbation, mildly elevated BNP 314.6 -Continue Lasix  although holding today, to avoid any further dehydration - Last echo in our system 2018 EF 65-70%, A-fib, moderate LVH, grade 1 diastolic dysfunction - Repeating echo  Atrial fibrillation with RVR (HCC) - Bradycardic, still A-fib-symptomatic with syncope - Withholding home medication of digoxin , Cardizem , carvedilol  -Continue Xarelto  -Cardiology consulted, appreciate further evaluation recommendation  Bipolar disorder (HCC) Mood stable -Reviewing home medication, anticipating continuing Lexapro , Neurontin ,  Physical deconditioning - Fall precaution, consulting PT OT for evaluation recommendation     Consults called: Cardiologist -------------------------------------------------------------------------------------------------------------------------------------------- DVT prophylaxis:  SCDs Start: 02/24/24 0705 Rivaroxaban  (XARELTO ) tablet 15 mg   Code Status:   Code Status: Full Code   Admission status: Patient will be admitted as Observation, with a greater than 2 midnight length of stay. Level  of care: Progressive   Family Communication:  none at bedside  (The above findings and plan of care has been discussed with patient in detail, the patient expressed understanding and agreement of above plan)  --------------------------------------------------------------------------------------------------------------------------------------------------  Disposition Plan:  Anticipated 1-2 days Status is: Observation The patient remains OBS appropriate and will d/c before 2 midnights.  ----------------------------------------------------------------------------------------------------------------------------------  Time spent:  85  Min.  Was spent seeing and evaluating the patient, reviewing all medical records, drawn plan of care.  SIGNED: Adriana DELENA Grams, MD, FHM. FAAFP. [facility], Pager  (Please use [email or web address] to page/ or secure chat through epic) If 7PM-7AM, please contact night-coverage [email or web address],  02/24/2024, 7:39 AM     [1]  Allergies Allergen Reactions   Codeine Anaphylaxis and Nausea And Vomiting    Note: pt has tramadol  for years with no reaction   Morphine  And Codeine Nausea And Vomiting    Severe nausea per daughter   Valium [Diazepam] Shortness Of Breath   Sulfa Antibiotics Itching and Rash   Codeine    Levsin [Hyoscyamine]    Morphine  And Codeine    Penicillins    Sulfa Antibiotics    Valium [Diazepam]    Zantac [Ranitidine Hcl]    Darvon [Propoxyphene] Nausea And Vomiting   Floxin [Ofloxacin] Itching   Levsin [Hyoscyamine Sulfate] Other (See Comments)    Unknown reaction   Penicillins Itching    Itching reported by [facility] Has patient had a PCN reaction causing immediate rash, facial/tongue/throat swelling, SOB or lightheadedness with hypotension: Yes Has patient had a PCN reaction causing severe rash involving mucus membranes or skin necrosis: No Has patient had a PCN reaction that required  hospitalization No Has patient had a PCN reaction occurring within the last 10 years: No If all of the above answers are NO, then may proceed with Cephalosporin use.    Zantac [Ranitidine Hcl] Other (See Comments)    Unknown reaction

## 2024-02-24 NOTE — ED Notes (Signed)
Pt returns from ECHO

## 2024-02-25 ENCOUNTER — Other Ambulatory Visit (HOSPITAL_COMMUNITY): Payer: Self-pay

## 2024-02-25 DIAGNOSIS — R001 Bradycardia, unspecified: Secondary | ICD-10-CM | POA: Diagnosis not present

## 2024-02-25 DIAGNOSIS — R55 Syncope and collapse: Secondary | ICD-10-CM | POA: Diagnosis not present

## 2024-02-25 LAB — BASIC METABOLIC PANEL WITH GFR
Anion gap: 8 (ref 5–15)
BUN: 20 mg/dL (ref 8–23)
CO2: 30 mmol/L (ref 22–32)
Calcium: 9.2 mg/dL (ref 8.9–10.3)
Chloride: 103 mmol/L (ref 98–111)
Creatinine, Ser: 1.24 mg/dL — ABNORMAL HIGH (ref 0.44–1.00)
GFR, Estimated: 41 mL/min — ABNORMAL LOW (ref >60)
Glucose, Bld: 103 mg/dL — ABNORMAL HIGH (ref 70–99)
Potassium: 3.4 mmol/L — ABNORMAL LOW (ref 3.5–5.1)
Sodium: 141 mmol/L (ref 135–145)

## 2024-02-25 LAB — CBG MONITORING, ED: Glucose-Capillary: 94 mg/dL (ref 70–99)

## 2024-02-25 LAB — CBC
HCT: 33.2 % — ABNORMAL LOW (ref 36.0–46.0)
Hemoglobin: 10.6 g/dL — ABNORMAL LOW (ref 12.0–15.0)
MCH: 27.6 pg (ref 26.0–34.0)
MCHC: 31.9 g/dL (ref 30.0–36.0)
MCV: 86.5 fL (ref 80.0–100.0)
Platelets: 261 K/uL (ref 150–400)
RBC: 3.84 MIL/uL — ABNORMAL LOW (ref 3.87–5.11)
RDW: 16.8 % — ABNORMAL HIGH (ref 11.5–15.5)
WBC: 8.8 K/uL (ref 4.0–10.5)
nRBC: 0 % (ref 0.0–0.2)

## 2024-02-25 LAB — APTT: aPTT: 40 s — ABNORMAL HIGH (ref 24–36)

## 2024-02-25 MED ORDER — LOPERAMIDE HCL 2 MG PO CAPS: 2.0000 mg | ORAL_CAPSULE | ORAL | Status: AC | PRN

## 2024-02-25 MED ORDER — CEPHALEXIN 250 MG PO CAPS: 500.0000 mg | ORAL_CAPSULE | Freq: Two times a day (BID) | ORAL | Status: DC

## 2024-02-25 MED ORDER — TRAMADOL HCL 50 MG PO TABS: 50.0000 mg | ORAL_TABLET | Freq: Four times a day (QID) | ORAL | Status: DC | PRN

## 2024-02-25 MED ORDER — CEPHALEXIN 250 MG PO CAPS
500.0000 mg | ORAL_CAPSULE | Freq: Two times a day (BID) | ORAL | Status: DC
  Administered 2024-02-25: 10:00:00 500 mg via ORAL
  Filled 2024-02-25: qty 2

## 2024-02-25 MED ORDER — POTASSIUM CHLORIDE 20 MEQ PO PACK
40.0000 meq | PACK | Freq: Once | ORAL | Status: AC
  Administered 2024-02-25: 11:00:00 40 meq via ORAL
  Filled 2024-02-25: qty 2

## 2024-02-25 MED ORDER — POTASSIUM CHLORIDE CRYS ER 20 MEQ PO TBCR: 40.0000 meq | EXTENDED_RELEASE_TABLET | Freq: Once | ORAL | Status: DC

## 2024-02-25 MED FILL — Cephalexin Cap 500 MG: 500.0000 mg | ORAL | 5 days supply | Qty: 10 | Fill #0 | Status: CN

## 2024-02-25 NOTE — Discharge Summary (Addendum)
 DISCHARGE SUMMARY  Tracy Rodriguez  MR#: [id number]  DOB:1931/11/11  Date of Admission: 02/24/2024 Date of Discharge: 02/25/2024  Attending Physician:Tracy No ONEIDA Moores, MD  Patient's PCP:Pcp, No  Disposition: return to [facility] memory care unit   Follow-up Appts:  Follow-up Information     PCP at your facility Follow up.   Why: As needed                Tests Needing Follow-up: -recheck K+ level in 3-5 days  -monitor volume status in patient of advanced age on high dose diuretic chronically  -f/u urine culture data pending at time of d/c from [facility]   Discharge Diagnoses: Bradycardia Syncope Found down Mild hypokalemia Abnormal UA - UTI POA AKI on CKD stage IIIb Normocytic anemia HLD Chronic diastolic CHF Dementia Bipolar disorder  Initial presentation: 89 year old [name] resident with a history of dementia, bipolar disorder, atrial fibrillation, prior episodes of bradycardia with syncope, CKD stage IIIb, HTN, diastolic CHF, and CKD stage IIIb who was found down at her facility and when being helped up had a witnessed syncopal episode with a fall.  Upon presentation she complained of bilateral knee pain hip pain and right chest wall pain but denied SSCP headache, neck pain, or significant SOB.  In the ER she was found to have a heart rate of 45 with blood pressure 120/52.  Her admission medication list included Coreg  Cardizem  and digoxin .   Hospital Course:  Bradycardia with syncope Heart rate range 34-82 during ER stay -digoxin  discontinued by cardiology -cautiously continuing Coreg  and diltiazem  for now -syncope likely due to a combination of transient digoxin  induced bradycardia as well as hypovolemia/mild dehydration in setting of diuretic use with acute UTI   Found down Suspected fall related to syncope -chest x-ray reveals bilateral atelectasis but no acute findings -plain films of the pelvis revealed no acute traumatic injury  -x-rays of bilateral knees reveal no acute traumatic injury - CT scan cervical spine and head without acute findings- stable at time of d/c   Mild hypokalemia Magnesium  normal -gently supplement - recheck in 3-5 days    Abnormal UA - acute UTI POA UA remarkable for large leukocytes with 11-20 WBC and few bacteria -urine culture presently growing greater than 100,000 colonies of gram-negative rods -given patient's dementia it is not clear if she is having urinary symptoms but in the setting of mild dehydration and syncope I feel it is prudent to treat her with a 5-day empiric antibiotic course -this will be further tailored to the results of her urine culture as available   AKI on CKD stage IIIb Baseline creatinine appears to be 1.2-1.35 -creatinine at presentation 1.65 with BUN elevated at 30 -creatinine has returned to baseline with gentle hydration   Normocytic anemia Likely due to CKD as well as advanced age   HLD Continue Lipitor   Chronic diastolic CHF No evidence of acute exacerbation -TTE this admission notes EF 65-70% with normal LV function and no regional WMA with inability to comment on diastolic function with no significant valvular abnormalities noted   Dementia Lives at [facility]   Bipolar disorder Continue Lexapro   Plan discussed with POA/daughter Tracy Rodriguez via telephone who agreed with plan for d/c today.   Allergies as of 02/25/2024       Reactions   Morphine  And Codeine Anaphylaxis, Nausea And Vomiting   Valium [diazepam] Shortness Of Breath   Sulfa Antibiotics Itching, Rash   Sulfones per SNF paperwork  Valium [diazepam] Other (See Comments)   Unknown reaction   Darvon [propoxyphene] Nausea And Vomiting   Floxin [ofloxacin] Itching   Levsin [hyoscyamine Sulfate] Other (See Comments)   Unknown reaction   Penicillins Itching   Zantac [ranitidine Hcl] Other (See Comments)   Unknown reaction        Medication List     STOP taking these medications     digoxin  0.125 MG tablet Commonly known as: LANOXIN    Geri-Tussin 100 MG/5ML liquid Generic drug: guaiFENesin        TAKE these medications    acetaminophen  500 MG tablet Commonly known as: TYLENOL  Take 500 mg by mouth every 6 (six) hours as needed (pain).   alum & mag hydroxide-simeth 400-400-40 MG/5ML suspension Commonly known as: MAALOX PLUS Take 30 mLs by mouth 3 (three) times daily as needed for indigestion (heartburn).   atorvastatin  10 MG tablet Commonly known as: LIPITOR Take 10 mg by mouth every evening.   carvedilol  6.25 MG tablet Commonly known as: COREG  Take 6.25 mg by mouth 2 (two) times daily.   cephALEXin  500 MG capsule Commonly known as: KEFLEX  Take 1 capsule (500 mg total) by mouth every 12 (twelve) hours for 5 days.   diltiazem  120 MG 24 hr capsule Commonly known as: TIAZAC  Take 120 mg by mouth daily.   ergocalciferol  1.25 MG (50000 UT) capsule Commonly known as: VITAMIN D2 Take 50,000 Units by mouth every Friday.   escitalopram  5 MG tablet Commonly known as: LEXAPRO  Take 5 mg by mouth daily.   gabapentin  100 MG capsule Commonly known as: NEURONTIN  Take 100 mg by mouth 3 (three) times daily.   loperamide  2 MG capsule Commonly known as: IMODIUM  Take 1 capsule (2 mg total) by mouth as needed for diarrhea or loose stools (do not exceed 8 doses in 24 hours).   loratadine 10 MG tablet Commonly known as: CLARITIN Take 10 mg by mouth daily.   melatonin 3 MG Tabs tablet Take 3 mg by mouth at bedtime.   mineral oil-hydrophilic petrolatum  ointment Apply 1 Application topically every Sunday. Apply 2g topically to bilateral lower legs every Wednesday.   Preparation H 1-0.25-14.4-15 % Crea Generic drug: Pramox-PE-Glycerin-Petrolatum  Place 1 application  rectally daily as needed (hemorrhoid flare-ups).   Rivaroxaban  15 MG Tabs tablet Commonly known as: XARELTO  Take 1 tablet (15 mg total) by mouth daily. HOLD FOR ONE WEEK AND FOLLOW UP WITH pcp  before resuming it. What changed:  when to take this additional instructions   senna-docusate 8.6-50 MG tablet Commonly known as: Senokot-S Take 1 tablet by mouth 2 (two) times daily.   torsemide 20 MG tablet Commonly known as: DEMADEX Take 80 mg by mouth daily.   traMADol  50 MG tablet Commonly known as: ULTRAM  Take 50 mg by mouth 3 (three) times daily.   traZODone  150 MG tablet Commonly known as: DESYREL  Take 1 tablet (150 mg total) by mouth at bedtime.        Day of Discharge BP (!) 150/64   Pulse 65   Temp 98.7 F (37.1 C) (Oral)   Resp 18   Ht 5' 6 (1.676 m)   Wt 77.1 kg   LMP 10/10/1969   SpO2 94%   BMI 27.44 kg/m   Physical Exam: General: No acute respiratory distress Lungs: Clear to auscultation bilaterally without wheezes or crackles Cardiovascular: Regular rate and rhythm without murmur gallop or rub normal S1 and S2 Abdomen: Nontender, nondistended, soft, bowel sounds positive, no rebound, no ascites, no appreciable  mass Extremities: No significant cyanosis, clubbing, or edema bilateral lower extremities  Basic Metabolic Panel: Recent Labs  Lab 02/24/24 0104 02/24/24 0301 02/25/24 0415  NA 137  --  141  K 3.5  --  3.4*  CL 96*  --  103  CO2 31  --  30  GLUCOSE 126*  --  103*  BUN 30*  --  20  CREATININE 1.65*  --  1.24*  CALCIUM  8.7*  --  9.2  MG 2.0  --   --   PHOS  --  3.6  --     CBC: Recent Labs  Lab 02/24/24 0104 02/25/24 0415  WBC 8.5 8.8  NEUTROABS 6.2  --   HGB 10.7* 10.6*  HCT 34.1* 33.2*  MCV 86.8 86.5  PLT 275 261    Recent Results (from the past 240 hours)  Urine Culture     Status: Abnormal (Preliminary result)   Collection Time: 02/24/24  3:44 AM   Specimen: Urine, Random  Result Value Ref Range Status   Specimen Description URINE, RANDOM  Final   Special Requests NONE Reflexed from [id number]  Final   Culture (A)  Final    >=100,000 COLONIES/mL GRAM NEGATIVE RODS SUSCEPTIBILITIES TO FOLLOW Performed at [facility], [address] [place], [place] [address]    Report Status PENDING  Incomplete      Time spent in discharge (includes decision making & examination of pt): 35 minutes  02/25/2024, 10:04 AM   Reyes IVAR Moores, MD [facility] Office  [phone]

## 2024-02-25 NOTE — ED Notes (Signed)
 [facility] called per RN verbal request, no eta given

## 2024-02-25 NOTE — Plan of Care (Signed)
 On review of the patient's chart it appears that she may very well be a candidate to return to her ALF today.  My intention is to see her early and hopefully get her discharged from the ER before she is transferred upstairs to an inpatient bed.  Tracy IVAR Moores, MD [facility] Office  [phone]

## 2024-02-26 ENCOUNTER — Telehealth: Payer: Self-pay | Admitting: *Deleted

## 2024-02-26 LAB — URINE CULTURE: Culture: >=100000 — AB

## 2024-02-26 NOTE — Telephone Encounter (Signed)
 [employer] Resident Coordinator called regarding change in medication.  RNCM faxed MD note with order to stop Digoxin .   Mirinda Monte J. Debarah, BSN, RN, [facility]  Inpatient Care Management  Nurse Case Manager  [facility] Emergency Departments  Operative Services  [phone]

## 2024-02-27 ENCOUNTER — Telehealth (HOSPITAL_BASED_OUTPATIENT_CLINIC_OR_DEPARTMENT_OTHER): Payer: Self-pay | Admitting: *Deleted

## 2024-02-27 NOTE — Telephone Encounter (Signed)
 Post ED Visit - Positive Culture Follow-up  Culture report reviewed by antimicrobial stewardship pharmacist: [facility] [x]  Vito Ralph, Pharm.D. []  Venetia Gully, Pharm.D., BCPS AQ-ID []  Garrel Crews, Pharm.D., BCPS []  Almarie Lunger, Pharm.D., BCPS []  [place], [address].D., BCPS, AAHIVP []  Rosaline Bihari, Pharm.D., BCPS, AAHIVP []  Vernell Meier, PharmD, BCPS []  Latanya Hint, PharmD, BCPS []  Donald Medley, PharmD, BCPS []  Rocky Bold, PharmD []  Dorothyann Alert, PharmD, BCPS []  Morene Babe, PharmD  [facility] []  Rosaline Edison, PharmD []  Romona Bliss, PharmD []  Dolphus Roller, PharmD []  Veva Seip, Rph []  Vernell Daunt) Leonce, PharmD []  Eva Allis, PharmD []  Rosaline Millet, PharmD []  Iantha Batch, PharmD []  Arvin Gauss, PharmD []  Wanda Hasting, PharmD []  Ronal Rav, PharmD []  Rocky Slade, PharmD []  Bard Jeans, PharmD   Positive urine culture Treated with cephalexin , organism sensitive to the same and no further patient follow-up is required at this time.  Tracy Rodriguez 02/27/2024, 9:08 AM
